# Patient Record
Sex: Male | Born: 1960 | Race: Black or African American | Hispanic: No | Marital: Single | Smoking: Current every day smoker
Health system: Southern US, Community
[De-identification: ages and names within clinical notes are randomized; demographics above are authoritative.]

## PROBLEM LIST (undated history)

## (undated) DIAGNOSIS — F1010 Alcohol abuse, uncomplicated: Secondary | ICD-10-CM

## (undated) DIAGNOSIS — K859 Acute pancreatitis without necrosis or infection, unspecified: Secondary | ICD-10-CM

## (undated) DIAGNOSIS — I729 Aneurysm of unspecified site: Secondary | ICD-10-CM

## (undated) DIAGNOSIS — I10 Essential (primary) hypertension: Secondary | ICD-10-CM

## (undated) DIAGNOSIS — K429 Umbilical hernia without obstruction or gangrene: Secondary | ICD-10-CM

## (undated) HISTORY — DX: Essential (primary) hypertension: I10

## (undated) HISTORY — DX: Aneurysm of unspecified site: I72.9

---

## 2007-08-28 ENCOUNTER — Emergency Department (HOSPITAL_COMMUNITY): Admission: EM | Admit: 2007-08-28 | Discharge: 2007-08-28 | Payer: Self-pay | Admitting: Emergency Medicine

## 2008-05-30 ENCOUNTER — Emergency Department (HOSPITAL_COMMUNITY): Admission: EM | Admit: 2008-05-30 | Discharge: 2008-05-30 | Payer: Self-pay | Admitting: Emergency Medicine

## 2012-10-06 ENCOUNTER — Ambulatory Visit (INDEPENDENT_AMBULATORY_CARE_PROVIDER_SITE_OTHER): Payer: Self-pay | Admitting: Nurse Practitioner

## 2012-10-06 ENCOUNTER — Encounter: Payer: Self-pay | Admitting: Nurse Practitioner

## 2012-10-06 VITALS — BP 170/110 | HR 80 | Temp 99.0°F | Resp 16 | Ht 69.5 in | Wt 172.0 lb

## 2012-10-06 DIAGNOSIS — I10 Essential (primary) hypertension: Secondary | ICD-10-CM

## 2012-10-06 DIAGNOSIS — R5381 Other malaise: Secondary | ICD-10-CM

## 2012-10-06 NOTE — Progress Notes (Signed)
Patient ID: Marco Pittman, male   DOB: 29-May-1960, 52 y.o.   MRN: [id number]   No Known Allergies  Chief Complaint  Patient presents with  . new patient    Patient feels hot when he eats and his nose has been bleeding    HPI: Patient is a 52 y.o. male  seen in the office today to establish care. Pt has no insurance and has no routine medical care but reports he has been feeling well recently and is now seeking medical attention.  Pt is unsure of any family medical history except for his father who died of colon cancer at age 7.  Reports he is getting really hot; today he was trying to eat a sandwich and he was sweating and could not finish eating After dinner last night he threw up; no nausea Fingers are numb; nose has been bleeding  Review of Systems:  Review of Systems  Constitutional: Positive for fever (has not taken temperture but he feels hot), chills, malaise/fatigue and diaphoresis (frequent episodes when he is outside in the heat). Negative for weight loss.  HENT: Positive for nosebleeds. Negative for ear pain, congestion, sore throat and ear discharge.   Eyes: Negative for blurred vision, pain and redness.  Respiratory: Negative for cough and shortness of breath.   Cardiovascular: Negative for chest pain, palpitations, claudication and leg swelling.  Gastrointestinal: Positive for vomiting (after meals), abdominal pain (after he eats) and diarrhea (occasional). Negative for heartburn, nausea, constipation, blood in stool and melena.  Genitourinary: Positive for urgency and frequency. Negative for dysuria.  Musculoskeletal: Positive for back pain. Negative for joint pain and falls.  Skin: Negative for itching and rash.  Neurological: Positive for tingling (numbness and tingling in fingers). Negative for dizziness, weakness and headaches.  Psychiatric/Behavioral: Negative for depression. The patient is not nervous/anxious and does not have insomnia.   ]  Past Medical  History  Diagnosis Date  . Hypertension   . Aneurysm    History reviewed. No pertinent past surgical history. Social History:   reports that he has been smoking Cigars.  He does not have any smokeless tobacco history on file. He reports that  drinks alcohol. He reports that he does not use illicit drugs.  Family History  Problem Relation Age of Onset  . Cancer Father 89    colon    Medications: Patient's Medications   No medications on file     Physical Exam:  Filed Vitals:   10/06/12 1301  BP: 170/110  Pulse: 80  Temp: 99 F (37.2 C)  TempSrc: Oral  Resp: 16  Height: 5' 9.5" (1.765 m)  Weight: 172 lb (78.019 kg)  SpO2: 99%    Physical Exam  Vitals reviewed. Constitutional: He is oriented to person, place, and time and well-developed, well-nourished, and in no distress. No distress.  HENT:  Head: Normocephalic and atraumatic.  Mouth/Throat: Oropharynx is clear and moist. No oropharyngeal exudate.  Eyes: Conjunctivae and EOM are normal. Pupils are equal, round, and reactive to light.  Neck: Normal range of motion. Neck supple. No JVD present. No tracheal deviation present. No thyromegaly present.  Cardiovascular: Normal rate, regular rhythm and normal heart sounds.   Pulmonary/Chest: Effort normal and breath sounds normal. No respiratory distress. He exhibits no tenderness.  Abdominal: Soft. Bowel sounds are normal. He exhibits no distension. There is no tenderness.  Genitourinary:  Declines rectal exam but is willing to do home FOBT  Musculoskeletal: Normal range of motion. He exhibits no  edema and no tenderness.  Lymphadenopathy:    He has no cervical adenopathy.  Neurological: He is alert and oriented to person, place, and time.  Skin: Skin is warm and dry. He is not diaphoretic.  Psychiatric: Affect normal.     Assessment/Plan   1.   Essential hypertension, benign 401.1     Blood pressure remains high on recheck- 164/ 98; EKG normal  Will get blood  work and start medication once blood work as been done   2.   Other malaise and fatigue   Encouraged a good routine; proper hydration when working in the heat  Will have pt do take home FOBT  Will get UA C&S, CBC with ciff, CMP, TSH

## 2012-10-07 LAB — COMPREHENSIVE METABOLIC PANEL
AST: 29 IU/L (ref 0–40)
Albumin/Globulin Ratio: 1.6 (ref 1.1–2.5)
Albumin: 4.6 g/dL (ref 3.5–5.5)
Alkaline Phosphatase: 64 IU/L (ref 39–117)
BUN/Creatinine Ratio: 7 — ABNORMAL LOW (ref 9–20)
BUN: 6 mg/dL (ref 6–24)
CO2: 25 mmol/L (ref 18–29)
Creatinine, Ser: 0.9 mg/dL (ref 0.76–1.27)
GFR calc Af Amer: 112 mL/min/{1.73_m2} (ref >59)
Globulin, Total: 2.8 g/dL (ref 1.5–4.5)
Sodium: 142 mmol/L (ref 134–144)
Total Bilirubin: 0.4 mg/dL (ref 0.0–1.2)

## 2012-10-07 LAB — URINALYSIS
Bilirubin, UA: NEGATIVE
Glucose, UA: NEGATIVE
Protein, UA: NEGATIVE
pH, UA: 6 (ref 5.0–7.5)

## 2012-10-07 LAB — CBC WITH DIFFERENTIAL
Eos: 0 % (ref 0–5)
HCT: 46.1 % (ref 37.5–51.0)
Immature Granulocytes: 0 % (ref 0–2)
Lymphocytes Absolute: 1.1 10*3/uL (ref 0.7–3.1)
MCH: 31.9 pg (ref 26.6–33.0)
MCV: 91 fL (ref 79–97)
Monocytes Absolute: 0.4 10*3/uL (ref 0.1–0.9)
Monocytes: 14 % — ABNORMAL HIGH (ref 4–12)
Neutrophils Absolute: 1.3 10*3/uL — ABNORMAL LOW (ref 1.4–7.0)
Platelets: 198 10*3/uL (ref 150–379)
RBC: 5.08 x10E6/uL (ref 4.14–5.80)
RDW: 14.5 % (ref 12.3–15.4)

## 2012-10-12 ENCOUNTER — Other Ambulatory Visit: Payer: Self-pay | Admitting: Nurse Practitioner

## 2012-10-12 MED ORDER — LOSARTAN POTASSIUM 50 MG PO TABS: 50.0000 mg | ORAL_TABLET | Freq: Every day | ORAL | Status: DC

## 2012-10-12 NOTE — Progress Notes (Signed)
Patient aware.

## 2012-10-14 ENCOUNTER — Other Ambulatory Visit: Payer: Self-pay

## 2012-10-14 ENCOUNTER — Other Ambulatory Visit: Payer: Self-pay | Admitting: *Deleted

## 2012-10-14 DIAGNOSIS — R5383 Other fatigue: Secondary | ICD-10-CM

## 2012-10-14 DIAGNOSIS — R5381 Other malaise: Secondary | ICD-10-CM

## 2012-10-14 DIAGNOSIS — Z800 Family history of malignant neoplasm of digestive organs: Secondary | ICD-10-CM

## 2012-10-14 DIAGNOSIS — I10 Essential (primary) hypertension: Secondary | ICD-10-CM

## 2012-10-16 LAB — FECAL OCCULT BLOOD, IMMUNOCHEMICAL: Fecal Occult Bld: NEGATIVE

## 2012-10-26 ENCOUNTER — Ambulatory Visit (INDEPENDENT_AMBULATORY_CARE_PROVIDER_SITE_OTHER): Payer: Self-pay | Admitting: Nurse Practitioner

## 2012-10-26 ENCOUNTER — Encounter: Payer: Self-pay | Admitting: Nurse Practitioner

## 2012-10-26 VITALS — BP 128/82 | HR 66 | Temp 97.4°F | Resp 18 | Ht 69.0 in | Wt 177.0 lb

## 2012-10-26 DIAGNOSIS — I10 Essential (primary) hypertension: Secondary | ICD-10-CM

## 2012-10-26 MED ORDER — LISINOPRIL 10 MG PO TABS: 10.0000 mg | ORAL_TABLET | Freq: Every day | ORAL | Status: DC

## 2012-10-26 NOTE — Patient Instructions (Addendum)
Due to cost changing to lisinopril ($4 at [facility])  Cont losartan until it is out then get lisinopril 10 mg daily   Check blood pressure- if remaining over 140/90 call us and let me know   Follow up when you get insurance ---if insurance has not went through by beginning of September please come back in for lab work

## 2012-10-26 NOTE — Progress Notes (Signed)
Patient ID: Marco Pittman, male   DOB: 1960/11/25, 52 y.o.   MRN: [id number]   No Known Allergies  Chief Complaint  Patient presents with  . Follow-up    BP check    HPI: Patient is a 52 y.o. male seen in the office today for follow up on blood pressure and fatigue. Pt reports he is doing much better since he has been on medication. Started losartan and has more energy. Currently without complaints at this time. No side effects noted from medication. 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  Eyes: Negative.   Respiratory: Negative for cough and shortness of breath.   Cardiovascular: Negative for chest pain and palpitations.  Gastrointestinal: Negative for heartburn, diarrhea and constipation.  Genitourinary: Negative for dysuria, urgency and frequency.  Musculoskeletal: Negative for myalgias and joint pain.  Skin: Negative.   Neurological: Negative for dizziness and headaches.  Psychiatric/Behavioral: Negative for depression. The patient is not nervous/anxious and does not have insomnia.      Past Medical History  Diagnosis Date  . Hypertension   . Aneurysm    History reviewed. No pertinent past surgical history. Social History:   reports that he has been smoking Cigars.  He does not have any smokeless tobacco history on file. He reports that  drinks alcohol. He reports that he does not use illicit drugs.  Family History  Problem Relation Age of Onset  . Cancer Father 52    colon    Medications: Patient's Medications  New Prescriptions   No medications on file  Previous Medications   LOSARTAN (COZAAR) 50 MG TABLET    Take 1 tablet (50 mg total) by mouth daily.  Modified Medications   No medications on file  Discontinued Medications   No medications on file     Physical Exam:  Filed Vitals:   10/26/12 0901  BP: 128/82  Pulse: 66  Temp: 97.4 F  (36.3 C)  TempSrc: Oral  Resp: 18  Height: 5\' 9"  (1.753 m)  Weight: 177 lb (80.287 kg)  SpO2: 95%    Physical Exam  Constitutional: He is oriented to person, place, and time and well-developed, well-nourished, and in no distress. No distress.  HENT:  Head: Normocephalic and atraumatic.  Eyes: Conjunctivae and EOM are normal. Pupils are equal, round, and reactive to light.  Neck: Normal range of motion. Neck supple.  Cardiovascular: Normal rate, regular rhythm and normal heart sounds.   Pulmonary/Chest: Effort normal and breath sounds normal. No respiratory distress.  Abdominal: Soft. Bowel sounds are normal. He exhibits no distension.  Musculoskeletal: Normal range of motion. He exhibits no edema and no tenderness.  Neurological: He is alert and oriented to person, place, and time.  Skin: Skin is warm and dry. He is not diaphoretic.  Psychiatric: Affect normal.     Labs reviewed: Basic Metabolic Panel:  Recent Labs  11/91/47 1452  NA 142  K 4.2  CL 102  CO2 25  GLUCOSE 87  BUN 6  CREATININE 0.90  CALCIUM 9.4  TSH 1.920   Liver Function Tests:  Recent Labs  10/06/12 1452  AST 29  ALT 21  ALKPHOS 64  BILITOT 0.4  PROT 7.4   No results found for this basename: LIPASE, AMYLASE,  in the last 8760 hours No results found for this basename: AMMONIA,  in the last 8760 hours CBC:  Recent Labs  10/06/12 1452  WBC 2.8*  NEUTROABS 1.3*  HGB 16.2  HCT 46.1  MCV 91  PLT 198     Assessment/Plan Hypertension- Improved- blood pressure log reviewed and blood pressures remaining below 140/90s. Will change medication due to cost to lisinopril 10 mg daily. He is to cont taking his blood pressure and call if blood pressure remaining high. Low sodium diet. pt to follow up when he gets insurance which he is currently in the process of getting, he will need recheck on BMP and fasting lipids.

## 2012-11-01 ENCOUNTER — Encounter: Payer: Self-pay | Admitting: Nurse Practitioner

## 2012-12-16 ENCOUNTER — Telehealth: Payer: Self-pay | Admitting: Nurse Practitioner

## 2012-12-16 NOTE — Telephone Encounter (Signed)
Marco Pittman came to the office on today with a friend.  He filled out a authorization to release information, says he had an appointment with Dr. Ewing Schlein- GI physician.  Marco Pittman says patient  requested the information and left.  We called the GI office, for fax #,  says pt does not have an appointment.  Says they told Marco Pittman, they do not take self referral,  he would need his PCP office to refer him.  I called Marco Pittman told him we are his PCP,asked if  He wanted  someone from our office to see him.  Marco Pittman gave the telephone to his friend, the friend says he does not want to come here,says  he has an appointment with another doctor.  cdavis

## 2012-12-21 ENCOUNTER — Ambulatory Visit: Payer: Self-pay | Attending: Family Medicine | Admitting: Internal Medicine

## 2012-12-21 VITALS — BP 148/112 | HR 80 | Temp 97.0°F | Resp 16 | Wt 168.0 lb

## 2012-12-21 DIAGNOSIS — R197 Diarrhea, unspecified: Secondary | ICD-10-CM | POA: Insufficient documentation

## 2012-12-21 DIAGNOSIS — R634 Abnormal weight loss: Secondary | ICD-10-CM | POA: Insufficient documentation

## 2012-12-21 DIAGNOSIS — I10 Essential (primary) hypertension: Secondary | ICD-10-CM | POA: Insufficient documentation

## 2012-12-21 DIAGNOSIS — R112 Nausea with vomiting, unspecified: Secondary | ICD-10-CM | POA: Insufficient documentation

## 2012-12-21 LAB — CBC WITH DIFFERENTIAL/PLATELET
Basophils Absolute: 0 10*3/uL (ref 0.0–0.1)
Basophils Relative: 1 % (ref 0–1)
Eosinophils Absolute: 0 10*3/uL (ref 0.0–0.7)
HCT: 42.6 % (ref 39.0–52.0)
Hemoglobin: 15.1 g/dL (ref 13.0–17.0)
MCH: 31.7 pg (ref 26.0–34.0)
MCHC: 35.4 g/dL (ref 30.0–36.0)
Monocytes Absolute: 0.5 10*3/uL (ref 0.1–1.0)
Monocytes Relative: 14 % — ABNORMAL HIGH (ref 3–12)
Neutrophils Relative %: 45 % (ref 43–77)
RDW: 14.6 % (ref 11.5–15.5)

## 2012-12-21 LAB — COMPREHENSIVE METABOLIC PANEL
ALT: 9 U/L (ref 0–53)
Albumin: 3.9 g/dL (ref 3.5–5.2)
CO2: 27 mEq/L (ref 19–32)
Chloride: 108 mEq/L (ref 96–112)
Glucose, Bld: 84 mg/dL (ref 70–99)
Potassium: 3.9 mEq/L (ref 3.5–5.3)
Sodium: 140 mEq/L (ref 135–145)
Total Bilirubin: 0.4 mg/dL (ref 0.3–1.2)
Total Protein: 6.5 g/dL (ref 6.0–8.3)

## 2012-12-21 LAB — LIPASE: Lipase: 79 U/L — ABNORMAL HIGH (ref 0–75)

## 2012-12-21 MED ORDER — PROMETHAZINE HCL 25 MG PO TABS: 25.0000 mg | ORAL_TABLET | Freq: Three times a day (TID) | ORAL | Status: DC | PRN

## 2012-12-21 NOTE — Progress Notes (Signed)
Patient states has not been feeling good for over a  Month Has been having hot and cold flashes Has lost 30 pounds in the past month Get a strange pain in his abd

## 2012-12-21 NOTE — Progress Notes (Signed)
Patient ID: Marco Pittman, male   DOB: November 12, 1960, 52 y.o.   MRN: [id number]  CC: Vomiting  HPI: Patient is 52 year old male with hypertension who presents to clinic with main concern of a few weeks duration of persistent nonbloody vomiting, poor oral intake, weight loss over 30 pounds in the past 3 months. He is not sure what exactly caused these symptoms, to him it appears that it started suddenly. He reports not being able to tolerate any by mouth intake and as soon as he then drinks water he throws up. He also reports intermittent episodes of nonbloody diarrhea. He denies any specific abdominal concerns such as abdominal distention, pain, but in stool and he also denies any specific urinary concerns. He has noted chills and night sweats but denies specific fevers. Patient also denies any known sick contacts or exposures, no recent sicknesses or hospitalizations, no traveling outside local area. Patient denies headaches, visual changes, no chest pain or shortness of breath, no specific focal neurological symptoms. Patient reports smoking over 30 years one pack per day. Patient denies alcohol and illicit drug use. He explains he was recently started on lisinopril and has been tolerating medicine well. He's not sure if there could be a side effect of this medicine that could cause the symptoms.  No Known Allergies  Past Medical History  Diagnosis Date  . Hypertension   . Aneurysm    Current Outpatient Prescriptions on File Prior to Visit  Medication Sig Dispense Refill  . lisinopril (PRINIVIL,ZESTRIL) 10 MG tablet Take 1 tablet (10 mg total) by mouth daily.  30 tablet  3   No current facility-administered medications on file prior to visit.   Family History  Problem Relation Age of Onset  . Cancer Father 25    colon   History   Social History  . Marital Status: Single    Spouse Name: N/A    Number of Children: N/A  . Years of Education: N/A   Occupational History  . Not on file.    Social History Main Topics  . Smoking status: Current Every Day Smoker -- 40 years    Types: Cigars  . Smokeless tobacco: Not on file  . Alcohol Use: 0.0 oz/week    2-3 Cans of beer per week  . Drug Use: No  . Sexual Activity: Yes   Other Topics Concern  . Not on file   Social History Narrative  . No narrative on file    Review of Systems  Constitutional: Negative for fever.  HENT: Negative for ear pain, nosebleeds, congestion, facial swelling, rhinorrhea, neck pain, neck stiffness and ear discharge.   Eyes: Negative for pain, discharge, redness, itching and visual disturbance.  Respiratory: Negative for cough, choking, chest tightness, shortness of breath, wheezing and stridor.   Cardiovascular: Negative for chest pain, palpitations and leg swelling.  Gastrointestinal: Negative for abdominal distention.  Genitourinary: Negative for dysuria, urgency, frequency, hematuria, flank pain, decreased urine volume, difficulty urinating and dyspareunia.  Musculoskeletal: Negative for back pain, joint swelling, arthralgias and gait problem.  Neurological: Negative for dizziness, tremors, seizures, syncope, facial asymmetry, speech difficulty, weakness, light-headedness, numbness and headaches.  Hematological: Negative for adenopathy. Does not bruise/bleed easily.  Psychiatric/Behavioral: Negative for hallucinations, behavioral problems, confusion, dysphoric mood, decreased concentration and agitation.    Objective:   Filed Vitals:   12/21/12 1404  BP: 148/112  Pulse: 80  Temp: 97 F (36.1 C)  Resp: 16    Physical Exam  Constitutional: Appears well-developed and  well-nourished. No distress.  HENT: Normocephalic. External right and left ear normal. Oropharynx is clear and moist.  Eyes: Conjunctivae and EOM are normal. PERRLA, no scleral icterus.  Neck: Normal ROM. Neck supple. No JVD. No tracheal deviation. No thyromegaly.  CVS: RRR, S1/S2 +, no murmurs, no gallops, no carotid  bruit.  Pulmonary: Effort and breath sounds normal, no stridor, rhonchi, wheezes, rales.  Abdominal: Soft. BS +,  no distension, tenderness, rebound or guarding.  Musculoskeletal: Normal range of motion. No edema and no tenderness.  Lymphadenopathy: No lymphadenopathy noted, cervical, inguinal. Neuro: Alert. Normal reflexes, muscle tone coordination. No cranial nerve deficit. Skin: Skin is warm and dry. No rash noted. Not diaphoretic. No erythema. No pallor.  Psychiatric: Normal mood and affect. Behavior, judgment, thought content normal.   Lab Results  Component Value Date   WBC 2.8* 10/06/2012   HGB 16.2 10/06/2012   HCT 46.1 10/06/2012   MCV 91 10/06/2012   PLT 198 10/06/2012   Lab Results  Component Value Date   CREATININE 0.90 10/06/2012   BUN 6 10/06/2012   NA 142 10/06/2012   K 4.2 10/06/2012   CL 102 10/06/2012   CO2 25 10/06/2012    No results found for this basename: HGBA1C   Lipid Panel  No results found for this basename: chol, trig, hdl, cholhdl, vldl, ldlcalc       Assessment and plan:   Patient Active Problem List   Diagnosis Date Noted  . Essential hypertension, benign 10/06/2012  - reasonable control, continuing the same regimen  Nausea and vomiting, weight loss - Unclear etiology but certainly worrisome especially given significant weight loss - Will place order for electrolyte panel, lipase, CBC, TSH - We'll also place order for CT of the abdomen and pelvis for further evaluation - Patient denies taking NSAIDs, alcohol or other illicit substance abuse, potential gastritis or gastric ulcer is not excluded - If the above tests are unremarkable will consider GI referral if the symptoms persist - We have also discussed possibility of viral gastroenteritis but the weight loss is to worrisome to consider that as the primary etiology

## 2012-12-21 NOTE — Patient Instructions (Signed)

## 2012-12-22 ENCOUNTER — Ambulatory Visit (HOSPITAL_COMMUNITY)
Admission: RE | Admit: 2012-12-22 | Discharge: 2012-12-22 | Disposition: A | Discharge status: Home / Self Care | Payer: Self-pay | Source: Ambulatory Visit | Attending: Internal Medicine | Admitting: Internal Medicine

## 2012-12-22 DIAGNOSIS — R1033 Periumbilical pain: Secondary | ICD-10-CM | POA: Insufficient documentation

## 2012-12-22 DIAGNOSIS — M47817 Spondylosis without myelopathy or radiculopathy, lumbosacral region: Secondary | ICD-10-CM | POA: Insufficient documentation

## 2012-12-22 DIAGNOSIS — K5730 Diverticulosis of large intestine without perforation or abscess without bleeding: Secondary | ICD-10-CM | POA: Insufficient documentation

## 2012-12-22 DIAGNOSIS — R634 Abnormal weight loss: Secondary | ICD-10-CM

## 2012-12-22 MED ORDER — IOHEXOL 300 MG/ML  SOLN
80.0000 mL | Freq: Once | INTRAMUSCULAR | Status: AC | PRN
  Administered 2012-12-22: 80 mL via INTRAVENOUS

## 2013-01-04 ENCOUNTER — Ambulatory Visit: Payer: Self-pay

## 2013-02-01 ENCOUNTER — Ambulatory Visit: Payer: Self-pay | Attending: Internal Medicine | Admitting: Internal Medicine

## 2013-02-01 VITALS — BP 114/73 | HR 80 | Temp 99.1°F | Resp 16 | Ht 69.5 in | Wt 169.0 lb

## 2013-02-01 DIAGNOSIS — R112 Nausea with vomiting, unspecified: Secondary | ICD-10-CM

## 2013-02-01 DIAGNOSIS — I10 Essential (primary) hypertension: Secondary | ICD-10-CM | POA: Insufficient documentation

## 2013-02-01 NOTE — Progress Notes (Signed)
Pt is here to follow up on his HTN. Pt is requesting to review his MRI results.

## 2013-02-01 NOTE — Patient Instructions (Signed)

## 2013-02-01 NOTE — Progress Notes (Signed)
Patient ID: Marco Pittman, male   DOB: 04-08-1960, 52 y.o.   MRN: [id number]  CC: follow up  HPI: 52 year old male with past medical history of hypertension who presented to clinic for follow up. He feels better today but still reports occasional nausea and vomiting. He has no abdominal pain at this time.  No Known Allergies Past Medical History  Diagnosis Date  . Hypertension   . Aneurysm    Current Outpatient Prescriptions on File Prior to Visit  Medication Sig Dispense Refill  . lisinopril (PRINIVIL,ZESTRIL) 10 MG tablet Take 1 tablet (10 mg total) by mouth daily.  30 tablet  3  . losartan (COZAAR) 50 MG tablet Take 50 mg by mouth daily.      . promethazine (PHENERGAN) 25 MG tablet Take 1 tablet (25 mg total) by mouth every 8 (eight) hours as needed for nausea.  65 tablet  1   No current facility-administered medications on file prior to visit.   Family History  Problem Relation Age of Onset  . Cancer Father 85    colon   History   Social History  . Marital Status: Single    Spouse Name: N/A    Number of Children: N/A  . Years of Education: N/A   Occupational History  . Not on file.   Social History Main Topics  . Smoking status: Current Every Day Smoker -- 40 years    Types: Cigars  . Smokeless tobacco: Not on file  . Alcohol Use: 0.0 oz/week    2-3 Cans of beer per week  . Drug Use: No  . Sexual Activity: Yes   Other Topics Concern  . Not on file   Social History Narrative  . No narrative on file    Review of Systems  Constitutional: Negative for fever, chills, diaphoresis, activity change, appetite change and fatigue.  HENT: Negative for ear pain, nosebleeds, congestion, facial swelling, rhinorrhea, neck pain, neck stiffness and ear discharge.   Eyes: Negative for pain, discharge, redness, itching and visual disturbance.  Respiratory: Negative for cough, choking, chest tightness, shortness of breath, wheezing and stridor.   Cardiovascular: Negative  for chest pain, palpitations and leg swelling.  Gastrointestinal: Negative for abdominal distention.  Genitourinary: Negative for dysuria, urgency, frequency, hematuria, flank pain, decreased urine volume, difficulty urinating and dyspareunia.  Musculoskeletal: Negative for back pain, joint swelling, arthralgias and gait problem.  Neurological: Negative for dizziness, tremors, seizures, syncope, facial asymmetry, speech difficulty, weakness, light-headedness, numbness and headaches.  Hematological: Negative for adenopathy. Does not bruise/bleed easily.  Psychiatric/Behavioral: Negative for hallucinations, behavioral problems, confusion, dysphoric mood, decreased concentration and agitation.    Objective:  There were no vitals filed for this visit.  Physical Exam  Constitutional: Appears well-developed and well-nourished. No distress.  HENT: Normocephalic. External right and left ear normal. Oropharynx is clear and moist.  Eyes: Conjunctivae and EOM are normal. PERRLA, no scleral icterus.  Neck: Normal ROM. Neck supple. No JVD. No tracheal deviation. No thyromegaly.  CVS: RRR, S1/S2 +, no murmurs, no gallops, no carotid bruit.  Pulmonary: Effort and breath sounds normal, no stridor, rhonchi, wheezes, rales.  Abdominal: Soft. BS +,  no distension, tenderness, rebound or guarding.  Musculoskeletal: Normal range of motion. No edema and no tenderness.  Lymphadenopathy: No lymphadenopathy noted, cervical, inguinal. Neuro: Alert. Normal reflexes, muscle tone coordination. No cranial nerve deficit. Skin: Skin is warm and dry. No rash noted. Not diaphoretic. No erythema. No pallor.  Psychiatric: Normal mood and affect.  Behavior, judgment, thought content normal.   Lab Results  Component Value Date   WBC 4.0 12/21/2012   HGB 15.1 12/21/2012   HCT 42.6 12/21/2012   MCV 89.5 12/21/2012   PLT 255 12/21/2012   Lab Results  Component Value Date   CREATININE 0.85 12/21/2012   BUN 7 12/21/2012   NA 140  12/21/2012   K 3.9 12/21/2012   CL 108 12/21/2012   CO2 27 12/21/2012    No results found for this basename: HGBA1C   Lipid Panel  No results found for this basename: chol, trig, hdl, cholhdl, vldl, ldlcalc       Assessment and plan:   Patient Active Problem List   Diagnosis Date Noted  . Essential hypertension, benign 10/06/2012    Priority: Medium - We have discussed target BP range - I have advised pt to check BP regularly and to call us back if the numbers are higher than 140/90 - discussed the importance of compliance with medical therapy and diet  - continue current medications and will see the pt in 1 month and if SBP above 135 will increase the dosages      Nausea or vomiting - phenergan PRN - GI referral provided

## 2013-02-10 ENCOUNTER — Other Ambulatory Visit: Payer: Self-pay | Admitting: Nurse Practitioner

## 2015-11-14 ENCOUNTER — Emergency Department (HOSPITAL_COMMUNITY)
Admission: EM | Admit: 2015-11-14 | Discharge: 2015-11-14 | Disposition: A | Discharge status: Home / Self Care | Payer: Medicaid Other | Attending: Emergency Medicine | Admitting: Emergency Medicine

## 2015-11-14 ENCOUNTER — Encounter (HOSPITAL_COMMUNITY): Payer: Self-pay | Admitting: Emergency Medicine

## 2015-11-14 DIAGNOSIS — I10 Essential (primary) hypertension: Secondary | ICD-10-CM | POA: Diagnosis not present

## 2015-11-14 DIAGNOSIS — R1033 Periumbilical pain: Secondary | ICD-10-CM | POA: Diagnosis present

## 2015-11-14 DIAGNOSIS — F17210 Nicotine dependence, cigarettes, uncomplicated: Secondary | ICD-10-CM | POA: Diagnosis not present

## 2015-11-14 DIAGNOSIS — K429 Umbilical hernia without obstruction or gangrene: Secondary | ICD-10-CM | POA: Diagnosis not present

## 2015-11-14 NOTE — ED Provider Notes (Signed)
[facility]-EMERGENCY DEPT Provider Note   CSN: [id number] Arrival date & time: 11/14/15  [id number]  First Provider Contact:  First MD Initiated Contact with Patient 11/14/15 [phone]        History   Chief Complaint Chief Complaint  Patient presents with  . Abdominal Pain    HPI Marco Pittman is a 55 y.o. male.  HPI Patient presents to the emergency department with complaints of intermittent periumbilical abdominal pain that lasts for 2 or 3 days and then resolve spontaneously on its own.  This is been occurring for several years.  He does report as a child he had a large umbilical hernia that was never repaired.  He reports that when his pain comes on it becomes nauseated and vomits and has no bowel movements and then usually 2 or 3 days of lying on his back and holding his stomach results in resolution of his symptoms.  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  He had a CT scan performed in 2014 which shows some abdominal wall diastases around his periumbilical region.  At that time there is no aneurysm of his aorta present   Past Medical History:  Diagnosis Date  . Aneurysm (HCC)   . Hypertension     Patient Active Problem List   Diagnosis Date Noted  . Nausea with vomiting 02/01/2013  . Essential hypertension, benign 10/06/2012    History reviewed. No pertinent surgical history.     Home Medications    Prior to Admission medications   Medication Sig Start Date End Date Taking? Authorizing Provider  losartan (COZAAR) 50 MG tablet Take 50 mg by mouth daily.    Historical Provider, MD    Family History Family History  Problem Relation Age of Onset  . Cancer Father [id number]    colon    Social History Social History  Substance Use Topics  . Smoking status: Current Every Day Smoker    Years: 40.00    Types: Cigars  . Smokeless tobacco: Never Used  . Alcohol use 0.0  oz/week    2 - 3 Cans of beer per week     Allergies   Review of patient's allergies indicates no known allergies.   Review of Systems Review of Systems  All other systems reviewed and are negative.    Physical Exam Updated Vital Signs BP 136/82 (BP Location: Right Arm)   Pulse 74   Temp 98.7 F (37.1 C) (Oral)   Resp 16   Ht 5\' 9"  (1.753 m)   Wt 165 lb (74.8 kg)   SpO2 98%   BMI 24.37 kg/m   Physical Exam  Constitutional: He is oriented to person, place, and time. He appears well-developed and well-nourished.  HENT:  Head: Normocephalic.  Eyes: EOM are normal.  Neck: Normal range of motion.  Pulmonary/Chest: Effort normal.  Abdominal: He exhibits no distension.  Easily reducible umbilical hernia  Musculoskeletal: Normal range of motion.  Neurological: He is alert and oriented to person, place, and time.  Psychiatric: He has a normal mood and affect.  Nursing note and vitals reviewed.    ED Treatments / Results  Labs (all labs ordered are listed, but only abnormal results are displayed) Labs Reviewed - No data to display  EKG  EKG Interpretation None       Radiology No results found.  Procedures Procedures (including critical care time)  Medications Ordered in ED Medications - No data to display   Initial Impression / Assessment and Plan / ED Course  I have reviewed the triage vital signs and the nursing notes.  Pertinent labs & imaging results that were available during my care of the patient were reviewed by me and considered in my medical decision making (see chart for details).  Clinical Course    Easily reducible umbilical hernia.  Asymptomatic at this time.  His last symptoms of abdominal pain for 2 days ago.  He'll be referred to [facility] surgery for possible repair of his umbilical hernia.  He understands return to the ER for new or worsening symptoms  Final Clinical Impressions(s) / ED Diagnoses   Final diagnoses:    Recurrent umbilical hernia    New Prescriptions New Prescriptions   No medications on file     Azalia Bilis, MD 11/14/15 1019

## 2015-11-14 NOTE — ED Notes (Signed)
Pt states he ate bologna this am for breakfast.

## 2015-11-14 NOTE — ED Triage Notes (Signed)
Pt c/o pain around umbilicus-- intermittent-- started last week and has been constant-- last bowel movement yesterday- no vomiting.

## 2016-03-04 ENCOUNTER — Inpatient Hospital Stay (HOSPITAL_COMMUNITY)
Admission: EM | Admit: 2016-03-04 | Discharge: 2016-03-05 | DRG: 438 | Disposition: A | Discharge status: Home / Self Care | Payer: Medicaid Other | Attending: Oncology | Admitting: Oncology

## 2016-03-04 ENCOUNTER — Encounter: Payer: Self-pay | Admitting: General Surgery

## 2016-03-04 ENCOUNTER — Encounter (HOSPITAL_COMMUNITY): Payer: Self-pay | Admitting: *Deleted

## 2016-03-04 ENCOUNTER — Emergency Department (HOSPITAL_COMMUNITY): Payer: Medicaid Other

## 2016-03-04 ENCOUNTER — Inpatient Hospital Stay (HOSPITAL_COMMUNITY): Payer: Medicaid Other

## 2016-03-04 DIAGNOSIS — F1020 Alcohol dependence, uncomplicated: Secondary | ICD-10-CM

## 2016-03-04 DIAGNOSIS — K859 Acute pancreatitis without necrosis or infection, unspecified: Secondary | ICD-10-CM

## 2016-03-04 DIAGNOSIS — K861 Other chronic pancreatitis: Secondary | ICD-10-CM | POA: Diagnosis present

## 2016-03-04 DIAGNOSIS — F17290 Nicotine dependence, other tobacco product, uncomplicated: Secondary | ICD-10-CM | POA: Diagnosis present

## 2016-03-04 DIAGNOSIS — I10 Essential (primary) hypertension: Secondary | ICD-10-CM | POA: Diagnosis present

## 2016-03-04 DIAGNOSIS — K852 Alcohol induced acute pancreatitis without necrosis or infection: Secondary | ICD-10-CM | POA: Diagnosis not present

## 2016-03-04 DIAGNOSIS — E43 Unspecified severe protein-calorie malnutrition: Secondary | ICD-10-CM | POA: Insufficient documentation

## 2016-03-04 DIAGNOSIS — Z6823 Body mass index (BMI) 23.0-23.9, adult: Secondary | ICD-10-CM | POA: Diagnosis not present

## 2016-03-04 DIAGNOSIS — Z79899 Other long term (current) drug therapy: Secondary | ICD-10-CM

## 2016-03-04 DIAGNOSIS — K8681 Exocrine pancreatic insufficiency: Secondary | ICD-10-CM | POA: Diagnosis not present

## 2016-03-04 DIAGNOSIS — F1010 Alcohol abuse, uncomplicated: Secondary | ICD-10-CM | POA: Diagnosis present

## 2016-03-04 DIAGNOSIS — F17210 Nicotine dependence, cigarettes, uncomplicated: Secondary | ICD-10-CM

## 2016-03-04 DIAGNOSIS — K860 Alcohol-induced chronic pancreatitis: Secondary | ICD-10-CM

## 2016-03-04 DIAGNOSIS — Z800 Family history of malignant neoplasm of digestive organs: Secondary | ICD-10-CM

## 2016-03-04 DIAGNOSIS — K429 Umbilical hernia without obstruction or gangrene: Secondary | ICD-10-CM | POA: Diagnosis present

## 2016-03-04 HISTORY — DX: Acute pancreatitis without necrosis or infection, unspecified: K85.90

## 2016-03-04 HISTORY — DX: Umbilical hernia without obstruction or gangrene: K42.9

## 2016-03-04 LAB — URINALYSIS, ROUTINE W REFLEX MICROSCOPIC
Glucose, UA: 100 mg/dL — AB
HGB URINE DIPSTICK: NEGATIVE
KETONES UR: 15 mg/dL — AB
NITRITE: POSITIVE — AB
PH: 5.5 (ref 5.0–8.0)
Protein, ur: 30 mg/dL — AB
Specific Gravity, Urine: 1.024 (ref 1.005–1.030)

## 2016-03-04 LAB — COMPREHENSIVE METABOLIC PANEL
ALBUMIN: 4.1 g/dL (ref 3.5–5.0)
ALK PHOS: 90 U/L (ref 38–126)
ALT: 15 U/L — ABNORMAL LOW (ref 17–63)
AST: 19 U/L (ref 15–41)
Anion gap: 10 (ref 5–15)
BILIRUBIN TOTAL: 0.6 mg/dL (ref 0.3–1.2)
BUN: 9 mg/dL (ref 6–20)
CALCIUM: 9.8 mg/dL (ref 8.9–10.3)
CO2: 23 mmol/L (ref 22–32)
Chloride: 104 mmol/L (ref 101–111)
Creatinine, Ser: 0.93 mg/dL (ref 0.61–1.24)
GFR calc Af Amer: >60 mL/min (ref >60)
GFR calc non Af Amer: >60 mL/min (ref >60)
GLUCOSE: 127 mg/dL — AB (ref 65–99)
POTASSIUM: 4.3 mmol/L (ref 3.5–5.1)
Sodium: 137 mmol/L (ref 135–145)
TOTAL PROTEIN: 7.8 g/dL (ref 6.5–8.1)

## 2016-03-04 LAB — CBC
HEMATOCRIT: 50.2 % (ref 39.0–52.0)
Hemoglobin: 18.3 g/dL — ABNORMAL HIGH (ref 13.0–17.0)
MCH: 31.7 pg (ref 26.0–34.0)
MCHC: 36.5 g/dL — AB (ref 30.0–36.0)
MCV: 86.9 fL (ref 78.0–100.0)
Platelets: 160 10*3/uL (ref 150–400)
RBC: 5.78 MIL/uL (ref 4.22–5.81)
RDW: 13.4 % (ref 11.5–15.5)
WBC: 5.5 10*3/uL (ref 4.0–10.5)

## 2016-03-04 LAB — URINE MICROSCOPIC-ADD ON

## 2016-03-04 LAB — ETHANOL: Alcohol, Ethyl (B): <5 mg/dL (ref <5)

## 2016-03-04 LAB — TSH: TSH: 1.806 u[IU]/mL (ref 0.350–4.500)

## 2016-03-04 LAB — LIPASE, BLOOD: Lipase: 472 U/L — ABNORMAL HIGH (ref 11–51)

## 2016-03-04 MED ORDER — ONDANSETRON HCL 4 MG/2ML IJ SOLN
4.0000 mg | Freq: Four times a day (QID) | INTRAMUSCULAR | Status: DC | PRN
  Administered 2016-03-04 – 2016-03-05 (×2): 4 mg via INTRAVENOUS
  Filled 2016-03-04 (×2): qty 2

## 2016-03-04 MED ORDER — DOCUSATE SODIUM 100 MG PO CAPS
100.0000 mg | ORAL_CAPSULE | Freq: Two times a day (BID) | ORAL | Status: DC
  Administered 2016-03-04 – 2016-03-05 (×2): 100 mg via ORAL
  Filled 2016-03-04 (×2): qty 1

## 2016-03-04 MED ORDER — MORPHINE SULFATE (PF) 4 MG/ML IV SOLN
4.0000 mg | Freq: Once | INTRAVENOUS | Status: AC
  Administered 2016-03-04: 4 mg via INTRAVENOUS
  Filled 2016-03-04: qty 1

## 2016-03-04 MED ORDER — ACETAMINOPHEN 325 MG PO TABS: 650.0000 mg | ORAL_TABLET | Freq: Four times a day (QID) | ORAL | Status: DC | PRN

## 2016-03-04 MED ORDER — LACTATED RINGERS IV SOLN
INTRAVENOUS | Status: DC
  Administered 2016-03-04: 1000 mL via INTRAVENOUS

## 2016-03-04 MED ORDER — ONDANSETRON HCL 4 MG PO TABS
4.0000 mg | ORAL_TABLET | Freq: Four times a day (QID) | ORAL | Status: DC | PRN
Start: 2016-03-04 — End: 2016-03-05

## 2016-03-04 MED ORDER — ACETAMINOPHEN 650 MG RE SUPP: 650.0000 mg | Freq: Four times a day (QID) | RECTAL | Status: DC | PRN

## 2016-03-04 MED ORDER — SODIUM CHLORIDE 0.9 % IV BOLUS (SEPSIS)
1000.0000 mL | Freq: Once | INTRAVENOUS | Status: AC
  Administered 2016-03-04: 1000 mL via INTRAVENOUS

## 2016-03-04 MED ORDER — HYDROMORPHONE HCL 1 MG/ML IJ SOLN
1.0000 mg | INTRAMUSCULAR | Status: DC | PRN
  Administered 2016-03-04 – 2016-03-05 (×2): 1 mg via INTRAVENOUS
  Filled 2016-03-04 (×2): qty 1

## 2016-03-04 MED ORDER — SODIUM CHLORIDE 0.9% FLUSH
3.0000 mL | Freq: Two times a day (BID) | INTRAVENOUS | Status: DC
  Administered 2016-03-04 – 2016-03-05 (×2): 3 mL via INTRAVENOUS

## 2016-03-04 MED ORDER — HEPARIN SODIUM (PORCINE) 5000 UNIT/ML IJ SOLN
5000.0000 [IU] | Freq: Three times a day (TID) | INTRAMUSCULAR | Status: DC
  Administered 2016-03-04 – 2016-03-05 (×2): 5000 [IU] via SUBCUTANEOUS
  Filled 2016-03-04: qty 1

## 2016-03-04 MED ORDER — ONDANSETRON HCL 4 MG/2ML IJ SOLN: 4.0000 mg | Freq: Three times a day (TID) | INTRAMUSCULAR | Status: DC | PRN

## 2016-03-04 MED ORDER — ONDANSETRON HCL 4 MG/2ML IJ SOLN
4.0000 mg | Freq: Once | INTRAMUSCULAR | Status: AC
  Administered 2016-03-04: 4 mg via INTRAVENOUS
  Filled 2016-03-04: qty 2

## 2016-03-04 MED ORDER — IOPAMIDOL (ISOVUE-300) INJECTION 61%
INTRAVENOUS | Status: AC
  Administered 2016-03-04: 100 mL
  Filled 2016-03-04: qty 100

## 2016-03-04 NOTE — H&P (Signed)
Date: 03/04/2016               Patient Name:  Marco Pittman MRN: [id number]  DOB: 10/12/1960 Age / Sex: 55 y.o., male   PCP: Quentin Angstlugbemiga E Jegede, MD              Medical Service: Internal Medicine Teaching Service              Attending Physician: Dr. Marily MemosJason Mesner, MD    First Contact: Dannial MonarchEJ Sisto Granillo, MS 3 Pager: [phone]  Second Contact: Dr. Laural BenesJohnson Pager: [phone]  Third Contact Dr. Johnny BridgeSaraiya Pager: [phone]       After Hours (After 5p/  First Contact Pager: [phone]  weekends / holidays): Second Contact Pager: [phone]   Chief Complaint: "abdominal pain"  History of Present Illness: Marco Pittman, Marco Pittman is 55 yo M with a PMH ETOH use and HTN presented to the ED with abdominal pain and vomiting for the past 4 days. The patient was in his usual health until 4 days ago when he started vomiting. The patient denies any blood or red vomit and states it is usually white. He has not been able to put any food or drink down since then. The patient endorses new abdominal pain around his umbilicus and in the lower regions of his belly. He says the pain is fluctuant but constant and not associated with eating. Currently he is at 0/10 pain after the pain medication. He denies any radiation or movement of the pain and says it is not worsened with movement. He denies any chest pain or back pain. He says this occurred once before where he was hospitalized in the 1970s. He said it was due to his hernia. Since then he has had little pain, except over the past months where he would experience pain now and then for about 2 days before it subsided. The patient says he has had less of an appetite recently and endorsed a recent weight loss. He also states he has less muscle tone then he used to. The patient denies any dysuria or changes in his bowels. But states his urine has been orange for the past few days. The patient also denies any recent illness but has had fevers and chills for this time period as well. The patient  denied any recent trauma, surgery, history of ulcer disease or gallstones. He endorses using tylenol occasionally for shoulder pain. He states he drinks about 2 24 ounce beers a day and smokes 3 cigars per week. He has been generally healthy and was only diagnosed with HTN which he hadn't been taking his medications for. He has no history of atherosclerotic disease but says he brother has had an MI. His father and his cousin had colon cancer. No colonoscopy hx. He has no family history of autoimmune disease.   In the Ed, the patient had a HR 69, BP 138/99, T98.4, Rr16 and O2 99%RA. Lipase was elevated to 472. CTA showed acute on chronic pancreatitis. He received 1 L Ns, ondansetron and morphine and was transferred to the teaching service.   Meds: No current facility-administered medications for this encounter.    Current Outpatient Prescriptions  Medication Sig Dispense Refill  . acetaminophen (TYLENOL) 325 MG tablet Take 650 mg by mouth every 6 (six) hours as needed for mild pain.      Allergies: Allergies as of 03/04/2016  . (No Known Allergies)   Past Medical History:  Diagnosis Date  . Aneurysm (HCC)   . Hypertension  History reviewed. No pertinent surgical history. Family History  Problem Relation Age of Onset  . Cancer Father 4562    colon   Social History   Social History  . Marital status: Single    Spouse name: N/A  . Number of children: N/A  . Years of education: N/A   Occupational History  . Not on file.   Social History Main Topics  . Smoking status: Current Every Day Smoker    Years: 40.00    Types: Cigars  . Smokeless tobacco: Never Used  . Alcohol use 0.0 oz/week    2 - 3 Cans of beer per week  . Drug use: No  . Sexual activity: Yes   Other Topics Concern  . Not on file   Social History Narrative  . No narrative on file    Review of Systems: Constitutional: positive for fatigue, fever chills and weight loss  Ears, nose, mouth, throat, and face:  negative for changes in vision or congestion or sore throat Respiratory: negative for SOB or cough Cardiovascular: negative for chest pain or palpitations Gastrointestinal: Per HPI Genitourinary:Negative for dysuria Hematologic/lymphatic: Denies easy brusising Musculoskeletal:Positive for joint pain of the left shoulder. Denies any other pains Neurological: Negative for headaches Endocrine: positive for weight change Allergic/Immunologic: Negative for allergies or autoimmune diseases  Physical Exam: Blood pressure 145/98, pulse 76, temperature 98 F (36.7 C), resp. rate 18, height 5' 9.5" (1.765 m), weight 70.4 kg (155 lb 5 oz), SpO2 99 %. Gen: Patient is well appearing, lying in bed, fully conversant with team. HEENT: normocephalic and atraumatic. Pupils are equally round and reactive to light. Moist mucus membranes. Anicteric. No oral lesions present. Tongue is normal size with no lesions. Tyroid is unilateral to the right side.  Lymphatics: no lymphadenopathy present posterior cervical, posterior auricular, occipital, periauricular, parotid submental or submandibular. No axillary, supraclavicular or inguinal lymphadenopathy present.  Cv: No sternal scars present. Regular Rate and rhythm. NL s1 s2 with no heaves rubs or murmurs.  Vascular:. Radial, dorsalis pedis and posterior tibial pulses 2+.  Resp: No scars present on back. Normal work of breathing. Clear to auscultation bilaterally. Tympanic percussion throughout.  Abdomen: No abdominal scars present. Umbilical hernia protruding 1 cm. Non distended. Decreased bowel sounds. Tender to palpitation in all four quadrants. Worse with rebound. Positive McMurphy's sign. No cullen's sign. Symmetric percussion throughout. No organomegaly or masses.  Neuro: Oriented x3. Normal visual acuity. EOM intact. Facial motor symmetry. Facial sensation is symmetric. Uvula and tongue are midline. Grossly normal strength throughout.  Psych: normal mood and  affect.  Ext: Warm extremities. No LE edema. No cyanosis or clubbing. No rashes present. MSK: No obvious deformed joints.  Lab results: CBC wnl. CMP wnl. Lipase 472. UA positive for hyaline casts, nitrites and glucose.   Imaging results:  CTA - Changes consistent with acute on chronic pancreatitis with evidence of pancreatic ductal dilatation likely mass-effect upon the distal common bile duct with biliary ductal dilatation. Considerable peripancreatic inflammatory changes noted without definitive Pseudocyst. See imaging note for formal report.    Assessment & Plan by Problem: Active Problems:   Pancreatitis  Marco Pittman, Jamee is 55 yo M with a PMH ETOH use and HTN presented to the ED with abdominal pain and vomiting most likely due to interstitial edematous pancreatitis superimposed on chronic pancreatits. The etiology of this is most likely 2/2 to ETOH or idiopathic. Ischemia seems unlikely without other evidence of atherosclerosis. Gallstone is unlikely without the history of prandial pain  or cholelithiasis on CTA. There is evidence of bilary ductal dilatation most likely 2/2 to the pancreas. Although the patient's history of weight loss, decreased appetite and decreased muscle tone is concerning for malignancy in the setting of a a positive family history of colon cancer, it is unlikely without a lesion on Ct. Hypertriglyceridemia may be a cause and can be ruled out with a lipid panel. HIV will also be sought after given the positive sexual history. It is unlikely appendicitis without a fever or leukocytosis or moving pain and negative CT. Unlikely PUD or gastritis with no relation to foods. Unlikely lower GI with normal bowel movements.   Pancreatitis The patient's vomiting, abdominal pain elevated lipase and CTA showing peripancreatic inflammation along with scattered calcification is most consistent with interstitial edematous pancreatitis. Although the patient exhibits rebound  tenderness, the patient remains afebrile and no leukocytosis. This presentation of pancreatitis is mild given the BUN of 9 and ranson criteria of 1 or 0-3% mortality, mild pain, no SOB or brusing of the abdomen or crackles of the lungs on exam. Given the mild presentation, the patient should be advanced diet as tolerated with appropriate pain control. Urine sed showing hylain casts and hemoconcentration suggest dehydration which will be addressed with hydration.    - hydromorphine 1 mg q4  - mIVF LR  - Advance to clear liquids tomorrow AM  - CBC qd  - CMP qd  - HIV elisa  - Lipid panel  - ethanol and TSH   HTN Not currently taking medication. Will monitor   - Vitals qshift  FENGI  - Liquids in AM  - Ondansetron 4mg  q6 PRN  - colace 100 mg BID PRN DVT prophylaxis: Heparin SQ  This is a Psychologist, occupational Note.  The care of the patient was discussed with Dr. Laural Benes and the assessment and plan was formulated with their assistance.  Please see their note for official documentation of the patient encounter.   Signed: Sharin Mons, Medical Student 03/04/2016, 6:00 PM

## 2016-03-04 NOTE — Progress Notes (Signed)
Admission note:  Arrival Method: Patient arrived from ED on stretcher accompanied by staff. Mental Orientation:  Alert and oriented x 4.  Telemetry: 6E-25, NSR, CCMD notified and verified by second staff member. Skin: Dry, intact and warm, assessed by two nurses Dessie Coma(Kim Hayes). IV: Left forearm saline lock. Pain: Denies any pain currently. Safety Measures: Bed in low position, call bell and phone within reach. Fall Prevention Safety Plan: Reviewed the plan, understood and acknowledged. Admission Screening: In progress. 6700 Orientation: Patient has been oriented to the unit, staff and to the room.

## 2016-03-04 NOTE — ED Notes (Signed)
Attempted report. RN to call back 

## 2016-03-04 NOTE — ED Provider Notes (Signed)
[facility]-EMERGENCY DEPT Provider Note   CSN: [id number] Arrival date & time: 03/04/16  1207     History   Chief Complaint Chief Complaint  Patient presents with  . Emesis  . Diarrhea    HPI Marco Pittman is a 55 y.o. male.  HPI   Pt with hx HTN p/w abdominal pain, N/V, early satiety x years, weight loss of 20 pounds over 2 weeks.  Pt states he has not eaten anything in 4 days, everything comes back up.  Is having normal bowel movements.   Pt was seen by Dr Abbey Chattersosenbower today for evaluation for umbilical hernia surgery.  Pt was under the impression that the hernia was causing his symptoms though hernia is small and easily reducible - Dr Abbey Chattersosenbower sent to ED for further evaluation.   Denies urinary symptoms.  No hx abdominal surgeries.  He has no PCP.  Drinks beer occasionally, last 3 days ago.    Past Medical History:  Diagnosis Date  . Aneurysm (HCC)   . Hypertension     Patient Active Problem List   Diagnosis Date Noted  . Pancreatitis 03/04/2016  . Nausea with vomiting 02/01/2013  . Essential hypertension, benign 10/06/2012    History reviewed. No pertinent surgical history.     Home Medications    Prior to Admission medications   Medication Sig Start Date End Date Taking? Authorizing Provider  acetaminophen (TYLENOL) 325 MG tablet Take 650 mg by mouth every 6 (six) hours as needed for mild pain.   Yes Historical Provider, MD    Family History Family History  Problem Relation Age of Onset  . Cancer Father 4362    colon    Social History Social History  Substance Use Topics  . Smoking status: Current Every Day Smoker    Years: 40.00    Types: Cigars  . Smokeless tobacco: Never Used  . Alcohol use 0.0 oz/week    2 - 3 Cans of beer per week     Allergies   Patient has no known allergies.   Review of Systems Review of Systems  All other systems reviewed and are negative.    Physical Exam Updated Vital Signs BP 138/99   Pulse 69   Temp  98.4 F (36.9 C) (Oral)   Resp 16   Ht 5' 9.5" (1.765 m)   Wt 70.4 kg   SpO2 99%   BMI 22.61 kg/m   Physical Exam  Constitutional: He appears well-developed and well-nourished. No distress.  HENT:  Head: Normocephalic and atraumatic.  Neck: Neck supple.  Cardiovascular: Normal rate and regular rhythm.   Pulmonary/Chest: Effort normal and breath sounds normal. No respiratory distress. He has no wheezes. He has no rales.  Abdominal: Soft. He exhibits no distension and no mass. There is tenderness (diffuse ). There is no rebound and no guarding.  Neurological: He is alert. He exhibits normal muscle tone.  Skin: He is not diaphoretic.  Nursing note and vitals reviewed.    ED Treatments / Results  Labs (all labs ordered are listed, but only abnormal results are displayed) Labs Reviewed  LIPASE, BLOOD - Abnormal; Notable for the following:       Result Value   Lipase 472 (*)    All other components within normal limits  COMPREHENSIVE METABOLIC PANEL - Abnormal; Notable for the following:    Glucose, Bld 127 (*)    ALT 15 (*)    All other components within normal limits  CBC - Abnormal; Notable  for the following:    Hemoglobin 18.3 (*)    MCHC 36.5 (*)    All other components within normal limits  URINALYSIS, ROUTINE W REFLEX MICROSCOPIC (NOT AT [facility]) - Abnormal; Notable for the following:    Color, Urine AMBER (*)    APPearance HAZY (*)    Glucose, UA 100 (*)    Bilirubin Urine SMALL (*)    Ketones, ur 15 (*)    Protein, ur 30 (*)    Nitrite POSITIVE (*)    Leukocytes, UA SMALL (*)    All other components within normal limits  URINE MICROSCOPIC-ADD ON - Abnormal; Notable for the following:    Squamous Epithelial / LPF 0-5 (*)    Bacteria, UA RARE (*)    Casts HYALINE CASTS (*)    All other components within normal limits    EKG  EKG Interpretation None       Radiology Ct Abdomen Pelvis W Contrast  Result Date: 03/04/2016 CLINICAL DATA:  Periumbilical pain  for 1 month and elevated lipase EXAM: CT ABDOMEN AND PELVIS WITH CONTRAST TECHNIQUE: Multidetector CT imaging of the abdomen and pelvis was performed using the standard protocol following bolus administration of intravenous contrast. CONTRAST:  ISOVUE-300 IOPAMIDOL (ISOVUE-300) INJECTION 61% COMPARISON:  12/22/2012 FINDINGS: Lower chest: No acute abnormality. Hepatobiliary: No focal mass is noted within the liver. The gallbladder is well distended. There is evidence of intrahepatic and extrahepatic biliary ductal dilatation extending to the level of the head of the pancreas. Pancreas: Scattered calcifications are noted within the head and uncinate process of the pancreas as well as throughout the body and tail consistent with chronic pancreatitis. Diffuse pancreatic ductal dilatation is seen new from the prior exam. There is also significant peripancreatic inflammatory change without focal pseudocyst or significant phlegmon. Delayed images demonstrate no significant abnormal enhancement to suggest pancreatic necrosis at this time. Spleen: Normal in size without focal abnormality. Adrenals/Urinary Tract: Adrenal glands are unremarkable. Kidneys are normal, without renal calculi, focal lesion, or hydronephrosis. Bladder is unremarkable. Stomach/Bowel: Diverticulosis without evidence of diverticulitis is noted. The appendix is within normal limits. No obstructive changes are seen. Vascular/Lymphatic: Aortic atherosclerosis. No enlarged abdominal or pelvic lymph nodes. Reproductive: Prostate is unremarkable. Other: Minimal free pelvic fluid is noted Musculoskeletal: Degenerative changes of the lumbar spine are noted. No acute bony abnormality is seen. IMPRESSION: Changes consistent with acute on chronic pancreatitis with evidence of pancreatic ductal dilatation likely mass-effect upon the distal common bile duct with biliary ductal dilatation. Considerable peripancreatic inflammatory changes noted without  definitive pseudocyst. Chronic changes as described above. Electronically Signed   By: Alcide Clever M.D.   On: 03/04/2016 16:13    Procedures Procedures (including critical care time)  Medications Ordered in ED Medications  sodium chloride 0.9 % bolus 1,000 mL (not administered)  ondansetron (ZOFRAN) injection 4 mg (4 mg Intravenous Given 03/04/16 1429)  sodium chloride 0.9 % bolus 1,000 mL (1,000 mLs Intravenous New Bag/Given 03/04/16 1427)  morphine 4 MG/ML injection 4 mg (4 mg Intravenous Given 03/04/16 1431)  iopamidol (ISOVUE-300) 61 % injection (100 mLs  Contrast Given 03/04/16 1546)     Initial Impression / Assessment and Plan / ED Course  I have reviewed the triage vital signs and the nursing notes.  Pertinent labs & imaging results that were available during my care of the patient were reviewed by me and considered in my medical decision making (see chart for details).  Clinical Course     Afebrile nontoxic  patient with no hx pancreatitis p/w intermittent N/V and abdominal pain, worse over the past 4 days with inability to tolerate PO during these 4 days.  Pt thought symptoms were related to umbilical hernia - see above.  Seen by surgery today who sent pt to ED for further workup.  Lipase elevated, CT confirms acute on chronic pancreatitis.  Pt feeling improvement with IVF, pain and nausea medications.  Admitted to Internal Medicine Teaching Service.    Final Clinical Impressions(s) / ED Diagnoses   Final diagnoses:  Acute pancreatitis without infection or necrosis, unspecified pancreatitis type    New Prescriptions New Prescriptions   No medications on file     Trixie [place], PA-C 03/04/16 1725    Marily MemosJason Mesner, MD 03/07/16 [phone]

## 2016-03-04 NOTE — ED Triage Notes (Signed)
Pt reports having n/v/d x 4 days. Went to [facility] today for consult due to hernia and unable to schedule surgery due to possible dehydration.

## 2016-03-04 NOTE — Progress Notes (Signed)
Marco Pittman 03/04/2016 11:28 AM Location: [facility] Patient #: [id number] DOB: 03/04/1961 Single / Language: Len[place] Ponds[place] / Race: Black or African American Male   History of Present Illness Marco Pittman(Marco Pittman J. Gillie Crisci MD; 03/04/2016 11:56 AM) The patient is a 55 year old male.  Note:He is referred by Dr. Patria Maneampos because of a symptomatic umbilical hernia. He has had this for some time. He was seen in the emergency department in August and noted to have a hernia. He states sometimes a hernia comes out and he feels sick and throws up and then he gets better within 3 or 4 days. He's been having some nausea, vomiting, diarrhea for the past 3-4 days. He's been passing gas. He did have a normal bowel movement yesterday. He says his urine is becoming dark.  Other Problems Marco Pittman(Marco Pittman, Pittman; 03/04/2016 11:29 AM) Gastric Ulcer  Past Surgical History Marco Pittman(Marco Pittman, Pittman; 03/04/2016 11:29 AM) No pertinent past surgical history  Diagnostic Studies History Marco Pittman(Marco Pittman, Pittman; 03/04/2016 11:29 AM) Colonoscopy never  Allergies Marco Pittman(Marco Pittman, Pittman; 03/04/2016 11:30 AM) No Known Drug Allergies11/28/2017  Medication History Marco Pittman(Marco Pittman, Pittman; 03/04/2016 11:30 AM) No Current Medications Medications Reconciled  Social History Marco Pittman(Marco Pittman, Pittman; 03/04/2016 11:29 AM) Alcohol use Moderate alcohol use. Caffeine use Coffee, Tea. Illicit drug use Uses socially only. Tobacco use Current every day smoker.  Family History Marco Pittman(Marco Pittman, Pittman; 03/04/2016 11:29 AM) Colon Cancer Father. Diabetes Mellitus Father, Mother. Hypertension Father, Mother.    Review of Systems (Marco Sherrine MaplesGlenn Pittman; 03/04/2016 11:29 AM) General Present- Appetite Loss, Fatigue and Weight Loss. Not Present- Chills, Fever, Night Sweats and Weight Gain. Skin Not Present- Change in Wart/Mole, Dryness, Hives, Jaundice, New Lesions, Non-Healing Wounds, Rash and Ulcer. HEENT Present- Nose Bleed and Wears glasses/contact  lenses. Not Present- Earache, Hearing Loss, Hoarseness, Oral Ulcers, Ringing in the Ears, Seasonal Allergies, Sinus Pain, Sore Throat, Visual Disturbances and Yellow Eyes. Respiratory Not Present- Bloody sputum, Chronic Cough, Difficulty Breathing, Snoring and Wheezing. Breast Not Present- Breast Mass, Breast Pain, Nipple Discharge and Skin Changes. Cardiovascular Present- Shortness of Breath. Not Present- Chest Pain, Difficulty Breathing Lying Down, Leg Cramps, Palpitations, Rapid Heart Rate and Swelling of Extremities. Gastrointestinal Present- Abdominal Pain, Bloating, Gets full quickly at meals, Nausea and Vomiting. Not Present- Bloody Stool, Change in Bowel Habits, Chronic diarrhea, Constipation, Difficulty Swallowing, Excessive gas, Hemorrhoids, Indigestion and Rectal Pain. Male Genitourinary Not Present- Blood in Urine, Change in Urinary Stream, Frequency, Impotence, Nocturia, Painful Urination, Urgency and Urine Leakage. Musculoskeletal Present- Muscle Pain. Not Present- Back Pain, Joint Pain, Joint Stiffness, Muscle Weakness and Swelling of Extremities. Neurological Not Present- Decreased Memory, Fainting, Headaches, Numbness, Seizures, Tingling, Tremor, Trouble walking and Weakness. Psychiatric Present- Change in Sleep Pattern. Not Present- Anxiety, Bipolar, Depression, Fearful and Frequent crying. Endocrine Present- Hot flashes. Not Present- Cold Intolerance, Excessive Hunger, Hair Changes, Heat Intolerance and New Diabetes. Hematology Not Present- Blood Thinners, Easy Bruising, Excessive bleeding, Gland problems, HIV and Persistent Infections.  Vitals (Marco Pittman Pittman; 03/04/2016 11:30 AM) 03/04/2016 11:29 AM Weight: 156.38 lb Height: 69in Body Surface Area: 1.86 m Body Mass Index: 23.09 kg/m  Temp.: 98.37F  Pulse: 92 (Regular)  P.OX: 98% (Room air) BP: 140/96 (Sitting, Left Arm, Standard)       Physical Exam Marco Pittman(Marco Pittman J. Marco Eisenhour MD; 03/04/2016 11:58 AM) The physical  exam findings are as follows: Note:General: Thin, ill appearing male. Pleasant and cooperative.  CV: RRR, no murmur, no edema  RESP: Breath sounds equal and clear. Respirations nonlabored.  ABDOMEN:  Soft, nondistended, no masses, no organomegaly, active bowel sounds, no scars, easily reducible umbilical hernia with mild tenderness.  GU: No bulges.  MUSCULOSKELETAL: good muscle tone  NEUROLOGIC: Alert and oriented, answers questions appropriately.  PSYCHIATRIC: Normal mood, affect , and behavior.    Assessment & Plan Marco Pittman(Marco Pittman J. Sandrina Heaton MD; 03/04/2016 11:55 AM) UMBILICAL HERNIA WITHOUT OBSTRUCTION OR GANGRENE (K42.9) Impression: The hernia defect is small. The hernia simple and easily reduces. I do not feel that this is causing his nausea, vomiting, and diarrhea.  Plan: I'm concerned about dehydration and have told him he should go to the emergency department to be evaluated for his nausea, vomiting, and dehydration. Once the etiology of this has been discovered and treated, I told him I would be glad to fix his umbilical hernia area I have asked him to call back once the nausea and vomiting has resolved.  I have discussed the procedure, risks, and aftercare of umbilical hernia. Risks include but are not limited to bleeding, infection, wound healing problems, anesthesia, recurrence, injury to intra-abdominal organs, cosmetic deformity. He/she seems to understand and would like to proceed. Current Plans Follow up as needed Pt Education - [facility] Free Text Education/Instructions: discussed with patient and provided information. NAUSEA AND VOMITING IN ADULT (R11.2) Impression: Etiology is unclear. He has a simple, reducible umbilical hernia. I do not feel this is the cause of his symptoms.  Plan: I recommended he go to the emergency department for further evaluation.  Marco SavoyMichael W. Pittman 03/04/2016 11:28 AM Location: [facility] Patient #: [id number] DOB: 08/07/1960 Single /  Language: [place]ondsEnglish / Race: Black or African American Male   History of Present Illness Marco Pittman(Quintin Hjort J. Darothy Courtright MD; 03/04/2016 11:56 AM) The patient is a 55 year old male.  Note:He is referred by Dr. Patria Maneampos because of a symptomatic umbilical hernia. He has had this for some time. He was seen in the emergency department in August and noted to have a hernia. He states sometimes a hernia comes out and he feels sick and throws up and then he gets better within 3 or 4 days. He's been having some nausea, vomiting, diarrhea for the past 3-4 days. He's been passing gas. He did have a normal bowel movement yesterday. He says his urine is becoming dark.  Other Problems Marco Pittman(Marco Pittman, Pittman; 03/04/2016 11:29 AM) Gastric Ulcer  Past Surgical History Marco Pittman(Marco Pittman, Pittman; 03/04/2016 11:29 AM) No pertinent past surgical history  Diagnostic Studies History Marco Pittman(Marco Pittman, Pittman; 03/04/2016 11:29 AM) Colonoscopy never  Allergies Marco Pittman(Marco Pittman, Pittman; 03/04/2016 11:30 AM) No Known Drug Allergies11/28/2017  Medication History Marco Pittman(Marco Pittman, Pittman; 03/04/2016 11:30 AM) No Current Medications Medications Reconciled  Social History Marco Pittman(Marco Pittman, Pittman; 03/04/2016 11:29 AM) Alcohol use Moderate alcohol use. Caffeine use Coffee, Tea. Illicit drug use Uses socially only. Tobacco use Current every day smoker.  Family History Marco Pittman(Marco Pittman, Pittman; 03/04/2016 11:29 AM) Colon Cancer Father. Diabetes Mellitus Father, Mother. Hypertension Father, Mother.    Review of Systems (Marco Sherrine MaplesGlenn Pittman; 03/04/2016 11:29 AM) General Present- Appetite Loss, Fatigue and Weight Loss. Not Present- Chills, Fever, Night Sweats and Weight Gain. Skin Not Present- Change in Wart/Mole, Dryness, Hives, Jaundice, New Lesions, Non-Healing Wounds, Rash and Ulcer. HEENT Present- Nose Bleed and Wears glasses/contact lenses. Not Present- Earache, Hearing Loss, Hoarseness, Oral Ulcers, Ringing in the Ears, Seasonal Allergies, Sinus Pain,  Sore Throat, Visual Disturbances and Yellow Eyes. Respiratory Not Present- Bloody sputum, Chronic Cough, Difficulty Breathing, Snoring and Wheezing. Breast Not Present- Breast Mass, Breast Pain,  Nipple Discharge and Skin Changes. Cardiovascular Present- Shortness of Breath. Not Present- Chest Pain, Difficulty Breathing Lying Down, Leg Cramps, Palpitations, Rapid Heart Rate and Swelling of Extremities. Gastrointestinal Present- Abdominal Pain, Bloating, Gets full quickly at meals, Nausea and Vomiting. Not Present- Bloody Stool, Change in Bowel Habits, Chronic diarrhea, Constipation, Difficulty Swallowing, Excessive gas, Hemorrhoids, Indigestion and Rectal Pain. Male Genitourinary Not Present- Blood in Urine, Change in Urinary Stream, Frequency, Impotence, Nocturia, Painful Urination, Urgency and Urine Leakage. Musculoskeletal Present- Muscle Pain. Not Present- Back Pain, Joint Pain, Joint Stiffness, Muscle Weakness and Swelling of Extremities. Neurological Not Present- Decreased Memory, Fainting, Headaches, Numbness, Seizures, Tingling, Tremor, Trouble walking and Weakness. Psychiatric Present- Change in Sleep Pattern. Not Present- Anxiety, Bipolar, Depression, Fearful and Frequent crying. Endocrine Present- Hot flashes. Not Present- Cold Intolerance, Excessive Hunger, Hair Changes, Heat Intolerance and New Diabetes. Hematology Not Present- Blood Thinners, Easy Bruising, Excessive bleeding, Gland problems, HIV and Persistent Infections.  Vitals (Marco Pittman Pittman; 03/04/2016 11:30 AM) 03/04/2016 11:29 AM Weight: 156.38 lb Height: 69in Body Surface Area: 1.86 m Body Mass Index: 23.09 kg/m  Temp.: 98.60F  Pulse: 92 (Regular)  P.OX: 98% (Room air) BP: 140/96 (Sitting, Left Arm, Standard)       Physical Exam Marco Pollack MD; 03/04/2016 11:58 AM) The physical exam findings are as follows: Note:General: Thin, ill appearing male. Pleasant and cooperative.  CV: RRR, no murmur,  no edema  RESP: Breath sounds equal and clear. Respirations nonlabored.  ABDOMEN: Soft, nondistended, no masses, no organomegaly, active bowel sounds, no scars, easily reducible umbilical hernia with mild tenderness.  GU: No bulges.  MUSCULOSKELETAL: good muscle tone  NEUROLOGIC: Alert and oriented, answers questions appropriately.  PSYCHIATRIC: Normal mood, affect , and behavior.    Assessment & Plan Marco Pollack MD; 03/04/2016 11:55 AM) UMBILICAL HERNIA WITHOUT OBSTRUCTION OR GANGRENE (K42.9) Impression: The hernia defect is small. The hernia simple and easily reduces. I do not feel that this is causing his nausea, vomiting, and diarrhea.  Plan: I'm concerned about dehydration and have told him he should go to the emergency department to be evaluated for his nausea, vomiting, and dehydration. Once the etiology of this has been discovered and treated, I told him I would be glad to fix his umbilical hernia area I have asked him to call back once the nausea and vomiting has resolved.  I have discussed the procedure, risks, and aftercare of umbilical hernia. Risks include but are not limited to bleeding, infection, wound healing problems, anesthesia, recurrence, injury to intra-abdominal organs, cosmetic deformity. He/she seems to understand and would like to proceed. Current Plans Follow up as needed Pt Education - CCS Free Text Education/Instructions: discussed with patient and provided information. NAUSEA AND VOMITING IN ADULT (R11.2) Impression: Etiology is unclear. He has a simple, reducible umbilical hernia. I do not feel this is the cause of his symptoms.  Plan: I recommended he go to the emergency department for further evaluation.  Avel Peace, M.D.

## 2016-03-04 NOTE — H&P (Signed)
Date: 03/04/2016               Patient Name:  Marco SavoyMichael W Pittman MRN: [id number]  DOB: 05/01/1960 Age / Sex: 55 y.o., male   PCP: Quentin Angstlugbemiga E Jegede, MD         Medical Service: Internal Medicine Teaching Service         Attending Physician: Dr. Riley ChurchesJim Granfortuna    First Contact: Sharin MonsErnie F Soto, MS3 Pager: [phone]  Second Contact: Dr. Althia FortsAdam Tyrena Gohr Pager: [phone]  Third Contact:  Dr. Deneise LeverParth Saraiya Pager [phone]  After Hours (After 5p/  First Contact Pager: [phone]  weekends / holidays): Second Contact Pager: [phone]   Chief Complaint: Nausea, vomiting, abdominal pain  History of Present Illness: Marco SavoyMichael W Pittman is a 55 y.o. gentleman with PMH HTN, symptomatic umbilical hernia [facility] [facility] consult but was found be dehydrated with N/V and sent to the ED. He reports 4-5 days of persistent nausea, vomiting, and periumbilical abdominal pain. His pain is a ache that improved with steady pressure on his stomach and laying down, non-radiating. He has been unable to keep food or liquids down, and reports losing several pounds over the past week, and feels very tired. He reports that he vomit looks white, and denies any blood or bile. He reports that his urine has become dark / orange-colored, and he continues to pass gas and have regular BMs. He reports subjective fevers/chills, fatigue that impairs his ability to work, but denies diarrhea/constipation, chest pain, shortness of breath, palpitations, or urinary symptoms. He denies any abdominal pain after eating, has had no abdominal surgeries, and drinks 48-72 oz beer daily - last drink 3 days ago. He reports similar episodes of intermittent abdominal discomfort over the past few months that have self-resolved.  In the ED - afebrile 98.9 F, HR 80, RR 18, BP 139/90, SpO2 95% on RA. Received morphine 4mg  IV zofran 4mg  IV, 1L NS bolus x2. Labs were remarkable for elevated Lipase 472, CMP nml, Hb  18.3, U/A with hyaline casts, rare bacteria. CT abdomen / pelvis revealed changes consistent with acute on chronic pancreatitis with evidence of pancreatic ductal dilatation and mass-effect upon the distal common bile duct and biliary ductal dilatation. Considerable peripancreatic inflammatory changes noted without definitive pseudocyst.    Meds:  Current Meds  Medication Sig  . acetaminophen (TYLENOL) 325 MG tablet Take 650 mg by mouth every 6 (six) hours as needed for mild pain.    Allergies: Allergies as of 03/04/2016  . (No Known Allergies)   Past Medical History:  Diagnosis Date  . Aneurysm (HCC)   . Hypertension     Family History:  Family History  Problem Relation Age of Onset  . Cancer Father [id number]    colon    Social History:  Social History   Social History  . Marital status: Single    Spouse name: N/A  . Number of children: N/A  . Years of education: N/A   Occupational History  . Not on file.   Social History Main Topics  . Smoking status: Current Every Day Smoker    Years: 40.00    Types: Cigars  . Smokeless tobacco: Never Used  . Alcohol use 0.0 oz/week    2 - 3 Cans of beer per week  . Drug use: No  . Sexual activity: Yes   Other Topics Concern  . Not on file   Social History Narrative  .  No narrative on file    Review of Systems: A complete ROS was negative except as per HPI.   Physical Exam: Blood pressure (!) 145/104, pulse 84, temperature 98.4 F (36.9 C), temperature source Oral, resp. rate 16, height 5' 9.5" (1.765 m), weight 70.4 kg (155 lb 5 oz), SpO2 100 %.  General appearance: Well nourished AA gentleman resting comfortably in bed, in no distress, conversational HENT: Normocephalic, dry mucous membranes, poor dentition, neck supple  Eyes: PERRL, non-icteric Cardiovascular: Regular rate and rhythm, no murmurs, rubs, gallops Respiratory: Clear to auscultation bilaterally, normal work of breathing Abdomen: Small umbilical hernia  present, BS+, soft, non-distended, tympanic, diffuse mild tenderness to palpation, worst over umbilicus Extremities: Normal bulk and range of motion, no edema, 2+ peripheral pulses Skin: Warm, dry, intact Neuro: Alert and oriented, cranial nerves grossly intact Psych: Appropriate affect, clear speech, thoughts linear and goal-directed  Assessment & Plan by Problem:  Marco Pittman is a 55 y.o. gentleman with PMH HTN and symptomatic umbilical hernia admitted for pancreatitis.  Active Problems:   Pancreatitis  Pancreatitis, presenting with N/V abdominal pain, anorexia, lipase elevated to 472, CT A/P consistent with acute on chronic pancreatitis with evidence of pancreatic ductal dilatation and mass-effect upon the distal common bile duct and biliary ductal dilatation. Considerable peripancreatic inflammatory changes noted without definitive pseudocyst. No visible gallstones. - Follow abdominal US results, ordered by EDP - NPO bowel rest, ice chips, consider advancing diet tomorrow - mIVF 125 cc/hr overnight - Dilaudid 4mg  Q4H PRN for moderate/severe pain - Zofran 4mg  Q8H PRN for nausea - Check HbA1c, TSH, lipid panel - Trend CBC, CMP  HTN, pressures 124-145 / 90-104, not on medications currently, was previously taking Lisinopril 10mg  - Assess BP tomorrow and consider starting HCTZ or Amlodipine  FEN/GI: NPO/ice chips, replete electrolytes as needed  DVT ppx: Lovenox  Code status: Full code  Dispo: Admit patient to Observation with expected length of stay less than 2 midnights.  Signed: Althia FortsAdam Nivek Powley, MD 03/04/2016, 4:46 PM  Pager: [phone]

## 2016-03-05 DIAGNOSIS — K859 Acute pancreatitis without necrosis or infection, unspecified: Principal | ICD-10-CM

## 2016-03-05 DIAGNOSIS — K8681 Exocrine pancreatic insufficiency: Secondary | ICD-10-CM

## 2016-03-05 DIAGNOSIS — K861 Other chronic pancreatitis: Secondary | ICD-10-CM

## 2016-03-05 DIAGNOSIS — E43 Unspecified severe protein-calorie malnutrition: Secondary | ICD-10-CM | POA: Insufficient documentation

## 2016-03-05 LAB — HIV ANTIBODY (ROUTINE TESTING W REFLEX): HIV SCREEN 4TH GENERATION: NONREACTIVE

## 2016-03-05 LAB — LIPID PANEL
CHOL/HDL RATIO: 4.2 ratio
Cholesterol: 110 mg/dL (ref 0–200)
HDL: 26 mg/dL — ABNORMAL LOW (ref >40)
LDL Cholesterol: 72 mg/dL (ref 0–99)
Triglycerides: 58 mg/dL (ref <150)
VLDL: 12 mg/dL (ref 0–40)

## 2016-03-05 LAB — COMPREHENSIVE METABOLIC PANEL
ALBUMIN: 3.3 g/dL — AB (ref 3.5–5.0)
ALK PHOS: 102 U/L (ref 38–126)
ALT: 39 U/L (ref 17–63)
AST: 61 U/L — ABNORMAL HIGH (ref 15–41)
Anion gap: 11 (ref 5–15)
BUN: 7 mg/dL (ref 6–20)
CALCIUM: 8.9 mg/dL (ref 8.9–10.3)
CO2: 24 mmol/L (ref 22–32)
CREATININE: 0.95 mg/dL (ref 0.61–1.24)
Chloride: 102 mmol/L (ref 101–111)
GFR calc Af Amer: >60 mL/min (ref >60)
GFR calc non Af Amer: >60 mL/min (ref >60)
GLUCOSE: 92 mg/dL (ref 65–99)
Potassium: 3.5 mmol/L (ref 3.5–5.1)
SODIUM: 137 mmol/L (ref 135–145)
Total Bilirubin: 0.9 mg/dL (ref 0.3–1.2)
Total Protein: 6.6 g/dL (ref 6.5–8.1)

## 2016-03-05 LAB — CBC
HCT: 43 % (ref 39.0–52.0)
Hemoglobin: 15.3 g/dL (ref 13.0–17.0)
MCH: 31.1 pg (ref 26.0–34.0)
MCHC: 35.6 g/dL (ref 30.0–36.0)
MCV: 87.4 fL (ref 78.0–100.0)
PLATELETS: 149 10*3/uL — AB (ref 150–400)
RBC: 4.92 MIL/uL (ref 4.22–5.81)
RDW: 12.9 % (ref 11.5–15.5)
WBC: 4.3 10*3/uL (ref 4.0–10.5)

## 2016-03-05 LAB — GLUCOSE, CAPILLARY: GLUCOSE-CAPILLARY: 113 mg/dL — AB (ref 65–99)

## 2016-03-05 MED ORDER — BOOST / RESOURCE BREEZE PO LIQD: 1.0000 | Freq: Three times a day (TID) | ORAL | 3 refills | Status: DC

## 2016-03-05 MED ORDER — ORAL CARE MOUTH RINSE: 15.0000 mL | Freq: Two times a day (BID) | OROMUCOSAL | Status: DC

## 2016-03-05 MED ORDER — HYDROCHLOROTHIAZIDE 12.5 MG PO CAPS: 12.5000 mg | ORAL_CAPSULE | Freq: Every day | ORAL | 2 refills | Status: DC

## 2016-03-05 MED ORDER — BOOST / RESOURCE BREEZE PO LIQD
1.0000 | Freq: Three times a day (TID) | ORAL | Status: DC
  Administered 2016-03-05: 1 via ORAL

## 2016-03-05 MED ORDER — HYDROCHLOROTHIAZIDE 12.5 MG PO CAPS
12.5000 mg | ORAL_CAPSULE | Freq: Every day | ORAL | Status: DC
  Administered 2016-03-05: 12.5 mg via ORAL
  Filled 2016-03-05: qty 1

## 2016-03-05 NOTE — Progress Notes (Signed)
Initial Nutrition Assessment  DOCUMENTATION CODES:   Severe malnutrition in context of acute illness/injury  INTERVENTION:  Provide Boost Breeze po TID, each supplement provides 250 kcal and 9 grams of protein.  Encourage adequate PO intake.   NUTRITION DIAGNOSIS:   Malnutrition related to acute illness as evidenced by energy intake < or equal to 50% for > or equal to 5 days, percent weight loss, moderate depletions of muscle mass, moderate depletion of body fat.  GOAL:   Patient will meet greater than or equal to 90% of their needs  MONITOR:   PO intake, Supplement acceptance, Labs, Weight trends, Skin, I & O's  REASON FOR ASSESSMENT:   Malnutrition Screening Tool    ASSESSMENT:   55 y.o. gentleman with PMH HTN and symptomatic umbilical hernia admitted for pancreatitis. presenting with N/V abdominal pain, anorexia, lipase elevated to 472, CT A/P consistent with acute on chronic pancreatitis with evidence of pancreatic ductal dilatation and mass-effect upon the distal common bile duct and biliary ductal dilatation. Considerable peripancreatic inflammatory changes noted without definitive pseudocyst. No visible gallstones.  Diet has been advanced to a regular diet. Meal completion 10% on a full liquid diet this AM. Pt reports abdominal pains have improved. Pt reports poor po intake the 5 days PTA. Pt reports he would try to eat however unable to keep foods down. Prior to acute illness, pt reports usually consuming 3 meals a day. Pt reports weight loss. Usual body weight reported to be ~175 lbs which pt reports last weighed one week ago. Pt with a 9% weight loss in 1 week. Pt is agreeable to nutritional supplements to aid in caloric and protein needs. RD [facility] as it is fat free which will aid in tolerance as pt currently with pancreatitis.   Nutrition-Focused physical exam completed. Findings are moderate fat depletion, moderate muscle depletion, and no edema.   Labs  and medications reviewed.   Diet Order:  Diet regular Room service appropriate? Yes; Fluid consistency: Thin  Skin:  Reviewed, no issues  Last BM:  11/25  Height:   Ht Readings from Last 1 Encounters:  03/04/16 5' 9.5" (1.765 m)    Weight:   Wt Readings from Last 1 Encounters:  03/04/16 159 lb 12.8 oz (72.5 kg)    Ideal Body Weight:  74 kg  BMI:  Body mass index is 23.26 kg/m.  Estimated Nutritional Needs:   Kcal:  2100-2300  Protein:  105-115 grams  Fluid:  2.1- 2.3 L/day  EDUCATION NEEDS:   No education needs identified at this time  Roslyn SmilingStephanie Maryum Batterson, MS, RD, LDN Pager # [phone] After hours/ weekend pager # [phone]

## 2016-03-05 NOTE — Discharge Summary (Signed)
Name: Marco Pittman MRN: [id number] DOB: 10/08/1960 55 y.o. PCP: No Pcp Per Patient  Date of Admission: 03/04/2016  1:38 PM Date of Discharge: 03/05/2016 Attending Physician: Levert FeinsteinJamPollyann Savoyes M Granfortuna, MD  Discharge Diagnosis: 1. Acute on chronic pancreatitis  Active Problems:   Pancreatitis   Protein-calorie malnutrition, severe   Discharge Medications:   Medication List    STOP taking these medications   acetaminophen 325 MG tablet Commonly known as:  TYLENOL     TAKE these medications   feeding supplement Liqd Take 1 Container by mouth 3 (three) times daily between meals.   hydrochlorothiazide 12.5 MG capsule Commonly known as:  MICROZIDE Take 1 capsule (12.5 mg total) by mouth daily.       Disposition and follow-up:   Marco Pittman was discharged from [facility] in Good condition.  At the hospital follow up visit please address:  1.  Acute on chronic pancreatitis - mild, likely secondary to chronic alcohol abuse - assess abdominal pain, nausea, vomiting, alcohol intake   - Essential HTN - no medications currently, previously was taking Lisinopril, prescribed HCTZ 12.5 mg prior to discharge - assess BP control   - Tobacco use - reports smoking 3 cigars weekly, encourage cessation  - Health maintenance - family history of colon cancer in his father, diagnosed around this age - assess weight loss, should undergo screening colonoscopy  - Malnutrition - assess weight, nutritional supplement intake  2.  Labs / imaging needed at time of follow-up: BMP  3.  Pending labs/ test needing follow-up: None  Follow-up Appointments: Follow-up Information    [facility]. Go on 03/19/2016.   Why:  Appointment at 945am, please arrive 15 mins early to check in Contact information: [address] [place] [id number] [phone]          Hospital Course by problem list: Active Problems:    Pancreatitis   Protein-calorie malnutrition, severe   1. Acute on chronic pancreatitis - Marco Pittman, Marco Pittman is 55 yo man with PMH ETOH abuse and HTN presented to the ED on 11/28 following several days of abdominal pain and vomiting and was found to have elevated lipase and CT abdomen showing acute on chronic pancreatitis. He received IV fluids, bowel rest, and PRNs for pain and nausea overnight with complete resolution of symptoms by the following day. His diet was advanced and well-tolerated on 11/29. Both CT abdomen and ultrasound revealed no signs of gallstones or mass, and his lab findings were unremarkable - suggesting his chronic alcohol use as the most likely etiology for his pancreatitis. He was deemed stable for discharge with plans to [facility] two weeks.    BP (!) 148/98 (BP Location: Right Arm)   Pulse 93   Temp 100 F (37.8 C) (Oral)   Resp 17   Ht 5' 9.5" (1.765 m)   Wt 159 lb 12.8 oz (72.5 kg)   SpO2 100%   BMI 23.26 kg/m   Pertinent Labs, Studies, and Procedures:  CMP Latest Ref Rng & Units 03/05/2016 03/04/2016 12/21/2012  Glucose 65 - 99 mg/dL 92 956(O127(H) 84  BUN 6 - 20 mg/dL 7 9 7   Creatinine 0.61 - 1.24 mg/dL 1.300.95 8.650.93 7.840.85  Sodium 135 - 145 mmol/L 137 137 140  Potassium 3.5 - 5.1 mmol/L 3.5 4.3 3.9  Chloride 101 - 111 mmol/L 102 104 108  CO2 22 - 32 mmol/L 24 23 27   Calcium 8.9 -  10.3 mg/dL 8.9 9.8 9.2  Total Protein 6.5 - 8.1 g/dL 6.6 7.8 6.5  Total Bilirubin 0.3 - 1.2 mg/dL 0.9 0.6 0.4  Alkaline Phos 38 - 126 U/L 102 90 51  AST 15 - 41 U/L 61(H) 19 15  ALT 17 - 63 U/L 39 15(L) 9   Results for Marco Pittman, Marco Pittman (MRN [id number]) as of 03/08/2016 13:06  Ref. Range 03/05/2016 05:43  Hemoglobin A1C Latest Ref Range: 4.8 - 5.6 % 5.9 (H)   Lipid Panel     Component Value Date/Time   CHOL 110 03/05/2016 0543   TRIG 58 03/05/2016 0543   HDL 26 (L) 03/05/2016 0543   CHOLHDL 4.2 03/05/2016 0543   VLDL 12 03/05/2016 0543   LDLCALC 72 03/05/2016  0543   US Abdomen Complete  Result Date: 03/04/2016 CLINICAL DATA:  Pain, pancreatitis EXAM: ABDOMEN ULTRASOUND COMPLETE COMPARISON:  CT 03/04/2016 FINDINGS: Gallbladder: No gallstones or wall thickening visualized. No sonographic Murphy sign noted by sonographer. Common bile duct: Diameter: 7 mm Liver: No focal hepatic abnormality. Echogenicity within normal limits. There is intra hepatic biliary dilatation. IVC: No abnormality visualized. Pancreas: Pancreatic calcifications are present. The pancreatic duct is dilated, measuring up to 6 mm in size. No gross peripancreatic fluid collections. Spleen: Size and appearance within normal limits. Right Kidney: Length: 11.1 cm. Echogenicity within normal limits. No mass or hydronephrosis visualized. Left Kidney: Length: 10.5 cm. Echogenicity within normal limits. No mass or hydronephrosis visualized. Abdominal aorta: Ectatic but no aneurysm. Distal aorta and bifurcation are not seen. Maximum AP diameter of 2.8 cm. Other findings: None. IMPRESSION: 1. No sonographic evidence for gallstones. 2. Intra hepatic and extrahepatic biliary dilatation. 3. Dilated pancreatic duct. Pancreatic calcifications corresponding to CT findings, consistent with chronic pancreatitis. No gross focal pancreatic fluid collections are visualized. Electronically Signed   By: Jasmine Pang M.D.   On: 03/04/2016 22:27   Ct Abdomen Pelvis W Contrast  Result Date: 03/04/2016 CLINICAL DATA:  Periumbilical pain for 1 month and elevated lipase EXAM: CT ABDOMEN AND PELVIS WITH CONTRAST TECHNIQUE: Multidetector CT imaging of the abdomen and pelvis was performed using the standard protocol following bolus administration of intravenous contrast. CONTRAST:  ISOVUE-300 IOPAMIDOL (ISOVUE-300) INJECTION 61% COMPARISON:  12/22/2012 FINDINGS: Lower chest: No acute abnormality. Hepatobiliary: No focal mass is noted within the liver. The gallbladder is well distended. There is evidence of intrahepatic  and extrahepatic biliary ductal dilatation extending to the level of the head of the pancreas. Pancreas: Scattered calcifications are noted within the head and uncinate process of the pancreas as well as throughout the body and tail consistent with chronic pancreatitis. Diffuse pancreatic ductal dilatation is seen new from the prior exam. There is also significant peripancreatic inflammatory change without focal pseudocyst or significant phlegmon. Delayed images demonstrate no significant abnormal enhancement to suggest pancreatic necrosis at this time. Spleen: Normal in size without focal abnormality. Adrenals/Urinary Tract: Adrenal glands are unremarkable. Kidneys are normal, without renal calculi, focal lesion, or hydronephrosis. Bladder is unremarkable. Stomach/Bowel: Diverticulosis without evidence of diverticulitis is noted. The appendix is within normal limits. No obstructive changes are seen. Vascular/Lymphatic: Aortic atherosclerosis. No enlarged abdominal or pelvic lymph nodes. Reproductive: Prostate is unremarkable. Other: Minimal free pelvic fluid is noted Musculoskeletal: Degenerative changes of the lumbar spine are noted. No acute bony abnormality is seen. IMPRESSION: Changes consistent with acute on chronic pancreatitis with evidence of pancreatic ductal dilatation likely mass-effect upon the distal common bile duct with biliary ductal dilatation. Considerable peripancreatic inflammatory changes  noted without definitive pseudocyst. Chronic changes as described above. Electronically Signed   By: Alcide CleverMark  Lukens M.D.   On: 03/04/2016 16:13   Discharge Instructions: Discharge Instructions    Call MD for:  difficulty breathing, headache or visual disturbances    Complete by:  As directed    Call MD for:  persistant dizziness or light-headedness    Complete by:  As directed    Call MD for:  persistant nausea and vomiting    Complete by:  As directed    Call MD for:  severe uncontrolled pain     Complete by:  As directed    Call MD for:  temperature >100.4    Complete by:  As directed    Diet - low sodium heart healthy    Complete by:  As directed    Discharge instructions    Complete by:  As directed    We have prescribed a low dose blood pressure medication - Microzide - to take daily - to treat your high blood pressure. We have also prescribed a Boost nutritional supplement as well. We believe that you developed pancreatitis from your excess drinking - you should avoid drinking alcohol and smoking in the future.   You still have some degree of inflammation in your pancreas, avoid eating excessively large or fatty meals for the next two days.  We have scheduled a hospital follow up visit for you in about two weeks - please keep this appointment so that we can check on your symptoms and find you a primary care doctor.   Increase activity slowly    Complete by:  As directed       Signed: Althia FortsAdam Renaldo Gornick, MD 03/08/2016, 12:54 PM   Pager: [phone]

## 2016-03-05 NOTE — Progress Notes (Addendum)
Subjective: [name] is in good spirits today with friends and family at bedside. He reports his nausea and pain have greatly improved and he feels much better this morning after receiving IV fluids overnight. He reports no issues with eating ice chips all night and he has a strong appetite this morning and is interested in trying some food. Discussed the diagnosis of pancreatitis with patient and family and the need to make lifestyle changes to avoid this in future (quit excess EtOH intake).  Objective: Vital signs in last 24 hours: Vitals:   03/04/16 1735 03/04/16 1817 03/04/16 2232 03/05/16 0518  BP: 145/98 (!) 146/95 113/72 126/78  Pulse: 76 69 75 77  Resp: 18 18 18 16   Temp: 98 F (36.7 C) 98.8 F (37.1 C) 99.3 F (37.4 C) 98.5 F (36.9 C)  TempSrc:  Oral Oral Oral  SpO2: 99% 100% 94% 98%  Weight:   72.5 kg (159 lb 12.8 oz)   Height:        Intake/Output Summary (Last 24 hours) at 03/05/16 0846 Last data filed at 03/05/16 [id number]  Gross per 24 hour  Intake          2164.58 ml  Output                0 ml  Net          2164.58 ml    Physical Exam General appearance: Well nourished AA gentleman resting comfortably in bed, in no distress, conversational Cardiovascular: Regular rate and rhythm, no murmurs, rubs, gallops Respiratory: Clear to auscultation bilaterally, normal work of breathing Abdomen: Small umbilical hernia present, BS+, soft, non-distended, tympanic, mild tenderness to palpation Extremities: Normal bulk and range of motion, no edema, 2+ peripheral pulses Skin: Warm, dry, intact Psych: Appropriate affect, clear speech, thoughts linear and goal-directed  Labs / Imaging / Procedures: CBC Latest Ref Rng & Units 03/05/2016 03/04/2016 12/21/2012  WBC 4.0 - 10.5 K/uL 4.3 5.5 4.0  Hemoglobin 13.0 - 17.0 g/dL 96.015.3 18.3(H) 15.1  Hematocrit 39.0 - 52.0 % 43.0 50.2 42.6  Platelets 150 - 400 K/uL 149(L) 160 255   CMP Latest Ref Rng & Units 03/05/2016 03/04/2016  12/21/2012  Glucose 65 - 99 mg/dL 92 454(U127(H) 84  BUN 6 - 20 mg/dL 7 9 7   Creatinine 0.61 - 1.24 mg/dL 9.810.95 1.910.93 4.780.85  Sodium 135 - 145 mmol/L 137 137 140  Potassium 3.5 - 5.1 mmol/L 3.5 4.3 3.9  Chloride 101 - 111 mmol/L 102 104 108  CO2 22 - 32 mmol/L 24 23 27   Calcium 8.9 - 10.3 mg/dL 8.9 9.8 9.2  Total Protein 6.5 - 8.1 g/dL 6.6 7.8 6.5  Total Bilirubin 0.3 - 1.2 mg/dL 0.9 0.6 0.4  Alkaline Phos 38 - 126 U/L 102 90 51  AST 15 - 41 U/L 61(H) 19 15  ALT 17 - 63 U/L 39 15(L) 9    [place] Abdomen Complete  Result Date: 03/04/2016 CLINICAL DATA:  Pain, pancreatitis EXAM: ABDOMEN ULTRASOUND COMPLETE COMPARISON:  CT 03/04/2016 FINDINGS: Gallbladder: No gallstones or wall thickening visualized. No sonographic Murphy sign noted by sonographer. Common bile duct: Diameter: 7 mm Liver: No focal hepatic abnormality. Echogenicity within normal limits. There is intra hepatic biliary dilatation. IVC: No abnormality visualized. Pancreas: Pancreatic calcifications are present. The pancreatic duct is dilated, measuring up to 6 mm in size. No gross peripancreatic fluid collections. Spleen: Size and appearance within normal limits. Right Kidney: Length: 11.1 cm. Echogenicity within normal limits. No mass or  hydronephrosis visualized. Left Kidney: Length: 10.5 cm. Echogenicity within normal limits. No mass or hydronephrosis visualized. Abdominal aorta: Ectatic but no aneurysm. Distal aorta and bifurcation are not seen. Maximum AP diameter of 2.8 cm. Other findings: None. IMPRESSION: 1. No sonographic evidence for gallstones. 2. Intra hepatic and extrahepatic biliary dilatation. 3. Dilated pancreatic duct. Pancreatic calcifications corresponding to CT findings, consistent with chronic pancreatitis. No gross focal pancreatic fluid collections are visualized. Electronically Signed   By: Jasmine PangKim  Fujinaga M.D.   On: 03/04/2016 22:27    Assessment/Plan: Marco Pittman is a 55 y.o. gentleman with PMH HTN and symptomatic  umbilical hernia admitted for pancreatitis.  Active Problems:   Pancreatitis  Pancreatitis, acute on chronic in setting of longstanding alcohol use (48-72oz beer daily), presenting with N/V abdominal pain, anorexia, lipase elevated to 472, CT A/P consistent with acute on chronic pancreatitis with evidence of pancreatic ductal dilatation and mass-effect upon the distal common bile duct and biliary ductal dilatation. Considerable peripancreatic inflammatory changes noted without definitive pseudocyst. No visible gallstones. - Follow abdominal US - no evidence of gallstones but with intra-hepatic and extrahepatic biliary dilatation and dilated pancreatic duct - Liquid diet this morning, if tolerated solid diet this afternoon -  Tylenol prn for pain - Zofran 4mg  Q8H PRN for nausea - Check HIV, TSH, lipid panel - normal - HbA1c pending - Trend CBC, CMP QD  HTN, pressures 124-145 / 90-104, not on medications currently, was previously taking Lisinopril 10mg  - Start HCTZ 12.5mg  daily  FEN/GI: Liquid diet, replete electrolytes as needed  DVT ppx: Heparin   Dispo: Anticipated discharge in 0-1 days.  LOS: 1 day   Althia FortsAdam Marico Buckle, MD 03/05/2016, 8:46 AM Pager: [phone]

## 2016-03-05 NOTE — Progress Notes (Signed)
Subjective: Mr. [employer] was met with the team. We discussed the diagnosis of pancreatitis and possible etiologies, most likely being ETOH. We discussed the plan going forward which they agreed to.  Objective: Vital signs in last 24 hours: Vitals:   03/04/16 1817 03/04/16 2232 03/05/16 0518 03/05/16 0954  BP: (!) 146/95 113/72 126/78 (!) 140/93  Pulse: 69 75 77 66  Resp: 18 18 16 16   Temp: 98.8 F (37.1 C) 99.3 F (37.4 C) 98.5 F (36.9 C) 99 F (37.2 C)  TempSrc: Oral Oral Oral Oral  SpO2: 100% 94% 98% 99%  Weight:  72.5 kg (159 lb 12.8 oz)    Height:       Weight change:   Intake/Output Summary (Last 24 hours) at 03/05/16 1425 Last data filed at 03/05/16 0930  Gross per 24 hour  Intake          2384.58 ml  Output                0 ml  Net          2384.58 ml   Blood pressure 145/98, pulse 76, temperature 98 F (36.7 C), resp. rate 18, height 5' 9.5" (1.765 m), weight 70.4 kg (155 lb 5 oz), SpO2 99 %. Gen: Patient is well appearing, lying in bed, fully conversant with team. HEENT: normocephalic and atraumatic. Pupils are equally round and reactive to light. Moist mucus membranes. Anicteric. No oral lesions present. Tongue is normal size with no lesions. Tyroid is unilateral to the right side.  Lymphatics: no lymphadenopathy present posterior cervical, posterior auricular, occipital, periauricular, parotid submental or submandibular. No axillary, supraclavicular or inguinal lymphadenopathy present.  Cv: No sternal scars present. Regular Rate and rhythm. NL s1 s2 with no heaves rubs or murmurs.  Vascular: Radial, dorsalis pedis and posterior tibial pulses 2+.  Resp: No scars present on back. Normal work of breathing. Clear to auscultation bilaterally. Tympanic percussion throughout.  Abdomen: No abdominal scars present. Umbilical hernia protruding 1 cm. Non distended. Nontender to palpitation in all four quadrants. No pain with rebound. No cullen's sign. Symmetric  percussion throughout. No organomegaly or masses.  Neuro: Oriented x3. Normal visual acuity. EOM intact. Facial motor symmetry. Facial sensation is symmetric. Uvula and tongue are midline. Grossly normal strength throughout.  Psych: normal mood and affect.  Ext: Warm extremities. No LE edema. No cyanosis or clubbing. No rashes present. MSK: No obvious deformed joints. Lab Results: CMP was notable for decreased albumin and elevated AST. CBC was wnl. HIV returned negative. Lipid was wnl.   Studies/Results: CTA - Changes consistent with acute on chronic pancreatitis with evidence of pancreatic ductal dilatation likely mass-effect upon the distal common bile duct with biliary ductal dilatation. Considerable peripancreatic inflammatory changes noted without definitive Pseudocyst. See imaging note for formal report.   US abdomen showed no sonographic evidence for gallstones. There was intra hepatic and extrahepatic biliary dilatation, a dilated pancreatic duct and pancreatic calcifications corresponding to CT findings, consistent with chronic pancreatitis. No gross focal pancreatic fluid collections are visualized.  Medications: I have reviewed the patient's current medications. Scheduled Meds: . docusate sodium  100 mg Oral BID  . heparin  5,000 Units Subcutaneous Q8H  . hydrochlorothiazide  12.5 mg Oral Daily  . mouth rinse  15 mL Mouth Rinse BID  . sodium chloride flush  3 mL Intravenous Q12H   Continuous Infusions: PRN Meds:.acetaminophen **OR** acetaminophen, ondansetron **OR** ondansetron (ZOFRAN) IV Assessment/Plan: Active Problems:   Pancreatitis  Marco Pittman,  Marco Pittman is 55 yo M with a PMH ETOH use and HTN presented to the ED with abdominal pain and vomiting most likely due to interstitial edematous pancreatitis superimposed on chronic pancreatits.  Pancreatitis The patient's vomiting, abdominal pain elevated lipase and CTA showing peripancreatic inflammation along with scattered  calcification is most consistent with interstitial edematous pancreatitis. Although the patient exhibits rebound tenderness, the patient remains afebrile and no leukocytosis. This presentation of pancreatitis is mild given the BUN of 9 and ranson criteria of 1 or 0-3% mortality, mild pain, no SOB or brusing of the abdomen or crackles of the lungs on exam. Given the mild presentation, the patient should be advanced diet as tolerated with appropriate pain control. Urine sed showing hylain casts and hemoconcentration suggest dehydration which will be addressed with hydration.    - No pain clear liquid diet - No pain on palpitation   - hydromorphine 1 mg q4 PRN  - mIVF LR 128m  - clear liquids this Am, will reassess PM  - CBC qd  - CMP qd  - HIV negative  - Lipid panel - No hypertriglyceridemia    HTN Not currently taking medication. Will monitor   - Vitals qshift  FENGI  - Liquids in AM  - Ondansetron [id number] PRN  - colace 100 mg BID PRN DVT prophylaxis: Heparin SQ  Dispo  - Tonight if regular diet asymptomatic   - Advised f/u with PCP for colonoscopy  This is a [employer] information officerote.  The care of the patient was discussed with Dr. JoWynetta Emerynd the assessment and plan formulated with their assistance.  Please see their attached note for official documentation of the daily encounter.   LOS: 1 day   ErBenn MoulderMedical Student 03/05/2016, 2:25 PM

## 2016-03-05 NOTE — Progress Notes (Signed)
Patient discharged to home. IV removed. Telemetry removed. Medications and followup appts reviewed. Patient left unit with all belongings in tow in wheelchair.   Avelina LaineKimberly Joon Pohle RN

## 2016-03-06 LAB — HEMOGLOBIN A1C
Hgb A1c MFr Bld: 5.9 % — ABNORMAL HIGH (ref 4.8–5.6)
Mean Plasma Glucose: 123 mg/dL

## 2016-03-19 ENCOUNTER — Ambulatory Visit (INDEPENDENT_AMBULATORY_CARE_PROVIDER_SITE_OTHER): Payer: Medicaid Other | Admitting: Internal Medicine

## 2016-03-19 VITALS — BP 126/83 | HR 82 | Temp 98.1°F | Ht 69.0 in | Wt 169.2 lb

## 2016-03-19 DIAGNOSIS — K860 Alcohol-induced chronic pancreatitis: Secondary | ICD-10-CM

## 2016-03-19 DIAGNOSIS — F1011 Alcohol abuse, in remission: Secondary | ICD-10-CM

## 2016-03-19 DIAGNOSIS — F17210 Nicotine dependence, cigarettes, uncomplicated: Secondary | ICD-10-CM | POA: Diagnosis not present

## 2016-03-19 DIAGNOSIS — Z79899 Other long term (current) drug therapy: Secondary | ICD-10-CM | POA: Diagnosis not present

## 2016-03-19 DIAGNOSIS — Z720 Tobacco use: Secondary | ICD-10-CM

## 2016-03-19 DIAGNOSIS — I10 Essential (primary) hypertension: Secondary | ICD-10-CM

## 2016-03-19 DIAGNOSIS — F1021 Alcohol dependence, in remission: Secondary | ICD-10-CM | POA: Diagnosis not present

## 2016-03-19 MED ORDER — HYDROCHLOROTHIAZIDE 12.5 MG PO CAPS: 12.5000 mg | ORAL_CAPSULE | Freq: Every day | ORAL | 2 refills | Status: DC

## 2016-03-19 NOTE — Progress Notes (Signed)
Medicine attending: I personally interviewed and briefly examined this patient on the day of the patient visit and reviewed pertinent clinical ,laboratory, and radiographic data  with resident physician Dr. Noemi ChapelBethany Molt and we discussed a management plan. I know this man from recent hospitalization for mild episode of alcohol related pancreatitis. He is making a significant effort to stop drinking and has not had any alcohol since discharge. Abdomen currently soft, non-tender.

## 2016-03-19 NOTE — Assessment & Plan Note (Signed)
Pt endorses long hx of alcohol abuse, admits to drinking >100 oz beer daily since he was young. Recent hospitalization and physician advice to quit drinking has inspired him to stop drinking since hospital d/c.  -Encouraged continued abstinence from alcohol -He denies ever having any withdrawal symptoms

## 2016-03-19 NOTE — Patient Instructions (Addendum)
It was a pleasure seeing you today! I look forward to working with you.  1. I'm glad you are feeling better since your hospital visit. We are happy to take care of you here in the clinic. Please continue to abstain from alcohol. If possible, please try to quit smoking as well. 2. Please continue to take your blood pressure medication. I have sent in refills to last you until your next visit. 3. Happy holidays! Make an appointment with us when you need us, otherwise we will see you in 3-6 months!

## 2016-03-19 NOTE — Assessment & Plan Note (Signed)
Endorses 40 yr hx of tobacco abuse. Recently cut-back usage to 1 cigarette per day. Encouraged complete cessation. Encouraged patient to identify triggers for more successful cessation. -Cessation encouraged -Encouraged to ID triggers -Informed patient of pharmacotherapy to assist him if desired.

## 2016-03-19 NOTE — Progress Notes (Signed)
   CC: Hospital follow-up of chronic pancreatitis, establish with primary care, hypertension  HPI:  [name] is a very pleasant 55 y.o. M with Mhx significant for HTN well-controlled on HCTZ 12.5 mg daily, chronic pancreatitis secondary to alcohol abuse with recent acute exacerbation requiring hospitalization and a symptomatic umbilical hernia. He presents here for hospital follow-up and to establish with a primary care provider. He was hospitalized 11/28 -11/29 for acute on chronic pancreatitis secondary to alcohol and reports he has quit drinking since discharge, was reportedly drinking 3-4 20 oz beers per day for as long as he could remember. He denies any abdominal pain, nausea or vomiting since d/c. Also reports he has been compliant with his HCTZ, lower-sodium diet and has decreased his tobacco use to 1 cigarette per day after dinner. Overall the patient feels much improved sugars since stopping drinking, he has regained his appetite and also gained approximately 10 pounds. His only complaint is a symptomatic reducible umbilical hernia which she's had for several years.  Past Medical History:  Diagnosis Date  . Aneurysm (HCC)   . Hypertension   . Pancreatitis 03/04/2016  . Umbilical hernia    symptomatic umbilical hernia/notes 03/04/2016   Review of Systems:  Review of Systems  Constitutional: Negative for chills, fever and weight loss (intentional weight gain).  Respiratory: Negative for cough and shortness of breath.   Cardiovascular: Negative for chest pain and leg swelling.  Gastrointestinal: Negative for abdominal pain, blood in stool, diarrhea, nausea and vomiting.  Neurological: Negative for dizziness, sensory change and headaches.  Psychiatric/Behavioral: Negative for depression and substance abuse. The patient is not nervous/anxious.    Physical Exam: Physical Exam  Constitutional: He is oriented to person, place, and time and well-developed, well-nourished, and in  no distress.  HENT:  Head: Normocephalic and atraumatic.  Eyes: Conjunctivae are normal. No scleral icterus.  Cardiovascular: Normal rate, regular rhythm and normal heart sounds.   Pulmonary/Chest: Effort normal and breath sounds normal. No respiratory distress.  Abdominal: Soft. Bowel sounds are normal. There is no tenderness. There is no rebound and no guarding.  Neurological: He is alert and oriented to person, place, and time. He exhibits normal muscle tone.  Skin: Skin is warm and dry. He is not diaphoretic.  Psychiatric: Mood, memory, affect and judgment normal.    Vitals:   03/19/16 0920  BP: 126/83  Pulse: 82  Temp: 98.1 F (36.7 C)  TempSrc: Oral  SpO2: 100%  Weight: 169 lb 3.2 oz (76.7 kg)  Height: 5\' 9"  (1.753 m)   Assessment & Plan:   See Encounters Tab for problem based charting.  Patient seen with Dr. Cyndie ChimeGranfortuna

## 2016-03-19 NOTE — Assessment & Plan Note (Signed)
Well-controlled on current therapy of HCTZ 12.5 mg daily. Pt reports compliance of this medication and denies any side effects including LH, dizziness or fatigue. Denies following a low-sodium diet.  -Continue current therapy with HCTZ 12.5mg , Refills sent.  -Encouraged a more heart healthy diet -Encouraged tobacco cessation, pt may eventually not require anti-hypertensives

## 2016-04-08 ENCOUNTER — Ambulatory Visit: Payer: Self-pay | Admitting: General Surgery

## 2016-04-08 NOTE — H&P (Signed)
Marco SavoyMichael W. Pittman 03/04/2016 11:28 AM Location: [facility] Patient #: [id number] DOB: 11/22/1960 Single / Language: [place] / Race: Black or African American Male   History of Present Illness Marco Pittman(Marco Aymond J. Marco Pittman; 03/04/2016 11:56 AM) The patient is a 56 year old male.  Note:He is referred by Dr. Patria Maneampos because of a symptomatic umbilical hernia. He has had this for some time. He was seen in the emergency department in August and noted to have a hernia. He states sometimes a hernia comes out and he feels sick and throws up and then he gets better within 3 or 4 days. He's been having some nausea, vomiting, diarrhea for the past 3-4 days. He's been passing gas. He did have a normal bowel movement yesterday. He says his urine is becoming dark.  Other Problems Marco Pittman(Marco Pittman; 03/04/2016 11:29 AM) Gastric Ulcer   Past Surgical History Marco Pittman(Marco Pittman; 03/04/2016 11:29 AM) No pertinent past surgical history   Diagnostic Studies History Marco Pittman(Marco Pittman; 03/04/2016 11:29 AM) Colonoscopy  never  Allergies Marco Pittman(Marco Pittman; 03/04/2016 11:30 AM) No Known Drug Allergies 03/04/2016  Medication History (Marco Pittman; 03/04/2016 11:30 AM) No Current Medications Medications Reconciled  Social History Marco Pittman(Marco Pittman; 03/04/2016 11:29 AM) Alcohol use  Moderate alcohol use. Caffeine use  Coffee, Tea. Illicit drug use  Uses socially only. Tobacco use  Current every day smoker.  Family History Marco Pittman(Marco Pittman; 03/04/2016 11:29 AM) Colon Cancer  Father. Diabetes Mellitus  Father, Mother. Hypertension  Father, Mother.    Review of Systems (Marco Pittman Pittman; 03/04/2016 11:29 AM) General Present- Appetite Loss, Fatigue and Weight Loss. Not Present- Chills, Fever, Night Sweats and Weight Gain. Skin Not Present- Change in Wart/Mole, Dryness, Hives, Jaundice, New Lesions, Non-Healing Wounds, Rash and Ulcer. HEENT Present- Nose Bleed and Wears  glasses/contact lenses. Not Present- Earache, Hearing Loss, Hoarseness, Oral Ulcers, Ringing in the Ears, Seasonal Allergies, Sinus Pain, Sore Throat, Visual Disturbances and Yellow Eyes. Respiratory Not Present- Bloody sputum, Chronic Cough, Difficulty Breathing, Snoring and Wheezing. Breast Not Present- Breast Mass, Breast Pain, Nipple Discharge and Skin Changes. Cardiovascular Present- Shortness of Breath. Not Present- Chest Pain, Difficulty Breathing Lying Down, Leg Cramps, Palpitations, Rapid Heart Rate and Swelling of Extremities. Gastrointestinal Present- Abdominal Pain, Bloating, Gets full quickly at meals, Nausea and Vomiting. Not Present- Bloody Stool, Change in Bowel Habits, Chronic diarrhea, Constipation, Difficulty Swallowing, Excessive gas, Hemorrhoids, Indigestion and Rectal Pain. Male Genitourinary Not Present- Blood in Urine, Change in Urinary Stream, Frequency, Impotence, Nocturia, Painful Urination, Urgency and Urine Leakage. Musculoskeletal Present- Muscle Pain. Not Present- Back Pain, Joint Pain, Joint Stiffness, Muscle Weakness and Swelling of Extremities. Neurological Not Present- Decreased Memory, Fainting, Headaches, Numbness, Seizures, Tingling, Tremor, Trouble walking and Weakness. Psychiatric Present- Change in Sleep Pattern. Not Present- Anxiety, Bipolar, Depression, Fearful and Frequent crying. Endocrine Present- Hot flashes. Not Present- Cold Intolerance, Excessive Hunger, Hair Changes, Heat Intolerance and New Diabetes. Hematology Not Present- Blood Thinners, Easy Bruising, Excessive bleeding, Gland problems, HIV and Persistent Infections.   Physical Exam Marco Pittman(Marco Pittman Marco Pittman; 03/04/2016 11:58 AM) The physical exam findings are as follows: Note:General: Thin, ill appearing male. Pleasant and cooperative.  CV: RRR, no murmur, no edema  RESP: Breath sounds equal and clear. Respirations nonlabored.  ABDOMEN: Soft, nondistended, no masses, no organomegaly, active  bowel sounds, no scars, easily reducible umbilical hernia with mild tenderness.  GU: No bulges.  MUSCULOSKELETAL: good muscle tone  NEUROLOGIC: Alert and oriented, answers questions appropriately.  PSYCHIATRIC:  Normal mood, affect , and behavior.    Assessment & Plan Marco Pittman; 03/04/2016 11:55 AM) UMBILICAL HERNIA WITHOUT OBSTRUCTION OR GANGRENE (K42.9) Impression: The hernia defect is small. The hernia simple and easily reduces. I do not feel that this is causing his nausea, vomiting, and diarrhea.  Plan: I'm concerned about dehydration and have told him he should go to the emergency department to be evaluated for his nausea, vomiting, and dehydration. Once the etiology of this has been discovered and treated, I told him I would be glad to fix his umbilical hernia area I have asked him to call back once the nausea and vomiting has resolved.  I have discussed the procedure, risks, and aftercare of umbilical hernia. Risks include but are not limited to bleeding, infection, wound healing problems, anesthesia, recurrence, injury to intra-abdominal organs, cosmetic deformity. He/she seems to understand and would like to proceed.  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  He was discharged 03/05/16.  He has called and wants to proceed with umbilical hernia repair.

## 2016-04-09 ENCOUNTER — Encounter (HOSPITAL_BASED_OUTPATIENT_CLINIC_OR_DEPARTMENT_OTHER): Payer: Self-pay | Admitting: *Deleted

## 2016-04-10 ENCOUNTER — Encounter (HOSPITAL_BASED_OUTPATIENT_CLINIC_OR_DEPARTMENT_OTHER)
Admission: RE | Admit: 2016-04-10 | Discharge: 2016-04-10 | Disposition: A | Discharge status: Home / Self Care | Payer: Medicaid Other | Source: Ambulatory Visit | Attending: General Surgery | Admitting: General Surgery

## 2016-04-10 ENCOUNTER — Other Ambulatory Visit: Payer: Self-pay

## 2016-04-10 DIAGNOSIS — I10 Essential (primary) hypertension: Secondary | ICD-10-CM | POA: Insufficient documentation

## 2016-04-10 LAB — BASIC METABOLIC PANEL
ANION GAP: 6 (ref 5–15)
BUN: 9 mg/dL (ref 6–20)
CHLORIDE: 106 mmol/L (ref 101–111)
CO2: 28 mmol/L (ref 22–32)
Calcium: 9.2 mg/dL (ref 8.9–10.3)
Creatinine, Ser: 0.92 mg/dL (ref 0.61–1.24)
GFR calc non Af Amer: >60 mL/min (ref >60)
Glucose, Bld: 96 mg/dL (ref 65–99)
POTASSIUM: 4.2 mmol/L (ref 3.5–5.1)
Sodium: 140 mmol/L (ref 135–145)

## 2016-04-14 ENCOUNTER — Ambulatory Visit (HOSPITAL_BASED_OUTPATIENT_CLINIC_OR_DEPARTMENT_OTHER): Payer: Medicaid Other | Admitting: Anesthesiology

## 2016-04-14 ENCOUNTER — Ambulatory Visit (HOSPITAL_BASED_OUTPATIENT_CLINIC_OR_DEPARTMENT_OTHER)
Admission: RE | Admit: 2016-04-14 | Discharge: 2016-04-14 | Disposition: A | Discharge status: Home / Self Care | Payer: Medicaid Other | Source: Ambulatory Visit | Attending: General Surgery | Admitting: General Surgery

## 2016-04-14 ENCOUNTER — Encounter (HOSPITAL_BASED_OUTPATIENT_CLINIC_OR_DEPARTMENT_OTHER)
Admission: RE | Disposition: A | Discharge status: Home / Self Care | Payer: Self-pay | Source: Ambulatory Visit | Attending: General Surgery

## 2016-04-14 ENCOUNTER — Encounter (HOSPITAL_BASED_OUTPATIENT_CLINIC_OR_DEPARTMENT_OTHER): Payer: Self-pay

## 2016-04-14 DIAGNOSIS — K429 Umbilical hernia without obstruction or gangrene: Secondary | ICD-10-CM | POA: Diagnosis present

## 2016-04-14 DIAGNOSIS — F17200 Nicotine dependence, unspecified, uncomplicated: Secondary | ICD-10-CM | POA: Insufficient documentation

## 2016-04-14 DIAGNOSIS — I10 Essential (primary) hypertension: Secondary | ICD-10-CM | POA: Insufficient documentation

## 2016-04-14 HISTORY — PX: UMBILICAL HERNIA REPAIR: SHX196

## 2016-04-14 HISTORY — DX: Alcohol abuse, uncomplicated: F10.10

## 2016-04-14 HISTORY — PX: INSERTION OF MESH: SHX5868

## 2016-04-14 SURGERY — REPAIR, HERNIA, UMBILICAL, ADULT
Anesthesia: General | Site: Abdomen

## 2016-04-14 MED ORDER — OXYCODONE HCL 5 MG PO TABS
5.0000 mg | ORAL_TABLET | ORAL | Status: DC | PRN
  Administered 2016-04-14: 5 mg via ORAL

## 2016-04-14 MED ORDER — EPHEDRINE 5 MG/ML INJ
INTRAVENOUS | Status: AC
  Filled 2016-04-14: qty 10

## 2016-04-14 MED ORDER — LIDOCAINE 2% (20 MG/ML) 5 ML SYRINGE
INTRAMUSCULAR | Status: DC | PRN
  Administered 2016-04-14: 80 mg via INTRAVENOUS

## 2016-04-14 MED ORDER — SCOPOLAMINE 1 MG/3DAYS TD PT72: 1.0000 | MEDICATED_PATCH | Freq: Once | TRANSDERMAL | Status: DC | PRN

## 2016-04-14 MED ORDER — MIDAZOLAM HCL 2 MG/2ML IJ SOLN
1.0000 mg | INTRAMUSCULAR | Status: DC | PRN
  Administered 2016-04-14: 2 mg via INTRAVENOUS

## 2016-04-14 MED ORDER — PROPOFOL 10 MG/ML IV BOLUS
INTRAVENOUS | Status: DC | PRN
  Administered 2016-04-14: 50 mg via INTRAVENOUS
  Administered 2016-04-14: 150 mg via INTRAVENOUS

## 2016-04-14 MED ORDER — MIDAZOLAM HCL 2 MG/2ML IJ SOLN
INTRAMUSCULAR | Status: AC
  Filled 2016-04-14: qty 2

## 2016-04-14 MED ORDER — OXYCODONE HCL 5 MG PO TABS: 5.0000 mg | ORAL_TABLET | ORAL | 0 refills | Status: DC | PRN

## 2016-04-14 MED ORDER — SUCCINYLCHOLINE CHLORIDE 200 MG/10ML IV SOSY
PREFILLED_SYRINGE | INTRAVENOUS | Status: DC | PRN
  Administered 2016-04-14: 100 mg via INTRAVENOUS

## 2016-04-14 MED ORDER — ONDANSETRON HCL 4 MG/2ML IJ SOLN
INTRAMUSCULAR | Status: DC | PRN
  Administered 2016-04-14: 4 mg via INTRAVENOUS

## 2016-04-14 MED ORDER — CHLORHEXIDINE GLUCONATE CLOTH 2 % EX PADS: 6.0000 | MEDICATED_PAD | Freq: Once | CUTANEOUS | Status: DC

## 2016-04-14 MED ORDER — PROPOFOL 10 MG/ML IV BOLUS
INTRAVENOUS | Status: AC
  Filled 2016-04-14: qty 20

## 2016-04-14 MED ORDER — ACETAMINOPHEN 650 MG RE SUPP: 650.0000 mg | RECTAL | Status: DC | PRN

## 2016-04-14 MED ORDER — SUCCINYLCHOLINE CHLORIDE 20 MG/ML IJ SOLN: INTRAMUSCULAR | Status: DC | PRN

## 2016-04-14 MED ORDER — SUCCINYLCHOLINE CHLORIDE 200 MG/10ML IV SOSY
PREFILLED_SYRINGE | INTRAVENOUS | Status: AC
  Filled 2016-04-14: qty 10

## 2016-04-14 MED ORDER — BUPIVACAINE HCL (PF) 0.5 % IJ SOLN
INTRAMUSCULAR | Status: DC | PRN
  Administered 2016-04-14: 15 mL

## 2016-04-14 MED ORDER — FENTANYL CITRATE (PF) 100 MCG/2ML IJ SOLN: 25.0000 ug | INTRAMUSCULAR | Status: DC | PRN

## 2016-04-14 MED ORDER — CEFAZOLIN SODIUM-DEXTROSE 2-4 GM/100ML-% IV SOLN
INTRAVENOUS | Status: AC
  Filled 2016-04-14: qty 100

## 2016-04-14 MED ORDER — ACETAMINOPHEN 325 MG PO TABS: 650.0000 mg | ORAL_TABLET | ORAL | Status: DC | PRN

## 2016-04-14 MED ORDER — LIDOCAINE 2% (20 MG/ML) 5 ML SYRINGE
INTRAMUSCULAR | Status: AC
  Filled 2016-04-14: qty 5

## 2016-04-14 MED ORDER — OXYCODONE HCL 5 MG PO TABS
ORAL_TABLET | ORAL | Status: AC
  Filled 2016-04-14: qty 1

## 2016-04-14 MED ORDER — LACTATED RINGERS IV SOLN
INTRAVENOUS | Status: DC
  Administered 2016-04-14 (×2): via INTRAVENOUS

## 2016-04-14 MED ORDER — EPHEDRINE SULFATE-NACL 50-0.9 MG/10ML-% IV SOSY
PREFILLED_SYRINGE | INTRAVENOUS | Status: DC | PRN
  Administered 2016-04-14 (×2): 15 mg via INTRAVENOUS

## 2016-04-14 MED ORDER — FENTANYL CITRATE (PF) 100 MCG/2ML IJ SOLN
INTRAMUSCULAR | Status: AC
  Filled 2016-04-14: qty 2

## 2016-04-14 MED ORDER — ONDANSETRON HCL 4 MG/2ML IJ SOLN
INTRAMUSCULAR | Status: AC
  Filled 2016-04-14: qty 2

## 2016-04-14 MED ORDER — PROMETHAZINE HCL 25 MG/ML IJ SOLN: 6.2500 mg | INTRAMUSCULAR | Status: DC | PRN

## 2016-04-14 MED ORDER — DEXAMETHASONE SODIUM PHOSPHATE 4 MG/ML IJ SOLN
INTRAMUSCULAR | Status: DC | PRN
  Administered 2016-04-14: 10 mg via INTRAVENOUS

## 2016-04-14 MED ORDER — FENTANYL CITRATE (PF) 100 MCG/2ML IJ SOLN
50.0000 ug | INTRAMUSCULAR | Status: AC | PRN
  Administered 2016-04-14 (×2): 50 ug via INTRAVENOUS
  Administered 2016-04-14: 100 ug via INTRAVENOUS

## 2016-04-14 MED ORDER — DEXAMETHASONE SODIUM PHOSPHATE 10 MG/ML IJ SOLN
INTRAMUSCULAR | Status: AC
  Filled 2016-04-14: qty 1

## 2016-04-14 MED ORDER — SODIUM CHLORIDE 0.9% FLUSH: 3.0000 mL | INTRAVENOUS | Status: DC | PRN

## 2016-04-14 MED ORDER — CEFAZOLIN SODIUM-DEXTROSE 2-4 GM/100ML-% IV SOLN
2.0000 g | INTRAVENOUS | Status: AC
  Administered 2016-04-14: 2 g via INTRAVENOUS

## 2016-04-14 SURGICAL SUPPLY — 61 items
APPLICATOR COTTON TIP 6IN STRL (MISCELLANEOUS) ×3 IMPLANT
BENZOIN TINCTURE PRP APPL 2/3 (GAUZE/BANDAGES/DRESSINGS) ×3 IMPLANT
BLADE CLIPPER SURG (BLADE) ×3 IMPLANT
BLADE SURG 10 STRL SS (BLADE) ×3 IMPLANT
BLADE SURG 15 STRL LF DISP TIS (BLADE) ×1 IMPLANT
BLADE SURG 15 STRL SS (BLADE) ×2
CANISTER SUCT 1200ML W/VALVE (MISCELLANEOUS) IMPLANT
CHLORAPREP W/TINT 26ML (MISCELLANEOUS) ×3 IMPLANT
CLEANER CAUTERY TIP 5X5 PAD (MISCELLANEOUS) ×1 IMPLANT
CLOSURE WOUND 1/2 X4 (GAUZE/BANDAGES/DRESSINGS) ×1
COVER BACK TABLE 60X90IN (DRAPES) ×3 IMPLANT
COVER MAYO STAND STRL (DRAPES) ×3 IMPLANT
DECANTER SPIKE VIAL GLASS SM (MISCELLANEOUS) ×3 IMPLANT
DRAPE INCISE IOBAN 66X45 STRL (DRAPES) ×3 IMPLANT
DRAPE LAPAROTOMY T 102X78X121 (DRAPES) ×3 IMPLANT
DRAPE UTILITY XL STRL (DRAPES) ×3 IMPLANT
DRSG TEGADERM 2-3/8X2-3/4 SM (GAUZE/BANDAGES/DRESSINGS) IMPLANT
DRSG TEGADERM 4X4.75 (GAUZE/BANDAGES/DRESSINGS) ×3 IMPLANT
DRSG TELFA 3X8 NADH (GAUZE/BANDAGES/DRESSINGS) ×3 IMPLANT
ELECT COATED BLADE 2.86 ST (ELECTRODE) ×3 IMPLANT
ELECT REM PT RETURN 9FT ADLT (ELECTROSURGICAL) ×3
ELECTRODE REM PT RTRN 9FT ADLT (ELECTROSURGICAL) ×1 IMPLANT
GAUZE SPONGE 4X4 16PLY XRAY LF (GAUZE/BANDAGES/DRESSINGS) IMPLANT
GLOVE BIOGEL PI IND STRL 7.0 (GLOVE) ×2 IMPLANT
GLOVE BIOGEL PI IND STRL 8.5 (GLOVE) ×1 IMPLANT
GLOVE BIOGEL PI INDICATOR 7.0 (GLOVE) ×4
GLOVE BIOGEL PI INDICATOR 8.5 (GLOVE) ×2
GLOVE ECLIPSE 8.0 STRL XLNG CF (GLOVE) ×3 IMPLANT
GLOVE SURG SYN 7.5  E (GLOVE) ×2
GLOVE SURG SYN 7.5 E (GLOVE) ×1 IMPLANT
GOWN STRL REUS W/ TWL LRG LVL3 (GOWN DISPOSABLE) ×1 IMPLANT
GOWN STRL REUS W/ TWL XL LVL3 (GOWN DISPOSABLE) ×1 IMPLANT
GOWN STRL REUS W/TWL LRG LVL3 (GOWN DISPOSABLE) ×2
GOWN STRL REUS W/TWL XL LVL3 (GOWN DISPOSABLE) ×2
MESH HERNIA 3X6 (Mesh General) ×3 IMPLANT
NDL SUT 6 .5 CRC .975X.05 MAYO (NEEDLE) IMPLANT
NEEDLE HYPO 25X1 1.5 SAFETY (NEEDLE) ×3 IMPLANT
NEEDLE MAYO TAPER (NEEDLE)
NS IRRIG 1000ML POUR BTL (IV SOLUTION) ×3 IMPLANT
PACK BASIN DAY SURGERY FS (CUSTOM PROCEDURE TRAY) ×3 IMPLANT
PAD CLEANER CAUTERY TIP 5X5 (MISCELLANEOUS) ×2
PENCIL BUTTON HOLSTER BLD 10FT (ELECTRODE) ×3 IMPLANT
SLEEVE SCD COMPRESS KNEE MED (MISCELLANEOUS) ×3 IMPLANT
SPONGE GAUZE 4X4 12PLY STER LF (GAUZE/BANDAGES/DRESSINGS) IMPLANT
SPONGE INTESTINAL PEANUT (DISPOSABLE) IMPLANT
STRIP CLOSURE SKIN 1/2X4 (GAUZE/BANDAGES/DRESSINGS) ×2 IMPLANT
SUT MON AB 4-0 PC3 18 (SUTURE) ×3 IMPLANT
SUT NOVA NAB DX-16 0-1 5-0 T12 (SUTURE) ×3 IMPLANT
SUT PROLENE 0 CT 2 (SUTURE) IMPLANT
SUT PROLENE 2 0 CT2 30 (SUTURE) IMPLANT
SUT SURG 0 T 19/GS 22 1969 62 (SUTURE) IMPLANT
SUT VIC AB 2-0 SH 27 (SUTURE)
SUT VIC AB 2-0 SH 27XBRD (SUTURE) IMPLANT
SUT VIC AB 3-0 SH 27 (SUTURE) ×2
SUT VIC AB 3-0 SH 27X BRD (SUTURE) ×1 IMPLANT
SYR CONTROL 10ML LL (SYRINGE) ×3 IMPLANT
TOWEL OR 17X24 6PK STRL BLUE (TOWEL DISPOSABLE) ×3 IMPLANT
TOWEL OR NON WOVEN STRL DISP B (DISPOSABLE) ×3 IMPLANT
TUBE CONNECTING 20'X1/4 (TUBING)
TUBE CONNECTING 20X1/4 (TUBING) IMPLANT
YANKAUER SUCT BULB TIP NO VENT (SUCTIONS) IMPLANT

## 2016-04-14 NOTE — Discharge Instructions (Addendum)
Post Anesthesia Home Care Instructions  Activity: Get plenty of rest for the remainder of the day. A responsible adult should stay with you for 24 hours following the procedure.  For the next 24 hours, DO NOT: -Drive a car -Advertising copywriterperate machinery -Drink alcoholic beverages -Take any medication unless instructed by your physician -Make any legal decisions or sign important papers.  Meals: Start with liquid foods such as gelatin or soup. Progress to regular foods as tolerated. Avoid greasy, spicy, heavy foods. If nausea and/or vomiting occur, drink only clear liquids until the nausea and/or vomiting subsides. Call your physician if vomiting continues.  Special Instructions/Symptoms: Your throat may feel dry or sore from the anesthesia or the breathing tube placed in your throat during surgery. If this causes discomfort, gargle with warm salt water. The discomfort should disappear within 24 hours.  If you had a scopolamine patch placed behind your ear for the management of post- operative nausea and/or vomiting:  1. The medication in the patch is effective for 72 hours, after which it should be removed.  Wrap patch in a tissue and discard in the trash. Wash hands thoroughly with soap and water. 2. You may remove the patch earlier than 72 hours if you experience unpleasant side effects which may include dry mouth, dizziness or visual disturbances. 3. Avoid touching the patch. Wash your hands with soap and water after contact with the patch.   Call your surgeon if you experience:   1.  Fever over 101.0. 2.  Inability to urinate. 3.  Nausea and/or vomiting. 4.  Extreme swelling or bruising at the surgical site. 5.  Continued bleeding from the incision. 6.  Increased pain, redness or drainage from the incision. 7.  Problems related to your pain medication. 8.  Any problems and/or concerns    CC[facility] _______[facility]  UMBILICAL OR INGUINAL HERNIA REPAIR: POST OP  INSTRUCTIONS  Always review your discharge instruction sheet given to you by the facility where your surgery was performed. IF YOU HAVE DISABILITY OR FAMILY LEAVE FORMS, YOU MUST BRING THEM TO THE OFFICE FOR PROCESSING.   DO NOT GIVE THEM TO YOUR DOCTOR.  1. A  prescription for pain medication may be given to you upon discharge.  Take your pain medication as prescribed, if needed.  If narcotic pain medicine is not needed, then you may take acetaminophen (Tylenol) or ibuprofen (Advil) as needed. 2. Take your usually prescribed medications unless otherwise directed. 3. If you need a refill on your pain medication, please contact your pharmacy.  They will contact our office to request authorization. Prescriptions will not be filled after 5 pm or on week-ends. 4. You should follow a light diet the first 24 hours after arrival home, such as soup and crackers, etc.  Be sure to include lots of fluids daily.  Resume your normal diet the day after surgery. 5. Most patients will experience some swelling and bruising around the umbilicus.  Ice packs and reclining will help.  Swelling and bruising can take several days to resolve. Do not lie flat for 3-4 days. 6. It is common to experience some constipation if taking pain medication after surgery.  Increasing fluid intake and taking a stool softener (such as Colace) will usually help or prevent this problem from occurring.  A mild laxative (Milk of Magnesia or Miralax) should be taken according to package directions if there are no bowel movements after 48 hours. 7. Unless discharge instructions indicate otherwise, you may remove your  bandages 72 hours after surgery.  You may shower the day after surgery.  You may have steri-strips (small skin tapes) in place directly over the incision.  These strips should be left on the skin until they fall off.  If your surgeon used skin glue on the incision, you may shower in 24 hours.  The glue will flake off over the next 2-3  weeks.  Any sutures or staples will be removed at the office during your follow-up visit. 8. ACTIVITIES:  You may resume light daily activities beginning the next day--such as daily self-care, walking, climbing stairs--gradually increasing activities as tolerated. Do not lie flat for the first 2-3 days. You may have sexual intercourse when it is comfortable.  Refrain from any heavy lifting or straining-nothing over 10 pounds for 6 weeks.   a. You may drive when you are no longer taking prescription pain medication, you can comfortably wear a seatbelt, and you can safely maneuver your car and apply brakes. b. RETURN TO WORK:  _Desk type work in one week, full duty in 6 weeks._________________________________________________________ 9. You should see your doctor in the office for a follow-up appointment approximately 2-3 weeks after your surgery.  Make sure that you call for this appointment within a day or two after you arrive home to insure a convenient appointment time. 10. OTHER INSTRUCTIONS:  __________________________________________________________________________________________________________________________________________________________________________________________  WHEN TO CALL YOUR DOCTOR: 1. Fever over 101.0 2. Inability to urinate 3. Nausea and/or vomiting 4. Extreme swelling or bruising 5. Continued bleeding from incision. 6. Increased pain, redness, or drainage from the incision  The clinic staff is available to answer your questions during regular business hours.  Please dont hesitate to call and ask to speak to one of the nurses for clinical concerns.  If you have a medical emergency, go to the nearest emergency room or call 911.  A surgeon from [facility] is always on call at the hospital   [address], [place], [place]  [address] ?  [address] [address], [place], [place]   [address] [phone] ? [phone] ? FAX [phone] Web site:  [email or web address]

## 2016-04-14 NOTE — Op Note (Signed)
OPERATIVE NOTE- UMBILICAL HERNIA REPAIR  Preoperative diagnosis: Umbilical hernia  Postoperative diagnosis: Same  Procedure: Umbilical hernia repair with mesh.  Surgeon: Avel Peaceodd Jeziah Kretschmer M.D.  Anesthesia: General plus Marcaine local  EBL:  < 100 ml   Indication:This is a 56 year old male with a symptomatic umbilical hernia who presents for elective repair.  Technique: He was seen in the holding room and then brought to the operating room, placed supine on the operating table, and a general anesthetic was given.  The hair in the periumbilical area was clipped as was necessary.  The periumbilical area was sterilely prepped and draped. A timeout was performed.  Marcaine solution was infiltrated superficially and deep in the periumbilical area. A subumbilical transverse incision was made through the skin and subcutaneous tissue until the fascia was identified. The subcutaneous tissue was mobilized free from the fascia inferiorly and laterally. The umbilical stalk was mobilized and dissected free from the fascia exposing the underlying hernia defect. The hernia sac and tissue within it (part of the large intestine) were reduced back into the abdominal cavity.  The subcutaneous tissue was freed from the fascia for 3-4 cm around the primary defect. The primary defect was then closed with interrupted #1 Novofil sutures. The sutures were left long.  A piece of polypropylene mesh was brought into the field. The primary repair sutures were threaded up through the mesh and tied down anchoring the mesh directly over the primary repair. The periphery of the mesh was then anchored to the fascia with a running 0 Prolene suture to allow for 3-4 cm of mesh overlap. The excess mesh was trimmed and removed.  Hemostasis was adequate at this time. The umbilicus was reimplanted onto the mesh and fascia with 3-0 Vicryl suture. The subcutaneous tissue was closed over the mesh with a running 3-0 Vicryl suture. The skin  was closed with a 4-0 Monocryl subcuticular stitch. Steri-Strips and sterile dressings were applied.  He tolerated the procedure well without any apparent complications and was taken to the recovery room in satisfactory condition.

## 2016-04-14 NOTE — Anesthesia Procedure Notes (Signed)
Procedure Name: Intubation Date/Time: 04/14/2016 12:09 PM Performed by: Lyndee Leo Pre-anesthesia Checklist: Patient identified, Emergency Drugs available, Suction available and Patient being monitored Patient Re-evaluated:Patient Re-evaluated prior to inductionOxygen Delivery Method: Circle system utilized Preoxygenation: Pre-oxygenation with 100% oxygen Intubation Type: IV induction Ventilation: Mask ventilation without difficulty Laryngoscope Size: Mac and 3 Tube type: Oral Number of attempts: 1 Airway Equipment and Method: Stylet and Oral airway Placement Confirmation: ETT inserted through vocal cords under direct vision,  positive ETCO2 and breath sounds checked- equal and bilateral Secured at: 22 cm Tube secured with: Tape Dental Injury: Teeth and Oropharynx as per pre-operative assessment

## 2016-04-14 NOTE — Transfer of Care (Signed)
Immediate Anesthesia Transfer of Care Note  Patient: Marco SavoyMichael W Pittman  Procedure(s) Performed: Procedure(s): UMBILICAL HERNIA REPAIR WITH MESH (N/A) INSERTION OF MESH (N/A)  Patient Location: PACU  Anesthesia Type:General  Level of Consciousness: sedated, patient cooperative and confused  Airway & Oxygen Therapy: Patient Spontanous Breathing and Patient connected to face mask oxygen  Post-op Assessment: Report given to RN and Post -op Vital signs reviewed and stable  Post vital signs: Reviewed and stable  Last Vitals:  Vitals:   04/14/16 1049  BP: 138/90  Pulse: 73  Resp: 20  Temp: 36.8 C    Last Pain:  Vitals:   04/14/16 1049  TempSrc: Oral         Complications: No apparent anesthesia complications

## 2016-04-14 NOTE — Anesthesia Preprocedure Evaluation (Signed)
Anesthesia Evaluation  Patient identified by MRN, date of birth, ID band Patient awake    Reviewed: Allergy & Precautions, NPO status , Patient's Chart, lab work & pertinent test results  Airway Mallampati: II  TM Distance: >3 FB Neck ROM: Full    Dental  (+) Teeth Intact, Dental Advisory Given, Missing, Chipped,    Pulmonary Current Smoker,    Pulmonary exam normal breath sounds clear to auscultation       Cardiovascular hypertension, Pt. on medications (-) angina(-) CAD, (-) Past MI and (-) CHF Normal cardiovascular exam Rhythm:Regular Rate:Normal     Neuro/Psych aneurysm negative psych ROS   GI/Hepatic negative GI ROS, Neg liver ROS,   Endo/Other  negative endocrine ROS  Renal/GU negative Renal ROS     Musculoskeletal negative musculoskeletal ROS (+)   Abdominal   Peds  Hematology negative hematology ROS (+)   Anesthesia Other Findings Day of surgery medications reviewed with the patient.  Reproductive/Obstetrics                             Anesthesia Physical Anesthesia Plan  ASA: III  Anesthesia Plan: General   Post-op Pain Management:    Induction: Intravenous  Airway Management Planned: Oral ETT  Additional Equipment:   Intra-op Plan:   Post-operative Plan: Extubation in OR  Informed Consent: I have reviewed the patients History and Physical, chart, labs and discussed the procedure including the risks, benefits and alternatives for the proposed anesthesia with the patient or authorized representative who has indicated his/her understanding and acceptance.   Dental advisory given  Plan Discussed with: CRNA  Anesthesia Plan Comments: (Risks/benefits of general anesthesia discussed with patient including risk of damage to teeth, lips, gum, and tongue, nausea/vomiting, allergic reactions to medications, and the possibility of heart attack, stroke and death.  All patient  questions answered.  Patient wishes to proceed.)        Anesthesia Quick Evaluation

## 2016-04-14 NOTE — Interval H&P Note (Signed)
History and Physical Interval Note:  04/14/2016 11:53 AM  Marco SavoyMichael W Pittman  has presented today for surgery, with the diagnosis of umbilical hernia  The various methods of treatment have been discussed with the patient and family. After consideration of risks, benefits and other options for treatment, the patient has consented to  Procedure(s): UMBILICAL HERNIA REPAIR WITH MESH (N/A) INSERTION OF MESH (N/A) as a surgical intervention .  The patient's history has been reviewed, patient examined, no change in status, stable for surgery.  I have reviewed the patient's chart and labs.  Questions were answered to the patient's satisfaction.     Ainslee Sou JShela Commons

## 2016-04-14 NOTE — H&P (View-Only) (Signed)
Marco SavoyMichael W. Pittman 03/04/2016 11:28 AM Location: [facility] Patient #: [id number] DOB: 11/22/1960 Single / Language: [place] / Race: Black or African American Male   History of Present Illness Marco Pittman(Ebert Forrester J. Dewell Monnier MD; 03/04/2016 11:56 AM) The patient is a 56 year old male.  Note:He is referred by Dr. Patria Maneampos because of a symptomatic umbilical hernia. He has had this for some time. He was seen in the emergency department in August and noted to have a hernia. He states sometimes a hernia comes out and he feels sick and throws up and then he gets better within 3 or 4 days. He's been having some nausea, vomiting, diarrhea for the past 3-4 days. He's been passing gas. He did have a normal bowel movement yesterday. He says his urine is becoming dark.  Other Problems [name]([name], CMA; 03/04/2016 11:29 AM) Gastric Ulcer   Past Surgical History [name]([name], CMA; 03/04/2016 11:29 AM) No pertinent past surgical history   Diagnostic Studies History [name]([name], CMA; 03/04/2016 11:29 AM) Colonoscopy  never  Allergies [name]([name], CMA; 03/04/2016 11:30 AM) No Known Drug Allergies 03/04/2016  Medication History (Armen Sherrine MaplesGlenn, CMA; 03/04/2016 11:30 AM) No Current Medications Medications Reconciled  Social History [name]([name], CMA; 03/04/2016 11:29 AM) Alcohol use  Moderate alcohol use. Caffeine use  Coffee, Tea. Illicit drug use  Uses socially only. Tobacco use  Current every day smoker.  Family History [name]([name], CMA; 03/04/2016 11:29 AM) Colon Cancer  Father. Diabetes Mellitus  Father, Mother. Hypertension  Father, Mother.    Review of Systems (Armen Sherrine MaplesGlenn CMA; 03/04/2016 11:29 AM) General Present- Appetite Loss, Fatigue and Weight Loss. Not Present- Chills, Fever, Night Sweats and Weight Gain. Skin Not Present- Change in Wart/Mole, Dryness, Hives, Jaundice, New Lesions, Non-Healing Wounds, Rash and Ulcer. HEENT Present- Nose Bleed and Wears  glasses/contact lenses. Not Present- Earache, Hearing Loss, Hoarseness, Oral Ulcers, Ringing in the Ears, Seasonal Allergies, Sinus Pain, Sore Throat, Visual Disturbances and Yellow Eyes. Respiratory Not Present- Bloody sputum, Chronic Cough, Difficulty Breathing, Snoring and Wheezing. Breast Not Present- Breast Mass, Breast Pain, Nipple Discharge and Skin Changes. Cardiovascular Present- Shortness of Breath. Not Present- Chest Pain, Difficulty Breathing Lying Down, Leg Cramps, Palpitations, Rapid Heart Rate and Swelling of Extremities. Gastrointestinal Present- Abdominal Pain, Bloating, Gets full quickly at meals, Nausea and Vomiting. Not Present- Bloody Stool, Change in Bowel Habits, Chronic diarrhea, Constipation, Difficulty Swallowing, Excessive gas, Hemorrhoids, Indigestion and Rectal Pain. Male Genitourinary Not Present- Blood in Urine, Change in Urinary Stream, Frequency, Impotence, Nocturia, Painful Urination, Urgency and Urine Leakage. Musculoskeletal Present- Muscle Pain. Not Present- Back Pain, Joint Pain, Joint Stiffness, Muscle Weakness and Swelling of Extremities. Neurological Not Present- Decreased Memory, Fainting, Headaches, Numbness, Seizures, Tingling, Tremor, Trouble walking and Weakness. Psychiatric Present- Change in Sleep Pattern. Not Present- Anxiety, Bipolar, Depression, Fearful and Frequent crying. Endocrine Present- Hot flashes. Not Present- Cold Intolerance, Excessive Hunger, Hair Changes, Heat Intolerance and New Diabetes. Hematology Not Present- Blood Thinners, Easy Bruising, Excessive bleeding, Gland problems, HIV and Persistent Infections.   Physical Exam Marco Pittman(Alazae Crymes J. Jayley Hustead MD; 03/04/2016 11:58 AM) The physical exam findings are as follows: Note:General: Thin, ill appearing male. Pleasant and cooperative.  CV: RRR, no murmur, no edema  RESP: Breath sounds equal and clear. Respirations nonlabored.  ABDOMEN: Soft, nondistended, no masses, no organomegaly, active  bowel sounds, no scars, easily reducible umbilical hernia with mild tenderness.  GU: No bulges.  MUSCULOSKELETAL: good muscle tone  NEUROLOGIC: Alert and oriented, answers questions appropriately.  PSYCHIATRIC:  Normal mood, affect , and behavior.    Assessment & Plan Marco Pollack MD; 03/04/2016 11:55 AM) UMBILICAL HERNIA WITHOUT OBSTRUCTION OR GANGRENE (K42.9) Impression: The hernia defect is small. The hernia simple and easily reduces. I do not feel that this is causing his nausea, vomiting, and diarrhea.  Plan: I'm concerned about dehydration and have told him he should go to the emergency department to be evaluated for his nausea, vomiting, and dehydration. Once the etiology of this has been discovered and treated, I told him I would be glad to fix his umbilical hernia area I have asked him to call back once the nausea and vomiting has resolved.  I have discussed the procedure, risks, and aftercare of umbilical hernia. Risks include but are not limited to bleeding, infection, wound healing problems, anesthesia, recurrence, injury to intra-abdominal organs, cosmetic deformity. He/she seems to understand and would like to proceed.  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  He was discharged 03/05/16.  He has called and wants to proceed with umbilical hernia repair.

## 2016-04-14 NOTE — Anesthesia Postprocedure Evaluation (Signed)
Anesthesia Post Note  Patient: Marco Pittman  Procedure(s) Performed: Procedure(s) (LRB): UMBILICAL HERNIA REPAIR WITH MESH (N/A) INSERTION OF MESH (N/A)  Patient location during evaluation: PACU Anesthesia Type: General Level of consciousness: awake and alert Pain management: pain level controlled Vital Signs Assessment: post-procedure vital signs reviewed and stable Respiratory status: spontaneous breathing, nonlabored ventilation, respiratory function stable and patient connected to nasal cannula oxygen Cardiovascular status: blood pressure returned to baseline and stable Postop Assessment: no signs of nausea or vomiting Anesthetic complications: no       Last Vitals:  Vitals:   04/14/16 1345 04/14/16 1407  BP: 130/85 133/83  Pulse: 99 92  Resp: 17 20  Temp:  36.6 C    Last Pain:  Vitals:   04/14/16 1407  TempSrc:   PainSc: 0-No pain                 Cecile HearingStephen Edward Turk

## 2016-04-15 ENCOUNTER — Encounter (HOSPITAL_BASED_OUTPATIENT_CLINIC_OR_DEPARTMENT_OTHER): Payer: Self-pay | Admitting: General Surgery

## 2016-09-03 ENCOUNTER — Other Ambulatory Visit: Payer: Self-pay | Admitting: Internal Medicine

## 2016-09-03 DIAGNOSIS — I10 Essential (primary) hypertension: Secondary | ICD-10-CM

## 2016-09-15 ENCOUNTER — Encounter: Payer: Self-pay | Admitting: *Deleted

## 2016-09-16 ENCOUNTER — Ambulatory Visit: Payer: Self-pay | Admitting: Internal Medicine

## 2016-10-10 ENCOUNTER — Ambulatory Visit: Payer: Medicaid Other | Attending: Internal Medicine | Admitting: Internal Medicine

## 2016-10-10 ENCOUNTER — Encounter: Payer: Self-pay | Admitting: Internal Medicine

## 2016-10-10 VITALS — BP 131/87 | HR 94 | Temp 98.5°F | Resp 16 | Wt 161.6 lb

## 2016-10-10 DIAGNOSIS — R634 Abnormal weight loss: Secondary | ICD-10-CM | POA: Diagnosis not present

## 2016-10-10 DIAGNOSIS — F17200 Nicotine dependence, unspecified, uncomplicated: Secondary | ICD-10-CM | POA: Diagnosis not present

## 2016-10-10 DIAGNOSIS — Z131 Encounter for screening for diabetes mellitus: Secondary | ICD-10-CM | POA: Diagnosis not present

## 2016-10-10 DIAGNOSIS — Z125 Encounter for screening for malignant neoplasm of prostate: Secondary | ICD-10-CM | POA: Diagnosis not present

## 2016-10-10 DIAGNOSIS — I10 Essential (primary) hypertension: Secondary | ICD-10-CM | POA: Diagnosis not present

## 2016-10-10 DIAGNOSIS — K860 Alcohol-induced chronic pancreatitis: Secondary | ICD-10-CM | POA: Insufficient documentation

## 2016-10-10 DIAGNOSIS — Z9889 Other specified postprocedural states: Secondary | ICD-10-CM | POA: Diagnosis not present

## 2016-10-10 DIAGNOSIS — Z1159 Encounter for screening for other viral diseases: Secondary | ICD-10-CM | POA: Diagnosis not present

## 2016-10-10 DIAGNOSIS — Z800 Family history of malignant neoplasm of digestive organs: Secondary | ICD-10-CM | POA: Diagnosis not present

## 2016-10-10 DIAGNOSIS — M25512 Pain in left shoulder: Secondary | ICD-10-CM | POA: Diagnosis not present

## 2016-10-10 DIAGNOSIS — Z79899 Other long term (current) drug therapy: Secondary | ICD-10-CM | POA: Diagnosis not present

## 2016-10-10 DIAGNOSIS — Z6823 Body mass index (BMI) 23.0-23.9, adult: Secondary | ICD-10-CM | POA: Insufficient documentation

## 2016-10-10 DIAGNOSIS — Z1211 Encounter for screening for malignant neoplasm of colon: Secondary | ICD-10-CM | POA: Diagnosis not present

## 2016-10-10 DIAGNOSIS — F17210 Nicotine dependence, cigarettes, uncomplicated: Secondary | ICD-10-CM | POA: Diagnosis not present

## 2016-10-10 LAB — POCT GLYCOSYLATED HEMOGLOBIN (HGB A1C): HEMOGLOBIN A1C: 6.1

## 2016-10-10 NOTE — Patient Instructions (Signed)
Purchased and drink Ensure or Glucerna shakes to supplement meals. Do not be afraid to eat because of the surgical mesh present in your abdomen.  Try to avoid heavy lifting.  Call 1-800-Quit Now and request the nicotine patches.   Prediabetes Prediabetes is the condition of having a blood sugar (blood glucose) level that is higher than it should be, but not high enough for you to be diagnosed with type 2 diabetes. Having prediabetes puts you at risk for developing type 2 diabetes (type 2 diabetes mellitus). Prediabetes may be called impaired glucose tolerance or impaired fasting glucose. Prediabetes usually does not cause symptoms. Your health care provider can diagnose this condition with blood tests. You may be tested for prediabetes if you are overweight and if you have at least one other risk factor for prediabetes. Risk factors for prediabetes include:  Having a family member with type 2 diabetes.  Being overweight or obese.  Being older than age 69.  Being of American-Indian, African-American, Hispanic/Latino, or Asian/Pacific Islander descent.  Having an inactive (sedentary) lifestyle.  Having a history of gestational diabetes or polycystic ovarian syndrome (PCOS).  Having low levels of good cholesterol (HDL-C) or high levels of blood fats (triglycerides).  Having high blood pressure.  What is blood glucose and how is blood glucose measured?  Blood glucose refers to the amount of glucose in your bloodstream. Glucose comes from eating foods that contain sugars and starches (carbohydrates) that the body breaks down into glucose. Your blood glucose level may be measured in mg/dL (milligrams per deciliter) or mmol/L (millimoles per liter).Your blood glucose may be checked with one or more of the following blood tests:  A fasting blood glucose (FBG) test. You will not be allowed to eat (you will fast) for at least 8 hours before a blood sample is taken. ? A normal range for FBG is  70-100 mg/dl (4.0-9.8 mmol/L).  An A1c (hemoglobin A1c) blood test. This test provides information about blood glucose control over the previous 2?3months.  An oral glucose tolerance test (OGTT). This test measures your blood glucose twice: ? After fasting. This is your baseline level. ? Two hours after you drink a beverage that contains glucose.  You may be diagnosed with prediabetes:  If your FBG is 100?125 mg/dL (1.1-9.1 mmol/L).  If your A1c level is 5.7?6.4%.  If your OGGT result is 140?199 mg/dL (4.7-82 mmol/L).  These blood tests may be repeated to confirm your diagnosis. What happens if blood glucose is too high? The pancreas produces a hormone (insulin) that helps move glucose from the bloodstream into cells. When cells in the body do not respond properly to insulin that the body makes (insulin resistance), excess glucose builds up in the blood instead of going into cells. As a result, high blood glucose (hyperglycemia) can develop, which can cause many complications. This is a symptom of prediabetes. What can happen if blood glucose stays higher than normal for a long time? Having high blood glucose for a long time is dangerous. Too much glucose in your blood can damage your nerves and blood vessels. Long-term damage can lead to complications from diabetes, which may include:  Heart disease.  Stroke.  Blindness.  Kidney disease.  Depression.  Poor circulation in the feet and legs, which could lead to surgical removal (amputation) in severe cases.  How can prediabetes be prevented from turning into type 2 diabetes?  To help prevent type 2 diabetes, take the following actions:  Be physically active. ? Do  moderate-intensity physical activity for at least 30 minutes on at least 5 days of the week, or as much as told by your health care provider. This could be brisk walking, biking, or water aerobics. ? Ask your health care provider what activities are safe for you. A  mix of physical activities may be best, such as walking, swimming, cycling, and strength training.  Lose weight as told by your health care provider. ? Losing 5-7% of your body weight can reverse insulin resistance. ? Your health care provider can determine how much weight loss is best for you and can help you lose weight safely.  Follow a healthy meal plan. This includes eating lean proteins, complex carbohydrates, fresh fruits and vegetables, low-fat dairy products, and healthy fats. ? Follow instructions from your health care provider about eating or drinking restrictions. ? Make an appointment to see a diet and nutrition specialist (registered dietitian) to help you create a healthy eating plan that is right for you.  Do not smoke or use any tobacco products, such as cigarettes, chewing tobacco, and e-cigarettes. If you need help quitting, ask your health care provider.  Take over-the-counter and prescription medicines as told by your health care provider. You may be prescribed medicines that help lower the risk of type 2 diabetes.  This information is not intended to replace advice given to you by your health care provider. Make sure you discuss any questions you have with your health care provider. Document Released: 07/16/2015 Document Revised: 08/30/2015 Document Reviewed: 05/15/2015 Elsevier Interactive Patient Education  [employer]2018 Elsevier Inc.

## 2016-10-10 NOTE — Progress Notes (Addendum)
Patient ID: Marco Pittman, male    DOB: 01/16/1961  MRN: [id number]  CC: re-establish; Hypertension; and Referral   Subjective: Marco Pittman is a 56 y.o. male who presents to est care His concerns today include:  Hx of HTN, chronic pancreatitis due to ETOH, tob use.  1. Request referral for colonoscopy for colon CA screening.  Father died colon CA at age 44. -moving bowels ok. No blood in sttools  2. Had umbilical hernia repair 1 mth ago -since then has had 10 lbs unintentional wgh loss -has appetite but when he sits down to eat, would only take a few bites.  "Just doesn't taste right in my mouth." -denies dysphagia, abdominal pain or nausea.  He can feel the mesh around the umbilicus and  Is afraid that it will tear open if he eats. -drinks beer every now and then -denies feeling depressed.  3. HTN: Compliant with HCTZ and salt limitation. -no CP/SOB/LE edema/HA  4. C/o aching in LT shoulder posteriorly x few wks.  Using [place] which helps -no injury.  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 -hurts only "when I'm trying to sleep.  I have to turn over on the other side."  Takes Advil sometimes  5.  Changed from cigar to cigarettes. -5-6 cigarettes a day.  Use to smoke 3-4 cigars a day.  Smoked since age 18 -wants to quit; he has decreased but never able to.  Patient Active Problem List   Diagnosis Date Noted  . Diabetes mellitus screening 10/10/2016  . Weight loss, unintentional 10/10/2016  . Tobacco abuse 03/19/2016  . History of alcohol abuse 03/19/2016  . Chronic pancreatitis (HCC) 03/04/2016  . Essential hypertension, benign 10/06/2012     Current Outpatient Prescriptions on File Prior to Visit  Medication Sig Dispense Refill  . feeding supplement (BOOST / RESOURCE BREEZE) LIQD Take 1 Container by mouth 3 (three) times daily between meals. (Patient not taking: Reported on 10/10/2016) 237 mL 3  . hydrochlorothiazide (MICROZIDE) 12.5 MG capsule TAKE ONE  CAPSULE BY MOUTH EVERY DAY 30 capsule 1   No current facility-administered medications on file prior to visit.     No Known Allergies  Social History   Social History  . Marital status: Single    Spouse name: N/A  . Number of children: N/A  . Years of education: N/A   Occupational History  . Not on file.   Social History Main Topics  . Smoking status: Current Every Day Smoker    Packs/day: 0.50    Years: 45.00    Types: Cigars  . Smokeless tobacco: Never Used  . Alcohol use 8.4 oz/week    14 Cans of beer per week     Comment: last alcohol on 03/29/16  . Drug use: No  . Sexual activity: Yes   Other Topics Concern  . Not on file   Social History Narrative  . No narrative on file    Family History  Problem Relation Age of Onset  . Cancer Father 56       colon    Past Surgical History:  Procedure Laterality Date  . INSERTION OF MESH N/A 04/14/2016   Procedure: INSERTION OF MESH;  Surgeon: Avel Peace, MD;  Location: [facility];  Service: General;  Laterality: N/A;  . NO PAST SURGERIES    . UMBILICAL HERNIA REPAIR N/A 04/14/2016   Procedure: UMBILICAL HERNIA REPAIR WITH MESH;  Surgeon: Avel Peace, MD;  Location: [facility];  Service: General;  Laterality: N/A;    ROS: Review of Systems -as above PHYSICAL EXAM: BP 131/87   Pulse 94   Temp 98.5 F (36.9 C) (Oral)   Resp 16   Wt 161 lb 9.6 oz (73.3 kg)   SpO2 98%   BMI 23.86 kg/m   Wt Readings from Last 3 Encounters:  10/10/16 161 lb 9.6 oz (73.3 kg)  04/14/16 171 lb (77.6 kg)  03/19/16 169 lb 3.2 oz (76.7 kg)    Physical Exam  General appearance - alert, well appearing, middle age AAM and in no distress Mental status - alert, oriented to person, place, and time, normal mood, behavior, speech, dress, motor activity, and thought processes Mouth - mucous membranes moist, pharynx normal without lesions Neck - supple, no significant adenopathy. No thyroid  enlargement Chest - clear to auscultation, no wheezes, rales or rhonchi, symmetric air entry Heart - normal rate, regular rhythm, normal S1, S2, no murmurs, rubs, clicks or gallops Abdomen - Normal bowel sounds, soft, firmness of rectangular mesh felt  Periumbilical Musculoskeletal -LT shoulder: no point tenderness, good ROM. Drop arm test is negative Extremities - no LE edema  Depression screen [facility] 2/9 10/10/2016 03/19/2016 10/26/2012 10/06/2012  Decreased Interest 1 0 0 0  Down, Depressed, Hopeless 0 0 0 0  PHQ - 2 Score 1 0 0 0   GAD 7 : Generalized Anxiety Score 10/10/2016  Nervous, Anxious, on Edge 0  Control/stop worrying 0  Worry too much - different things 0  Trouble relaxing 0  Restless (No Data)  Easily annoyed or irritable (No Data)  Afraid - awful might happen (No Data)    Results for orders placed or performed in visit on 10/10/16  POCT glycosylated hemoglobin (Hb A1C)  Result Value Ref Range   Hemoglobin A1C 6.1      ASSESSMENT AND PLAN: 1. Weight loss, unintentional -pt has developed fear of eating out of concern that surgical mesh may fail.  Pt advised not to be concern about that.  Would recommend avoiding heavy lifting. -Ensure or Glucerna shakes to supplement meals -check baseline labs including TSH - CBC - Comprehensive metabolic panel - TSH - HIV antibody  2. Diabetes mellitus screening - in range for pre-DM -discussed importance of healthy eating and regular exercise.  Pt to cut back on sweet drinks - POCT glycosylated hemoglobin (Hb A1C)  3. Colon cancer screening - Ambulatory referral to Gastroenterology  4. Essential hypertension At goal - Lipid panel  5. Acute pain of left shoulder  x-ray to rule out any abnormality in the bone. Exam manuevers to exclude rotator cuff pathology is negative - DG Shoulder Left; Future  6. Tobacco dependence Patient advised to quit smoking. Discussed health risks associated with smoking including lung and other  types of cancers, chronic lung diseases and CV risks.. Pt ready to give trail of quitting.  Discussed methods to help quit including quitting cold [place], use of NRT, Chantix and Bupropion. Patient would like to try NRT. He plans to purchase himself. I have also informed him of the one 800 quit now -5 mins spent on counseling.  7. Need for hepatitis C screening test - Hepatitis c antibody (reflex)  8. Prostate cancer screening - PSA   Patient was given the opportunity to ask questions.  Patient verbalized understanding of the plan and was able to repeat key elements of the plan.   Orders Placed This Encounter  Procedures  . DG Shoulder Left  .  CBC  . Comprehensive metabolic panel  . Lipid panel  . PSA  . TSH  . HIV antibody  . Hepatitis c antibody (reflex)  . Ambulatory referral to Gastroenterology  . POCT glycosylated hemoglobin (Hb A1C)     Requested Prescriptions    No prescriptions requested or ordered in this encounter    Return in about 6 weeks (around 11/21/2016).  Jonah Blueeborah Daphene Chisholm, MD, FACP

## 2016-10-10 NOTE — Progress Notes (Signed)
Pt states he gets hot and cold flashes Pt states he is not eating one min he can want or eat something but the min he bites it he doesn't want anymore Pt states he wants to gain weight

## 2016-10-11 ENCOUNTER — Telehealth: Payer: Self-pay | Admitting: Internal Medicine

## 2016-10-11 DIAGNOSIS — R945 Abnormal results of liver function studies: Secondary | ICD-10-CM

## 2016-10-11 DIAGNOSIS — R7989 Other specified abnormal findings of blood chemistry: Secondary | ICD-10-CM

## 2016-10-11 DIAGNOSIS — R748 Abnormal levels of other serum enzymes: Secondary | ICD-10-CM

## 2016-10-11 LAB — HCV COMMENT:

## 2016-10-11 LAB — CBC
HEMOGLOBIN: 14.6 g/dL (ref 13.0–17.7)
Hematocrit: 42.1 % (ref 37.5–51.0)
MCH: 30.4 pg (ref 26.6–33.0)
MCHC: 34.7 g/dL (ref 31.5–35.7)
MCV: 88 fL (ref 79–97)
Platelets: 268 10*3/uL (ref 150–379)
RBC: 4.8 x10E6/uL (ref 4.14–5.80)
RDW: 13.3 % (ref 12.3–15.4)
WBC: 4.1 10*3/uL (ref 3.4–10.8)

## 2016-10-11 LAB — COMPREHENSIVE METABOLIC PANEL
A/G RATIO: 1.1 — AB (ref 1.2–2.2)
ALBUMIN: 3.9 g/dL (ref 3.5–5.5)
ALT: 112 IU/L — AB (ref 0–44)
AST: 51 IU/L — ABNORMAL HIGH (ref 0–40)
Alkaline Phosphatase: 649 IU/L — ABNORMAL HIGH (ref 39–117)
BUN / CREAT RATIO: 11 (ref 9–20)
BUN: 9 mg/dL (ref 6–24)
Bilirubin Total: 1 mg/dL (ref 0.0–1.2)
CALCIUM: 9.4 mg/dL (ref 8.7–10.2)
CO2: 22 mmol/L (ref 20–29)
Chloride: 98 mmol/L (ref 96–106)
Creatinine, Ser: 0.85 mg/dL (ref 0.76–1.27)
GFR, EST AFRICAN AMERICAN: 111 mL/min/{1.73_m2} (ref >59)
GFR, EST NON AFRICAN AMERICAN: 96 mL/min/{1.73_m2} (ref >59)
Globulin, Total: 3.5 g/dL (ref 1.5–4.5)
Glucose: 142 mg/dL — ABNORMAL HIGH (ref 65–99)
Potassium: 3.4 mmol/L — ABNORMAL LOW (ref 3.5–5.2)
Sodium: 137 mmol/L (ref 134–144)
TOTAL PROTEIN: 7.4 g/dL (ref 6.0–8.5)

## 2016-10-11 LAB — HIV ANTIBODY (ROUTINE TESTING W REFLEX): HIV Screen 4th Generation wRfx: NONREACTIVE

## 2016-10-11 LAB — HEPATITIS C ANTIBODY (REFLEX): HCV Ab: <0.1 s/co ratio (ref 0.0–0.9)

## 2016-10-11 LAB — TSH: TSH: 1.26 u[IU]/mL (ref 0.450–4.500)

## 2016-10-11 LAB — LIPID PANEL
CHOL/HDL RATIO: 4.8 ratio (ref 0.0–5.0)
Cholesterol, Total: 169 mg/dL (ref 100–199)
HDL: 35 mg/dL — ABNORMAL LOW (ref >39)
LDL Calculated: 109 mg/dL — ABNORMAL HIGH (ref 0–99)
Triglycerides: 124 mg/dL (ref 0–149)
VLDL CHOLESTEROL CAL: 25 mg/dL (ref 5–40)

## 2016-10-11 LAB — PSA: PROSTATE SPECIFIC AG, SERUM: 1 ng/mL (ref 0.0–4.0)

## 2016-10-11 MED ORDER — POTASSIUM CHLORIDE ER 8 MEQ PO TBCR: 16.0000 meq | EXTENDED_RELEASE_TABLET | Freq: Every day | ORAL | 2 refills | Status: DC

## 2016-10-11 NOTE — Telephone Encounter (Signed)
PC placed to pt to go over lab results.  Pt informed of low K+ likely due to HCTZ.  I recommend adding low dose of K+ supplement to take with HCTZ.  Rxn sent to his pharmacy. Pt also informed of abnormal LFTs (AST/ALT and significant elevation in AlkPhos).  I asked pt if he drinks ETOH. On visit yesterday he said occasionally but today admits that he drinks 3-4 cans of beer a day. Advised pt to cut back. He will try to limit to 1 can a day. Hep C and HIV screens were negative. TSH normal. Request that he return to lab for additional blood test namely GGTP and Hep B serology.  I will also order US liver/GB given the degree in elevation of AlkPhos.

## 2016-10-13 ENCOUNTER — Other Ambulatory Visit: Payer: Self-pay | Admitting: Internal Medicine

## 2016-10-13 DIAGNOSIS — I10 Essential (primary) hypertension: Secondary | ICD-10-CM

## 2016-10-13 NOTE — Telephone Encounter (Signed)
[facility] 2016 @ 730 arrive 715 NPO after midnight. Contacted pt to make aware of the appointment pt is aware and doesn't have any questions or concerns

## 2016-10-14 ENCOUNTER — Other Ambulatory Visit: Payer: Self-pay | Admitting: Internal Medicine

## 2016-10-14 DIAGNOSIS — I10 Essential (primary) hypertension: Secondary | ICD-10-CM

## 2016-10-14 NOTE — Telephone Encounter (Signed)
[facility], he was seen there on 10/10/16

## 2016-10-17 ENCOUNTER — Other Ambulatory Visit: Payer: Self-pay | Admitting: Internal Medicine

## 2016-10-17 ENCOUNTER — Ambulatory Visit (HOSPITAL_COMMUNITY)
Admission: RE | Admit: 2016-10-17 | Discharge: 2016-10-17 | Disposition: A | Discharge status: Home / Self Care | Payer: Medicaid Other | Source: Ambulatory Visit | Attending: Internal Medicine | Admitting: Internal Medicine

## 2016-10-17 DIAGNOSIS — R945 Abnormal results of liver function studies: Secondary | ICD-10-CM | POA: Insufficient documentation

## 2016-10-17 DIAGNOSIS — M25512 Pain in left shoulder: Secondary | ICD-10-CM | POA: Diagnosis present

## 2016-10-17 DIAGNOSIS — R7989 Other specified abnormal findings of blood chemistry: Secondary | ICD-10-CM

## 2016-10-17 DIAGNOSIS — R748 Abnormal levels of other serum enzymes: Secondary | ICD-10-CM | POA: Insufficient documentation

## 2016-10-22 ENCOUNTER — Telehealth: Payer: Self-pay

## 2016-10-22 NOTE — Telephone Encounter (Signed)
Contacted pt to go over xray and ultrasound results pt is aware and doesn't have any questions or concerns

## 2016-11-03 ENCOUNTER — Ambulatory Visit: Payer: Medicaid Other | Attending: Internal Medicine

## 2016-11-03 DIAGNOSIS — R7989 Other specified abnormal findings of blood chemistry: Secondary | ICD-10-CM

## 2016-11-03 DIAGNOSIS — R945 Abnormal results of liver function studies: Secondary | ICD-10-CM

## 2016-11-04 LAB — GAMMA GT: GGT: 268 IU/L — ABNORMAL HIGH (ref 0–65)

## 2016-11-04 LAB — HEPATITIS B CORE ANTIBODY, TOTAL: HEP B C TOTAL AB: NEGATIVE

## 2016-11-04 LAB — MITOCHONDRIAL ANTIBODIES: MITOCHONDRIAL AB: 52.2 U — AB (ref 0.0–20.0)

## 2016-11-04 LAB — HEPATITIS B SURFACE ANTIGEN: HEP B S AG: NEGATIVE

## 2016-11-05 ENCOUNTER — Telehealth: Payer: Self-pay | Admitting: Internal Medicine

## 2016-11-05 DIAGNOSIS — R748 Abnormal levels of other serum enzymes: Secondary | ICD-10-CM

## 2016-11-05 NOTE — Telephone Encounter (Signed)
PC placed to pt today. History: pt  Seen 10/10/2016 with 10 lb wgh loss post umbilical hernia repain.  Noted to have elevated Alk. Phos in the 600 on lab. GGTP elevated and AMA +. RUQ U/S showed some sludge in GB otherwise negative. Hep B screen negative. I recommend refer to GI for further evaluation. Pt agreeable to referral.

## 2016-11-18 ENCOUNTER — Other Ambulatory Visit: Payer: Self-pay | Admitting: Pharmacist

## 2016-11-18 DIAGNOSIS — I10 Essential (primary) hypertension: Secondary | ICD-10-CM

## 2016-11-18 MED ORDER — HYDROCHLOROTHIAZIDE 12.5 MG PO CAPS: 12.5000 mg | ORAL_CAPSULE | Freq: Every day | ORAL | 0 refills | Status: DC

## 2017-01-16 ENCOUNTER — Other Ambulatory Visit: Payer: Self-pay | Admitting: Gastroenterology

## 2017-01-16 DIAGNOSIS — R945 Abnormal results of liver function studies: Secondary | ICD-10-CM

## 2017-01-16 DIAGNOSIS — R7989 Other specified abnormal findings of blood chemistry: Secondary | ICD-10-CM

## 2017-01-27 ENCOUNTER — Encounter: Payer: Self-pay | Admitting: Internal Medicine

## 2017-01-27 ENCOUNTER — Ambulatory Visit: Payer: Medicaid Other | Attending: Internal Medicine | Admitting: Internal Medicine

## 2017-01-27 VITALS — BP 157/101 | HR 73 | Temp 98.6°F | Resp 16 | Wt 172.0 lb

## 2017-01-27 DIAGNOSIS — I10 Essential (primary) hypertension: Secondary | ICD-10-CM

## 2017-01-27 DIAGNOSIS — R200 Anesthesia of skin: Secondary | ICD-10-CM

## 2017-01-27 DIAGNOSIS — M25512 Pain in left shoulder: Secondary | ICD-10-CM | POA: Diagnosis not present

## 2017-01-27 DIAGNOSIS — F17200 Nicotine dependence, unspecified, uncomplicated: Secondary | ICD-10-CM | POA: Insufficient documentation

## 2017-01-27 DIAGNOSIS — Z800 Family history of malignant neoplasm of digestive organs: Secondary | ICD-10-CM | POA: Diagnosis not present

## 2017-01-27 DIAGNOSIS — R7303 Prediabetes: Secondary | ICD-10-CM | POA: Diagnosis not present

## 2017-01-27 DIAGNOSIS — Z2821 Immunization not carried out because of patient refusal: Secondary | ICD-10-CM | POA: Diagnosis not present

## 2017-01-27 DIAGNOSIS — G8929 Other chronic pain: Secondary | ICD-10-CM | POA: Insufficient documentation

## 2017-01-27 DIAGNOSIS — K860 Alcohol-induced chronic pancreatitis: Secondary | ICD-10-CM | POA: Insufficient documentation

## 2017-01-27 MED ORDER — DICLOFENAC SODIUM 1 % TD GEL: 2.0000 g | Freq: Four times a day (QID) | TRANSDERMAL | 1 refills | Status: DC

## 2017-01-27 MED ORDER — AMLODIPINE BESYLATE 5 MG PO TABS: 5.0000 mg | ORAL_TABLET | Freq: Every day | ORAL | 3 refills | Status: DC

## 2017-01-27 NOTE — Progress Notes (Signed)
Patient ID: Marco Pittman, male    DOB: Nov 04, 1960  MRN: [id number]  CC: Hypertension; Medication Refill; and Numbness (arm)   Subjective: Marco Pittman is a 56 y.o. male who presents for chronic disease management. Last seen in July.  His concerns today include:  Hx of HTN, chronic pancreatitis due to ETOH, tob use, pre-DM.  1. Fhx of colon CA and elv Alk.phos in 600s: had colonoscopy by Dr. Alessandra Bevels of [facility] in August 2018. I do not have the report as yet. Had polyp removed, told he may need operation. -MRI ordered  2. HTN: out of HCTZ x 2 wks.  States his pharmacy tried to contact us.  3. LT shoulder pain: still has pain posterior LT shoulder. He points to upper trapezius muscle   -+ numbness in arm x 2 mths.No numbness in hand.  Numbness goes away when he takes his BP med No weakness Nothing makes it worse. Goes away with elevation of arm to about 90 degrees. -no  CP or neck pain.  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 Down to 5 cigarettes a day. Never got nicotine patches but plans to purchase  5. Pre-DM:  Last A1c was 6.1 .He has cut back on white carbs. He drinks a lot of sweet juices Patient Active Problem List   Diagnosis Date Noted  . Diabetes mellitus screening 10/10/2016  . Weight loss, unintentional 10/10/2016  . Tobacco abuse 03/19/2016  . History of alcohol abuse 03/19/2016  . Chronic pancreatitis ([place]) 03/04/2016  . Essential hypertension, benign 10/06/2012     Current Outpatient Prescriptions on File Prior to Visit  Medication Sig Dispense Refill  . feeding supplement (BOOST / RESOURCE BREEZE) LIQD Take 1 Container by mouth 3 (three) times daily between meals. (Patient not taking: Reported on 10/10/2016) 237 mL 3   No current facility-administered medications on file prior to visit.     No Known Allergies  Social History   Social History  . Marital status: Single    Spouse name: N/A  . Number of children: N/A  .  Years of education: N/A   Occupational History  . Not on file.   Social History Main Topics  . Smoking status: Current Every Day Smoker    Packs/day: 0.50    Years: 45.00    Types: Cigars  . Smokeless tobacco: Never Used  . Alcohol use 8.4 oz/week    14 Cans of beer per week     Comment: last alcohol on 03/29/16  . Drug use: No  . Sexual activity: Yes   Other Topics Concern  . Not on file   Social History Narrative  . No narrative on file    Family History  Problem Relation Age of Onset  . Cancer Father 58       colon    Past Surgical History:  Procedure Laterality Date  . INSERTION OF MESH N/A 04/14/2016   Procedure: INSERTION OF MESH;  Surgeon: Jackolyn Confer, MD;  Location: [place];  Service: General;  Laterality: N/A;  . NO PAST SURGERIES    . UMBILICAL HERNIA REPAIR N/A 04/14/2016   Procedure: UMBILICAL HERNIA REPAIR WITH MESH;  Surgeon: Jackolyn Confer, MD;  Location: [place];  Service: General;  Laterality: N/A;    ROS: Review of Systems  PHYSICAL EXAM: BP (!) 157/101   Pulse 73   Temp 98.6 F (37 C) (Oral)   Resp 16   Wt  172 lb (78 kg)   SpO2 98%   BMI 25.40 kg/m   Wt Readings from Last 3 Encounters:  01/27/17 172 lb (78 kg)  10/10/16 161 lb 9.6 oz (73.3 kg)  04/14/16 171 lb (77.6 kg)    Physical Exam  General appearance - alert, well appearing, and in no distress Mental status - alert, oriented to person, place, and time, normal mood, behavior, speech, dress, motor activity, and thought processes Neck - supple, no significant adenopathy Chest - clear to auscultation, no wheezes, rales or rhonchi, symmetric air entry Heart - normal rate, regular rhythm, normal S1, S2, no murmurs, rubs, clicks or gallops Musculoskeletal - left shoulder: No point tenderness. Good range of motion. Subjective decreased sensation to gross touch left upper arm to mid forearm Extremities - no lower extremity edema   Results for  orders placed or performed in visit on 11/03/16  Gamma GT  Result Value Ref Range   GGT 268 (H) 0 - 65 IU/L  Hepatitis B surface antigen  Result Value Ref Range   Hepatitis B Surface Ag Negative Negative  Hepatitis B core antibody, total  Result Value Ref Range   Hep B Core Total Ab Negative Negative  Mitochondrial Antibodies  Result Value Ref Range   Mitochondrial Ab 52.2 (H) 0.0 - 20.0 Units   A1C today 6.1  ASSESSMENT AND PLAN: 1. Chronic left shoulder pain -X-ray done on last visit was negative. Exam at this time is unrevealing. Pain seemed to be more in the trapezius muscle -Will try him with home care in general - diclofenac sodium (VOLTAREN) 1 % GEL; Apply 2 g topically 4 (four) times daily.  Dispense: 100 g; Refill: 1  2. Arm numbness left -Will check x-ray of the C-spine. If negative will refer for EMG - DG Cervical Spine Complete; Future  3. Essential hypertension -Change HCTZ to amlodipine. This way he would not have to pay for HCTZ and potassium which he states he gets a little costly - amLODipine (NORVASC) 5 MG tablet; Take 1 tablet (5 mg total) by mouth daily.  Dispense: 90 tablet; Refill: 3  4. Tobacco dependence -Advised to quit. Patient plans to purchase nicotine patches over-the-counter  5. Pre-diabetes -Discussed importance of healthy eating habits and regular exercise. Patient advised to discontinue drinking sweet juices  6. Influenza vaccination declined  I will await colonoscopy report and further information about workup for the elevated alkaline phosphatase from [facility].   Patient was given the opportunity to ask questions.  Patient verbalized understanding of the plan and was able to repeat key elements of the plan.   Orders Placed This Encounter  Procedures  . DG Cervical Spine Complete     Requested Prescriptions   Signed Prescriptions Disp Refills  . amLODipine (NORVASC) 5 MG tablet 90 tablet 3    Sig: Take 1 tablet (5 mg total) by  mouth daily.  . diclofenac sodium (VOLTAREN) 1 % GEL 100 g 1    Sig: Apply 2 g topically 4 (four) times daily.    Return in about 3 months (around 04/29/2017).  Karle Plumber, MD, FACP

## 2017-01-27 NOTE — Progress Notes (Signed)
Pt states his arm left arm is numb and has always been numb for 4-5 months ago Pt states he has xrays on it

## 2017-01-27 NOTE — Patient Instructions (Signed)
Your blood pressure medicine has been changed from HCTZ to amlodipine 5 mg daily. I have sent a prescription for pain rub for you to use on your left upper arm. Remember to go to the hospital to have the x-ray done on your neck. If this looks okay we will refer you to a neurologist for nerve conduction study on your left arm..Marland Kitchen

## 2017-01-29 ENCOUNTER — Encounter: Payer: Self-pay | Admitting: Internal Medicine

## 2017-01-29 DIAGNOSIS — K743 Primary biliary cirrhosis: Secondary | ICD-10-CM

## 2017-01-29 DIAGNOSIS — K635 Polyp of colon: Secondary | ICD-10-CM

## 2017-01-29 NOTE — Progress Notes (Signed)
[facility] regarding [facility].  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  He also had diverticulosis, hemorrhoids, and  hypertrophied  anal papilla.  He will need repeat colonoscopy in 1-2 years. 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  He was started on ursodiol. He will need vaccines for hepatitis a and B on follow-up visit.

## 2017-02-04 ENCOUNTER — Ambulatory Visit
Admission: RE | Admit: 2017-02-04 | Discharge: 2017-02-04 | Disposition: A | Discharge status: Home / Self Care | Payer: Medicaid Other | Source: Ambulatory Visit | Attending: Gastroenterology | Admitting: Gastroenterology

## 2017-02-04 DIAGNOSIS — R7989 Other specified abnormal findings of blood chemistry: Secondary | ICD-10-CM

## 2017-02-04 DIAGNOSIS — R945 Abnormal results of liver function studies: Secondary | ICD-10-CM

## 2017-02-04 MED ORDER — GADOBENATE DIMEGLUMINE 529 MG/ML IV SOLN
16.0000 mL | Freq: Once | INTRAVENOUS | Status: AC | PRN
  Administered 2017-02-04: 16 mL via INTRAVENOUS

## 2017-05-13 ENCOUNTER — Other Ambulatory Visit: Payer: Self-pay | Admitting: Gastroenterology

## 2017-05-13 DIAGNOSIS — K831 Obstruction of bile duct: Secondary | ICD-10-CM

## 2017-06-16 ENCOUNTER — Ambulatory Visit: Payer: Medicaid Other | Attending: Internal Medicine | Admitting: Internal Medicine

## 2017-06-16 ENCOUNTER — Encounter: Payer: Self-pay | Admitting: Internal Medicine

## 2017-06-16 VITALS — BP 148/89 | HR 73 | Temp 98.8°F | Resp 16 | Wt 189.2 lb

## 2017-06-16 DIAGNOSIS — Z6827 Body mass index (BMI) 27.0-27.9, adult: Secondary | ICD-10-CM | POA: Insufficient documentation

## 2017-06-16 DIAGNOSIS — Z87891 Personal history of nicotine dependence: Secondary | ICD-10-CM | POA: Diagnosis not present

## 2017-06-16 DIAGNOSIS — K649 Unspecified hemorrhoids: Secondary | ICD-10-CM | POA: Insufficient documentation

## 2017-06-16 DIAGNOSIS — R7303 Prediabetes: Secondary | ICD-10-CM | POA: Insufficient documentation

## 2017-06-16 DIAGNOSIS — F17210 Nicotine dependence, cigarettes, uncomplicated: Secondary | ICD-10-CM | POA: Insufficient documentation

## 2017-06-16 DIAGNOSIS — I10 Essential (primary) hypertension: Secondary | ICD-10-CM | POA: Diagnosis not present

## 2017-06-16 DIAGNOSIS — K860 Alcohol-induced chronic pancreatitis: Secondary | ICD-10-CM | POA: Insufficient documentation

## 2017-06-16 DIAGNOSIS — K635 Polyp of colon: Secondary | ICD-10-CM | POA: Insufficient documentation

## 2017-06-16 DIAGNOSIS — K743 Primary biliary cirrhosis: Secondary | ICD-10-CM | POA: Insufficient documentation

## 2017-06-16 DIAGNOSIS — Z79899 Other long term (current) drug therapy: Secondary | ICD-10-CM | POA: Insufficient documentation

## 2017-06-16 DIAGNOSIS — E663 Overweight: Secondary | ICD-10-CM | POA: Diagnosis not present

## 2017-06-16 DIAGNOSIS — K5790 Diverticulosis of intestine, part unspecified, without perforation or abscess without bleeding: Secondary | ICD-10-CM | POA: Diagnosis not present

## 2017-06-16 DIAGNOSIS — K6289 Other specified diseases of anus and rectum: Secondary | ICD-10-CM | POA: Insufficient documentation

## 2017-06-16 MED ORDER — AMLODIPINE BESYLATE 10 MG PO TABS: 10.0000 mg | ORAL_TABLET | Freq: Every day | ORAL | 3 refills | Status: DC

## 2017-06-16 NOTE — Progress Notes (Signed)
Patient ID: Marco SavoyMichael W Pittman, male    DOB: 01/21/1961  MRN: [id number]  CC: Hypertension   Subjective: Marco CapMichael Pittman is a 57 y.o. 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   1.  Since last visit I [facility].  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  He also had diverticulosis, hemorrhoids, and  hypertrophied  anal papilla.  He will need repeat colonoscopy in 1-2 years.  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  Patient started on ursodiol.  He has this medicine with him today and reports that he is tolerating it okay.  Has follow-up appointment with the gastroenterologist on the second of next month.  2.  HTN:  Compliant with Norvasc.  Denies any chest pain, shortness of breath, lower extremity edema, headaches or dizziness Limits salt in foods.  3.  Gained about 15-17 lbs since last visit.  Admits that he has been eating more.  He is now working 2 jobs as a custodian so he gets in his exercise that weight.  4.  Has cut back on ETOH.  He has discontinued smoking. Overall he reports that he is feeling much better. Patient Active Problem List   Diagnosis Date Noted  . Former smoker 06/16/2017  . 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 Sessile colonic polyp 01/29/2017  . Pre-diabetes 01/27/2017  . Chronic left shoulder pain 01/27/2017  . Weight loss, unintentional 10/10/2016  . History of alcohol abuse 03/19/2016  . Chronic pancreatitis (H[facility]) 03/04/2016  . Essential hypertension, benign 10/06/2012     Current Outpatient Medications on File Prior to Visit  Medication Sig Dispense Refill  . ursodiol (ACTIGALL) 500 MG tablet Take 500 mg by mouth 2 (two) times daily.    . diclofenac sodium (VOLTAREN) 1 % GEL  Apply 2 g topically 4 (four) times daily. 100 g 1  . feeding supplement (BOOST / RESOURCE BREEZE) LIQD Take 1 Container by mouth 3 (three) times daily between meals. (Patient not taking: Reported on 10/10/2016) 237 mL 3   No current facility-administered medications on file prior to visit.     No Known Allergies  Social History   Socioeconomic History  . Marital status: Single    Spouse name: Not on file  . Number of children: Not on file  . Years of education: Not on file  . Highest education level: Not on file  Social Needs  . Financial resource strain: Not on file  . Food insecurity - worry: Not on file  . Food insecurity - inability: Not on file  . Transportation needs - medical: Not on file  . Transportation needs - non-medical: Not on file  Occupational History  . Not on file  Tobacco Use  . Smoking status: Current Every Day Smoker    Packs/day: 0.50    Years: 45.00    Pack years: 22.50    Types: Cigars  . Smokeless tobacco: Never Used  Substance and Sexual Activity  . Alcohol use: Yes    Alcohol/week: 8.4 oz    Types: 14 Cans of beer per week    Comment: last alcohol on 03/29/16  . Drug use: No  . Sexual activity: Yes  Other Topics Concern  . Not on file  Social History Narrative  .  Not on file    Family History  Problem Relation Age of Onset  . Cancer Father 89       colon    Past Surgical History:  Procedure Laterality Date  . INSERTION OF MESH N/A 04/14/2016   Procedure: INSERTION OF MESH;  Surgeon: Avel Peace, MD;  Location: [facility];  Service: General;  Laterality: N/A;  . NO PAST SURGERIES    . UMBILICAL HERNIA REPAIR N/A 04/14/2016   Procedure: UMBILICAL HERNIA REPAIR WITH MESH;  Surgeon: Avel Peace, MD;  Location: [facility];  Service: General;  Laterality: N/A;    ROS: Review of Systems  Constitutional: Positive for appetite change (increase appetite).  Respiratory: Negative for cough and shortness of  breath.   Cardiovascular: Negative for chest pain and leg swelling.    PHYSICAL EXAM: BP (!) 148/89   Pulse 73   Temp 98.8 F (37.1 C) (Oral)   Resp 16   Wt 189 lb 3.2 oz (85.8 kg)   SpO2 97%   BMI 27.94 kg/m   Wt Readings from Last 3 Encounters:  06/16/17 189 lb 3.2 oz (85.8 kg)  01/27/17 172 lb (78 kg)  10/10/16 161 lb 9.6 oz (73.3 kg)  Repeat BP 146/90  Physical Exam  General appearance - alert, well appearing, and in no distress Mental status - alert, oriented to person, place, and time, normal mood, behavior, speech, dress, motor activity, and thought processes Chest - clear to auscultation, no wheezes, rales or rhonchi, symmetric air entry Heart - normal rate, regular rhythm, normal S1, S2, no murmurs, rubs, clicks or gallops Extremities - peripheral pulses normal, no pedal edema, no clubbing or cyanosis   ASSESSMENT AND PLAN: 1. Essential hypertension Not at goal. Increase Norvasc to 10 mg daily. - amLODipine (NORVASC) 10 MG tablet; Take 1 tablet (10 mg total) by mouth daily.  Dispense: 90 tablet; Refill: 3  2. Polyp of colon, unspecified part of colon, unspecified type Health maintenance updated.  He will need repeat colonoscopy in 1-2 years.  3. Primary biliary cirrhosis (HCC) Followed by [facility].  4. Former smoker Commended him on quitting.  5.  Overweight Given his history of prediabetes and recent weight gain, I have done some dietary counseling. Patient was given the opportunity to ask questions.  Patient verbalized understanding of the plan and was able to repeat key elements of the plan.   No orders of the defined types were placed in this encounter.    Requested Prescriptions   Signed Prescriptions Disp Refills  . amLODipine (NORVASC) 10 MG tablet 90 tablet 3    Sig: Take 1 tablet (10 mg total) by mouth daily.    Return in about 4 months (around 10/16/2017).  Jonah Blue, MD, FACP

## 2017-06-16 NOTE — Patient Instructions (Signed)
Your blood pressure is not controled.  Increase Norvasc to 10 mg daily.

## 2017-06-30 DIAGNOSIS — I10 Essential (primary) hypertension: Secondary | ICD-10-CM

## 2017-07-07 DIAGNOSIS — R935 Abnormal findings on diagnostic imaging of other abdominal regions, including retroperitoneum: Secondary | ICD-10-CM | POA: Diagnosis not present

## 2017-07-07 DIAGNOSIS — Z8719 Personal history of other diseases of the digestive system: Secondary | ICD-10-CM | POA: Diagnosis not present

## 2017-07-07 DIAGNOSIS — K743 Primary biliary cirrhosis: Secondary | ICD-10-CM | POA: Diagnosis not present

## 2017-07-07 DIAGNOSIS — R945 Abnormal results of liver function studies: Secondary | ICD-10-CM | POA: Diagnosis not present

## 2017-07-07 DIAGNOSIS — K831 Obstruction of bile duct: Secondary | ICD-10-CM | POA: Diagnosis not present

## 2017-07-07 DIAGNOSIS — D3610 Benign neoplasm of peripheral nerves and autonomic nervous system, unspecified: Secondary | ICD-10-CM | POA: Diagnosis not present

## 2017-07-07 DIAGNOSIS — Z23 Encounter for immunization: Secondary | ICD-10-CM | POA: Diagnosis not present

## 2017-07-07 DIAGNOSIS — Z800 Family history of malignant neoplasm of digestive organs: Secondary | ICD-10-CM | POA: Diagnosis not present

## 2017-07-09 IMAGING — US US ABDOMEN COMPLETE
1 series · 13 of 25 positions shown · non-contrast
Comparison: CT 03/04/2016

CLINICAL DATA: Pain, pancreatitis

EXAM:
ABDOMEN ULTRASOUND COMPLETE

[Series 1: us abdomen complete · 0.23mm/px · 13 of 101 slices shown]
[im 1/101]
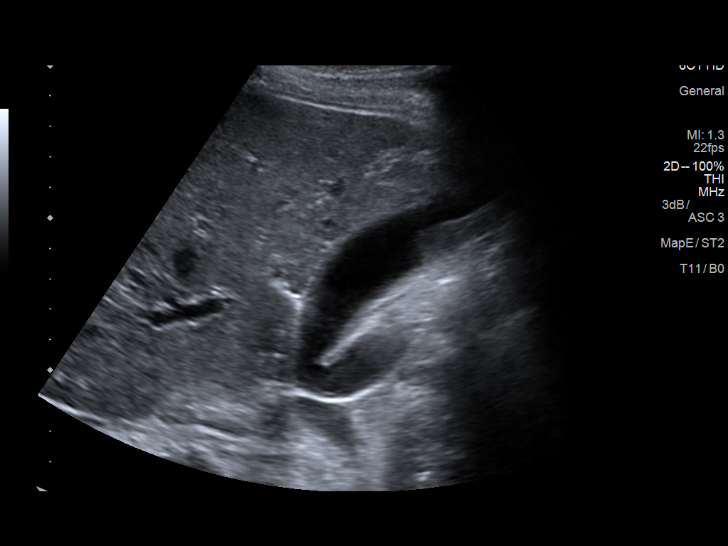
[im 9/101]
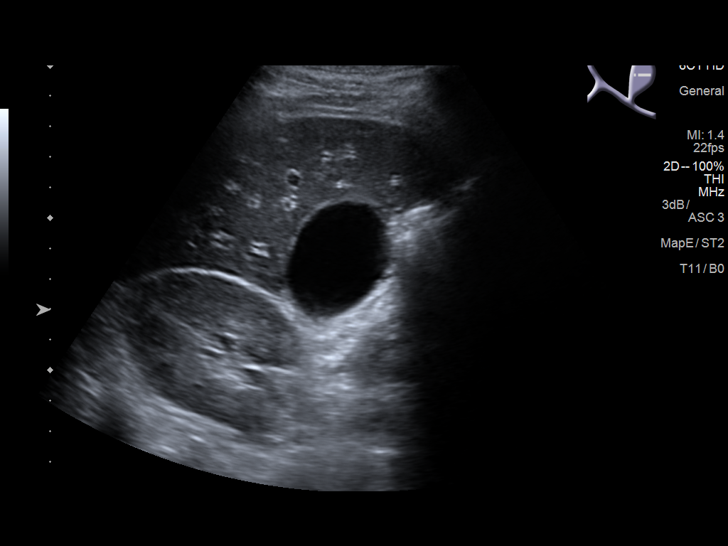
[im 17/101]
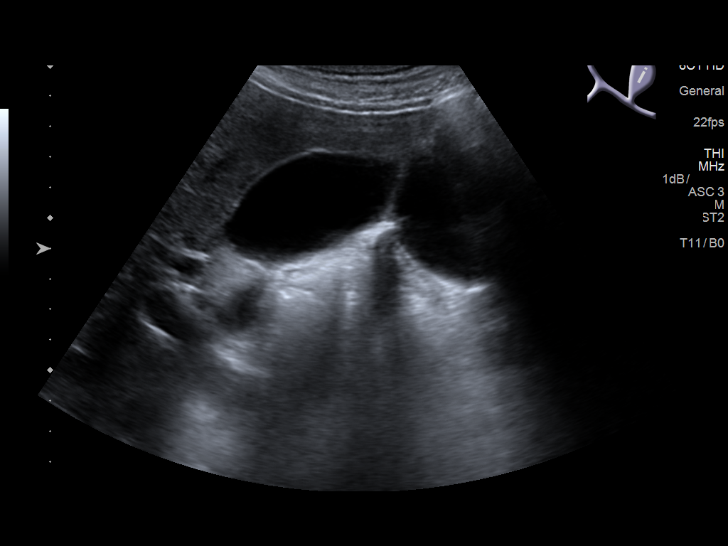
[im 26/101]
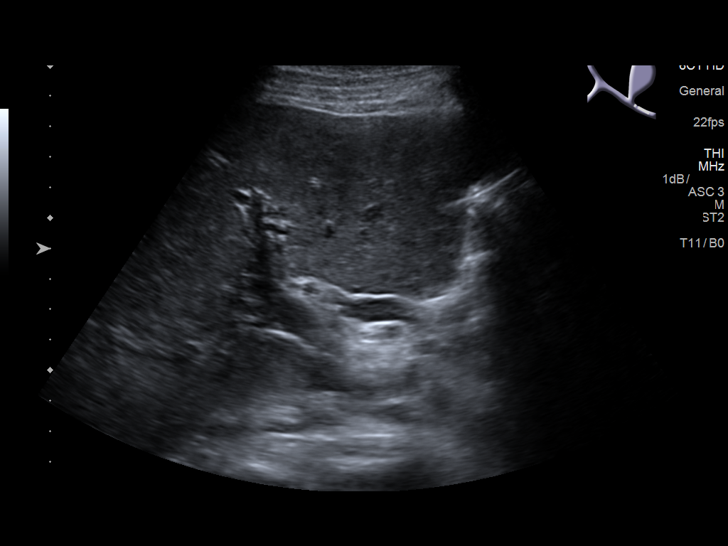
[im 34/101]
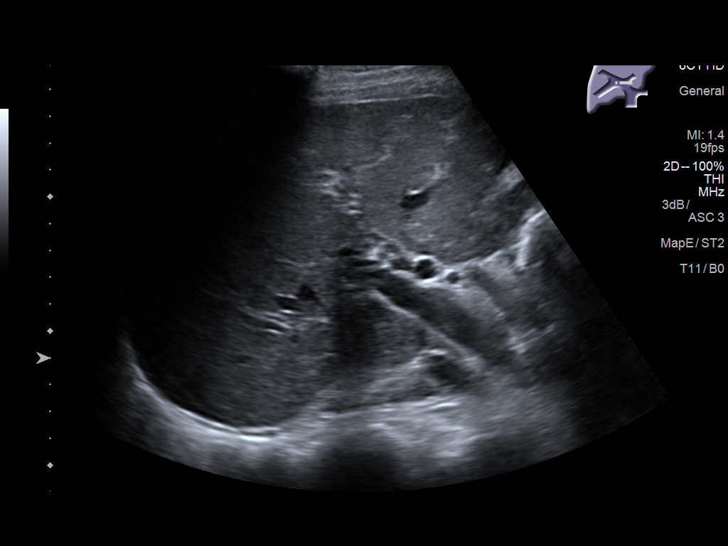
[im 42/101]
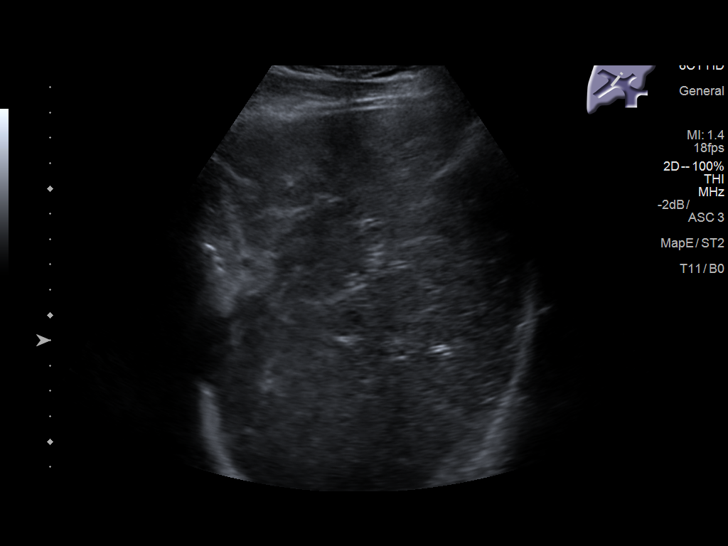
[im 51/101]
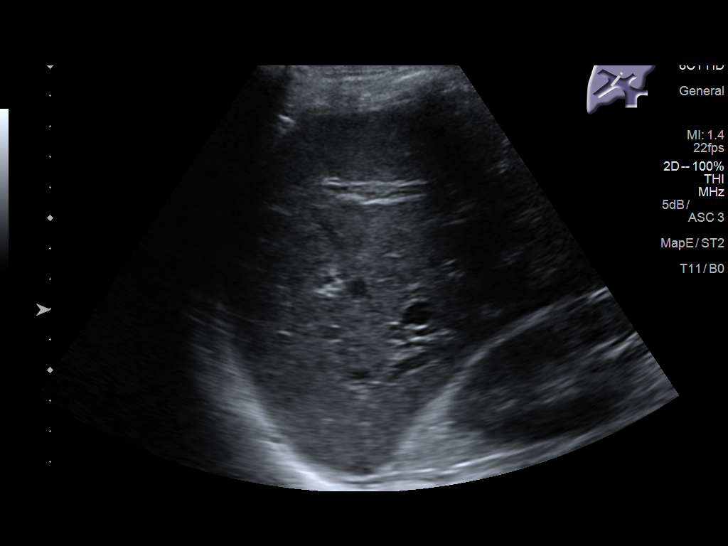
[im 59/101]
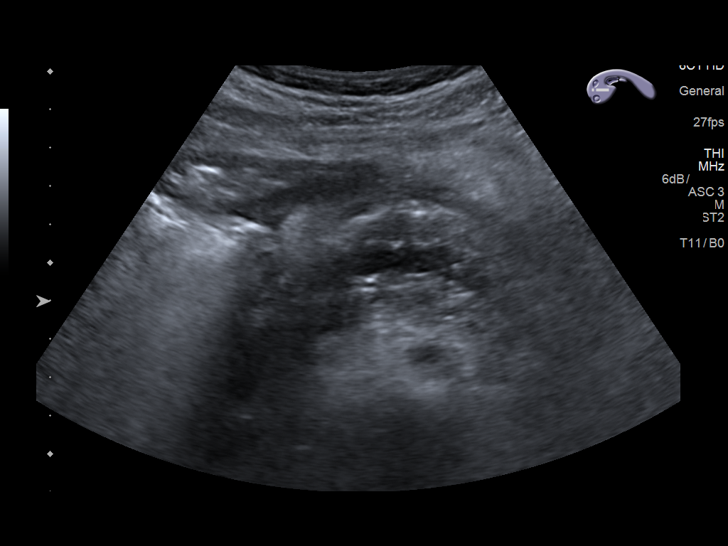
[im 67/101]
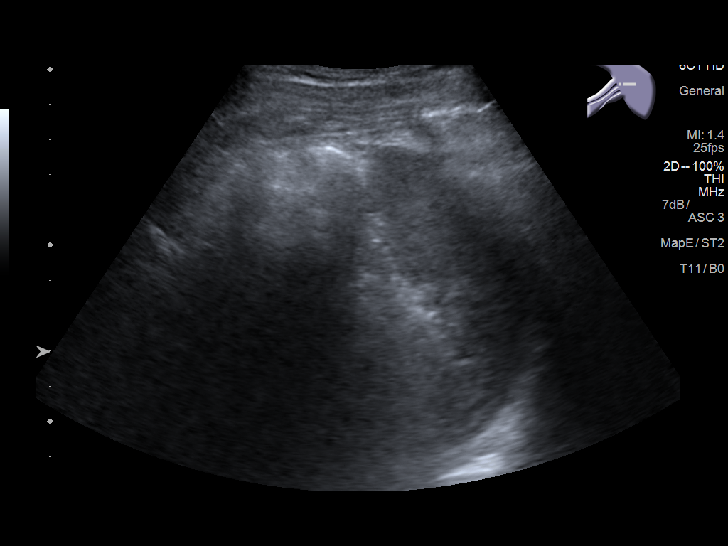
[im 76/101]
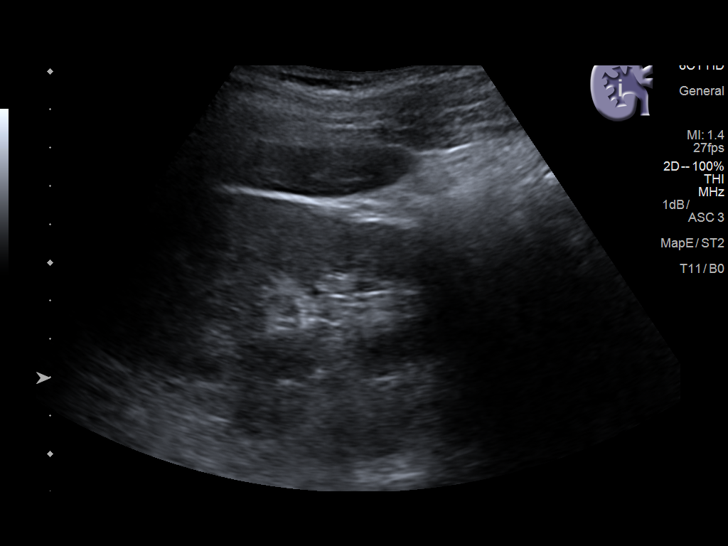
[im 84/101]
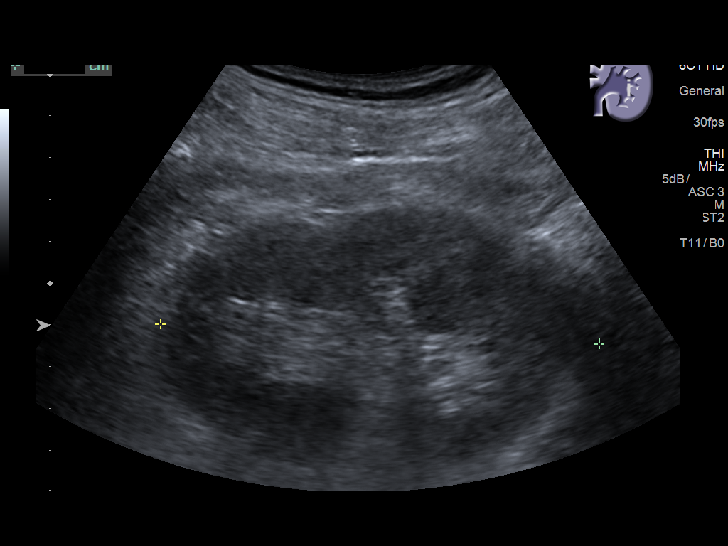
[im 92/101]
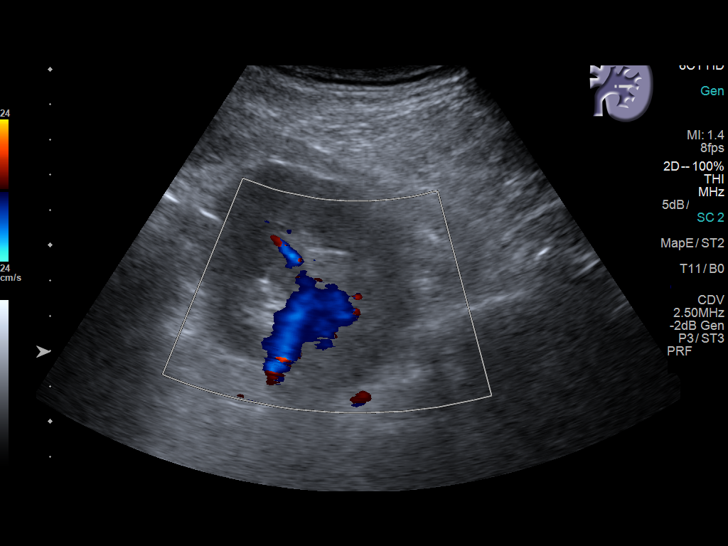
[im 101/101]
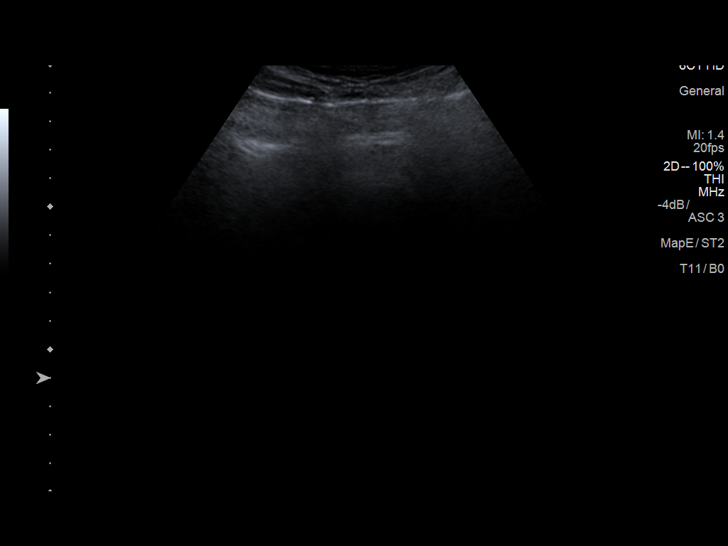

[13 of 25 positions shown; findings below may reference images not displayed]

FINDINGS: Gallbladder: No gallstones or wall thickening visualized. No
sonographic Murphy sign noted by sonographer.

Common bile duct: Diameter: 7 mm

Liver: No focal hepatic abnormality. Echogenicity within normal
limits. There is intra hepatic biliary dilatation.

IVC: No abnormality visualized.

Pancreas: Pancreatic calcifications are present. The pancreatic duct
is dilated, measuring up to 6 mm in size. No gross peripancreatic
fluid collections.

Spleen: Size and appearance within normal limits.

Right Kidney: Length: 11.1 cm. Echogenicity within normal limits. No
mass or hydronephrosis visualized.

Left Kidney: Length: 10.5 cm. Echogenicity within normal limits. No
mass or hydronephrosis visualized.

Abdominal aorta: Ectatic but no aneurysm. Distal aorta and
bifurcation are not seen. Maximum AP diameter of 2.8 cm.

Other findings: None.
IMPRESSION: 1. No sonographic evidence for gallstones.
2. Intra hepatic and extrahepatic biliary dilatation.
3. Dilated pancreatic duct. Pancreatic calcifications corresponding
to CT findings, consistent with chronic pancreatitis. No gross focal
pancreatic fluid collections are visualized.

## 2017-07-29 ENCOUNTER — Telehealth: Payer: Self-pay | Admitting: Internal Medicine

## 2017-07-29 NOTE — Telephone Encounter (Signed)
[facility].  Patient seen July 07, 2017.  Physician there is Dr. Dulce Sellarutlaw.  Patient reportedly had MRI-MRCP February 04, 2017 which showed mild diffuse pancreatic ductal dilatation which could be related to chronic pancreatitis.  Also showed mild diffuse biliary ductal dilatation due to stricture involving the intrapancreatic portion of the bile duct which again could be related to chronic pancreatitis.  No need for EUS per Dr. Dulce Sellarutlaw.  He recommends repeat MRI/MRCP in 3 months.

## 2017-09-02 ENCOUNTER — Telehealth: Payer: Self-pay | Admitting: Internal Medicine

## 2017-09-02 NOTE — Telephone Encounter (Signed)
Called patient to set up a f/u appt with PCP. Patient did not have a VM set up.

## 2018-04-13 ENCOUNTER — Ambulatory Visit: Payer: Medicaid Other | Attending: Nurse Practitioner | Admitting: Nurse Practitioner

## 2018-04-13 ENCOUNTER — Encounter: Payer: Self-pay | Admitting: Nurse Practitioner

## 2018-04-13 VITALS — BP 150/104 | HR 83 | Temp 98.0°F | Ht 69.0 in | Wt 172.0 lb

## 2018-04-13 DIAGNOSIS — K743 Primary biliary cirrhosis: Secondary | ICD-10-CM | POA: Diagnosis not present

## 2018-04-13 DIAGNOSIS — K860 Alcohol-induced chronic pancreatitis: Secondary | ICD-10-CM | POA: Insufficient documentation

## 2018-04-13 DIAGNOSIS — Z91120 Patient's intentional underdosing of medication regimen due to financial hardship: Secondary | ICD-10-CM | POA: Diagnosis not present

## 2018-04-13 DIAGNOSIS — K861 Other chronic pancreatitis: Secondary | ICD-10-CM

## 2018-04-13 DIAGNOSIS — I10 Essential (primary) hypertension: Secondary | ICD-10-CM

## 2018-04-13 DIAGNOSIS — T461X6A Underdosing of calcium-channel blockers, initial encounter: Secondary | ICD-10-CM | POA: Insufficient documentation

## 2018-04-13 DIAGNOSIS — T50996A Underdosing of other drugs, medicaments and biological substances, initial encounter: Secondary | ICD-10-CM | POA: Diagnosis not present

## 2018-04-13 DIAGNOSIS — Z79899 Other long term (current) drug therapy: Secondary | ICD-10-CM | POA: Diagnosis not present

## 2018-04-13 DIAGNOSIS — Z9119 Patient's noncompliance with other medical treatment and regimen: Secondary | ICD-10-CM | POA: Insufficient documentation

## 2018-04-13 DIAGNOSIS — K745 Biliary cirrhosis, unspecified: Secondary | ICD-10-CM

## 2018-04-13 DIAGNOSIS — R232 Flushing: Secondary | ICD-10-CM | POA: Diagnosis present

## 2018-04-13 MED ORDER — AMLODIPINE BESYLATE 10 MG PO TABS: 10.0000 mg | ORAL_TABLET | Freq: Every day | ORAL | 3 refills | Status: DC

## 2018-04-13 MED ORDER — URSODIOL 500 MG PO TABS: 500.0000 mg | ORAL_TABLET | Freq: Two times a day (BID) | ORAL | 1 refills | Status: DC

## 2018-04-13 NOTE — Progress Notes (Signed)
Assessment & Plan:  Quinn was seen today for hot flashes and chills.  Diagnoses and all orders for this visit:  Essential hypertension -     amLODipine (NORVASC) 10 MG tablet; Take 1 tablet (10 mg total) by mouth daily.  Biliary cirrhosis ([place]) -     Ambulatory referral to Gastroenterology -     ursodiol (ACTIGALL) 500 MG tablet; Take 1 tablet (500 mg total) by mouth 2 (two) times daily. -     CMP14+EGFR -     CBC  Other chronic pancreatitis ([place]) -     Ambulatory referral to Gastroenterology    Patient has been counseled on age-appropriate routine health concerns for screening and prevention. These are reviewed and up-to-date. Referrals have been placed accordingly. Immunizations are up-to-date or declined.    Subjective:   Chief Complaint  Patient presents with  . Hot Flashes  . Chills   HPI Marco Pittman 58 y.o. male presents to office today endorsing hot and cold body temperature variations with increased activity. Last TSH 10-10-2016 1.260.  Onset one week ago with vomiting and decreased po intake. He stopped taking all of his medications 2 months ago due to finances so he has been out of Ursodiol 500 mg BID and his amlodipine 66m daily.  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 His current symptoms are likely related to his noncompliance with ursodiol. He has been instructed today to follow up with GI. Ursodiol was refilled today as a cManufacturing engineer  Patient has been advised to apply for financial assistance and schedule to see our financial counselor.   PER PCP NOTE 06-16-2017: S/P colonoscopy on September 20/2018 which showed small sessile serrated adenoma in descending colon as well as 5 mm ganglioneuroma in the descending colon. He also had diverticulosis, hemorrhoids, and hypertrophied anal papilla.He will need repeat colonoscopy in 1-2 years.  Primary biliary cirrhosis was also  confirmed.  Patient started on ursodiol.  He has this medicine with him today and reports that he is tolerating it okay.  Has follow-up appointment with the gastroenterologist on the second of next month (unfortunately he did not follow up).    Essential Hypertension Chronic. Blood pressure is not well controlled. He has been out of his antihypertensive amlodipine 153mdaily for several months. Denies chest pain, shortness of breath, palpitations, lightheadedness, dizziness, headaches or BLE edema.  BP Readings from Last 3 Encounters:  04/13/18 (!) 150/104  06/16/17 (!) 148/89  01/27/17 (!) 157/101    Review of Systems  Constitutional: Negative for fever, malaise/fatigue and weight loss.       SEE HPI  HENT: Negative.  Negative for nosebleeds.   Eyes: Negative.  Negative for blurred vision, double vision and photophobia.  Respiratory: Negative.  Negative for cough and shortness of breath.   Cardiovascular: Negative.  Negative for chest pain, palpitations and leg swelling.  Gastrointestinal: Positive for abdominal pain, nausea and vomiting. Negative for blood in stool, constipation, diarrhea, heartburn and melena.  Musculoskeletal: Negative.  Negative for myalgias.  Neurological: Negative.  Negative for dizziness, focal weakness, seizures and headaches.  Psychiatric/Behavioral: Negative.  Negative for suicidal ideas.    Past Medical History:  Diagnosis Date  . [place]neurysm ([facility]  . ETOH abuse   . Hypertension   . Pancreatitis 03/04/2016  . Umbilical hernia    symptomatic umbilical hernia/notes 1158/[id number]/0277  Past Surgical History:  Procedure Laterality Date  .  INSERTION OF MESH N/A 04/14/2016   Procedure: INSERTION OF MESH;  Surgeon: Jackolyn Confer, MD;  Location: [place];  Service: General;  Laterality: N/A;  . NO PAST SURGERIES    . UMBILICAL HERNIA REPAIR N/A 04/14/2016   Procedure: UMBILICAL HERNIA REPAIR WITH MESH;  Surgeon: Jackolyn Confer, MD;  Location: [place];  Service: General;  Laterality: N/A;    Family History  Problem Relation Age of Onset  . Cancer Father 36       colon    Social History Reviewed with no changes to be made today.   Outpatient Medications Prior to Visit  Medication Sig Dispense Refill  . amLODipine (NORVASC) 10 MG tablet Take 1 tablet (10 mg total) by mouth daily. 90 tablet 3  . ursodiol (ACTIGALL) 500 MG tablet Take 500 mg by mouth 2 (two) times daily.    . diclofenac sodium (VOLTAREN) 1 % GEL Apply 2 g topically 4 (four) times daily. (Patient not taking: Reported on 04/13/2018) 100 g 1  . feeding supplement (BOOST / RESOURCE BREEZE) LIQD Take 1 Container by mouth 3 (three) times daily between meals. (Patient not taking: Reported on 10/10/2016) 237 mL 3   No facility-administered medications prior to visit.     No Known Allergies     Objective:    BP (!) 150/104   Pulse 83   Temp 98 F (36.7 C) (Oral)   Ht 5' 9"  (1.753 m)   Wt 172 lb (78 kg)   SpO2 99%   BMI 25.40 kg/m  Wt Readings from Last 3 Encounters:  04/13/18 172 lb (78 kg)  06/16/17 189 lb 3.2 oz (85.8 kg)  01/27/17 172 lb (78 kg)    Physical Exam Vitals signs and nursing note reviewed.  Constitutional:      Appearance: He is well-developed.  HENT:     Head: Normocephalic and atraumatic.  Neck:     Musculoskeletal: Normal range of motion.  Cardiovascular:     Rate and Rhythm: Normal rate and regular rhythm.     Heart sounds: Normal heart sounds. No murmur. No friction rub. No gallop.   Pulmonary:     Effort: Pulmonary effort is normal. No tachypnea or respiratory distress.     Breath sounds: Normal breath sounds. No decreased breath sounds, wheezing, rhonchi or rales.  Chest:     Chest wall: No tenderness.  Abdominal:     General: Abdomen is protuberant. Bowel sounds are normal.     Palpations: Abdomen is soft.     Tenderness: There is no abdominal tenderness. There is no right CVA tenderness or left CVA tenderness.       Hernia: No hernia is present.  Musculoskeletal: Normal range of motion.  Skin:    General: Skin is warm and dry.  Neurological:     Mental Status: He is alert and oriented to person, place, and time.     Coordination: Coordination normal.  Psychiatric:        Behavior: Behavior normal. Behavior is cooperative.        Thought Content: Thought content normal.        Judgment: Judgment normal.         Patient has been counseled extensively about nutrition and exercise as well as the importance of adherence with medications and regular follow-up. The patient was given clear instructions to go to ER or return to medical center if symptoms don't improve, worsen or new problems develop. The patient verbalized  understanding.   Follow-up: Return in about 3 weeks (around 05/04/2018) for BP recheck with luke then f/u 2 months dr Wynetta Emery.   Gildardo Pounds, FNP-BC [facility] [place], [place]   04/14/2018, 11:34 PM

## 2018-04-14 ENCOUNTER — Encounter: Payer: Self-pay | Admitting: Nurse Practitioner

## 2018-04-14 LAB — CMP14+EGFR
ALBUMIN: 4.1 g/dL (ref 3.5–5.5)
ALK PHOS: 83 IU/L (ref 39–117)
ALT: 13 IU/L (ref 0–44)
AST: 19 IU/L (ref 0–40)
Albumin/Globulin Ratio: 1.2 (ref 1.2–2.2)
BILIRUBIN TOTAL: 1 mg/dL (ref 0.0–1.2)
BUN / CREAT RATIO: 13 (ref 9–20)
BUN: 11 mg/dL (ref 6–24)
CO2: 21 mmol/L (ref 20–29)
CREATININE: 0.87 mg/dL (ref 0.76–1.27)
Calcium: 9.4 mg/dL (ref 8.7–10.2)
Chloride: 100 mmol/L (ref 96–106)
GFR calc Af Amer: 109 mL/min/{1.73_m2} (ref >59)
GFR calc non Af Amer: 94 mL/min/{1.73_m2} (ref >59)
GLUCOSE: 108 mg/dL — AB (ref 65–99)
Globulin, Total: 3.5 g/dL (ref 1.5–4.5)
Potassium: 3.1 mmol/L — ABNORMAL LOW (ref 3.5–5.2)
Sodium: 138 mmol/L (ref 134–144)
Total Protein: 7.6 g/dL (ref 6.0–8.5)

## 2018-04-14 LAB — CBC
HEMATOCRIT: 48.6 % (ref 37.5–51.0)
HEMOGLOBIN: 16.9 g/dL (ref 13.0–17.7)
MCH: 31.7 pg (ref 26.6–33.0)
MCHC: 34.8 g/dL (ref 31.5–35.7)
MCV: 91 fL (ref 79–97)
Platelets: 185 10*3/uL (ref 150–450)
RBC: 5.33 x10E6/uL (ref 4.14–5.80)
RDW: 13.8 % (ref 11.6–15.4)
WBC: 5.9 10*3/uL (ref 3.4–10.8)

## 2018-04-14 MED ORDER — AMLODIPINE BESYLATE 10 MG PO TABS: 10.0000 mg | ORAL_TABLET | Freq: Every day | ORAL | 0 refills | Status: DC

## 2018-04-14 MED ORDER — URSODIOL 500 MG PO TABS: 500.0000 mg | ORAL_TABLET | Freq: Two times a day (BID) | ORAL | 1 refills | Status: DC

## 2018-04-20 ENCOUNTER — Telehealth (INDEPENDENT_AMBULATORY_CARE_PROVIDER_SITE_OTHER): Payer: Self-pay

## 2018-04-20 NOTE — Telephone Encounter (Signed)
Patient verified DOB. He is aware that his potassium levels has decreased. Advised to eat foods rich in potassium such as bananas, sweet potatoes, spinach and salmon. Make a lab appointment to have potassium rechecked in 6 weeks. Maryjean Morn, CMA

## 2018-04-20 NOTE — Telephone Encounter (Signed)
-----   Message from Claiborne Rigg, NP sent at 04/14/2018 11:57 PM EST ----- Potassium level has decreased. Please make sure you are eating potassium rich foods like bananas, sweet potatoes, spinach and salmon. You can also research potassium rich foods on the internet. Please make a lab appointment to have your potassium rechecked in 6 weeks.

## 2018-05-05 ENCOUNTER — Ambulatory Visit: Payer: Self-pay | Attending: Family Medicine | Admitting: Family Medicine

## 2018-05-05 ENCOUNTER — Encounter: Payer: Self-pay | Admitting: Family Medicine

## 2018-05-05 VITALS — BP 145/85 | HR 83 | Temp 98.9°F | Resp 18 | Ht 69.5 in | Wt 185.0 lb

## 2018-05-05 DIAGNOSIS — I10 Essential (primary) hypertension: Secondary | ICD-10-CM

## 2018-05-05 DIAGNOSIS — E876 Hypokalemia: Secondary | ICD-10-CM | POA: Diagnosis not present

## 2018-05-05 DIAGNOSIS — K745 Biliary cirrhosis, unspecified: Secondary | ICD-10-CM

## 2018-05-05 DIAGNOSIS — R7303 Prediabetes: Secondary | ICD-10-CM

## 2018-05-05 LAB — POCT GLYCOSYLATED HEMOGLOBIN (HGB A1C): Hemoglobin A1C: 5.9 % — AB (ref 4.0–5.6)

## 2018-05-05 MED ORDER — AMLODIPINE BESYLATE 10 MG PO TABS: 10.0000 mg | ORAL_TABLET | Freq: Every day | ORAL | 3 refills | Status: DC

## 2018-05-05 MED ORDER — URSODIOL 500 MG PO TABS: 500.0000 mg | ORAL_TABLET | Freq: Two times a day (BID) | ORAL | 3 refills | Status: AC

## 2018-05-05 MED FILL — URSODIOL 500 MG TABLET: 500 | 30 days supply | Qty: 60 | Fill #0

## 2018-05-05 MED FILL — AMLODIPINE BESYLATE 10 MG T: 10 | 30 days supply | Qty: 30 | Fill #0

## 2018-05-05 NOTE — Progress Notes (Signed)
Subjective:    Patient ID: Marco Pittman, male    DOB: [birth year], 58 y.o.   MRN: [id number]  HPI       58 year old patient of Dr. Henriette Combs who presents to the office with a complaint of only receiving limited supplies of his blood pressure medication and his medication for biliary cirrhosis, Ursodial.  Patient also reports that the price of his medications is greatly increased.  Patient complains of only receiving 2 pills at a time of his medications when he goes to pharmacy.  Patient was last seen in the office on 04/13/2018 and at that time had reported being out of medications for approximately 2 months prior to the visit and patient per the note was given refills.  Patient was also referred to gastroenterology for follow-up of his history of chronic pancreatitis, biliary cirrhosis and common bile duct stricture patient was also instructed to apply for financial assistance through this clinic.  Patient at today's visit again reports the need for refills of medications.  Patient denies any current issues with headaches or dizziness related to his blood pressure.  Patient denies any current abdominal pain, no nausea or vomiting.  Patient states that he had blood work done in his most recent visit and that he was contacted and told that his cholesterol was elevated and patient would like to know what his current results are for his cholesterol.  Patient denies any issues with chest pain or palpitations.  Patient reports that he is status post recent repair of an umbilical hernia but denies any pain at the surgery site.  Patient denies any recent fever chills  Past Medical History:  Diagnosis Date  . Aneurysm (HCC)   . ETOH abuse   . Hypertension   . Pancreatitis 03/04/2016  . Umbilical hernia    symptomatic umbilical hernia/notes 03/04/2016   Past Surgical History:  Procedure Laterality Date  . INSERTION OF MESH N/A 04/14/2016   Procedure: INSERTION OF MESH;  Surgeon: Avel Peace, MD;   Location: [facility];  Service: General;  Laterality: N/A;  . UMBILICAL HERNIA REPAIR N/A 04/14/2016   Procedure: UMBILICAL HERNIA REPAIR WITH MESH;  Surgeon: Avel Peace, MD;  Location: [facility];  Service: General;  Laterality: N/A;   Family History  Problem Relation Age of Onset  . Cancer Father 77       colon  . Social History   Tobacco Use  . Smoking status: Current Every Day Smoker    Packs/day: 0.50    Years: 45.00    Pack years: 22.50    Types: Cigars  . Smokeless tobacco: Never Used  Substance Use Topics  . Alcohol use: Yes    Alcohol/week: 14.0 standard drinks    Types: 14 Cans of beer per week    Comment: last alcohol on 03/29/16  . Drug use: No  No Known Allergies          Review of Systems  Constitutional: Positive for fatigue. Negative for chills and fever.  Respiratory: Negative for cough and shortness of breath.   Cardiovascular: Negative for chest pain, palpitations and leg swelling.  Gastrointestinal: Negative for abdominal pain, blood in stool, constipation, diarrhea and nausea.  Endocrine: Negative for polydipsia, polyphagia and polyuria.  Genitourinary: Negative for dysuria and frequency.  Musculoskeletal: Negative for arthralgias and gait problem.  Neurological: Negative for dizziness and headaches.  Hematological: Negative for adenopathy. Does not bruise/bleed easily.       Objective:   Physical  Exam BP (!) 145/85 (BP Location: Left Arm, Patient Position: Sitting, Cuff Size: Normal)   Pulse 83   Temp 98.9 F (37.2 C) (Oral)   Resp 18   Ht 5' 9.5" (1.765 m)   Wt 185 lb (83.9 kg)   SpO2 100%   BMI 26.93 kg/m Nurse's notes and vital signs reviewed General-well-nourished, well-developed older male in no acute distress Neck-supple, no lymphadenopathy Lungs-clear to auscultation bilaterally Cardiovascular-regular rate and rhythm Abdomen- positive bowel sounds, soft, patient denies any abdominal tenderness with  examination/palpation.  No rebound or guarding. Back-no CVA tenderness Extremities-no edema       Assessment & Plan:  1. Essential hypertension Patient with complaint of difficulty affording his medication secondary to cost.  Patient's medication will be sent to this pharmacy to see if it can be obtained for cheaper price.  Patient also spoke with the social worker at today's visit regarding resources to help with medications and medical follow-up.  Patient has had issues with hypokalemia and patient will have BMP at today's visit in follow-up - Basic Metabolic Panel - amLODipine (NORVASC) 10 MG tablet; Take 1 tablet (10 mg total) by mouth daily. To lower blood pressure  Dispense: 30 tablet; Refill: 3  2. Biliary cirrhosis (HCC) Patient with history of biliary cirrhosis and patient is provided with refill of ursodiol and patient at his recent visit on January 7 was referred back to GI for further evaluation and treatment - ursodiol (ACTIGALL) 500 MG tablet; Take 1 tablet (500 mg total) by mouth 2 (two) times daily for 30 days.  Dispense: 60 tablet; Refill: 3  3. Hypokalemia Patient with potassium level of 3.1 on lab work done 04/13/2018 and patient will have BMP to recheck electrolytes as well as kidney function due to his hypertension.  Patient also with history of prediabetes for which BMP will also be checked. - Basic Metabolic Panel  4. Pre-diabetes Patient with a history of prediabetes with hemoglobin A1c of 6.1 on October 10, 2016 and as per protocol, patient had recheck of hemoglobin A1c at today's visit which was improved at 5.9.  Patient reports that he has made dietary changes and has had some weight loss.  Patient will also have BMP in follow-up of prediabetes.  Patient should continue a low-carb/healthy diet. - HgB A1c  An After Visit Summary was printed and given to the patient.  Return for HTN-NV BP check; 4 month f/u with PCP.

## 2018-05-06 LAB — BASIC METABOLIC PANEL WITH GFR
BUN/Creatinine Ratio: 11 (ref 9–20)
BUN: 9 mg/dL (ref 6–24)
CO2: 19 mmol/L — ABNORMAL LOW (ref 20–29)
Calcium: 9 mg/dL (ref 8.7–10.2)
Chloride: 106 mmol/L (ref 96–106)
Creatinine, Ser: 0.83 mg/dL (ref 0.76–1.27)
GFR calc Af Amer: 111 mL/min/1.73
GFR calc non Af Amer: 96 mL/min/1.73
Glucose: 84 mg/dL (ref 65–99)
Potassium: 4.4 mmol/L (ref 3.5–5.2)
Sodium: 144 mmol/L (ref 134–144)

## 2018-05-12 ENCOUNTER — Telehealth: Payer: Self-pay | Admitting: *Deleted

## 2018-05-12 NOTE — Telephone Encounter (Signed)
MA unable to reach patient or leave a detailed message. 

## 2018-05-12 NOTE — Telephone Encounter (Signed)
-----   Message from Cain Saupeammie Fulp, MD sent at 05/09/2018  9:37 PM EST ----- Please notify patient of normal BMP. Hgb A1c of 5.9 which is consistent with prediabetes

## 2018-06-01 MED FILL — AMLODIPINE BESYLATE 10 MG T: 10 | 30 days supply | Qty: 30 | Fill #1

## 2018-07-19 MED FILL — URSODIOL 500 MG TABLET: 500 | 30 days supply | Qty: 60 | Fill #1

## 2018-07-19 MED FILL — AMLODIPINE BESYLATE 10 MG T: 10 | 30 days supply | Qty: 30 | Fill #2

## 2018-08-17 MED FILL — AMLODIPINE BESYLATE 10 MG T: 10 | 30 days supply | Qty: 30 | Fill #3

## 2018-09-13 ENCOUNTER — Other Ambulatory Visit: Payer: Self-pay | Admitting: Family Medicine

## 2018-09-13 DIAGNOSIS — I10 Essential (primary) hypertension: Secondary | ICD-10-CM

## 2018-09-13 MED FILL — AMLODIPINE BESYLATE 10 MG T: 10 | 30 days supply | Qty: 30 | Fill #0

## 2018-09-13 MED FILL — URSODIOL 500 MG TABLET: 500 | 30 days supply | Qty: 60 | Fill #2

## 2018-10-20 ENCOUNTER — Other Ambulatory Visit: Payer: Self-pay | Admitting: Family Medicine

## 2018-10-20 DIAGNOSIS — I10 Essential (primary) hypertension: Secondary | ICD-10-CM

## 2018-10-29 ENCOUNTER — Other Ambulatory Visit: Payer: Self-pay | Admitting: Internal Medicine

## 2018-10-29 DIAGNOSIS — I10 Essential (primary) hypertension: Secondary | ICD-10-CM

## 2018-10-29 NOTE — Telephone Encounter (Signed)
1) Medication(s) Requested (by name):AMLODIPINE  2) Pharmacy of Choice: [facility]  3) Special Requests: PT HAS APPT 11/29/18 [facility].   Approved medications will be sent to the pharmacy, we will reach out if there is an issue.  Requests made after 3pm may not be addressed until the following business day!  If a patient is unsure of the name of the medication(s) please note and ask patient to call back when they are able to provide all info, do not send to responsible party until all information is available!

## 2018-10-30 MED ORDER — AMLODIPINE BESYLATE 10 MG PO TABS: 10.0000 mg | ORAL_TABLET | Freq: Every day | ORAL | 0 refills | Status: DC

## 2018-11-04 ENCOUNTER — Telehealth: Payer: Self-pay | Admitting: Internal Medicine

## 2018-11-04 NOTE — Telephone Encounter (Signed)
Pt has an appointment with Dr Wynetta Emery 11/29/18 and he need a refill for  Amlopidine  10 mg . Patient use  [facility]  Thank you

## 2018-11-04 NOTE — Telephone Encounter (Signed)
rx was sent to [facility] in [place] on 10/30/18

## 2018-11-29 ENCOUNTER — Encounter: Payer: Self-pay | Admitting: Internal Medicine

## 2018-11-29 ENCOUNTER — Other Ambulatory Visit: Payer: Self-pay

## 2018-11-29 ENCOUNTER — Ambulatory Visit: Payer: Medicaid Other | Attending: Internal Medicine | Admitting: Internal Medicine

## 2018-11-29 VITALS — BP 165/113 | HR 82 | Temp 98.8°F | Resp 16 | Ht 69.5 in | Wt 179.6 lb

## 2018-11-29 DIAGNOSIS — K743 Primary biliary cirrhosis: Secondary | ICD-10-CM | POA: Diagnosis not present

## 2018-11-29 DIAGNOSIS — Z7901 Long term (current) use of anticoagulants: Secondary | ICD-10-CM | POA: Diagnosis not present

## 2018-11-29 DIAGNOSIS — Z2821 Immunization not carried out because of patient refusal: Secondary | ICD-10-CM

## 2018-11-29 DIAGNOSIS — G8929 Other chronic pain: Secondary | ICD-10-CM | POA: Insufficient documentation

## 2018-11-29 DIAGNOSIS — I10 Essential (primary) hypertension: Secondary | ICD-10-CM | POA: Insufficient documentation

## 2018-11-29 DIAGNOSIS — Z7189 Other specified counseling: Secondary | ICD-10-CM

## 2018-11-29 DIAGNOSIS — R7303 Prediabetes: Secondary | ICD-10-CM | POA: Diagnosis not present

## 2018-11-29 DIAGNOSIS — F17290 Nicotine dependence, other tobacco product, uncomplicated: Secondary | ICD-10-CM | POA: Diagnosis not present

## 2018-11-29 DIAGNOSIS — Z79899 Other long term (current) drug therapy: Secondary | ICD-10-CM | POA: Insufficient documentation

## 2018-11-29 DIAGNOSIS — F17210 Nicotine dependence, cigarettes, uncomplicated: Secondary | ICD-10-CM | POA: Diagnosis not present

## 2018-11-29 DIAGNOSIS — F17200 Nicotine dependence, unspecified, uncomplicated: Secondary | ICD-10-CM

## 2018-11-29 MED ORDER — AMLODIPINE BESYLATE 10 MG PO TABS: 10.0000 mg | ORAL_TABLET | Freq: Every day | ORAL | 2 refills | Status: DC

## 2018-11-29 MED ORDER — URSODIOL 500 MG PO TABS: 500.0000 mg | ORAL_TABLET | Freq: Three times a day (TID) | ORAL | 2 refills | Status: DC

## 2018-11-29 MED FILL — AMLODIPINE BESYLATE 10 MG T: 10 | 90 days supply | Qty: 90 | Fill #0

## 2018-11-29 MED FILL — URSODIOL 500 MG TABLET: 500 | 30 days supply | Qty: 90 | Fill #0

## 2018-11-29 NOTE — Progress Notes (Signed)
Patient ID: Pollyann SavoyMichael W Tomaszewski, male    DOB: 09/28/1960  MRN: [id number]  CC: Hypertension   Subjective: Edwin CapMichael Preslar is a 58 y.o. 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  HYPERTENSION Currently taking: see medication list Med Adherence: [x]  Yes   but out of this medication x2 weeks. Medication side effects: []  Yes    [x]  No Adherence with salt restriction: [x]  Yes    []  No Home Monitoring?: []  Yes    [x]  No Monitoring Frequency: []  Yes    []  No Home BP results range: []  Yes    []  No SOB? []  Yes    [x]  No Chest Pain?: []  Yes    [x]  No Leg swelling?: []  Yes    [x]  No Headaches?: []  Yes    [x]  No Dizziness? []  Yes    [x]  No Comments:   PBC:  Has not seen GI in a while.  Needing refill on your Ursodiol  Tob dep:  Back to smoking 2-3 cig/day.  Plans to quit again.   ETOH:  Drinks a couple of beers a day "if I'm around my friends."  PreDM: Patient states that he has cut back on sweets but he does love orange juice.  He is fairly active.  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  Of note he goes in at 3:00 after the students have already left.  He has been consistently wearing a mask when in public. Patient Active Problem List   Diagnosis Date Noted  . Former smoker 06/16/2017  . 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 Sessile colonic polyp 01/29/2017  . Pre-diabetes 01/27/2017  . Chronic left shoulder pain 01/27/2017  . Weight loss, unintentional 10/10/2016  . History of alcohol abuse 03/19/2016  . Chronic pancreatitis (HCC) 03/04/2016  . Essential hypertension, benign 10/06/2012     Current Outpatient Medications on File Prior to Visit  Medication Sig Dispense Refill  . amLODipine (NORVASC) 10 MG tablet Take 1 tablet  (10 mg total) by mouth daily. To lower blood pressure 30 tablet 0  . ursodiol (ACTIGALL) 500 MG tablet Take 500 mg by mouth 3 (three) times daily. Take 1 tablet for 2 days for 30 days     No current facility-administered medications on file prior to visit.     No Known Allergies  Social History   Socioeconomic History  . Marital status: Single    Spouse name: Not on file  . Number of children: Not on file  . Years of education: Not on file  . Highest education level: Not on file  Occupational History  . Not on file  Social Needs  . Financial resource strain: Not on file  . Food insecurity    Worry: Not on file    Inability: Not on file  . Transportation needs    Medical: Not on file    Non-medical: Not on file  Tobacco Use  . Smoking status: Current Every Day Smoker    Packs/day: 0.50    Years: 45.00    Pack years: 22.50    Types: Cigars  . Smokeless tobacco: Never Used  Substance and Sexual Activity  . Alcohol use: Yes    Alcohol/week: 14.0 standard drinks  Types: 14 Cans of beer per week    Comment: last alcohol on 03/29/16  . Drug use: No  . Sexual activity: Yes  Lifestyle  . Physical activity    Days per week: Not on file    Minutes per session: Not on file  . Stress: Not on file  Relationships  . Social Herbalist on phone: Not on file    Gets together: Not on file    Attends religious service: Not on file    Active member of club or organization: Not on file    Attends meetings of clubs or organizations: Not on file    Relationship status: Not on file  . Intimate partner violence    Fear of current or ex partner: Not on file    Emotionally abused: Not on file    Physically abused: Not on file    Forced sexual activity: Not on file  Other Topics Concern  . Not on file  Social History Narrative  . Not on file    Family History  Problem Relation Age of Onset  . Cancer Father 70       colon    Past Surgical History:  Procedure  Laterality Date  . INSERTION OF MESH N/A 04/14/2016   Procedure: INSERTION OF MESH;  Surgeon: Jackolyn Confer, MD;  Location: [facility];  Service: General;  Laterality: N/A;  . UMBILICAL HERNIA REPAIR N/A 04/14/2016   Procedure: UMBILICAL HERNIA REPAIR WITH MESH;  Surgeon: Jackolyn Confer, MD;  Location: [facility];  Service: General;  Laterality: N/A;    ROS: Review of Systems Negative except as stated above  PHYSICAL EXAM: BP (!) 165/113   Pulse 82   Temp 98.8 F (37.1 C) (Oral)   Resp 16   Ht 5' 9.5" (1.765 m)   Wt 179 lb 9.6 oz (81.5 kg)   SpO2 96%   BMI 26.14 kg/m   Physical Exam  General appearance - alert, well appearing, and in no distress Mental status - normal mood, behavior, speech, dress, motor activity, and thought processes Mouth - mucous membranes moist, pharynx normal without lesions Neck - supple, no significant adenopathy Chest - clear to auscultation, no wheezes, rales or rhonchi, symmetric air entry Heart - normal rate, regular rhythm, normal S1, S2, no murmurs, rubs, clicks or gallops Abdomen: Normal bowel sounds.  Soft.  Nontender.  No organomegaly Extremities - peripheral pulses normal, no pedal edema, no clubbing or cyanosis  CMP Latest Ref Rng & Units 05/05/2018 04/13/2018 10/10/2016  Glucose 65 - 99 mg/dL 84 108(H) 142(H)  BUN 6 - 24 mg/dL 9 11 9   Creatinine 0.76 - 1.27 mg/dL 0.83 0.87 0.85  Sodium 134 - 144 mmol/L 144 138 137  Potassium 3.5 - 5.2 mmol/L 4.4 3.1(L) 3.4(L)  Chloride 96 - 106 mmol/L 106 100 98  CO2 20 - 29 mmol/L 19(L) 21 22  Calcium 8.7 - 10.2 mg/dL 9.0 9.4 9.4  Total Protein 6.0 - 8.5 g/dL - 7.6 7.4  Total Bilirubin 0.0 - 1.2 mg/dL - 1.0 1.0  Alkaline Phos 39 - 117 IU/L - 83 649(H)  AST 0 - 40 IU/L - 19 51(H)  ALT 0 - 44 IU/L - 13 112(H)   Lipid Panel     Component Value Date/Time   CHOL 169 10/10/2016 0959   TRIG 124 10/10/2016 0959   HDL 35 (L) 10/10/2016 0959   CHOLHDL 4.8 10/10/2016 0959    CHOLHDL 4.2 03/05/2016  0543   VLDL 12 03/05/2016 0543   LDLCALC 109 (H) 10/10/2016 0959    CBC    Component Value Date/Time   WBC 5.9 04/13/2018 1623   WBC 4.3 03/05/2016 0543   RBC 5.33 04/13/2018 1623   RBC 4.92 03/05/2016 0543   HGB 16.9 04/13/2018 1623   HCT 48.6 04/13/2018 1623   PLT 185 04/13/2018 1623   MCV 91 04/13/2018 1623   MCH 31.7 04/13/2018 1623   MCH 31.1 03/05/2016 0543   MCHC 34.8 04/13/2018 1623   MCHC 35.6 03/05/2016 0543   RDW 13.8 04/13/2018 1623   LYMPHSABS 1.6 12/21/2012 1420   LYMPHSABS 1.1 10/06/2012 1452   MONOABS 0.5 12/21/2012 1420   EOSABS 0.0 12/21/2012 1420   EOSABS 0.0 10/06/2012 1452   BASOSABS 0.0 12/21/2012 1420   BASOSABS 0.0 10/06/2012 1452   Results for orders placed or performed in visit on 05/05/18  Basic Metabolic Panel  Result Value Ref Range   Glucose 84 65 - 99 mg/dL   BUN 9 6 - 24 mg/dL   Creatinine, Ser 9.600.83 0.76 - 1.27 mg/dL   GFR calc non Af Amer 96 >59 mL/min/1.73   GFR calc Af Amer 111 >59 mL/min/1.73   BUN/Creatinine Ratio 11 9 - 20   Sodium 144 134 - 144 mmol/L   Potassium 4.4 3.5 - 5.2 mmol/L   Chloride 106 96 - 106 mmol/L   CO2 19 (L) 20 - 29 mmol/L   Calcium 9.0 8.7 - 10.2 mg/dL  HgB A5WA1c  Result Value Ref Range   Hemoglobin A1C 5.9 (A) 4.0 - 5.6 %   HbA1c POC (<> result, manual entry)     HbA1c, POC (prediabetic range)     HbA1c, POC (controlled diabetic range)       ASSESSMENT AND PLAN: 1. Essential hypertension Not at goal.  Patient has been out of this medication.  Refill given.  Encourage DASH diet. - amLODipine (NORVASC) 10 MG tablet; Take 1 tablet (10 mg total) by mouth daily. To lower blood pressure  Dispense: 90 tablet; Refill: 2  2. Primary biliary cirrhosis (HCC) - ursodiol (ACTIGALL) 500 MG tablet; Take 1 tablet (500 mg total) by mouth 3 (three) times daily.  Dispense: 90 tablet; Refill: 2 - Ambulatory referral to Gastroenterology  3. Tobacco dependence Encouraged him to quit.  He is  willing and ready to give a trial of quitting again.  He declines nicotine replacement therapy, Chantix or Wellbutrin.  He will try to quit on his own.  Less than 5 minutes spent on counseling.  4. Pre-diabetes Dietary counseling given.  Encouraged him to stay active.  5. Influenza vaccination declined   6. Educated About Covid-19 Virus Infection Discussed the importance of wearing a mask when in public, good and frequent handwashing and social distancing.    Patient was given the opportunity to ask questions.  Patient verbalized understanding of the plan and was able to repeat key elements of the plan.   No orders of the defined types were placed in this encounter.    Requested Prescriptions    No prescriptions requested or ordered in this encounter    No follow-ups on file.  Jonah Blueeborah Johnson, MD, FACP

## 2018-12-14 ENCOUNTER — Telehealth: Payer: Self-pay | Admitting: *Deleted

## 2018-12-14 NOTE — Telephone Encounter (Signed)
!!!  Please schedule a Nurse or CPP appointment for vaccines!!! 

## 2019-02-22 MED FILL — AMLODIPINE BESYLATE 10 MG T: 10 | 90 days supply | Qty: 90 | Fill #1

## 2019-02-22 MED FILL — URSODIOL 500 MG TABLET: 500 | 30 days supply | Qty: 90 | Fill #1

## 2019-05-23 MED FILL — AMLODIPINE BESYLATE 10 MG T: 10 | 90 days supply | Qty: 90 | Fill #2

## 2019-05-23 MED FILL — URSODIOL 500 MG TABLET: 500 | 30 days supply | Qty: 90 | Fill #2

## 2019-07-18 ENCOUNTER — Ambulatory Visit: Payer: Medicaid Other | Attending: Internal Medicine

## 2019-07-18 DIAGNOSIS — Z23 Encounter for immunization: Secondary | ICD-10-CM

## 2019-07-18 NOTE — Progress Notes (Signed)
   Covid-19 Vaccination Clinic  Name:  Marco Pittman    MRN: [id number] DOB: 05-30-1960  07/18/2019  Marco Pittman was observed post Covid-19 immunization for 15 minutes without incident. He was provided with Vaccine Information Sheet and instruction to access the V-Safe system.   Marco Pittman was instructed to call 911 with any severe reactions post vaccine: Marland Kitchen Difficulty breathing  . Swelling of face and throat  . A fast heartbeat  . A bad rash all over body  . Dizziness and weakness   Immunizations Administered    Name Date Dose VIS Date Route   Pfizer COVID-19 Vaccine 07/18/2019  8:13 AM 0.3 mL 03/18/2019 Intramuscular   Manufacturer: [employer], [employer]   Lot: [id number]   NDC: [address]-2

## 2019-08-10 ENCOUNTER — Ambulatory Visit: Payer: Medicaid Other | Attending: Internal Medicine

## 2019-08-10 DIAGNOSIS — Z23 Encounter for immunization: Secondary | ICD-10-CM

## 2019-08-10 NOTE — Progress Notes (Signed)
   Covid-19 Vaccination Clinic  Name:  Marco Pittman    MRN: [id number] DOB: [birth year]  08/10/2019  [name] was observed post Covid-19 immunization for 15 minutes without incident. He was provided with Vaccine Information Sheet and instruction to access the V-Safe system.   [name] was instructed to call 911 with any severe reactions post vaccine: Marland Kitchen Difficulty breathing  . Swelling of face and throat  . A fast heartbeat  . A bad rash all over body  . Dizziness and weakness   Immunizations Administered    Name Date Dose VIS Date Route   Pfizer COVID-19 Vaccine 08/10/2019  8:33 AM 0.3 mL 06/01/2018 Intramuscular   Manufacturer: [employer], [employer]   Lot: [id number]   NDC: [address]-9

## 2019-08-26 ENCOUNTER — Other Ambulatory Visit: Payer: Self-pay | Admitting: Internal Medicine

## 2019-08-26 DIAGNOSIS — I10 Essential (primary) hypertension: Secondary | ICD-10-CM

## 2019-08-26 MED FILL — AMLODIPINE BESYLATE 10 MG T: 10 | 30 days supply | Qty: 30 | Fill #0

## 2019-08-29 ENCOUNTER — Other Ambulatory Visit: Payer: Self-pay | Admitting: Internal Medicine

## 2019-08-29 DIAGNOSIS — K743 Primary biliary cirrhosis: Secondary | ICD-10-CM

## 2019-08-30 MED FILL — URSODIOL 500 MG TABLET: 500 | 30 days supply | Qty: 90 | Fill #0

## 2019-09-26 ENCOUNTER — Other Ambulatory Visit: Payer: Self-pay | Admitting: Internal Medicine

## 2019-09-26 DIAGNOSIS — I10 Essential (primary) hypertension: Secondary | ICD-10-CM

## 2019-09-28 ENCOUNTER — Other Ambulatory Visit: Payer: Self-pay | Admitting: Internal Medicine

## 2019-09-28 DIAGNOSIS — I10 Essential (primary) hypertension: Secondary | ICD-10-CM

## 2019-09-29 ENCOUNTER — Telehealth: Payer: Self-pay | Admitting: Internal Medicine

## 2019-09-29 DIAGNOSIS — I10 Essential (primary) hypertension: Secondary | ICD-10-CM

## 2019-09-29 MED ORDER — AMLODIPINE BESYLATE 10 MG PO TABS: 10.0000 mg | ORAL_TABLET | Freq: Every day | ORAL | 0 refills | Status: DC

## 2019-09-29 NOTE — Telephone Encounter (Signed)
Patient called in and requested for a refill of his bp medications until his upcoming appt. Please follow up at your earliest convenience.

## 2019-09-29 NOTE — Telephone Encounter (Signed)
Rx sent 

## 2019-09-30 MED FILL — AMLODIPINE BESYLATE 10 MG T: 10 | 30 days supply | Qty: 30 | Fill #0

## 2019-10-18 ENCOUNTER — Other Ambulatory Visit: Payer: Self-pay | Admitting: Internal Medicine

## 2019-10-18 ENCOUNTER — Encounter: Payer: Self-pay | Admitting: Internal Medicine

## 2019-10-18 ENCOUNTER — Other Ambulatory Visit: Payer: Self-pay

## 2019-10-18 ENCOUNTER — Ambulatory Visit: Payer: Medicaid Other | Attending: Internal Medicine | Admitting: Internal Medicine

## 2019-10-18 VITALS — BP 136/91 | HR 75 | Temp 98.4°F | Resp 16 | Ht 69.5 in | Wt 176.6 lb

## 2019-10-18 DIAGNOSIS — Z2821 Immunization not carried out because of patient refusal: Secondary | ICD-10-CM

## 2019-10-18 DIAGNOSIS — R7303 Prediabetes: Secondary | ICD-10-CM

## 2019-10-18 DIAGNOSIS — H547 Unspecified visual loss: Secondary | ICD-10-CM

## 2019-10-18 DIAGNOSIS — K743 Primary biliary cirrhosis: Secondary | ICD-10-CM

## 2019-10-18 DIAGNOSIS — I10 Essential (primary) hypertension: Secondary | ICD-10-CM

## 2019-10-18 DIAGNOSIS — F17200 Nicotine dependence, unspecified, uncomplicated: Secondary | ICD-10-CM | POA: Diagnosis not present

## 2019-10-18 DIAGNOSIS — Z1211 Encounter for screening for malignant neoplasm of colon: Secondary | ICD-10-CM

## 2019-10-18 MED ORDER — LISINOPRIL 10 MG PO TABS: 10.0000 mg | ORAL_TABLET | Freq: Every day | ORAL | 1 refills | Status: DC

## 2019-10-18 MED ORDER — AMLODIPINE BESYLATE 10 MG PO TABS: 10.0000 mg | ORAL_TABLET | Freq: Every day | ORAL | 1 refills | Status: DC

## 2019-10-18 MED ORDER — URSODIOL 500 MG PO TABS: ORAL_TABLET | ORAL | 1 refills | Status: DC

## 2019-10-18 MED FILL — LISINOPRIL 10 MG TABS: 10 | 90 days supply | Qty: 90 | Fill #0

## 2019-10-18 NOTE — Progress Notes (Signed)
Patient ID: Marco Pittman, male    DOB: 28-Apr-1960  MRN: [id number]  CC: Hypertension   Subjective: Marco Pittman is a 59 y.o. 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  HYPERTENSION Currently taking: see medication list Med Adherence: [x]  Yes on Norvasc Medication side effects: []  Yes    [x]  No Adherence with salt restriction: [x]  Yes    []  No Home Monitoring?: []  Yes    [x]  No Monitoring Frequency: []  Yes    []  No Home BP results range: []  Yes    []  No SOB? []  Yes    [x]  No Chest Pain?: []  Yes    [x]  No Leg swelling?: []  Yes    [x]  No Headaches?: []  Yes    [x]  No Dizziness? []  Yes    [x]  No Comments:   Tob Dep: smoking 3 cigarettes/day.  Trying to quit. ETOH:  "I done almost stop that." Drinks two 24 oz beers "everyonce in a while."   Request referral to eye doctor.  Has problems seeing things close up and father away.  PBC:  Taking the Ursodiol.  Due for colonoscopy for colon cancer screening  PreDM:  Eating more broil meats. Eating a lot more fresh veggies from his own garden.   HM:  Declines Tdap Patient Active Problem List   Diagnosis Date Noted  . Former smoker 06/16/2017  . 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 Sessile colonic polyp 01/29/2017  . Pre-diabetes 01/27/2017  . Chronic left shoulder pain 01/27/2017  . Weight loss, unintentional 10/10/2016  . History of alcohol abuse 03/19/2016  . Chronic pancreatitis (HCC) 03/04/2016  . Essential hypertension, benign 10/06/2012     Current Outpatient Medications on File Prior to Visit  Medication Sig Dispense Refill  . amLODipine (NORVASC) 10 MG tablet Take 1 tablet (10 mg total) by mouth daily. Please make PCP appointment. 30 tablet 0  . ursodiol (ACTIGALL) 500 MG tablet TAKE 1 TABLET BY MOUTH 3 (THREE) TIMES DAILY. 90 tablet 0   No current facility-administered medications on file  prior to visit.    No Known Allergies  Social History   Socioeconomic History  . Marital status: Single    Spouse name: Not on file  . Number of children: Not on file  . Years of education: Not on file  . Highest education level: Not on file  Occupational History  . Not on file  Tobacco Use  . Smoking status: Current Every Day Smoker    Packs/day: 0.50    Years: 45.00    Pack years: 22.50    Types: Cigars  . Smokeless tobacco: Never Used  Substance and Sexual Activity  . Alcohol use: Yes    Alcohol/week: 14.0 standard drinks    Types: 14 Cans of beer per week    Comment: last alcohol on 03/29/16  . Drug use: No  . Sexual activity: Yes  Other Topics Concern  . Not on file  Social History Narrative  . Not on file   Social Determinants of Health   Financial Resource Strain:   . Difficulty of Paying Living Expenses:   Food Insecurity:   . Worried About in the Last Year:   . in the Last Year:   Transportation Needs:   . (Medical):   Lack of Transportation (Non-Medical):  Physical Activity:   . Days of Exercise per Week:   . Minutes of Exercise per Session:   Stress:   . Feeling of Stress :   Social Connections:   . Frequency of Communication with Friends and Family:   . Frequency of Social Gatherings with Friends and Family:   . Attends Religious Services:   . Active Member of Clubs or Organizations:   . Attends Banker Meetings:   Marland Kitchen Marital Status:   Intimate Partner Violence:   . Fear of Current or Ex-Partner:   . Emotionally Abused:   Marland Kitchen Physically Abused:   . Sexually Abused:     Family History  Problem Relation Age of Onset  . Cancer Father 51       colon    Past Surgical History:  Procedure Laterality Date  . INSERTION OF MESH N/A 04/14/2016   Procedure: INSERTION OF MESH;  Surgeon: Avel Peace, MD;  Location: [facility];  Service: General;  Laterality:  N/A;  . UMBILICAL HERNIA REPAIR N/A 04/14/2016   Procedure: UMBILICAL HERNIA REPAIR WITH MESH;  Surgeon: Avel Peace, MD;  Location: [facility];  Service: General;  Laterality: N/A;    ROS: Review of Systems Negative except as stated above  PHYSICAL EXAM: BP (!) 136/91 (BP Location: Right Arm, Patient Position: Sitting, Cuff Size: Normal)   Pulse 75   Temp 98.4 F (36.9 C)   Resp 16   Ht 5' 9.5" (1.765 m)   Wt 176 lb 9.6 oz (80.1 kg)   SpO2 98%   BMI 25.71 kg/m   Wt Readings from Last 3 Encounters:  10/18/19 176 lb 9.6 oz (80.1 kg)  11/29/18 179 lb 9.6 oz (81.5 kg)  05/05/18 185 lb (83.9 kg)    Physical Exam   General appearance - alert, well appearing, older African-American male and in no distress Mental status - normal mood, behavior, speech, dress, motor activity, and thought processes Neck - supple, no significant adenopathy Chest - clear to auscultation, no wheezes, rales or rhonchi, symmetric air entry Heart - normal rate, regular rhythm, normal S1, S2, no murmurs, rubs, clicks or gallops Extremities - peripheral pulses normal, no pedal edema, no clubbing or cyanosis  CMP Latest Ref Rng & Units 05/05/2018 04/13/2018 10/10/2016  Glucose 65 - 99 mg/dL 84 850(Y) 774(J)  BUN 6 - 24 mg/dL 9 11 9   Creatinine 0.76 - 1.27 mg/dL 2.87 8.67  Sodium 134 - 144 mmol/L 144 138 137  Potassium 3.5 - 5.2 mmol/L 4.4 3.1(L) 3.4(L)  Chloride 96 - 106 mmol/L 106 100 98  CO2 20 - 29 mmol/L 19(L) 21 22  Calcium 8.7 - 10.2 mg/dL 9.0 9.4 9.4  Total Protein 6.0 - 8.5 g/dL - 7.6 7.4  Total Bilirubin 0.0 - 1.2 mg/dL - 1.0 1.0  Alkaline Phos 39 - 117 IU/L - 83 649(H)  AST 0 - 40 IU/L - 19 51(H)  ALT 0 - 44 IU/L - 13 112(H)   Lipid Panel     Component Value Date/Time   CHOL 169 10/10/2016 0959   TRIG 124 10/10/2016 0959   HDL 35 (L) 10/10/2016 0959   CHOLHDL 4.8 10/10/2016 0959   CHOLHDL 4.2 03/05/2016 0543   VLDL 12 03/05/2016 0543   LDLCALC 109 (H) 10/10/2016  0959    CBC    Component Value Date/Time   WBC 5.9 04/13/2018 1623   WBC 4.3 03/05/2016 0543   RBC 5.33 04/13/2018 1623   RBC  4.92 03/05/2016 0543   HGB 16.9 04/13/2018 1623   HCT 48.6 04/13/2018 1623   PLT 185 04/13/2018 1623   MCV 91 04/13/2018 1623   MCH 31.7 04/13/2018 1623   MCH 31.1 03/05/2016 0543   MCHC 34.8 04/13/2018 1623   MCHC 35.6 03/05/2016 0543   RDW 13.8 04/13/2018 1623   LYMPHSABS 1.6 12/21/2012 1420   LYMPHSABS 1.1 10/06/2012 1452   MONOABS 0.5 12/21/2012 1420   EOSABS 0.0 12/21/2012 1420   EOSABS 0.0 10/06/2012 1452   BASOSABS 0.0 12/21/2012 1420   BASOSABS 0.0 10/06/2012 1452    ASSESSMENT AND PLAN:  1. Essential hypertension Not at goal.  Add lisinopril.  Continue low-salt diet. - CBC - Comprehensive metabolic panel - Lipid panel - amLODipine (NORVASC) 10 MG tablet; Take 1 tablet (10 mg total) by mouth daily.  Dispense: 90 tablet; Refill: 1 - lisinopril (ZESTRIL) 10 MG tablet; Take 1 tablet (10 mg total) by mouth daily.  Dispense: 90 tablet; Refill: 1  2. Primary biliary cirrhosis (HCC) - Ambulatory referral to Gastroenterology - ursodiol (ACTIGALL) 500 MG tablet; TAKE 1 TABLET BY MOUTH 3 (THREE) TIMES DAILY.  Dispense: 90 tablet; Refill: 1  3. Tobacco dependence Advised to quit.  Discussed health risks associated with smoking.  Patient states he is actively trying to quit.  He declines any medication to help with smoking cessation.  Less than 5 minutes spent on counseling.  4. Pre-diabetes Commended him on improving his eating habits.  Encouraged him to stay active. - Hemoglobin A1c  5. Poor vision - Ambulatory referral to Ophthalmology  6. Screening for colon cancer - Ambulatory referral to Gastroenterology  7. Tetanus, diphtheria, and acellular pertussis (Tdap) vaccination declined   Patient was given the opportunity to ask questions.  Patient verbalized understanding of the plan and was able to repeat key elements of the plan.   No  orders of the defined types were placed in this encounter.    Requested Prescriptions    No prescriptions requested or ordered in this encounter    No follow-ups on file.  Jonah Blue, MD, FACP

## 2019-10-18 NOTE — Patient Instructions (Signed)
Your blood pressure is not at goal.  Continue amlodipine.  We have added another blood pressure medication called lisinopril 10 mg daily.  Continue to limit salt in the foods.  I have referred you to the ophthalmologist for an eye exam.   I have referred you to the gastroenterologist for follow-up on your liver and for your colonoscopy.

## 2019-10-19 ENCOUNTER — Telehealth: Payer: Self-pay

## 2019-10-19 LAB — CBC
Hematocrit: 47.2 % (ref 37.5–51.0)
Hemoglobin: 16.2 g/dL (ref 13.0–17.7)
MCH: 32.1 pg (ref 26.6–33.0)
MCHC: 34.3 g/dL (ref 31.5–35.7)
MCV: 94 fL (ref 79–97)
Platelets: 174 10*3/uL (ref 150–450)
RBC: 5.05 x10E6/uL (ref 4.14–5.80)
RDW: 13.5 % (ref 11.6–15.4)
WBC: 4 10*3/uL (ref 3.4–10.8)

## 2019-10-19 LAB — COMPREHENSIVE METABOLIC PANEL
ALT: 18 IU/L (ref 0–44)
AST: 22 IU/L (ref 0–40)
Albumin/Globulin Ratio: 1.2 (ref 1.2–2.2)
Albumin: 4.4 g/dL (ref 3.8–4.8)
Alkaline Phosphatase: 100 IU/L (ref 48–121)
BUN/Creatinine Ratio: 7 — ABNORMAL LOW (ref 10–24)
BUN: 6 mg/dL — ABNORMAL LOW (ref 8–27)
Bilirubin Total: 0.5 mg/dL (ref 0.0–1.2)
CO2: 22 mmol/L (ref 20–29)
Calcium: 9.5 mg/dL (ref 8.6–10.2)
Chloride: 102 mmol/L (ref 96–106)
Creatinine, Ser: 0.89 mg/dL (ref 0.76–1.27)
GFR calc Af Amer: 107 mL/min/{1.73_m2} (ref >59)
GFR calc non Af Amer: 92 mL/min/{1.73_m2} (ref >59)
Globulin, Total: 3.8 g/dL (ref 1.5–4.5)
Glucose: 95 mg/dL (ref 65–99)
Potassium: 3.7 mmol/L (ref 3.5–5.2)
Sodium: 138 mmol/L (ref 134–144)
Total Protein: 8.2 g/dL (ref 6.0–8.5)

## 2019-10-19 LAB — LIPID PANEL
Chol/HDL Ratio: 3.1 ratio (ref 0.0–5.0)
Cholesterol, Total: 162 mg/dL (ref 100–199)
HDL: 52 mg/dL (ref >39)
LDL Chol Calc (NIH): 95 mg/dL (ref 0–99)
Triglycerides: 77 mg/dL (ref 0–149)
VLDL Cholesterol Cal: 15 mg/dL (ref 5–40)

## 2019-10-19 LAB — HEMOGLOBIN A1C
Est. average glucose Bld gHb Est-mCnc: 123 mg/dL
Hgb A1c MFr Bld: 5.9 % — ABNORMAL HIGH (ref 4.8–5.6)

## 2019-10-19 NOTE — Progress Notes (Signed)
Let patient know that his blood cell counts are normal.  This means his red blood cell, white blood cell and platelet counts are normal.  Kidney and liver function tests are normal.  Cholesterol levels normal.  He is still in the range for prediabetes.  Healthy eating habits and regular exercise will help prevent progression to diabetes.

## 2019-10-19 NOTE — Telephone Encounter (Signed)
Contacted pt to go over lab results pt is aware and doesn't have any questions or concerns 

## 2019-10-20 ENCOUNTER — Other Ambulatory Visit: Payer: Self-pay

## 2019-10-20 ENCOUNTER — Ambulatory Visit (HOSPITAL_BASED_OUTPATIENT_CLINIC_OR_DEPARTMENT_OTHER): Payer: Medicaid Other | Admitting: Internal Medicine

## 2019-10-20 ENCOUNTER — Encounter (HOSPITAL_COMMUNITY): Payer: Self-pay | Admitting: Emergency Medicine

## 2019-10-20 ENCOUNTER — Inpatient Hospital Stay (HOSPITAL_COMMUNITY)
Admission: EM | Admit: 2019-10-20 | Discharge: 2019-10-26 | DRG: 915 | Disposition: A | Discharge status: Home / Self Care | Payer: Medicaid Other | Attending: Internal Medicine | Admitting: Internal Medicine

## 2019-10-20 ENCOUNTER — Emergency Department (HOSPITAL_COMMUNITY): Payer: Medicaid Other

## 2019-10-20 ENCOUNTER — Encounter: Payer: Self-pay | Admitting: Family

## 2019-10-20 VITALS — BP 119/81 | HR 72 | Resp 16 | Wt 173.0 lb

## 2019-10-20 DIAGNOSIS — K743 Primary biliary cirrhosis: Secondary | ICD-10-CM | POA: Diagnosis present

## 2019-10-20 DIAGNOSIS — R001 Bradycardia, unspecified: Secondary | ICD-10-CM | POA: Diagnosis not present

## 2019-10-20 DIAGNOSIS — I119 Hypertensive heart disease without heart failure: Secondary | ICD-10-CM | POA: Diagnosis present

## 2019-10-20 DIAGNOSIS — K861 Other chronic pancreatitis: Secondary | ICD-10-CM | POA: Diagnosis present

## 2019-10-20 DIAGNOSIS — E860 Dehydration: Secondary | ICD-10-CM | POA: Diagnosis present

## 2019-10-20 DIAGNOSIS — G9341 Metabolic encephalopathy: Secondary | ICD-10-CM | POA: Diagnosis not present

## 2019-10-20 DIAGNOSIS — T464X5A Adverse effect of angiotensin-converting-enzyme inhibitors, initial encounter: Secondary | ICD-10-CM | POA: Diagnosis present

## 2019-10-20 DIAGNOSIS — E440 Moderate protein-calorie malnutrition: Secondary | ICD-10-CM | POA: Diagnosis not present

## 2019-10-20 DIAGNOSIS — T783XXA Angioneurotic edema, initial encounter: Secondary | ICD-10-CM | POA: Diagnosis not present

## 2019-10-20 DIAGNOSIS — F1010 Alcohol abuse, uncomplicated: Secondary | ICD-10-CM | POA: Diagnosis present

## 2019-10-20 DIAGNOSIS — Z6825 Body mass index (BMI) 25.0-25.9, adult: Secondary | ICD-10-CM

## 2019-10-20 DIAGNOSIS — Z888 Allergy status to other drugs, medicaments and biological substances status: Secondary | ICD-10-CM | POA: Diagnosis not present

## 2019-10-20 DIAGNOSIS — I952 Hypotension due to drugs: Secondary | ICD-10-CM | POA: Diagnosis present

## 2019-10-20 DIAGNOSIS — I10 Essential (primary) hypertension: Secondary | ICD-10-CM | POA: Diagnosis present

## 2019-10-20 DIAGNOSIS — J189 Pneumonia, unspecified organism: Secondary | ICD-10-CM | POA: Diagnosis not present

## 2019-10-20 DIAGNOSIS — Z79899 Other long term (current) drug therapy: Secondary | ICD-10-CM

## 2019-10-20 DIAGNOSIS — Z20822 Contact with and (suspected) exposure to covid-19: Secondary | ICD-10-CM | POA: Diagnosis not present

## 2019-10-20 DIAGNOSIS — J9601 Acute respiratory failure with hypoxia: Secondary | ICD-10-CM | POA: Diagnosis present

## 2019-10-20 DIAGNOSIS — J9600 Acute respiratory failure, unspecified whether with hypoxia or hypercapnia: Secondary | ICD-10-CM

## 2019-10-20 DIAGNOSIS — F17210 Nicotine dependence, cigarettes, uncomplicated: Secondary | ICD-10-CM | POA: Diagnosis present

## 2019-10-20 DIAGNOSIS — K8681 Exocrine pancreatic insufficiency: Secondary | ICD-10-CM | POA: Diagnosis present

## 2019-10-20 DIAGNOSIS — T783XXD Angioneurotic edema, subsequent encounter: Secondary | ICD-10-CM | POA: Diagnosis not present

## 2019-10-20 DIAGNOSIS — R0689 Other abnormalities of breathing: Secondary | ICD-10-CM | POA: Diagnosis not present

## 2019-10-20 DIAGNOSIS — J90 Pleural effusion, not elsewhere classified: Secondary | ICD-10-CM | POA: Diagnosis not present

## 2019-10-20 LAB — BASIC METABOLIC PANEL
Anion gap: 10 (ref 5–15)
BUN: 9 mg/dL (ref 8–23)
CO2: 23 mmol/L (ref 22–32)
Calcium: 9.1 mg/dL (ref 8.9–10.3)
Chloride: 105 mmol/L (ref 98–111)
Creatinine, Ser: 1.02 mg/dL (ref 0.61–1.24)
GFR calc Af Amer: >60 mL/min (ref >60)
GFR calc non Af Amer: >60 mL/min (ref >60)
Glucose, Bld: 109 mg/dL — ABNORMAL HIGH (ref 70–99)
Potassium: 3.7 mmol/L (ref 3.5–5.1)
Sodium: 138 mmol/L (ref 135–145)

## 2019-10-20 LAB — MAGNESIUM: Magnesium: 1.8 mg/dL (ref 1.7–2.4)

## 2019-10-20 LAB — POCT I-STAT 7, (LYTES, BLD GAS, ICA,H+H)
Acid-Base Excess: 0 mmol/L (ref 0.0–2.0)
Bicarbonate: 25.1 mmol/L (ref 20.0–28.0)
Calcium, Ion: 1.16 mmol/L (ref 1.15–1.40)
HCT: 39 % (ref 39.0–52.0)
Hemoglobin: 13.3 g/dL (ref 13.0–17.0)
O2 Saturation: 100 %
Patient temperature: 98.3
Potassium: 4.1 mmol/L (ref 3.5–5.1)
Sodium: 139 mmol/L (ref 135–145)
TCO2: 26 mmol/L (ref 22–32)
pCO2 arterial: 42.9 mmHg (ref 32.0–48.0)
pH, Arterial: 7.375 (ref 7.350–7.450)
pO2, Arterial: 502 mmHg — ABNORMAL HIGH (ref 83.0–108.0)

## 2019-10-20 LAB — COMPREHENSIVE METABOLIC PANEL
ALT: 16 U/L (ref 0–44)
AST: 17 U/L (ref 15–41)
Albumin: 2.8 g/dL — ABNORMAL LOW (ref 3.5–5.0)
Alkaline Phosphatase: 56 U/L (ref 38–126)
Anion gap: 9 (ref 5–15)
BUN: 7 mg/dL — ABNORMAL LOW (ref 8–23)
CO2: 19 mmol/L — ABNORMAL LOW (ref 22–32)
Calcium: 8.2 mg/dL — ABNORMAL LOW (ref 8.9–10.3)
Chloride: 111 mmol/L (ref 98–111)
Creatinine, Ser: 0.83 mg/dL (ref 0.61–1.24)
GFR calc Af Amer: >60 mL/min (ref >60)
GFR calc non Af Amer: >60 mL/min (ref >60)
Glucose, Bld: 219 mg/dL — ABNORMAL HIGH (ref 70–99)
Potassium: 4.4 mmol/L (ref 3.5–5.1)
Sodium: 139 mmol/L (ref 135–145)
Total Bilirubin: 0.3 mg/dL (ref 0.3–1.2)
Total Protein: 6.4 g/dL — ABNORMAL LOW (ref 6.5–8.1)

## 2019-10-20 LAB — MRSA PCR SCREENING: MRSA by PCR: NEGATIVE

## 2019-10-20 LAB — CBC
HCT: 46.1 % (ref 39.0–52.0)
HCT: 40 % (ref 39.0–52.0)
Hemoglobin: 15.9 g/dL (ref 13.0–17.0)
Hemoglobin: 14.1 g/dL (ref 13.0–17.0)
MCH: 31.3 pg (ref 26.0–34.0)
MCH: 31.6 pg (ref 26.0–34.0)
MCHC: 34.5 g/dL (ref 30.0–36.0)
MCHC: 35.3 g/dL (ref 30.0–36.0)
MCV: 90.7 fL (ref 80.0–100.0)
MCV: 89.7 fL (ref 80.0–100.0)
Platelets: 172 10*3/uL (ref 150–400)
Platelets: 166 10*3/uL (ref 150–400)
RBC: 5.08 MIL/uL (ref 4.22–5.81)
RBC: 4.46 MIL/uL (ref 4.22–5.81)
RDW: 13.1 % (ref 11.5–15.5)
RDW: 13 % (ref 11.5–15.5)
WBC: 3.9 10*3/uL — ABNORMAL LOW (ref 4.0–10.5)
WBC: 3.9 10*3/uL — ABNORMAL LOW (ref 4.0–10.5)
nRBC: 0 % (ref 0.0–0.2)
nRBC: 0 % (ref 0.0–0.2)

## 2019-10-20 LAB — GLUCOSE, CAPILLARY
Glucose-Capillary: 142 mg/dL — ABNORMAL HIGH (ref 70–99)
Glucose-Capillary: 174 mg/dL — ABNORMAL HIGH (ref 70–99)
Glucose-Capillary: 214 mg/dL — ABNORMAL HIGH (ref 70–99)
Glucose-Capillary: 56 mg/dL — ABNORMAL LOW (ref 70–99)
Glucose-Capillary: 60 mg/dL — ABNORMAL LOW (ref 70–99)

## 2019-10-20 LAB — TROPONIN I (HIGH SENSITIVITY): Troponin I (High Sensitivity): 8 ng/L (ref <18)

## 2019-10-20 LAB — PHOSPHORUS: Phosphorus: 2.7 mg/dL (ref 2.5–4.6)

## 2019-10-20 LAB — SARS CORONAVIRUS 2 BY RT PCR (HOSPITAL ORDER, PERFORMED IN ~~LOC~~ HOSPITAL LAB): SARS Coronavirus 2: NEGATIVE

## 2019-10-20 MED ORDER — CARVEDILOL 3.125 MG PO TABS: 3.1250 mg | ORAL_TABLET | Freq: Two times a day (BID) | ORAL | 3 refills | Status: DC

## 2019-10-20 MED ORDER — FENTANYL 2500MCG IN NS 250ML (10MCG/ML) PREMIX INFUSION
0.0000 ug/h | INTRAVENOUS | Status: DC
  Administered 2019-10-20: 20 ug/h via INTRAVENOUS
  Administered 2019-10-20 – 2019-10-22 (×3): 200 ug/h via INTRAVENOUS
  Administered 2019-10-22: 175 ug/h via INTRAVENOUS
  Administered 2019-10-23: 200 ug/h via INTRAVENOUS
  Filled 2019-10-20 (×6): qty 250

## 2019-10-20 MED ORDER — FAMOTIDINE IN NACL 20-0.9 MG/50ML-% IV SOLN: 20.0000 mg | INTRAVENOUS | Status: DC

## 2019-10-20 MED ORDER — CHLORHEXIDINE GLUCONATE 0.12% ORAL RINSE (MEDLINE KIT)
15.0000 mL | Freq: Two times a day (BID) | OROMUCOSAL | Status: DC
  Administered 2019-10-20 – 2019-10-26 (×10): 15 mL via OROMUCOSAL

## 2019-10-20 MED ORDER — PROPOFOL 1000 MG/100ML IV EMUL
INTRAVENOUS | Status: AC
  Filled 2019-10-20: qty 100

## 2019-10-20 MED ORDER — DIPHENHYDRAMINE HCL 50 MG/ML IJ SOLN: 25.0000 mg | Freq: Four times a day (QID) | INTRAMUSCULAR | Status: DC

## 2019-10-20 MED ORDER — FAMOTIDINE IN NACL 20-0.9 MG/50ML-% IV SOLN
20.0000 mg | Freq: Two times a day (BID) | INTRAVENOUS | Status: DC
  Administered 2019-10-20 – 2019-10-23 (×6): 20 mg via INTRAVENOUS
  Filled 2019-10-20 (×6): qty 50

## 2019-10-20 MED ORDER — DEXTROSE 50 % IV SOLN
12.5000 g | INTRAVENOUS | Status: AC
  Administered 2019-10-20: 12.5 g via INTRAVENOUS

## 2019-10-20 MED ORDER — LORAZEPAM 2 MG/ML IJ SOLN
INTRAMUSCULAR | Status: AC
  Administered 2019-10-20: 2 mg via INTRAVENOUS
  Filled 2019-10-20: qty 1

## 2019-10-20 MED ORDER — POTASSIUM CHLORIDE 20 MEQ/15ML (10%) PO SOLN
40.0000 meq | Freq: Once | ORAL | Status: AC
  Administered 2019-10-20: 40 meq
  Filled 2019-10-20: qty 30

## 2019-10-20 MED ORDER — HEPARIN SODIUM (PORCINE) 5000 UNIT/ML IJ SOLN
5000.0000 [IU] | Freq: Three times a day (TID) | INTRAMUSCULAR | Status: DC
  Administered 2019-10-20 – 2019-10-26 (×18): 5000 [IU] via SUBCUTANEOUS
  Filled 2019-10-20 (×18): qty 1

## 2019-10-20 MED ORDER — ROCURONIUM BROMIDE 50 MG/5ML IV SOLN
50.0000 mg | Freq: Once | INTRAVENOUS | Status: AC
  Administered 2019-10-20: 50 mg via INTRAVENOUS
  Filled 2019-10-20: qty 5

## 2019-10-20 MED ORDER — PROPOFOL 1000 MG/100ML IV EMUL
5.0000 ug/kg/min | INTRAVENOUS | Status: DC
  Administered 2019-10-20 – 2019-10-21 (×2): 40 ug/kg/min via INTRAVENOUS
  Administered 2019-10-21: 15 ug/kg/min via INTRAVENOUS
  Administered 2019-10-21: 40 ug/kg/min via INTRAVENOUS
  Administered 2019-10-22: 45 ug/kg/min via INTRAVENOUS
  Administered 2019-10-22: 40 ug/kg/min via INTRAVENOUS
  Administered 2019-10-22: 45 ug/kg/min via INTRAVENOUS
  Administered 2019-10-22 (×2): 40 ug/kg/min via INTRAVENOUS
  Administered 2019-10-22 – 2019-10-23 (×2): 45 ug/kg/min via INTRAVENOUS
  Filled 2019-10-20 (×13): qty 100

## 2019-10-20 MED ORDER — ORAL CARE MOUTH RINSE
15.0000 mL | OROMUCOSAL | Status: DC
  Administered 2019-10-20 – 2019-10-23 (×28): 15 mL via OROMUCOSAL

## 2019-10-20 MED ORDER — DIPHENHYDRAMINE HCL 50 MG/ML IJ SOLN
50.0000 mg | Freq: Four times a day (QID) | INTRAMUSCULAR | Status: DC
  Administered 2019-10-20 – 2019-10-24 (×16): 50 mg via INTRAVENOUS
  Filled 2019-10-20 (×16): qty 1

## 2019-10-20 MED ORDER — MIDAZOLAM HCL 2 MG/2ML IJ SOLN
INTRAMUSCULAR | Status: AC
  Filled 2019-10-20: qty 2

## 2019-10-20 MED ORDER — LACTATED RINGERS IV BOLUS: 1000.0000 mL | Freq: Once | INTRAVENOUS | Status: DC

## 2019-10-20 MED ORDER — POLYETHYLENE GLYCOL 3350 17 G PO PACK
17.0000 g | PACK | Freq: Every day | ORAL | Status: DC | PRN
  Administered 2019-10-22: 17 g via ORAL
  Filled 2019-10-20: qty 1

## 2019-10-20 MED ORDER — DEXTROSE-NACL 5-0.45 % IV SOLN: INTRAVENOUS | Status: DC

## 2019-10-20 MED ORDER — LACTATED RINGERS IV BOLUS
2000.0000 mL | Freq: Once | INTRAVENOUS | Status: AC
  Administered 2019-10-20: 2000 mL via INTRAVENOUS

## 2019-10-20 MED ORDER — PHENYLEPHRINE HCL-NACL 10-0.9 MG/250ML-% IV SOLN
0.0000 ug/min | INTRAVENOUS | Status: DC
  Administered 2019-10-20: 8 ug/min via INTRAVENOUS
  Administered 2019-10-20: 350 ug/min via INTRAVENOUS
  Filled 2019-10-20: qty 250

## 2019-10-20 MED ORDER — PANTOPRAZOLE SODIUM 40 MG PO PACK
40.0000 mg | PACK | Freq: Every day | ORAL | Status: DC
  Filled 2019-10-20: qty 20

## 2019-10-20 MED ORDER — EPINEPHRINE 0.3 MG/0.3ML IJ SOAJ
0.3000 mg | Freq: Once | INTRAMUSCULAR | Status: DC
  Filled 2019-10-20: qty 0.3

## 2019-10-20 MED ORDER — MAGNESIUM SULFATE 2 GM/50ML IV SOLN
2.0000 g | Freq: Once | INTRAVENOUS | Status: AC
  Administered 2019-10-20: 2 g via INTRAVENOUS
  Filled 2019-10-20: qty 50

## 2019-10-20 MED ORDER — INSULIN ASPART 100 UNIT/ML ~~LOC~~ SOLN
0.0000 [IU] | SUBCUTANEOUS | Status: DC
  Administered 2019-10-20: 5 [IU] via SUBCUTANEOUS
  Administered 2019-10-21: 1 [IU] via SUBCUTANEOUS
  Administered 2019-10-21 (×4): 2 [IU] via SUBCUTANEOUS
  Administered 2019-10-21: 3 [IU] via SUBCUTANEOUS
  Administered 2019-10-22: 5 [IU] via SUBCUTANEOUS
  Administered 2019-10-22 (×5): 2 [IU] via SUBCUTANEOUS
  Administered 2019-10-23: 3 [IU] via SUBCUTANEOUS
  Administered 2019-10-23: 2 [IU] via SUBCUTANEOUS

## 2019-10-20 MED ORDER — METHYLPREDNISOLONE SODIUM SUCC 125 MG IJ SOLR
60.0000 mg | Freq: Four times a day (QID) | INTRAMUSCULAR | Status: DC
  Administered 2019-10-20 – 2019-10-24 (×16): 60 mg via INTRAVENOUS
  Filled 2019-10-20 (×17): qty 2

## 2019-10-20 MED ORDER — SUCCINYLCHOLINE CHLORIDE 20 MG/ML IJ SOLN
INTRAMUSCULAR | Status: DC | PRN
  Administered 2019-10-20: 150 mg via INTRAVENOUS

## 2019-10-20 MED ORDER — PHENYLEPHRINE HCL-NACL 10-0.9 MG/250ML-% IV SOLN
INTRAVENOUS | Status: AC
  Filled 2019-10-20: qty 250

## 2019-10-20 MED ORDER — DIPHENHYDRAMINE HCL 50 MG/ML IJ SOLN: 25.0000 mg | Freq: Four times a day (QID) | INTRAMUSCULAR | Status: DC | PRN

## 2019-10-20 MED ORDER — DOCUSATE SODIUM 100 MG PO CAPS: 100.0000 mg | ORAL_CAPSULE | Freq: Two times a day (BID) | ORAL | Status: DC | PRN

## 2019-10-20 MED ORDER — DEXTROSE IN LACTATED RINGERS 5 % IV SOLN: INTRAVENOUS | Status: DC

## 2019-10-20 MED ORDER — ROCURONIUM BROMIDE 10 MG/ML (PF) SYRINGE
PREFILLED_SYRINGE | INTRAVENOUS | Status: AC
  Filled 2019-10-20: qty 10

## 2019-10-20 MED ORDER — DIPHENHYDRAMINE HCL 50 MG/ML IJ SOLN
25.0000 mg | Freq: Once | INTRAMUSCULAR | Status: AC
  Administered 2019-10-20: 11:00:00 25 mg via INTRAVENOUS
  Filled 2019-10-20: qty 1

## 2019-10-20 MED ORDER — DEXTROSE 50 % IV SOLN
INTRAVENOUS | Status: AC
  Administered 2019-10-20: 12.5 mL
  Filled 2019-10-20: qty 50

## 2019-10-20 MED ORDER — ETOMIDATE 2 MG/ML IV SOLN
INTRAVENOUS | Status: DC | PRN
  Administered 2019-10-20: 20 mg via INTRAVENOUS

## 2019-10-20 MED ORDER — MIDAZOLAM HCL 2 MG/2ML IJ SOLN
INTRAMUSCULAR | Status: AC
  Filled 2019-10-20: qty 4

## 2019-10-20 MED ORDER — METHYLPREDNISOLONE SODIUM SUCC 125 MG IJ SOLR
125.0000 mg | Freq: Once | INTRAMUSCULAR | Status: AC
  Administered 2019-10-20: 11:00:00 125 mg via INTRAVENOUS
  Filled 2019-10-20: qty 2

## 2019-10-20 MED ORDER — LORAZEPAM 2 MG/ML IJ SOLN: 2.0000 mg | Freq: Once | INTRAMUSCULAR | Status: AC

## 2019-10-20 NOTE — Progress Notes (Signed)
  Re round   - patien better with hypotension but still needing neo, diprivan and fent gtt. LAbs reviewed below. Exam shows he is stable  Plan  - repeat pm labs     SIGNATURE    Dr. Kalman Shan, M.D., F.C.C.P,  Pulmonary and Critical Care Medicine Staff Physician, [facility] Director - Interstitial Lung Disease  Program  Pulmonary Fibrosis [facility] Network at [facility] [place], [place], [address]  Pager: [phone], If no answer or between  15:00h - 7:00h: call [phone] Telephone: [phone]  4:52 PM 10/20/2019    LABS    PULMONARY Recent Labs  Lab 10/20/19 1351  PHART 7.375  PCO2ART 42.9  PO2ART 502*  HCO3 25.1  TCO2 26  O2SAT 100.0    CBC Recent Labs  Lab 10/18/19 1509 10/20/19 1007 10/20/19 1351  HGB 16.2 15.9 13.3  HCT 47.2 46.1 39.0  WBC 4.0 3.9*  --   PLT 174 172  --     COAGULATION No results for input(s): INR in the last 168 hours.  CARDIAC  No results for input(s): TROPONINI in the last 168 hours. No results for input(s): PROBNP in the last 168 hours.   CHEMISTRY Recent Labs  Lab 10/18/19 1509 10/18/19 1509 10/20/19 1007 10/20/19 1351  NA 138  --  138 139  K 3.7   < > 3.7 4.1  CL 102  --  105  --   CO2 22  --  23  --   GLUCOSE 95  --  109*  --   BUN 6*  --  9  --   CREATININE 0.89  --  1.02  --   CALCIUM 9.5  --  9.1  --    < > = values in this interval not displayed.   Estimated Creatinine Clearance: 77.3 mL/min (by C-G formula based on SCr of 1.02 mg/dL).   LIVER Recent Labs  Lab 10/18/19 1509  AST 22  ALT 18  ALKPHOS 100  BILITOT 0.5  PROT 8.2  ALBUMIN 4.4     INFECTIOUS No results for input(s): LATICACIDVEN, PROCALCITON in the last 168 hours.   ENDOCRINE CBG (last 3)  No results for input(s): GLUCAP in the last 72 hours.       IMAGING x48h  - image(s) personally visualized  -   highlighted in bold DG Chest Port 1 View  Result Date:  10/20/2019 CLINICAL DATA:  Tube placement. EXAM: PORTABLE CHEST 1 VIEW COMPARISON:  None. FINDINGS: 1041 hours. Endotracheal tube tip is approximately 3.4 cm above the base of the carina. The NG tube passes into the stomach although the distal tip position is not included on the film. Asymmetric elevation right hemidiaphragm. The lungs are clear without focal pneumonia, edema, pneumothorax or pleural effusion. Cardiopericardial silhouette is at upper limits of normal for size. The visualized bony structures of the thorax show now acute abnormality. Telemetry leads overlie the chest. IMPRESSION: Endotracheal tube tip is 3.4 cm above the base of the carina. Electronically Signed   By: Kennith Center M.D.   On: 10/20/2019 10:47

## 2019-10-20 NOTE — Patient Instructions (Addendum)
Please go straight to [facility] Emergency Room. Stop Lisinopril. We have replaced with Coreg instead.

## 2019-10-20 NOTE — ED Notes (Signed)
Got patient on the monitor patient is resting with call bell in reach  ?

## 2019-10-20 NOTE — ED Triage Notes (Signed)
Pt took Lisinopril for the first time yesterday and woke up with angioedema this morning.  Denies SOB.  Pt to trauma room.

## 2019-10-20 NOTE — H&P (Addendum)
NAME:  Marco Pittman, MRN:  [id number], DOB:  September 07, 1960, LOS: 0 ADMISSION DATE:  10/20/2019, CONSULTATION DATE:  10/20/19 REFERRING MD:  Hyacinth Meeker  CHIEF COMPLAINT:  Angioedema   Brief History   Marco Pittman is a 59 y.o. male who was admitted 7/15 with angioedema after taking lisinopril for the first time the day prior.  He required intubation in the ED for airway protection.  History of present illness   Pt is encephelopathic; therefore, this HPI is obtained from chart review. Marco Pittman is a 59 y.o. male who has a PMH as outlined below.  He presented to [facility] 7/15 with angioedema after taking lisinopril for the first time the day prior.  He had been seen by PCP and was advised to go to ED.  In ED, he required intubation for airway protection after edema had worsened.  Past Medical History  has Essential hypertension, benign; Chronic pancreatitis (HCC); History of alcohol abuse; Weight loss, unintentional; Pre-diabetes; Chronic left shoulder pain; Primary biliary cirrhosis (HCC); Sessile colonic polyp; and Former smoker on their problem list.  Significant Hospital Events   7/15 > admit.  Consults:  None.  Procedures:  ETT 7/15 >   Significant Diagnostic Tests:  None.  Micro Data:  COVID 7/15 >   Antimicrobials:  None.   Interim history/subjective:  Sedated on vent.  Objective:  Blood pressure 119/82, pulse 94, temperature 98.3 F (36.8 C), temperature source Oral, resp. rate 16, height 5' 9.5" (1.765 m), weight 79.8 kg, SpO2 100 %.    Vent Mode: PRVC FiO2 (%):  [40 %] 40 % Set Rate:  [16 bmp] 16 bmp Vt Set:  [570 mL] 570 mL PEEP:  [5 cmH20] 5 cmH20 Plateau Pressure:  [14 cmH20] 14 cmH20  No intake or output data in the 24 hours ending 10/20/19 1052 Filed Weights   10/20/19 0944  Weight: 79.8 kg    Examination (performed from doorway while awaiting COVID testing): General: Adult male, in NAD. Neuro: Sedated on vent. HEENT: Terry/AT. Sclerae anicteric.   Angioedema noted.  ETT in place. Cardiovascular: RRR, no M/R/G.  Lungs: Respirations even and unlabored.  CTA bilaterally, No W/R/R.  Abdomen: BS x 4, soft, NT/ND.  Musculoskeletal: No gross deformities, no edema.  Skin: Intact, warm, no rashes.  Assessment & Plan:   Angioedema - 2/2 ACEi. - AVOID ACE INHIBITORS FOR LIFE (added to allergy list). - Start solumedrol, pepcid, benadryl.  Respiratory insufficiency requiring intubation - 2/2 above. - Full vent support. - SBT once swelling has improved. - Follow CXR.  Hx HTN. - Hold home amlodipine, carvedilol.  Hx chronic pancreatitis, EtOH abuse. - Supportive care.  Best Practice:  Diet: NPO. Pain/Anxiety/Delirium protocol (if indicated): Propofol gtt / Fentanyl gtt.  RASS goal -1. VAP protocol (if indicated): In place. DVT prophylaxis: SCD's / Heparin. GI prophylaxis: Famotidine. Glucose control: SSI if glucose consistently > 180. Mobility: Bedrest. Code Status: Full. Family Communication: Will call. Disposition: ICU.  Labs   CBC: Recent Labs  Lab 10/18/19 1509 10/20/19 1007  WBC 4.0 3.9*  HGB 16.2 15.9  HCT 47.2 46.1  MCV 94 90.7  PLT 174 172   Basic Metabolic Panel: Recent Labs  Lab 10/18/19 1509  NA 138  K 3.7  CL 102  CO2 22  GLUCOSE 95  BUN 6*  CREATININE 0.89  CALCIUM 9.5   GFR: Estimated Creatinine Clearance: 88.6 mL/min (by C-G formula based on SCr of 0.89 mg/dL). Recent Labs  Lab 10/18/19 1509 10/20/19  1007  WBC 4.0 3.9*   Liver Function Tests: Recent Labs  Lab 10/18/19 1509  AST 22  ALT 18  ALKPHOS 100  BILITOT 0.5  PROT 8.2  ALBUMIN 4.4   No results for input(s): LIPASE, AMYLASE in the last 168 hours. No results for input(s): AMMONIA in the last 168 hours. ABG No results found for: PHART, PCO2ART, PO2ART, HCO3, TCO2, ACIDBASEDEF, O2SAT  Coagulation Profile: No results for input(s): INR, PROTIME in the last 168 hours. Cardiac Enzymes: No results for input(s): CKTOTAL, CKMB,  CKMBINDEX, TROPONINI in the last 168 hours. HbA1C: Hemoglobin A1C  Date/Time Value Ref Range Status  05/05/2018 09:12 AM 5.9 (A) 4.0 - 5.6 % Final  10/10/2016 09:20 AM 6.1  Final   Hgb A1c MFr Bld  Date/Time Value Ref Range Status  10/18/2019 03:09 PM 5.9 (H) 4.8 - 5.6 % Final    Comment:             Prediabetes: 5.7 - 6.4          Diabetes: >6.4          Glycemic control for adults with diabetes: <7.0   03/05/2016 05:43 AM 5.9 (H) 4.8 - 5.6 % Final    Comment:    (NOTE)         Pre-diabetes: 5.7 - 6.4         Diabetes: >6.4         Glycemic control for adults with diabetes: <7.0    CBG: No results for input(s): GLUCAP in the last 168 hours.  Review of Systems:   Unable to obtain as pt is encephalopathic.  Past medical history  He,  has a past medical history of Aneurysm (HCC), ETOH abuse, Hypertension, Pancreatitis (03/04/2016), and Umbilical hernia.   Surgical History    Past Surgical History:  Procedure Laterality Date  . INSERTION OF MESH N/A 04/14/2016   Procedure: INSERTION OF MESH;  Surgeon: Avel Peace, MD;  Location: [facility];  Service: General;  Laterality: N/A;  . UMBILICAL HERNIA REPAIR N/A 04/14/2016   Procedure: UMBILICAL HERNIA REPAIR WITH MESH;  Surgeon: Avel Peace, MD;  Location: [facility];  Service: General;  Laterality: N/A;     Social History   reports that he has been smoking cigars. He has a 22.50 pack-year smoking history. He has never used smokeless tobacco. He reports current alcohol use of about 14.0 standard drinks of alcohol per week. He reports that he does not use drugs.   Family history   His family history includes Cancer (age of onset: 5) in his father.   Allergies Allergies  Allergen Reactions  . Lisinopril Swelling    All      Home meds  Prior to Admission medications   Medication Sig Start Date End Date Taking? Authorizing Provider  amLODipine (NORVASC) 10 MG tablet Take 1 tablet (10  mg total) by mouth daily. 10/18/19   Marcine Matar, MD  carvedilol (COREG) 3.125 MG tablet Take 1 tablet (3.125 mg total) by mouth 2 (two) times daily with a meal. 10/20/19   Marcine Matar, MD  ursodiol (ACTIGALL) 500 MG tablet TAKE 1 TABLET BY MOUTH 3 (THREE) TIMES DAILY. Patient taking differently: Take 500 mg by mouth 3 (three) times daily.  10/18/19   Marcine Matar, MD    Critical care time: 35 min.    Rutherford Guys, [place] [facility] Medicine 10/20/2019, 10:52 AM

## 2019-10-20 NOTE — Progress Notes (Signed)
Pt states

## 2019-10-20 NOTE — ED Notes (Signed)
FRIEND- Grafton Folk -- [phone]

## 2019-10-20 NOTE — Progress Notes (Signed)
Opened in error. Will see another provider at this time.

## 2019-10-20 NOTE — ED Notes (Signed)
Pt intubated- swelling in lips and neck has decreased. Small amount of bright red bleeding from OG-

## 2019-10-20 NOTE — Progress Notes (Addendum)
Patient ID: Marco Pittman, male    DOB: [birth year]  MRN: [id number]  CC: Allergic Reaction   Subjective: Marco Pittman is a 59 y.o. male who presents for UC visit His concerns today include:  Hx of HTN, chronic pancreatitis due to ETOH, tob use, pre-DM, PBC, colon polyps.  Patient presents today complaining of swelling to the left side of his face and problems swallowing and handling his secretions.  He was seen 2 days ago and started on lisinopril.  He took his first dose last night.  He denies any shortness of breath. Patient Active Problem List   Diagnosis Date Noted  . Former smoker 06/16/2017  . 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 Sessile colonic polyp 01/29/2017  . Pre-diabetes 01/27/2017  . Chronic left shoulder pain 01/27/2017  . Weight loss, unintentional 10/10/2016  . History of alcohol abuse 03/19/2016  . Chronic pancreatitis (HCC) 03/04/2016  . Essential hypertension, benign 10/06/2012     Current Outpatient Medications on File Prior to Visit  Medication Sig Dispense Refill  . amLODipine (NORVASC) 10 MG tablet Take 1 tablet (10 mg total) by mouth daily. 90 tablet 1  . ursodiol (ACTIGALL) 500 MG tablet TAKE 1 TABLET BY MOUTH 3 (THREE) TIMES DAILY. 90 tablet 1   No current facility-administered medications on file prior to visit.    Allergies  Allergen Reactions  . Lisinopril Swelling    All     Social History   Socioeconomic History  . Marital status: Single    Spouse name: Not on file  . Number of children: Not on file  . Years of education: Not on file  . Highest education level: Not on file  Occupational History  . Not on file  Tobacco Use  . Smoking status: Current Every Day Smoker    Packs/day: 0.50    Years: 45.00    Pack years: 22.50    Types: Cigars  . Smokeless tobacco: Never Used  Substance and Sexual Activity  . Alcohol use: Yes    Alcohol/week: 14.0 standard drinks    Types: 14 Cans of beer per week    Comment:  last alcohol on 03/29/16  . Drug use: No  . Sexual activity: Yes  Other Topics Concern  . Not on file  Social History Narrative  . Not on file   Social Determinants of Health   Financial Resource Strain:   . Difficulty of Paying Living Expenses:   Food Insecurity:   . Worried About Programme researcher, broadcasting/film/video in the Last Year:   . Barista in the Last Year:   Transportation Needs:   . Freight forwarder (Medical):   Marland Kitchen Lack of Transportation (Non-Medical):   Physical Activity:   . Days of Exercise per Week:   . Minutes of Exercise per Session:   Stress:   . Feeling of Stress :   Social Connections:   . Frequency of Communication with Friends and Family:   . Frequency of Social Gatherings with Friends and Family:   . Attends Religious Services:   . Active Member of Clubs or Organizations:   . Attends Banker Meetings:   Marland Kitchen Marital Status:   Intimate Partner Violence:   . Fear of Current or Ex-Partner:   . Emotionally Abused:   Marland Kitchen Physically Abused:   . Sexually Abused:     Family History  Problem Relation Age of Onset  . Cancer Father 13  colon    Past Surgical History:  Procedure Laterality Date  . INSERTION OF MESH N/A 04/14/2016   Procedure: INSERTION OF MESH;  Surgeon: Avel Peace, MD;  Location: [facility];  Service: General;  Laterality: N/A;  . UMBILICAL HERNIA REPAIR N/A 04/14/2016   Procedure: UMBILICAL HERNIA REPAIR WITH MESH;  Surgeon: Avel Peace, MD;  Location: [facility];  Service: General;  Laterality: N/A;    ROS: Review of Systems Negative except as stated above  PHYSICAL EXAM: BP 119/81   Pulse 72   Resp 16   Wt 173 lb (78.5 kg)   SpO2 98%   BMI 25.18 kg/m   Physical Exam   General appearance - alert, well appearing, and in no distress Mental status - normal mood, behavior, speech, dress, motor activity, and thought processes Mouth -patient with moderate angioedema of the lips  especially on the right side.  Mild edema of the tongue posteriorly. Neck -patient with some angioedema of the neck on the right side.   Chest - clear to auscultation, no wheezes, rales or rhonchi, symmetric air entry Heart - normal rate, regular rhythm, normal S1, S2, no murmurs, rubs, clicks or gallops  CMP Latest Ref Rng & Units 10/18/2019 05/05/2018 04/13/2018  Glucose 65 - 99 mg/dL 95 84 505(W)  BUN 8 - 27 mg/dL 6(L) 9 11  Creatinine 9.79 - 1.27 mg/dL 4.80 1.65 5.37  Sodium 134 - 144 mmol/L 138 144 138  Potassium 3.5 - 5.2 mmol/L 3.7 4.4 3.1(L)  Chloride 96 - 106 mmol/L 102 106 100  CO2 20 - 29 mmol/L 22 19(L) 21  Calcium 8.6 - 10.2 mg/dL 9.5 9.0 9.4  Total Protein 6.0 - 8.5 g/dL 8.2 - 7.6  Total Bilirubin 0.0 - 1.2 mg/dL 0.5 - 1.0  Alkaline Phos 48 - 121 IU/L 100 - 83  AST 0 - 40 IU/L 22 - 19  ALT 0 - 44 IU/L 18 - 13   Lipid Panel     Component Value Date/Time   CHOL 162 10/18/2019 1509   TRIG 77 10/18/2019 1509   HDL 52 10/18/2019 1509   CHOLHDL 3.1 10/18/2019 1509   CHOLHDL 4.2 03/05/2016 0543   VLDL 12 03/05/2016 0543   LDLCALC 95 10/18/2019 1509    CBC    Component Value Date/Time   WBC 4.0 10/18/2019 1509   WBC 4.3 03/05/2016 0543   RBC 5.05 10/18/2019 1509   RBC 4.92 03/05/2016 0543   HGB 16.2 10/18/2019 1509   HCT 47.2 10/18/2019 1509   PLT 174 10/18/2019 1509   MCV 94 10/18/2019 1509   MCH 32.1 10/18/2019 1509   MCH 31.1 03/05/2016 0543   MCHC 34.3 10/18/2019 1509   MCHC 35.6 03/05/2016 0543   RDW 13.5 10/18/2019 1509   LYMPHSABS 1.6 12/21/2012 1420   LYMPHSABS 1.1 10/06/2012 1452   MONOABS 0.5 12/21/2012 1420   EOSABS 0.0 12/21/2012 1420   EOSABS 0.0 10/06/2012 1452   BASOSABS 0.0 12/21/2012 1420   BASOSABS 0.0 10/06/2012 1452    ASSESSMENT AND PLAN:  1. Angioedema of intestine due to angiotensin converting enzyme inhibitor (ACE-I) Advised to stop lisinopril. Given that he has some problems swallowing and handling secretions, I recommended that  he be seen in the emergency room as he will need monitoring for several hours. -I have signed out to triage nurse at [facility]. 2. Essential hypertension Change lisinopril to carvedilol. - carvedilol (COREG) 3.125 MG tablet; Take 1 tablet (3.125 mg total) by mouth  2 (two) times daily with a meal.  Dispense: 60 tablet; Refill: 3  Patient was given the opportunity to ask questions.  Patient verbalized understanding of the plan and was able to repeat key elements of the plan.   No orders of the defined types were placed in this encounter.  ADDENDUM:  12/17/2019:  Primary diagnosis should have been Angioedema Due to ACE-I. Not Angioedema of the intestine due to ACE-I  Correction made.   Requested Prescriptions    No prescriptions requested or ordered in this encounter    No follow-ups on file.  Jonah Blue, MD, FACP

## 2019-10-20 NOTE — ED Provider Notes (Signed)
[facility] EMERGENCY DEPARTMENT Provider Note   CSN: [id number] Arrival date & time: 10/20/19  0901     History Chief Complaint  Patient presents with  . Angioedema    Marco Pittman is a 59 y.o. male.  HPI   This patient is a 59 year old male, known to have hypertension and recently started on lisinopril as of yesterday.  He took his first dose of 10 mg and woke up this morning with swelling on the right side of his face, under his tongue and in the neck as well.  He has some difficulty with speech.  This is persistent, he has not had any difficulty with breathing but has a little bit of difficulty swallowing and states his voice is changed just a little bit.  He denies pain, denies fever, denies any dental injuries or pain.  He does have a history of alcohol abuse and pancreatitis.  The only medicine that he takes is the lisinopril which she took for the first time yesterday.  The medical record shows that the patient was actually started on multiple medications several days ago including amlodipine and carvedilol  Past Medical History:  Diagnosis Date  . Aneurysm ([place])   . ETOH abuse   . Hypertension   . Pancreatitis 03/04/2016  . Umbilical hernia    symptomatic umbilical hernia/notes 71/24/5809    Patient Active Problem List   Diagnosis Date Noted  . Former smoker 06/16/2017  . Primary biliary cirrhosis ([place]) 01/29/2017  . Sessile colonic polyp 01/29/2017  . Pre-diabetes 01/27/2017  . Chronic left shoulder pain 01/27/2017  . Weight loss, unintentional 10/10/2016  . History of alcohol abuse 03/19/2016  . Chronic pancreatitis ([place]) 03/04/2016  . Essential hypertension, benign 10/06/2012    Past Surgical History:  Procedure Laterality Date  . INSERTION OF MESH N/A 04/14/2016   Procedure: INSERTION OF MESH;  Surgeon: Jackolyn Confer, MD;  Location: [facility];  Service: General;  Laterality: N/A;  . UMBILICAL HERNIA REPAIR N/A  04/14/2016   Procedure: UMBILICAL HERNIA REPAIR WITH MESH;  Surgeon: Jackolyn Confer, MD;  Location: [facility];  Service: General;  Laterality: N/A;       Family History  Problem Relation Age of Onset  . Cancer Father 6       colon    Social History   Tobacco Use  . Smoking status: Current Every Day Smoker    Packs/day: 0.50    Years: 45.00    Pack years: 22.50    Types: Cigars  . Smokeless tobacco: Never Used  Substance Use Topics  . Alcohol use: Yes    Alcohol/week: 14.0 standard drinks    Types: 14 Cans of beer per week    Comment: last alcohol on 03/29/16  . Drug use: No    Home Medications Prior to Admission medications   Medication Sig Start Date End Date Taking? Authorizing Provider  amLODipine (NORVASC) 10 MG tablet Take 1 tablet (10 mg total) by mouth daily. 10/18/19   Ladell Pier, MD  carvedilol (COREG) 3.125 MG tablet Take 1 tablet (3.125 mg total) by mouth 2 (two) times daily with a meal. 10/20/19   Ladell Pier, MD  ursodiol (ACTIGALL) 500 MG tablet TAKE 1 TABLET BY MOUTH 3 (THREE) TIMES DAILY. Patient taking differently: Take 500 mg by mouth 3 (three) times daily.  10/18/19   Ladell Pier, MD    Allergies    Lisinopril  Review of Systems  Review of Systems  Constitutional: Negative for chills and fever.  HENT: Positive for facial swelling. Negative for sore throat.   Eyes: Negative for visual disturbance.  Respiratory: Negative for cough and shortness of breath.   Cardiovascular: Negative for chest pain.  Gastrointestinal: Negative for abdominal pain, diarrhea, nausea and vomiting.  Genitourinary: Negative for dysuria and frequency.  Musculoskeletal: Negative for back pain and neck pain.  Skin: Negative for rash.  Neurological: Negative for weakness, numbness and headaches.  Hematological: Negative for adenopathy.  Psychiatric/Behavioral: Negative for behavioral problems.  All other systems reviewed and are  negative.   Physical Exam Updated Vital Signs BP 119/82 (BP Location: Left Arm)   Pulse 74   Temp 98.3 F (36.8 C) (Oral)   Resp 18   Ht 1.765 m (5' 9.5")   Wt 79.8 kg   SpO2 98%   BMI 25.62 kg/m   Physical Exam Vitals and nursing note reviewed.  Constitutional:      General: He is not in acute distress.    Appearance: He is well-developed.  HENT:     Head: Normocephalic and atraumatic.     Comments: Facial swelling in the the lips R>L, tongue, and the submental space - no ttp around the teeth and no LAd of hte neck, no trismus or torticollis    Mouth/Throat:     Pharynx: No oropharyngeal exudate.  Eyes:     General: No scleral icterus.       Right eye: No discharge.        Left eye: No discharge.     Conjunctiva/sclera: Conjunctivae normal.     Pupils: Pupils are equal, round, and reactive to light.  Neck:     Thyroid: No thyromegaly.     Vascular: No JVD.  Cardiovascular:     Rate and Rhythm: Normal rate and regular rhythm.     Heart sounds: Normal heart sounds. No murmur heard.  No friction rub. No gallop.   Pulmonary:     Effort: Pulmonary effort is normal. No respiratory distress.     Breath sounds: Normal breath sounds. No wheezing or rales.  Abdominal:     General: Bowel sounds are normal. There is no distension.     Palpations: Abdomen is soft. There is no mass.     Tenderness: There is no abdominal tenderness.  Musculoskeletal:        General: No tenderness. Normal range of motion.     Cervical back: Normal range of motion and neck supple.  Lymphadenopathy:     Cervical: No cervical adenopathy.  Skin:    General: Skin is warm and dry.     Findings: No erythema or rash.  Neurological:     Mental Status: He is alert.     Coordination: Coordination normal.  Psychiatric:        Behavior: Behavior normal.     ED Results / Procedures / Treatments   Labs (all labs ordered are listed, but only abnormal results are displayed) Labs Reviewed  CBC -  Abnormal; Notable for the following components:      Result Value   WBC 3.9 (*)    All other components within normal limits  BASIC METABOLIC PANEL    EKG None  Radiology DG Chest Port 1 View  Result Date: 10/20/2019 CLINICAL DATA:  Tube placement. EXAM: PORTABLE CHEST 1 VIEW COMPARISON:  None. FINDINGS: 1041 hours. Endotracheal tube tip is approximately 3.4 cm above the base of the carina. The NG tube passes  into the stomach although the distal tip position is not included on the film. Asymmetric elevation right hemidiaphragm. The lungs are clear without focal pneumonia, edema, pneumothorax or pleural effusion. Cardiopericardial silhouette is at upper limits of normal for size. The visualized bony structures of the thorax show now acute abnormality. Telemetry leads overlie the chest. IMPRESSION: Endotracheal tube tip is 3.4 cm above the base of the carina. Electronically Signed   By: Misty Stanley M.D.   On: 10/20/2019 10:47    Procedures .Critical Care Performed by: Noemi Chapel, MD Authorized by: Noemi Chapel, MD   Critical care provider statement:    Critical care time (minutes):  35   Critical care time was exclusive of:  Separately billable procedures and treating other patients and teaching time   Critical care was necessary to treat or prevent imminent or life-threatening deterioration of the following conditions:  Respiratory failure   Critical care was time spent personally by me on the following activities:  Blood draw for specimens, development of treatment plan with patient or surrogate, discussions with consultants, evaluation of patient's response to treatment, examination of patient, obtaining history from patient or surrogate, ordering and performing treatments and interventions, ordering and review of laboratory studies, ordering and review of radiographic studies, pulse oximetry, re-evaluation of patient's condition and review of old charts Comments:       Procedure  Name: Intubation Date/Time: 10/20/2019 10:27 AM Performed by: Noemi Chapel, MD Pre-anesthesia Checklist: Patient identified, Patient being monitored, Emergency Drugs available, Timeout performed and Suction available Oxygen Delivery Method: Non-rebreather mask Preoxygenation: Pre-oxygenation with 100% oxygen Induction Type: Rapid sequence Ventilation: Mask ventilation without difficulty Laryngoscope Size: Mac and 4 Grade View: Grade IV Tube size: 7.5 mm Airway Equipment and Method: Stylet (Direct laryngoscopy) Placement Confirmation: ETT inserted through vocal cords under direct vision,  CO2 detector and Breath sounds checked- equal and bilateral Secured at: 25 cm Tube secured with: ETT holder Dental Injury: Teeth and Oropharynx as per pre-operative assessment  Difficulty Due To: Difficulty was anticipated Comments: Angioedema       (including critical care time)  Medications Ordered in ED Medications - No data to display  ED Course  I have reviewed the triage vital signs and the nursing notes.  Pertinent labs & imaging results that were available during my care of the patient were reviewed by me and considered in my medical decision making (see chart for details).    MDM Rules/Calculators/A&P                          The patient clearly has angioedema, I do not think this is odontogenic in nature, he will need to be observed in the emergency department, he may need definitive airway if this gets any worse.  Cardiac monitoring, labs, vital signs thankfully look normal at this time.  Within 20 minutes the patient was reexamined and had increased swelling around the tongue with a slight worsening change in his voice.  It was decided that at this time that the patient was likely going to have difficulty with airway and emergent intubation was performed.  Please see separate notes.  Labs EKG pending, chest x-ray ordered to confirm tube placement however auscultation and  bilateral chest rise was witnessed with oxygen of 100% post intubation.  Intensive care unit paged for admission Pt is requiring Propofol drip, Fentanyl drip, Ativan, Ventilator  D/w ICU attending - they will come to admit  Final Clinical Impression(s) /  ED Diagnoses Final diagnoses:  Angioedema, initial encounter  Acute respiratory failure, unspecified whether with hypoxia or hypercapnia (HCC)      Noemi Chapel, MD 10/20/19 1057

## 2019-10-21 ENCOUNTER — Inpatient Hospital Stay (HOSPITAL_COMMUNITY): Payer: Medicaid Other

## 2019-10-21 DIAGNOSIS — R0689 Other abnormalities of breathing: Secondary | ICD-10-CM

## 2019-10-21 LAB — BASIC METABOLIC PANEL
Anion gap: 8 (ref 5–15)
BUN: 7 mg/dL — ABNORMAL LOW (ref 8–23)
CO2: 23 mmol/L (ref 22–32)
Calcium: 8.2 mg/dL — ABNORMAL LOW (ref 8.9–10.3)
Chloride: 109 mmol/L (ref 98–111)
Creatinine, Ser: 1.06 mg/dL (ref 0.61–1.24)
GFR calc Af Amer: >60 mL/min (ref >60)
GFR calc non Af Amer: >60 mL/min (ref >60)
Glucose, Bld: 140 mg/dL — ABNORMAL HIGH (ref 70–99)
Potassium: 4.3 mmol/L (ref 3.5–5.1)
Sodium: 140 mmol/L (ref 135–145)

## 2019-10-21 LAB — GLUCOSE, CAPILLARY
Glucose-Capillary: 137 mg/dL — ABNORMAL HIGH (ref 70–99)
Glucose-Capillary: 136 mg/dL — ABNORMAL HIGH (ref 70–99)
Glucose-Capillary: 137 mg/dL — ABNORMAL HIGH (ref 70–99)
Glucose-Capillary: 131 mg/dL — ABNORMAL HIGH (ref 70–99)
Glucose-Capillary: 152 mg/dL — ABNORMAL HIGH (ref 70–99)

## 2019-10-21 LAB — CBC
HCT: 39.4 % (ref 39.0–52.0)
Hemoglobin: 13.4 g/dL (ref 13.0–17.0)
MCH: 31.8 pg (ref 26.0–34.0)
MCHC: 34 g/dL (ref 30.0–36.0)
MCV: 93.6 fL (ref 80.0–100.0)
Platelets: 171 10*3/uL (ref 150–400)
RBC: 4.21 MIL/uL — ABNORMAL LOW (ref 4.22–5.81)
RDW: 13.5 % (ref 11.5–15.5)
WBC: 2.4 10*3/uL — ABNORMAL LOW (ref 4.0–10.5)
nRBC: 0 % (ref 0.0–0.2)

## 2019-10-21 LAB — MAGNESIUM: Magnesium: 2.4 mg/dL (ref 1.7–2.4)

## 2019-10-21 LAB — ECHOCARDIOGRAM COMPLETE
Area-P 1/2: 2.07 cm2
Height: 69.5 in
S' Lateral: 2.8 cm
Weight: 2924.18 oz

## 2019-10-21 LAB — PHOSPHORUS: Phosphorus: 3.6 mg/dL (ref 2.5–4.6)

## 2019-10-21 LAB — LACTIC ACID, PLASMA: Lactic Acid, Venous: 2 mmol/L (ref 0.5–1.9)

## 2019-10-21 MED ORDER — QUETIAPINE FUMARATE 50 MG PO TABS
25.0000 mg | ORAL_TABLET | Freq: Two times a day (BID) | ORAL | Status: DC
  Administered 2019-10-21 – 2019-10-26 (×10): 25 mg
  Filled 2019-10-21 (×10): qty 1

## 2019-10-21 MED ORDER — VITAL AF 1.2 CAL PO LIQD
1000.0000 mL | ORAL | Status: DC
  Administered 2019-10-21 – 2019-10-23 (×3): 1000 mL

## 2019-10-21 MED ORDER — FENTANYL CITRATE (PF) 100 MCG/2ML IJ SOLN
50.0000 ug | Freq: Once | INTRAMUSCULAR | Status: AC
  Administered 2019-10-21: 50 ug via INTRAVENOUS

## 2019-10-21 MED ORDER — PROSOURCE TF PO LIQD
45.0000 mL | Freq: Three times a day (TID) | ORAL | Status: DC
  Administered 2019-10-21 – 2019-10-23 (×6): 45 mL
  Filled 2019-10-21 (×8): qty 45

## 2019-10-21 MED ORDER — CHLORHEXIDINE GLUCONATE CLOTH 2 % EX PADS
6.0000 | MEDICATED_PAD | Freq: Every day | CUTANEOUS | Status: DC
  Administered 2019-10-21 – 2019-10-26 (×6): 6 via TOPICAL

## 2019-10-21 MED ORDER — QUETIAPINE FUMARATE 25 MG PO TABS
25.0000 mg | ORAL_TABLET | Freq: Two times a day (BID) | ORAL | Status: DC
  Administered 2019-10-21: 25 mg via ORAL
  Filled 2019-10-21: qty 1

## 2019-10-21 NOTE — Progress Notes (Addendum)
NAME:  Marco Pittman, MRN:  [id number], DOB:  [birth year], LOS: 1 ADMISSION DATE:  10/20/2019, CONSULTATION DATE:  10/20/19 REFERRING MD:  Hyacinth Meeker  CHIEF COMPLAINT:  Angioedema   Brief History   Marco Pittman is a 59 y.o. male who was admitted 7/15 with angioedema after taking lisinopril for the first time the day prior.  He required intubation in the ED for airway protection.  History of present illness   Pt is encephelopathic; therefore, this HPI is obtained from chart review. Marco Pittman is a 59 y.o. male who has a PMH as outlined below.  He presented to [facility] 7/15 with angioedema after taking lisinopril for the first time the day prior.  He had been seen by PCP and was advised to go to ED.  In ED, he required intubation for airway protection after edema had worsened.  Past Medical History  has Essential hypertension, benign; Chronic pancreatitis (HCC); History of alcohol abuse; Weight loss, unintentional; Pre-diabetes; Chronic left shoulder pain; Primary biliary cirrhosis (HCC); Sessile colonic polyp; Former smoker; Angio-edema; and Acute respiratory failure (HCC) on their problem list.  Significant Hospital Events   7/15 > admit.  Consults:  None.  Procedures:  ETT 7/15 >   Significant Diagnostic Tests:  None.  Micro Data:  COVID 7/15 >   Antimicrobials:  None.   Interim history/subjective:  Sedated on vent.  Objective:  Blood pressure 114/81, pulse (!) 51, temperature 98.7 F (37.1 C), temperature source Axillary, resp. rate 16, height 5' 9.5" (1.765 m), weight 82.9 kg, SpO2 100 %.    Vent Mode: PRVC FiO2 (%):  [40 %-50 %] 40 % Set Rate:  [16 bmp] 16 bmp Vt Set:  [570 mL] 570 mL PEEP:  [5 cmH20] 5 cmH20 Plateau Pressure:  [14 cmH20-18 cmH20] 18 cmH20   Intake/Output Summary (Last 24 hours) at 10/21/2019 0912 Last data filed at 10/21/2019 0800 Gross per 24 hour  Intake 1180.27 ml  Output 1900 ml  Net -719.73 ml   Filed Weights   10/20/19 0944  10/20/19 1300 10/21/19 0500  Weight: 79.8 kg 75.6 kg 82.9 kg    General: critically ill appearing male Neuro: Sedated on vent. HEENT: severe angioedema. ETT Cardiovascular: bradycardic rate, regular rhythm. No lower extremity edema. Extremities warm Lungs: On PRVC PEEP 5, FiO2 40%. Lung sounds clear Abdomen: mildly distended. Soft. bs hypoactive Musculoskeletal: No gross deformities, no edema.  Skin: Intact, warm, no rashes.  Assessment & Plan:   Acute respiratory failure requiring mechanical ventilation due to Angioedema 2/2 ACEi. Edema remains severe this morning. On minimal vent settings PEEP 5, FiO2 40% Plan --continue MV. Check for cuff leak. VAP bundle. Hold off on SBT until angioedema improves --continue solumedrol, pepcid, benadryl  Critical illness requiring sedation. Agitated overnight>>improved with increase in fentanyl. Currently on propofol , fentanyl gtt Bradycardia likely 2/2 sedation. Reportedly did not tolerate fentanyl alone. Plan: continue sedation as above. RAAS goal -1 to -2. Monitor HR closely.  Hx HTN. Blood pressures stable at this time. Blood pressures will likely go up once sedation is down but continue to hold home antihypertensives for now.   Hx chronic pancreatitis, EtOH abuse. - Supportive care.  High risk malnutrition. Start TF today  Best Practice:  Diet: NPO. Pain/Anxiety/Delirium protocol (if indicated): Propofol gtt / Fentanyl gtt.  RASS goal -1. VAP protocol (if indicated): In place. DVT prophylaxis: SCD's / Heparin. GI prophylaxis: Famotidine. Glucose control: SSI if glucose consistently > 180. Mobility: Bedrest. Code Status: Full.  Family Communication: Will update Disposition: ICU.  Labs   CBC: Recent Labs  Lab 10/18/19 1509 10/20/19 1007 10/20/19 1351 10/20/19 1710 10/21/19 0538  WBC 4.0 3.9*  --  3.9* 2.4*  HGB 16.2 15.9 13.3 14.1 13.4  HCT 47.2 46.1 39.0 40.0 39.4  MCV 94 90.7  --  89.7 93.6  PLT 174 172   --  166 171   Basic Metabolic Panel: Recent Labs  Lab 10/18/19 1509 10/20/19 1007 10/20/19 1351 10/20/19 1710 10/21/19 0538  NA 138 138 139 139 140  K 3.7 3.7 4.1 4.4 4.3  CL 102 105  --  111 109  CO2 22 23  --  19* 23  GLUCOSE 95 109*  --  219* 140*  BUN 6* 9  --  7* 7*  CREATININE 0.89 1.02  --  0.83 1.06  CALCIUM 9.5 9.1  --  8.2* 8.2*  MG  --   --   --  1.8 2.4  PHOS  --   --   --  2.7 3.6   GFR: Estimated Creatinine Clearance: 74.4 mL/min (by C-G formula based on SCr of 1.06 mg/dL). Recent Labs  Lab 10/18/19 1509 10/20/19 1007 10/20/19 1710 10/21/19 0538  WBC 4.0 3.9* 3.9* 2.4*  LATICACIDVEN  --   --   --  2.0*   Liver Function Tests: Recent Labs  Lab 10/18/19 1509 10/20/19 1710  AST 22 17  ALT 18 16  ALKPHOS 100 56  BILITOT 0.5 0.3  PROT 8.2 6.4*  ALBUMIN 4.4 2.8*   No results for input(s): LIPASE, AMYLASE in the last 168 hours. No results for input(s): AMMONIA in the last 168 hours. ABG    Component Value Date/Time   PHART 7.375 10/20/2019 1351   PCO2ART 42.9 10/20/2019 1351   PO2ART 502 (H) 10/20/2019 1351   HCO3 25.1 10/20/2019 1351   TCO2 26 10/20/2019 1351   O2SAT 100.0 10/20/2019 1351    Coagulation Profile: No results for input(s): INR, PROTIME in the last 168 hours. Cardiac Enzymes: No results for input(s): CKTOTAL, CKMB, CKMBINDEX, TROPONINI in the last 168 hours. HbA1C: Hemoglobin A1C  Date/Time Value Ref Range Status  05/05/2018 09:12 AM 5.9 (A) 4.0 - 5.6 % Final  10/10/2016 09:20 AM 6.1  Final   Hgb A1c MFr Bld  Date/Time Value Ref Range Status  10/18/2019 03:09 PM 5.9 (H) 4.8 - 5.6 % Final    Comment:             Prediabetes: 5.7 - 6.4          Diabetes: >6.4          Glycemic control for adults with diabetes: <7.0   03/05/2016 05:43 AM 5.9 (H) 4.8 - 5.6 % Final    Comment:    (NOTE)         Pre-diabetes: 5.7 - 6.4         Diabetes: >6.4         Glycemic control for adults with diabetes: <7.0    CBG: Recent Labs    Lab 10/20/19 1807 10/20/19 1946 10/20/19 2341 10/21/19 0401 10/21/19 0806  GLUCAP 174* 214* 142* 137* 136*    Tiernan Suto Ephriam Knuckles, MD Internal Medicine Resident PGY 2 10/21/19 9:30 AM

## 2019-10-21 NOTE — Progress Notes (Signed)
Initial Nutrition Assessment  DOCUMENTATION CODES:   Non-severe (moderate) malnutrition in context of chronic illness  INTERVENTION:   Consider Cortrak if edema slow to improve  Tube feeding:  -Vital AF 1.2 @ 50 ml/hr via OG (1200 ml) -45 ml ProSource TID  Provides: 1560 kcals (1813 kcal with propofol), 123 grams protein, 973 ml free water.   NUTRITION DIAGNOSIS:   Moderate Malnutrition related to chronic illness (pancreatitis/cirrhosis) as evidenced by mild fat depletion, severe muscle depletion.  GOAL:   Patient will meet greater than or equal to 90% of their needs  MONITOR:   Vent status, Skin, TF tolerance, Weight trends, Labs, I & O's  REASON FOR ASSESSMENT:   Consult, Ventilator Enteral/tube feeding initiation and management  ASSESSMENT:   Patient with PMH significant for chronic pancreatitis, ETOH abuse, primary biliary cirrhosis, and essential HTN. Presents this admission with angioedema.   Lips and neck remain swollen. Requiring low dose propofol. Okay to start feeding per CCM. Consider Corrak if edema slow to improve. Xray confirmed OG in stomach.   Weight shows to fluctuate from 78.5 kg to 82.9 kg over the last year. Utilize 75.6 kg as EDW for now. Suspect fluid accumulation is masking losses.   Patient is currently intubated on ventilator support MV: 8.9 L/min Temp (24hrs), Avg:98.5 F (36.9 C), Min:97.8 F (36.6 C), Max:98.8 F (37.1 C)  Propofol: 9.6 ml/hr- provides 253 kcal from lipids daily   I/O: -465 ml since admit  UOP: 1,900 ml x 24 hrs   Drips: D5 with 1/2NS @ 100 ml/hr, propofol Medications: SS novolog, solumedrol Labs: CBG 136-214  NUTRITION - FOCUSED PHYSICAL EXAM:    Most Recent Value  Orbital Region Mild depletion  Upper Arm Region Mild depletion  Thoracic and Lumbar Region Unable to assess  Buccal Region Unable to assess  Temple Region Moderate depletion  Clavicle Bone Region Moderate depletion  Clavicle and Acromion Bone  Region Severe depletion  Scapular Bone Region Unable to assess  Dorsal Hand Unable to assess  Patellar Region Severe depletion  Anterior Thigh Region Severe depletion  Posterior Calf Region Severe depletion  Edema (RD Assessment) Moderate  Hair Reviewed  Eyes Reviewed  Mouth Reviewed  Skin Reviewed  Nails Reviewed     Diet Order:   Diet Order    None      EDUCATION NEEDS:   Not appropriate for education at this time  Skin:  Skin Assessment: Reviewed RN Assessment  Last BM:  PTA  Height:   Ht Readings from Last 1 Encounters:  10/20/19 5' 9.5" (1.765 m)    Weight:   Wt Readings from Last 1 Encounters:  10/21/19 82.9 kg    BMI:  Body mass index is 26.6 kg/m.  Estimated Nutritional Needs:   Kcal:  1723 kcal  Protein:  115-130 grams  Fluid:  >/= 1.7 L/day   Vanessa Kick RD, LDN Clinical Nutrition Pager listed in [facility]ION

## 2019-10-21 NOTE — Progress Notes (Signed)
*  PRELIMINARY RESULTS* Echocardiogram 2D Echocardiogram has been performed.  Marco Pittman 10/21/2019, 10:34 AM

## 2019-10-21 NOTE — Progress Notes (Signed)
RT checked patient for cuff leak. No cuff leak noted. RT will continue to monitor.

## 2019-10-21 NOTE — Progress Notes (Signed)
eLink Physician-Brief Progress Note Patient Name: Marco Pittman DOB: 16-Aug-1960 MRN: [id number]   Date of Service  10/21/2019  HPI/Events of Note  Patient on Fentanyl and Propofol with episodes of bradycardia, however blood pressure remains acceptable  eICU Interventions  Wean Propofol as tolerated.        [name] [name] 10/21/2019, 2:13 AM

## 2019-10-21 NOTE — Progress Notes (Signed)
eLink Physician-Brief Progress Note Patient Name: Marco Pittman DOB: [birth year] MRN: [id number]   Date of Service  10/21/2019  HPI/Events of Note  Agitation on the ventilator despite Fentanyl and Propofol.  eICU Interventions  Will add oral seroquel  PRN.        [name] 10/21/2019, 5:07 AM

## 2019-10-22 DIAGNOSIS — E440 Moderate protein-calorie malnutrition: Secondary | ICD-10-CM | POA: Diagnosis present

## 2019-10-22 LAB — GLUCOSE, CAPILLARY
Glucose-Capillary: 130 mg/dL — ABNORMAL HIGH (ref 70–99)
Glucose-Capillary: 130 mg/dL — ABNORMAL HIGH (ref 70–99)
Glucose-Capillary: 140 mg/dL — ABNORMAL HIGH (ref 70–99)
Glucose-Capillary: 142 mg/dL — ABNORMAL HIGH (ref 70–99)
Glucose-Capillary: 144 mg/dL — ABNORMAL HIGH (ref 70–99)
Glucose-Capillary: 147 mg/dL — ABNORMAL HIGH (ref 70–99)
Glucose-Capillary: 213 mg/dL — ABNORMAL HIGH (ref 70–99)

## 2019-10-22 MED ORDER — SODIUM CHLORIDE 0.9 % IV SOLN
INTRAVENOUS | Status: DC | PRN
  Administered 2019-10-22: 500 mL via INTRAVENOUS

## 2019-10-22 NOTE — Progress Notes (Signed)
NAME:  Marco Pittman, MRN:  [id number], DOB:  03/27/1961, LOS: 2 ADMISSION DATE:  10/20/2019, CONSULTATION DATE:  10/20/2019 REFERRING MD:  Hyacinth Meeker, CHIEF COMPLAINT:  Angioedema with upper airway involvement   Brief History   This is a 59 y.o. male who was admitted 7/15 with angioedema after taking lisinopril for the first time the day prior.  He required intubation in the ED for airway protection.   History of present illness   Marco Pittman is a 59 y.o. male who has a PMH as outlined below.  He presented to [facility] 7/15 with angioedema after taking lisinopril for the first time the day prior.  He had been seen by PCP and was advised to go to ED.  In ED, he required intubation for airway protection after edema had worsened.  Past Medical History  Essential hypertension, benign; Chronic pancreatitis (HCC); History of alcohol abuse; Weight loss, unintentional; Pre-diabetes; Chronic left shoulder pain; Primary biliary cirrhosis (HCC); Sessile colonic polyp; Former smoker; Angio-edema; and Acute respiratory failure (HCC)   Significant Hospital Events   Intubated in ED for airway protection  Consults:  None  Procedures:  ETT 7/15  Significant Diagnostic Tests:  None  Micro Data:   SARS Coronavirus 2  NEGATIVE    Antimicrobials:  None   Interim history/subjective:  Remains sedated on vent  Objective   Blood pressure 126/80, pulse (!) 48, temperature 97.6 F (36.4 C), temperature source Oral, resp. rate 16, height 5' 9.5" (1.765 m), weight 82.6 kg, SpO2 100 %.    Vent Mode: PRVC FiO2 (%):  [40 %] 40 % Set Rate:  [16 bmp-18 bmp] 16 bmp Vt Set:  [570 mL] 570 mL PEEP:  [5 cmH20] 5 cmH20 Plateau Pressure:  [15 cmH20-18 cmH20] 16 cmH20   Intake/Output Summary (Last 24 hours) at 10/22/2019 1134 Last data filed at 10/22/2019 1028 Gross per 24 hour  Intake 4190.31 ml  Output 2530 ml  Net 1660.31 ml   Filed Weights   10/20/19 1300 10/21/19 0500 10/22/19 0405  Weight: 75.6 kg  82.9 kg 82.6 kg    Examination: General: NARD but sedated HENT: Obvious edema in face; reportedly less c/w yesterday Lungs: Clear Cardiovascular: RRR; no m/r/g Abdomen: Supple without guarding Extremities: No C/C/E Neuro: No focal deficits  Assessment & Plan:  1) Acute respiratory failure requiring mechanical ventilation due to Angioedema 2/2 ACEi. Edema less severe this morning. On minimal vent settings PEEP 5, FiO2 40% Plan --continue MV. Check for cuff leak. VAP bundle. Hold off on SBT until angioedema resolves --continue solumedrol, pepcid, benadryl 2) Non-severe (moderate) malnutrition in context of chronic illness Patient started on TF  Best practice:  Diet: Vital AF TF Pain/Anxiety/Delirium protocol (if indicated): propofol/fentanyl VAP protocol (if indicated): In place DVT prophylaxis: SCDs/heparin GI prophylaxis: Famotidine Glucose control: SSi prn Mobility: BR Code Status: Full Family Communication: Will update Disposition:   Labs   CBC: Recent Labs  Lab 10/18/19 1509 10/20/19 1007 10/20/19 1351 10/20/19 1710 10/21/19 0538  WBC 4.0 3.9*  --  3.9* 2.4*  HGB 16.2 15.9 13.3 14.1 13.4  HCT 47.2 46.1 39.0 40.0 39.4  MCV 94 90.7  --  89.7 93.6  PLT 174 172  --  166 171    Basic Metabolic Panel: Recent Labs  Lab 10/18/19 1509 10/20/19 1007 10/20/19 1351 10/20/19 1710 10/21/19 0538  NA 138 138 139 139 140  K 3.7 3.7 4.1 4.4 4.3  CL 102 105  --  111 109  CO2  22 23  --  19* 23  GLUCOSE 95 109*  --  219* 140*  BUN 6* 9  --  7* 7*  CREATININE 0.89 1.02  --  0.83 1.06  CALCIUM 9.5 9.1  --  8.2* 8.2*  MG  --   --   --  1.8 2.4  PHOS  --   --   --  2.7 3.6   GFR: Estimated Creatinine Clearance: 74.4 mL/min (by C-G formula based on SCr of 1.06 mg/dL). Recent Labs  Lab 10/18/19 1509 10/20/19 1007 10/20/19 1710 10/21/19 0538  WBC 4.0 3.9* 3.9* 2.4*  LATICACIDVEN  --   --   --  2.0*    Liver Function Tests: Recent Labs  Lab 10/18/19 1509  10/20/19 1710  AST 22 17  ALT 18 16  ALKPHOS 100 56  BILITOT 0.5 0.3  PROT 8.2 6.4*  ALBUMIN 4.4 2.8*   No results for input(s): LIPASE, AMYLASE in the last 168 hours. No results for input(s): AMMONIA in the last 168 hours.  ABG    Component Value Date/Time   PHART 7.375 10/20/2019 1351   PCO2ART 42.9 10/20/2019 1351   PO2ART 502 (H) 10/20/2019 1351   HCO3 25.1 10/20/2019 1351   TCO2 26 10/20/2019 1351   O2SAT 100.0 10/20/2019 1351     Coagulation Profile: No results for input(s): INR, PROTIME in the last 168 hours.  Cardiac Enzymes: No results for input(s): CKTOTAL, CKMB, CKMBINDEX, TROPONINI in the last 168 hours.  HbA1C: Hemoglobin A1C  Date/Time Value Ref Range Status  05/05/2018 09:12 AM 5.9 (A) 4.0 - 5.6 % Final  10/10/2016 09:20 AM 6.1  Final   Hgb A1c MFr Bld  Date/Time Value Ref Range Status  10/18/2019 03:09 PM 5.9 (H) 4.8 - 5.6 % Final    Comment:             Prediabetes: 5.7 - 6.4          Diabetes: >6.4          Glycemic control for adults with diabetes: <7.0   03/05/2016 05:43 AM 5.9 (H) 4.8 - 5.6 % Final    Comment:    (NOTE)         Pre-diabetes: 5.7 - 6.4         Diabetes: >6.4         Glycemic control for adults with diabetes: <7.0     CBG: Recent Labs  Lab 10/21/19 1636 10/21/19 1941 10/22/19 0008 10/22/19 0342 10/22/19 0803  GLUCAP 131* 152* 140* 147* 213*    Review of Systems:   Unable to obtain.  Past Medical History  He,  has a past medical history of Aneurysm (HCC), ETOH abuse, Hypertension, Pancreatitis (03/04/2016), and Umbilical hernia.   Surgical History    Past Surgical History:  Procedure Laterality Date  . INSERTION OF MESH N/A 04/14/2016   Procedure: INSERTION OF MESH;  Surgeon: Avel Peace, MD;  Location: [facility];  Service: General;  Laterality: N/A;  . UMBILICAL HERNIA REPAIR N/A 04/14/2016   Procedure: UMBILICAL HERNIA REPAIR WITH MESH;  Surgeon: Avel Peace, MD;  Location:   SURGERY CENTER;  Service: General;  Laterality: N/A;     Social History   reports that he has been smoking cigars. He has a 22.50 pack-year smoking history. He has never used smokeless tobacco. He reports current alcohol use of about 14.0 standard drinks of alcohol per week. He reports that he does not use drugs.  Family History   His family history includes Cancer (age of onset: 41) in his father.   Allergies Allergies  Allergen Reactions  . Lisinopril Swelling    Angioedema     Home Medications  Prior to Admission medications   Medication Sig Start Date End Date Taking? Authorizing Provider  amLODipine (NORVASC) 10 MG tablet Take 1 tablet (10 mg total) by mouth daily. 10/18/19  Yes Marcine Matar, MD  ursodiol (ACTIGALL) 500 MG tablet TAKE 1 TABLET BY MOUTH 3 (THREE) TIMES DAILY. Patient taking differently: Take 500 mg by mouth 3 (three) times daily.  10/18/19  Yes Marcine Matar, MD  carvedilol (COREG) 3.125 MG tablet Take 1 tablet (3.125 mg total) by mouth 2 (two) times daily with a meal. 10/20/19   Marcine Matar, MD  lisinopril (ZESTRIL) 10 MG tablet Take 10 mg by mouth daily.    [provider]     Critical care time: 40 min  Netty Starring, M.D.

## 2019-10-22 NOTE — Progress Notes (Signed)
Noted increasing blood pressure, notified Elink, no new orders at this time. Patient resting at this time, no grimacing, synchronous with ventilator.

## 2019-10-23 LAB — GLUCOSE, CAPILLARY
Glucose-Capillary: 192 mg/dL — ABNORMAL HIGH (ref 70–99)
Glucose-Capillary: 119 mg/dL — ABNORMAL HIGH (ref 70–99)
Glucose-Capillary: 111 mg/dL — ABNORMAL HIGH (ref 70–99)
Glucose-Capillary: 120 mg/dL — ABNORMAL HIGH (ref 70–99)

## 2019-10-23 MED ORDER — AMLODIPINE BESYLATE 10 MG PO TABS
10.0000 mg | ORAL_TABLET | Freq: Every day | ORAL | Status: DC
  Administered 2019-10-23 – 2019-10-26 (×4): 10 mg via ORAL
  Filled 2019-10-23 (×4): qty 1

## 2019-10-23 MED ORDER — FAMOTIDINE 20 MG PO TABS
20.0000 mg | ORAL_TABLET | Freq: Two times a day (BID) | ORAL | Status: DC
  Administered 2019-10-23 – 2019-10-24 (×2): 20 mg via ORAL
  Filled 2019-10-23 (×2): qty 1

## 2019-10-23 MED ORDER — BLISTEX MEDICATED EX OINT
TOPICAL_OINTMENT | CUTANEOUS | Status: DC | PRN
  Filled 2019-10-23 (×2): qty 6.3

## 2019-10-23 MED ORDER — SODIUM CHLORIDE 0.9 % IV SOLN: INTRAVENOUS | Status: DC | PRN

## 2019-10-23 MED ORDER — HYDROCHLOROTHIAZIDE 25 MG PO TABS
25.0000 mg | ORAL_TABLET | Freq: Every day | ORAL | Status: DC
  Administered 2019-10-23: 25 mg via ORAL
  Filled 2019-10-23 (×2): qty 1

## 2019-10-23 NOTE — Progress Notes (Signed)
Pt has a cuff leak

## 2019-10-23 NOTE — Progress Notes (Signed)
Extubation Procedure Note  Patient Details:   Name: LENARDO WESTWOOD DOB: Nov 01, 1960 MRN: [id number]   Airway Documentation:    Vent end date: 10/23/19 Vent end time: 1030   Evaluation  O2 sats: stable throughout Complications: No apparent complications Patient did tolerate procedure well. Bilateral Breath Sounds: Clear, Diminished   Yes  Leak test positive. No stridor noted.  Yanai Hobson 10/23/2019, 10:45 AM

## 2019-10-23 NOTE — Progress Notes (Signed)
NAME:  Marco Pittman, MRN:  [id number], DOB:  1960/12/11, LOS: 3 ADMISSION DATE:  10/20/2019, CONSULTATION DATE:  10/20/2019 REFERRING MD:  Hyacinth Meeker, CHIEF COMPLAINT:  Angioedema with upper airway involvement   Brief History   This is a 59 y.o. male who was admitted 7/15 with angioedema after taking lisinopril for the first time the day prior.  He required intubation in the ED for airway protection.   History of present illness   Marco Pittman is a 59 y.o. male who has a PMH as outlined below.  He presented to [facility] 7/15 with angioedema after taking lisinopril for the first time the day prior.  He had been seen by PCP and was advised to go to ED.  In ED, he required intubation for airway protection after edema had worsened.  Past Medical History  Essential hypertension, benign; Chronic pancreatitis (HCC); History of alcohol abuse; Weight loss, unintentional; Pre-diabetes; Chronic left shoulder pain; Primary biliary cirrhosis (HCC); Sessile colonic polyp; Former smoker; Angio-edema; and Acute respiratory failure (HCC)   Significant Hospital Events   Intubated in ED for airway protection  Consults:  None  Procedures:  ETT 7/15  Significant Diagnostic Tests:  None  Micro Data:   SARS Coronavirus 2  NEGATIVE    Antimicrobials:  None   Interim history/subjective:  Remains sedated on vent  Objective   Blood pressure (!) 150/81, pulse (!) 44, temperature (!) 97.3 F (36.3 C), temperature source Oral, resp. rate 16, height 5' 9.5" (1.765 m), weight 83.1 kg, SpO2 98 %.    Vent Mode: PRVC FiO2 (%):  [40 %] 40 % Set Rate:  [16 bmp] 16 bmp Vt Set:  [570 mL] 570 mL PEEP:  [5 cmH20] 5 cmH20 Plateau Pressure:  [16 cmH20-20 cmH20] 20 cmH20   Intake/Output Summary (Last 24 hours) at 10/23/2019 0819 Last data filed at 10/23/2019 0600 Gross per 24 hour  Intake 4471.56 ml  Output 2315 ml  Net 2156.56 ml   Filed Weights   10/21/19 0500 10/22/19 0405 10/23/19 0353  Weight: 82.9 kg  82.6 kg 83.1 kg    Examination: General: NARD, responds appropriately HENT: Obvious edema in face; reportedly less c/w yesterday Lungs: Clear Cardiovascular: RRR; no m/r/g Abdomen: Supple without guarding Extremities: No C/C/E Neuro: No focal deficits  Assessment & Plan:  1) Acute respiratory failure requiring mechanical ventilation due to Angioedema 2/2 ACEi. Edema improved from yesterday, but still present. Cuff leak, tested with only 78 returned. Successfullly extubated, protecting airway. Edema of bilateral UEs. Plan --continue solumedrol, pepcid, benadryl --raise bilateral UE to level of heart, may add compression if needed 2) Non-severe (moderate) malnutrition in context of chronic illness Liquid diet, ADAT now extubated  Best practice:  Diet: Vital AF TF Pain/Anxiety/Delirium protocol (if indicated): propofol/fentanyl VAP protocol (if indicated): In place DVT prophylaxis: SCDs/heparin GI prophylaxis: Famotidine Glucose control: SSi prn Mobility: BR Code Status: Full Family Communication: Will update Disposition: hopefully can transfer tomorrow if he continues to protect airway and not require further respiratory support  Labs   CBC: Recent Labs  Lab 10/18/19 1509 10/20/19 1007 10/20/19 1351 10/20/19 1710 10/21/19 0538  WBC 4.0 3.9*  --  3.9* 2.4*  HGB 16.2 15.9 13.3 14.1 13.4  HCT 47.2 46.1 39.0 40.0 39.4  MCV 94 90.7  --  89.7 93.6  PLT 174 172  --  166 171    Basic Metabolic Panel: Recent Labs  Lab 10/18/19 1509 10/20/19 1007 10/20/19 1351 10/20/19 1710 10/21/19 0538  NA  138 138 139 139 140  K 3.7 3.7 4.1 4.4 4.3  CL 102 105  --  111 109  CO2 22 23  --  19* 23  GLUCOSE 95 109*  --  219* 140*  BUN 6* 9  --  7* 7*  CREATININE 0.89 1.02  --  0.83 1.06  CALCIUM 9.5 9.1  --  8.2* 8.2*  MG  --   --   --  1.8 2.4  PHOS  --   --   --  2.7 3.6   GFR: Estimated Creatinine Clearance: 74.4 mL/min (by C-G formula based on SCr of 1.06 mg/dL). Recent  Labs  Lab 10/18/19 1509 10/20/19 1007 10/20/19 1710 10/21/19 0538  WBC 4.0 3.9* 3.9* 2.4*  LATICACIDVEN  --   --   --  2.0*    Liver Function Tests: Recent Labs  Lab 10/18/19 1509 10/20/19 1710  AST 22 17  ALT 18 16  ALKPHOS 100 56  BILITOT 0.5 0.3  PROT 8.2 6.4*  ALBUMIN 4.4 2.8*   No results for input(s): LIPASE, AMYLASE in the last 168 hours. No results for input(s): AMMONIA in the last 168 hours.  ABG    Component Value Date/Time   PHART 7.375 10/20/2019 1351   PCO2ART 42.9 10/20/2019 1351   PO2ART 502 (H) 10/20/2019 1351   HCO3 25.1 10/20/2019 1351   TCO2 26 10/20/2019 1351   O2SAT 100.0 10/20/2019 1351     Coagulation Profile: No results for input(s): INR, PROTIME in the last 168 hours.  Cardiac Enzymes: No results for input(s): CKTOTAL, CKMB, CKMBINDEX, TROPONINI in the last 168 hours.  HbA1C: Hemoglobin A1C  Date/Time Value Ref Range Status  05/05/2018 09:12 AM 5.9 (A) 4.0 - 5.6 % Final  10/10/2016 09:20 AM 6.1  Final   Hgb A1c MFr Bld  Date/Time Value Ref Range Status  10/18/2019 03:09 PM 5.9 (H) 4.8 - 5.6 % Final    Comment:             Prediabetes: 5.7 - 6.4          Diabetes: >6.4          Glycemic control for adults with diabetes: <7.0   03/05/2016 05:43 AM 5.9 (H) 4.8 - 5.6 % Final    Comment:    (NOTE)         Pre-diabetes: 5.7 - 6.4         Diabetes: >6.4         Glycemic control for adults with diabetes: <7.0     CBG: Recent Labs  Lab 10/22/19 1205 10/22/19 1626 10/22/19 1927 10/22/19 2342 10/23/19 0325  GLUCAP 130* 144* 142* 130* 192*    Review of Systems:   Unable to obtain.  Past Medical History  He,  has a past medical history of Aneurysm (HCC), ETOH abuse, Hypertension, Pancreatitis (03/04/2016), and Umbilical hernia.   Surgical History    Past Surgical History:  Procedure Laterality Date  . INSERTION OF MESH N/A 04/14/2016   Procedure: INSERTION OF MESH;  Surgeon: Avel Peace, MD;  Location: [facility];  Service: General;  Laterality: N/A;  . UMBILICAL HERNIA REPAIR N/A 04/14/2016   Procedure: UMBILICAL HERNIA REPAIR WITH MESH;  Surgeon: Avel Peace, MD;  Location: [facility];  Service: General;  Laterality: N/A;     Social History   reports that he has been smoking cigars. He has a 22.50 pack-year smoking history. He has never used smokeless tobacco. He  reports current alcohol use of about 14.0 standard drinks of alcohol per week. He reports that he does not use drugs.   Family History   His family history includes Cancer (age of onset: 50) in his father.   Allergies Allergies  Allergen Reactions  . Lisinopril Swelling    Angioedema     Home Medications  Prior to Admission medications   Medication Sig Start Date End Date Taking? Authorizing Provider  amLODipine (NORVASC) 10 MG tablet Take 1 tablet (10 mg total) by mouth daily. 10/18/19  Yes Marcine Matar, MD  ursodiol (ACTIGALL) 500 MG tablet TAKE 1 TABLET BY MOUTH 3 (THREE) TIMES DAILY. Patient taking differently: Take 500 mg by mouth 3 (three) times daily.  10/18/19  Yes Marcine Matar, MD  carvedilol (COREG) 3.125 MG tablet Take 1 tablet (3.125 mg total) by mouth 2 (two) times daily with a meal. 10/20/19   Marcine Matar, MD  lisinopril (ZESTRIL) 10 MG tablet Take 10 mg by mouth daily.    [provider]     Shirlean Mylar, MD [facility], PGY-2

## 2019-10-24 DIAGNOSIS — T783XXD Angioneurotic edema, subsequent encounter: Secondary | ICD-10-CM

## 2019-10-24 LAB — BASIC METABOLIC PANEL
Anion gap: 12 (ref 5–15)
BUN: 16 mg/dL (ref 8–23)
CO2: 21 mmol/L — ABNORMAL LOW (ref 22–32)
Calcium: 8.9 mg/dL (ref 8.9–10.3)
Chloride: 107 mmol/L (ref 98–111)
Creatinine, Ser: 1.06 mg/dL (ref 0.61–1.24)
GFR calc Af Amer: >60 mL/min (ref >60)
GFR calc non Af Amer: >60 mL/min (ref >60)
Glucose, Bld: 125 mg/dL — ABNORMAL HIGH (ref 70–99)
Potassium: 3.9 mmol/L (ref 3.5–5.1)
Sodium: 140 mmol/L (ref 135–145)

## 2019-10-24 LAB — GLUCOSE, CAPILLARY
Glucose-Capillary: 152 mg/dL — ABNORMAL HIGH (ref 70–99)
Glucose-Capillary: 145 mg/dL — ABNORMAL HIGH (ref 70–99)

## 2019-10-24 MED ORDER — DIPHENHYDRAMINE HCL 25 MG PO CAPS: 50.0000 mg | ORAL_CAPSULE | Freq: Four times a day (QID) | ORAL | Status: DC | PRN

## 2019-10-24 MED ORDER — HYDROCHLOROTHIAZIDE 50 MG PO TABS
50.0000 mg | ORAL_TABLET | Freq: Every day | ORAL | Status: DC
  Filled 2019-10-24: qty 1

## 2019-10-24 MED ORDER — HYDROCHLOROTHIAZIDE 50 MG PO TABS
50.0000 mg | ORAL_TABLET | Freq: Every day | ORAL | Status: DC
  Administered 2019-10-24 – 2019-10-26 (×3): 50 mg via ORAL
  Filled 2019-10-24: qty 2
  Filled 2019-10-24: qty 1
  Filled 2019-10-24: qty 2
  Filled 2019-10-24 (×2): qty 1

## 2019-10-24 MED ORDER — METHYLPREDNISOLONE SODIUM SUCC 125 MG IJ SOLR
60.0000 mg | Freq: Two times a day (BID) | INTRAMUSCULAR | Status: DC
  Administered 2019-10-24: 60 mg via INTRAVENOUS
  Filled 2019-10-24: qty 2

## 2019-10-24 MED ORDER — HYDRALAZINE HCL 10 MG PO TABS
10.0000 mg | ORAL_TABLET | Freq: Three times a day (TID) | ORAL | Status: DC | PRN
  Administered 2019-10-24 (×2): 10 mg via ORAL
  Filled 2019-10-24 (×2): qty 1

## 2019-10-24 NOTE — Progress Notes (Signed)
NAME:  Marco Pittman, MRN:  [id number], DOB:  10-Aug-1960, LOS: 4 ADMISSION DATE:  10/20/2019, CONSULTATION DATE:  10/20/2019 REFERRING MD:  Hyacinth Meeker, CHIEF COMPLAINT:  Angioedema with upper airway involvement   Brief History   This is a 59 y.o. male who was admitted 7/15 with angioedema after taking lisinopril for the first time the day prior.  He required intubation in the ED for airway protection.   History of present illness   Marco Pittman is a 59 y.o. male who has a PMH as outlined below.  He presented to [facility] 7/15 with angioedema after taking lisinopril for the first time the day prior.  He had been seen by PCP and was advised to go to ED.  In ED, he required intubation for airway protection after edema had worsened.  Past Medical History  Essential hypertension, benign; Chronic pancreatitis (HCC); History of alcohol abuse; Weight loss, unintentional; Pre-diabetes; Chronic left shoulder pain; Primary biliary cirrhosis (HCC); Sessile colonic polyp; Former smoker; Angio-edema; and Acute respiratory failure (HCC)   Significant Hospital Events   Intubated in ED for airway protection  Consults:  None  Procedures:  ETT 7/15  Significant Diagnostic Tests:  None  Micro Data:   SARS Coronavirus 2  NEGATIVE    Antimicrobials:  None   Interim history/subjective:  Extubated yesterday. No acute events overnight. Doing well today, at baseline, sitting in chair, feeling much better.  Objective   Blood pressure (!) 158/103, pulse 69, temperature 98 F (36.7 C), temperature source Oral, resp. rate (!) 27, height 5' 9.5" (1.765 m), weight 83.2 kg, SpO2 98 %.      Intake/Output Summary (Last 24 hours) at 10/24/2019 1008 Last data filed at 10/24/2019 0900 Gross per 24 hour  Intake 929.53 ml  Output 2950 ml  Net -2020.47 ml   Filed Weights   10/22/19 0405 10/23/19 0353 10/24/19 0500  Weight: 82.6 kg 83.1 kg 83.2 kg    Examination: General: NARD, responds appropriately,  sitting in chair HENT: improved edema of lips and mouth, now minimal Lungs: Clear Cardiovascular: RRR; no m/r/g Abdomen: Supple without guarding Extremities: No C/C/E Neuro: No focal deficits  Assessment & Plan:  1) Acute respiratory failure requiring mechanical ventilation due to Angioedema 2/2 ACEi. Resolved, extubated yesterday. Plan --Decrease solumedrol to 60mg  q12h --Decrease benadryl to 50mg  PO q6h PRN --Discontinue pepcid --Transfer to floor, hospitalist service  2) Non-severe (moderate) malnutrition in context of chronic illness Heart healthy diet  3) TN Uncontrolled, 170s/90s. --Amlodipine 10 mg daily --Increased HCTZ 50 mg daily  Best practice:  Diet: heart healthy Pain/Anxiety/Delirium protocol (if indicated): n/a VAP protocol (if indicated): n/a DVT prophylaxis: SCDs/heparin GI prophylaxis: none Glucose control: SSi prn Mobility: up w/ assistance Code Status: Full Family Communication: Will update Disposition: transfer to floor  Labs   CBC: Recent Labs  Lab 10/18/19 1509 10/20/19 1007 10/20/19 1351 10/20/19 1710 10/21/19 0538  WBC 4.0 3.9*  --  3.9* 2.4*  HGB 16.2 15.9 13.3 14.1 13.4  HCT 47.2 46.1 39.0 40.0 39.4  MCV 94 90.7  --  89.7 93.6  PLT 174 172  --  166 171    Basic Metabolic Panel: Recent Labs  Lab 10/18/19 1509 10/18/19 1509 10/20/19 1007 10/20/19 1351 10/20/19 1710 10/21/19 0538 10/24/19 0404  NA 138  --  138 139 139 140 140  K 3.7   < > 3.7 4.1 4.4 4.3 3.9  CL 102  --  105  --  111 109 107  CO2 22  --  23  --  19* 23 21*  GLUCOSE 95  --  109*  --  219* 140* 125*  BUN 6*  --  9  --  7* 7* 16  CREATININE 0.89  --  1.02  --  0.83 1.06 1.06  CALCIUM 9.5  --  9.1  --  8.2* 8.2* 8.9  MG  --   --   --   --  1.8 2.4  --   PHOS  --   --   --   --  2.7 3.6  --    < > = values in this interval not displayed.   GFR: Estimated Creatinine Clearance: 74.4 mL/min (by C-G formula based on SCr of 1.06 mg/dL). Recent Labs  Lab  10/18/19 1509 10/20/19 1007 10/20/19 1710 10/21/19 0538  WBC 4.0 3.9* 3.9* 2.4*  LATICACIDVEN  --   --   --  2.0*    Liver Function Tests: Recent Labs  Lab 10/18/19 1509 10/20/19 1710  AST 22 17  ALT 18 16  ALKPHOS 100 56  BILITOT 0.5 0.3  PROT 8.2 6.4*  ALBUMIN 4.4 2.8*   No results for input(s): LIPASE, AMYLASE in the last 168 hours. No results for input(s): AMMONIA in the last 168 hours.  ABG    Component Value Date/Time   PHART 7.375 10/20/2019 1351   PCO2ART 42.9 10/20/2019 1351   PO2ART 502 (H) 10/20/2019 1351   HCO3 25.1 10/20/2019 1351   TCO2 26 10/20/2019 1351   O2SAT 100.0 10/20/2019 1351     Coagulation Profile: No results for input(s): INR, PROTIME in the last 168 hours.  Cardiac Enzymes: No results for input(s): CKTOTAL, CKMB, CKMBINDEX, TROPONINI in the last 168 hours.  HbA1C: Hemoglobin A1C  Date/Time Value Ref Range Status  05/05/2018 09:12 AM 5.9 (A) 4.0 - 5.6 % Final  10/10/2016 09:20 AM 6.1  Final   Hgb A1c MFr Bld  Date/Time Value Ref Range Status  10/18/2019 03:09 PM 5.9 (H) 4.8 - 5.6 % Final    Comment:             Prediabetes: 5.7 - 6.4          Diabetes: >6.4          Glycemic control for adults with diabetes: <7.0   03/05/2016 05:43 AM 5.9 (H) 4.8 - 5.6 % Final    Comment:    (NOTE)         Pre-diabetes: 5.7 - 6.4         Diabetes: >6.4         Glycemic control for adults with diabetes: <7.0     CBG: Recent Labs  Lab 10/23/19 0325 10/23/19 0843 10/23/19 1139 10/23/19 1641 10/24/19 0736  GLUCAP 192* 119* 111* 120* 152*    Review of Systems:   Unable to obtain.  Past Medical History  He,  has a past medical history of Aneurysm (HCC), ETOH abuse, Hypertension, Pancreatitis (03/04/2016), and Umbilical hernia.   Surgical History    Past Surgical History:  Procedure Laterality Date  . INSERTION OF MESH N/A 04/14/2016   Procedure: INSERTION OF MESH;  Surgeon: Avel Peace, MD;  Location: [facility];  Service: General;  Laterality: N/A;  . UMBILICAL HERNIA REPAIR N/A 04/14/2016   Procedure: UMBILICAL HERNIA REPAIR WITH MESH;  Surgeon: Avel Peace, MD;  Location: [facility];  Service: General;  Laterality: N/A;     Social History  reports that he has been smoking cigars. He has a 22.50 pack-year smoking history. He has never used smokeless tobacco. He reports current alcohol use of about 14.0 standard drinks of alcohol per week. He reports that he does not use drugs.   Family History   His family history includes Cancer (age of onset: 57) in his father.   Allergies Allergies  Allergen Reactions  . Lisinopril Swelling    Angioedema     Home Medications  Prior to Admission medications   Medication Sig Start Date End Date Taking? Authorizing Provider  amLODipine (NORVASC) 10 MG tablet Take 1 tablet (10 mg total) by mouth daily. 10/18/19  Yes Marcine Matar, MD  ursodiol (ACTIGALL) 500 MG tablet TAKE 1 TABLET BY MOUTH 3 (THREE) TIMES DAILY. Patient taking differently: Take 500 mg by mouth 3 (three) times daily.  10/18/19  Yes Marcine Matar, MD  carvedilol (COREG) 3.125 MG tablet Take 1 tablet (3.125 mg total) by mouth 2 (two) times daily with a meal. 10/20/19   Marcine Matar, MD  lisinopril (ZESTRIL) 10 MG tablet Take 10 mg by mouth daily.    [provider]     Shirlean Mylar, MD [facility], PGY-2

## 2019-10-24 NOTE — Progress Notes (Signed)
°   10/24/19 2007  Assess: MEWS Score  Temp 99.3 F (37.4 C)  BP (!) 171/108 (RN notified)  Pulse Rate 91  Resp (!) 26  SpO2 98 %  Assess: MEWS Score  MEWS Temp 0  MEWS Systolic 0  MEWS Pulse 0  MEWS RR 2  MEWS LOC 0  MEWS Score 2  MEWS Score Color Yellow  Assess: if the MEWS score is Yellow or Red  Were vital signs taken at a resting state? Yes  Focused Assessment No change from prior assessment  Early Detection of Sepsis Score *See Row Information* Low  MEWS guidelines implemented *See Row Information* Yes  Treat  MEWS Interventions Administered prn meds/treatments  Pain Scale 0-10  Pain Score 0  Take Vital Signs  Increase Vital Sign Frequency  Yellow: Q 2hr X 2 then Q 4hr X 2, if remains yellow, continue Q 4hrs  Escalate  MEWS: Escalate Yellow: discuss with charge nurse/RN and consider discussing with provider and RRT  Notify: Charge Nurse/RN  Name of Charge Nurse/RN Notified Josephine  Date Charge Nurse/RN Notified 10/24/19  Time Charge Nurse/RN Notified 2053  Notify: Provider  Provider Name/Title Katherina Right  Date Provider Notified 10/24/19  Time Provider Notified 2051  Notification Type Page  Notification Reason Other (Comment) (per protocol)  Response No new orders  Document  Patient Outcome Other (Comment) (prn meds given)  Progress note created (see row info) Yes  pt transferred from 36M. Pt on yellow mews for hypertension, prn hydralazine administered,on call MD notified

## 2019-10-24 NOTE — Progress Notes (Signed)
eLink Physician-Brief Progress Note Patient Name: BLANCA THORNTON DOB: [birth year] MRN: [id number]   Date of Service  10/24/2019  HPI/Events of Note  Notified of BP 177/102  HR 66 on amlodipine 10 and HCTZ 25. Carvedilol and lisinopril not resumed.  eICU Interventions  Ordered hydralazine prn. Resume other home meds once HR and renal function permits     Intervention Category Major Interventions: Hypertension - evaluation and management  Darl Pikes 10/24/2019, 2:46 AM

## 2019-10-24 NOTE — Progress Notes (Signed)
Patient requested to hold on to his wallet and keys and will send with friend Deniece Portela when he comes by.

## 2019-10-25 DIAGNOSIS — J9601 Acute respiratory failure with hypoxia: Secondary | ICD-10-CM

## 2019-10-25 DIAGNOSIS — E440 Moderate protein-calorie malnutrition: Secondary | ICD-10-CM

## 2019-10-25 DIAGNOSIS — I10 Essential (primary) hypertension: Secondary | ICD-10-CM

## 2019-10-25 LAB — BASIC METABOLIC PANEL
Anion gap: 8 (ref 5–15)
BUN: 16 mg/dL (ref 8–23)
CO2: 27 mmol/L (ref 22–32)
Calcium: 8.9 mg/dL (ref 8.9–10.3)
Chloride: 102 mmol/L (ref 98–111)
Creatinine, Ser: 0.9 mg/dL (ref 0.61–1.24)
GFR calc Af Amer: >60 mL/min (ref >60)
GFR calc non Af Amer: >60 mL/min (ref >60)
Glucose, Bld: 156 mg/dL — ABNORMAL HIGH (ref 70–99)
Potassium: 3.6 mmol/L (ref 3.5–5.1)
Sodium: 137 mmol/L (ref 135–145)

## 2019-10-25 MED ORDER — METHYLPREDNISOLONE SODIUM SUCC 125 MG IJ SOLR
60.0000 mg | Freq: Every day | INTRAMUSCULAR | Status: DC
  Administered 2019-10-25 – 2019-10-26 (×2): 60 mg via INTRAVENOUS
  Filled 2019-10-25 (×2): qty 2

## 2019-10-25 MED ORDER — ADULT MULTIVITAMIN W/MINERALS CH
1.0000 | ORAL_TABLET | Freq: Every day | ORAL | Status: DC
  Administered 2019-10-25 – 2019-10-26 (×2): 1 via ORAL
  Filled 2019-10-25 (×2): qty 1

## 2019-10-25 MED ORDER — ENSURE ENLIVE PO LIQD
237.0000 mL | Freq: Three times a day (TID) | ORAL | Status: DC
  Administered 2019-10-25 – 2019-10-26 (×3): 237 mL via ORAL

## 2019-10-25 NOTE — Progress Notes (Signed)
Patient ID: Marco Pittman, male   DOB: 08-Jun-1960, 59 y.o.   MRN: [id number]  PROGRESS NOTE    Marco Pittman  WJX:[id number] DOB: 21-Jan-1961 DOA: 10/20/2019 PCP: Ladell Pier, MD   Brief Narrative:  59 year old male with history of essential hypertension, alcohol abuse, chronic pancreatitis, unintentional weight loss, prediabetes, chronic left shoulder pain, primary biliary cirrhosis presented on 10/20/2019 with angioedema after taking lisinopril for the first time the day prior.  He was intubated in the ED for airway protection and admitted to ICU under PCCM service.  He was treated with IV Solu-Medrol, Benadryl and H2 blockers.  He was extubated on 10/23/2019.  He was transferred to [facility] service on 10/25/2019.  Assessment & Plan:   Acute hypoxic respiratory failure requiring mechanical ventilation -Intubated on presentation.  Status post extubation on 10/23/2019.  Currently on room air -Patient has been [facility] to [facility] service from 10/25/2019 onwards.  Angioedema, most likely secondary to ACE inhibitor use -ACE inhibitor has been listed as allergy. -Apparently, lips and tongue swelling are improving -Currently on tapering doses of intravenous Solu-Medrol at 60 mg every 12 hours.  Decrease Solu-Medrol to 60 mg IV daily and switch to oral prednisone tomorrow. -Continue Benadryl as needed.  Pepcid has been discontinued by PCCM.   Moderate malnutrition -Follow nutrition recommendations  Hypertension -Blood pressure still elevated but improving.  Continue amlodipine and hydrochlorothiazide.  Generalized conditioning -PT eval   DVT prophylaxis: Subcutaneous heparin  code Status: Full Family Communication: Spoke to patient at bedside Disposition Plan: Status is: Inpatient  Remains inpatient appropriate because:Inpatient level of care appropriate due to severity of illness.  Probable discharge tomorrow if swelling keeps improving and tolerates weaning of steroids and PT  eval.   Dispo: The patient is from: Home              Anticipated d/c is to: Home              Anticipated d/c date is: 1 day              Patient currently is not medically stable to d/c.  Consultants: PCCM  Procedures:  Intubation from 10/20/2019-10/23/2019  Antimicrobials: None   Subjective: Patient seen and examined at bedside.  He feels that his lips and tongue swelling are improving.  Denies any worsening shortness of breath, cough, fever, nausea or vomiting.  Feels that he may be able to go home tomorrow.  Objective: Vitals:   10/25/19 0008 10/25/19 0423 10/25/19 0800 10/25/19 0832  BP: (!) 170/103 (!) 140/96 (!) 156/90 (!) 158/96  Pulse: 72 72 70 67  Resp: _0 (!) 22  Temp: 98.9 F (37.2 C) 98.6 F (37 C) 98.7 F (37.1 C) 98.6 F (37 C)  TempSrc: Oral Oral Oral Oral  SpO2: 95%  96% 96%  Weight:      Height:        Intake/Output Summary (Last 24 hours) at 10/25/2019 0935 Last data filed at 10/25/2019 0648 Gross per 24 hour  Intake 270 ml  Output 2555 ml  Net -2285 ml   Filed Weights   10/23/19 0353 10/24/19 0500 10/24/19 2007  Weight: 83.1 kg 83.2 kg 79.7 kg    Examination:  General exam: Appears calm and comfortable.  Looks older than stated age. ENT: Not much swelling of lips and mouth or tongue Respiratory system: Bilateral decreased breath sounds at bases with some scattered crackles Cardiovascular system: S1 & S2 heard, Rate controlled Gastrointestinal system: Abdomen is  nondistended, soft and nontender. Normal bowel sounds heard. Extremities: No cyanosis, clubbing, edema    Data Reviewed: I have personally reviewed following labs and imaging studies  CBC: Recent Labs  Lab 10/18/19 1509 10/20/19 1007 10/20/19 1351 10/20/19 1710 10/21/19 0538  WBC 4.0 3.9*  --  3.9* 2.4*  HGB 16.2 15.9 13.3 14.1 13.4  HCT 47.2 46.1 39.0 40.0 39.4  MCV 94 90.7  --  89.7 93.6  PLT 174 172  --  166 951   Basic Metabolic Panel: Recent Labs  Lab  10/20/19 1007 10/20/19 1007 10/20/19 1351 10/20/19 1710 10/21/19 0538 10/24/19 0404 10/25/19 0245  NA 138   < > 139 139 140 140 137  K 3.7   < > 4.1 4.4 4.3 3.9 3.6  CL 105  --   --  111 109 107 102  CO2 23  --   --  19* 23 21* 27  GLUCOSE 109*  --   --  219* 140* 125* 156*  BUN 9  --   --  7* 7* 16 16  CREATININE 1.02  --   --  0.83 1.06 1.06 0.90  CALCIUM 9.1  --   --  8.2* 8.2* 8.9 8.9  MG  --   --   --  1.8 2.4  --   --   PHOS  --   --   --  2.7 3.6  --   --    < > = values in this interval not displayed.   GFR: Estimated Creatinine Clearance: 87.7 mL/min (by C-G formula based on SCr of 0.9 mg/dL). Liver Function Tests: Recent Labs  Lab 10/18/19 1509 10/20/19 1710  AST 22 17  ALT 18 16  ALKPHOS 100 56  BILITOT 0.5 0.3  PROT 8.2 6.4*  ALBUMIN 4.4 2.8*   No results for input(s): LIPASE, AMYLASE in the last 168 hours. No results for input(s): AMMONIA in the last 168 hours. Coagulation Profile: No results for input(s): INR, PROTIME in the last 168 hours. Cardiac Enzymes: No results for input(s): CKTOTAL, CKMB, CKMBINDEX, TROPONINI in the last 168 hours. BNP (last 3 results) No results for input(s): PROBNP in the last 8760 hours. HbA1C: No results for input(s): HGBA1C in the last 72 hours. CBG: Recent Labs  Lab 10/23/19 0843 10/23/19 1139 10/23/19 1641 10/24/19 0736 10/24/19 1127  GLUCAP 119* 111* 120* 152* 145*   Lipid Profile: No results for input(s): CHOL, HDL, LDLCALC, TRIG, CHOLHDL, LDLDIRECT in the last 72 hours. Thyroid Function Tests: No results for input(s): TSH, T4TOTAL, FREET4, T3FREE, THYROIDAB in the last 72 hours. Anemia Panel: No results for input(s): VITAMINB12, FOLATE, FERRITIN, TIBC, IRON, RETICCTPCT in the last 72 hours. Sepsis Labs: Recent Labs  Lab 10/21/19 0538  LATICACIDVEN 2.0*    Recent Results (from the past 240 hour(s))  SARS Coronavirus 2 by RT PCR (hospital order, performed in [facility] hospital lab) Nasopharyngeal  Nasopharyngeal Swab     Status: None   Collection Time: 10/20/19 11:10 AM   Specimen: Nasopharyngeal Swab  Result Value Ref Range Status   SARS Coronavirus 2 NEGATIVE NEGATIVE Final    Comment: (NOTE) SARS-CoV-2 target nucleic acids are NOT DETECTED.  The SARS-CoV-2 RNA is generally detectable in upper and lower respiratory specimens during the acute phase of infection. The lowest concentration of SARS-CoV-2 viral copies this assay can detect is 250 copies / mL. A negative result does not preclude SARS-CoV-2 infection and should not be used as the sole basis for treatment  or other patient management decisions.  A negative result may occur with improper specimen collection / handling, submission of specimen other than nasopharyngeal swab, presence of viral mutation(s) within the areas targeted by this assay, and inadequate number of viral copies (<250 copies / mL). A negative result must be combined with clinical observations, patient history, and epidemiological information.  Fact Sheet for Patients:   StrictlyIdeas.no  Fact Sheet for Healthcare Providers: BankingDealers.co.za  This test is not yet approved or  cleared by the [place] FDA and has been authorized for detection and/or diagnosis of SARS-CoV-2 by FDA under an Emergency Use Authorization (EUA).  This EUA will remain in effect (meaning this test can be used) for the duration of the COVID-19 declaration under Section 564(b)(1) of the Act, 21 U.S.C. section 360bbb-3(b)(1), unless the authorization is terminated or revoked sooner.  Performed at [facility], [place] [address], [place], [place] [address]   MRSA PCR Screening     Status: None   Collection Time: 10/20/19  1:25 PM   Specimen: Nasal Mucosa; Nasopharyngeal  Result Value Ref Range Status   MRSA by PCR NEGATIVE NEGATIVE Final    Comment:        The GeneXpert MRSA Assay (FDA approved for NASAL  specimens only), is one component of a comprehensive MRSA colonization surveillance program. It is not intended to diagnose MRSA infection nor to guide or monitor treatment for MRSA infections. Performed at [facility], [place] [address], [place], [place] [address]          Radiology Studies: No results found.      Scheduled Meds: . amLODipine  10 mg Oral Daily  . chlorhexidine gluconate (MEDLINE KIT)  15 mL Mouth Rinse BID  . Chlorhexidine Gluconate Cloth  6 each Topical Daily  . heparin  5,000 Units Subcutaneous Q8H  . hydrochlorothiazide  50 mg Oral Daily  . methylPREDNISolone (SOLU-MEDROL) injection  60 mg Intravenous Q12H  . QUEtiapine  25 mg Per Tube BID   Continuous Infusions: . sodium chloride            Aline August, MD [facility] 10/25/2019, 9:35 AM

## 2019-10-25 NOTE — Evaluation (Signed)
Physical Therapy Evaluation - One-time  Patient Details Name: Marco Pittman MRN: [id number] DOB: 21-Mar-1961 Today's Date: 10/25/2019   History of Present Illness  59 yo male admitted to ED on 7/15 due to angioedema after taking lisinopril for the first time, ETT 7/15-7/18 for airway protection. PMH includes aneurysm, ETOH abuse, HTN, umbilical hernia repair 2018, pancreatitis.  Clinical Impression   Pt presents with [facility] strength, ROM, balance, and activity tolerance. VSS during mobility with pt maintaining SpO2 98-100% on RA. Pt proficiently navigated hallway distance with no AD and WFL speed and balance, and navigated full flight of steps demonstrating readiness to d/c to apt post-acutely. Pt with no acute or post-acute PT needs, will sign off.       Follow Up Recommendations No PT follow up    Equipment Recommendations  None recommended by PT    Recommendations for Other Services       Precautions / Restrictions Precautions Precautions: None Restrictions Weight Bearing Restrictions: No      Mobility  Bed Mobility Overal bed mobility: Independent                Transfers Overall transfer level: Independent                  Ambulation/Gait Ambulation/Gait assistance: Supervision Gait Distance (Feet): 500 Feet Assistive device: None Gait Pattern/deviations: Step-through pattern;WFL(Within Functional Limits) Gait velocity: WFL   General Gait Details: WFL gait, tolerates challenge well (see balance section). HR 70s-90s bpm durimg mobility, with SpO2 98-100%.  Stairs Stairs: Yes Stairs assistance: Supervision Stair Management: One rail Right Number of Stairs: 20 General stair comments: supervision for safety, step-over-step with use of railing on R to steady self as needed. No LOB but PT did encourage pt to slow down when descending for control  Wheelchair Mobility    Modified Rankin (Stroke Patients Only)       Balance Overall balance  assessment: Modified Independent                               Standardized Balance Assessment Standardized Balance Assessment : Dynamic Gait Index   Dynamic Gait Index Level Surface: Normal Change in Gait Speed: Normal Gait with Horizontal Head Turns: Normal Gait with Vertical Head Turns: Normal Gait and Pivot Turn: Normal Step Over Obstacle: Normal Step Around Obstacles: Normal Steps: Mild Impairment Total Score: 23       Pertinent Vitals/Pain Pain Assessment: No/denies pain    Home Living Family/patient expects to be discharged to:: Private residence Living Arrangements: Other (Comment) (roommate) Available Help at Discharge: Family Type of Home: Apartment Home Access: Stairs to enter Entrance Stairs-Rails: Doctor, general practice of Steps: flight Home Layout: One level Home Equipment: None      Prior Function Level of Independence: Independent         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     Hand Dominance   Dominant Hand: Right    Extremity/Trunk Assessment   Upper Extremity Assessment Upper Extremity Assessment: Overall WFL for tasks assessed    Lower Extremity Assessment Lower Extremity Assessment: Overall WFL for tasks assessed    Cervical / Trunk Assessment Cervical / Trunk Assessment: Normal  Communication   Communication: No difficulties  Cognition Arousal/Alertness: Awake/alert Behavior During Therapy: WFL for tasks assessed/performed Overall Cognitive Status: Within Functional Limits for tasks assessed  General Comments      Exercises     Assessment/Plan    PT Assessment Patent does not need any further PT services  PT Problem List         PT Treatment Interventions      PT Goals (Current goals can be found in the Care Plan section)  Acute Rehab PT Goals PT Goal Formulation: With patient Time For Goal Achievement: 10/25/19 Potential to  Achieve Goals: Good    Frequency     Barriers to discharge        Co-evaluation               AM-PAC PT "6 Clicks" Mobility  Outcome Measure Help needed turning from your back to your side while in a flat bed without using bedrails?: None Help needed moving from lying on your back to sitting on the side of a flat bed without using bedrails?: None Help needed moving to and from a bed to a chair (including a wheelchair)?: None Help needed standing up from a chair using your arms (e.g., wheelchair or bedside chair)?: None Help needed to walk in hospital room?: None Help needed climbing 3-5 steps with a railing? : A Little 6 Click Score: 23    End of Session   Activity Tolerance: Patient tolerated treatment well Patient left: in chair;with call bell/phone within reach Nurse Communication: Mobility status PT Visit Diagnosis: Other abnormalities of gait and mobility (R26.89)    Time: 1914-7829 PT Time Calculation (min) (ACUTE ONLY): 14 min   Charges:   PT Evaluation $PT Eval Low Complexity: 1 Low        Jonna Dittrich E, PT Acute Rehabilitation Services Pager [phone]  Office [phone]   Adonai Helzer D Despina Hidden 10/25/2019, 3:46 PM

## 2019-10-25 NOTE — Progress Notes (Signed)
Nutrition Follow-up  RD working remotely.  DOCUMENTATION CODES:   Non-severe (moderate) malnutrition in context of chronic illness  INTERVENTION:   -Ensure Enlive po TID, each supplement provides 350 kcal and 20 grams of protein -MVI with minerals daily  NUTRITION DIAGNOSIS:   Moderate Malnutrition related to chronic illness (pancreatitis/cirrhosis) as evidenced by mild fat depletion, severe muscle depletion.  Ongoing  GOAL:   Patient will meet greater than or equal to 90% of their needs  Progressing   MONITOR:   PO intake, Supplement acceptance, Diet advancement, Labs, Weight trends, Skin, I & O's  REASON FOR ASSESSMENT:   Consult, Ventilator Enteral/tube feeding initiation and management  ASSESSMENT:   Patient with PMH significant for chronic pancreatitis, ETOH abuse, primary biliary cirrhosis, and essential HTN. Presents this admission with angioedema.  7/18- extubated, advanced to heart healthy duet  Reviewed I/O's: -2.4 L x 24 hours and -1.6 L since admission  UOP: 2.7 L x 24 hours  Attempted to speak with pt via hospital room phone, however, no answer.   Pt on a heart healthy diet with fair intake; noted meal completion 20-75% (avergaing 50% of meals). Per MD notes, lip and tongue swelling are improving.   Medications reviewed and include IV solu-medrol. Plan tp transition to PO solu-medrol tomorrow.   Labs reviewed: CBGS: 120-152 (steriods likely contributing to elevated CBGS).  Diet Order:   Diet Order            Diet Heart Room service appropriate? Yes; Fluid consistency: Thin  Diet effective now                 EDUCATION NEEDS:   Not appropriate for education at this time  Skin:  Skin Assessment: Reviewed RN Assessment  Last BM:  10/24/19  Height:   Ht Readings from Last 1 Encounters:  10/24/19 5' 9.5" (1.765 m)    Weight:   Wt Readings from Last 1 Encounters:  10/24/19 79.7 kg    Ideal Body Weight:  74.1 kg  BMI:  Body mass  index is 25.56 kg/m.  Estimated Nutritional Needs:   Kcal:  2050-2250  Protein:  110-125 grams  Fluid:  > 2 L    Levada Schilling, RD, LDN, CDCES Registered Dietitian II Certified Diabetes Care and Education Specialist Please refer to [facility] for RD and/or RD on-call/weekend/after hours pager

## 2019-10-26 DIAGNOSIS — G9341 Metabolic encephalopathy: Secondary | ICD-10-CM | POA: Diagnosis present

## 2019-10-26 LAB — CBC WITH DIFFERENTIAL/PLATELET
Abs Immature Granulocytes: 0.04 10*3/uL (ref 0.00–0.07)
Basophils Absolute: 0 10*3/uL (ref 0.0–0.1)
Basophils Relative: 0 %
Eosinophils Absolute: 0 10*3/uL (ref 0.0–0.5)
Eosinophils Relative: 0 %
HCT: 44.3 % (ref 39.0–52.0)
Hemoglobin: 15.7 g/dL (ref 13.0–17.0)
Immature Granulocytes: 1 %
Lymphocytes Relative: 30 %
Lymphs Abs: 2.4 10*3/uL (ref 0.7–4.0)
MCH: 31.3 pg (ref 26.0–34.0)
MCHC: 35.4 g/dL (ref 30.0–36.0)
MCV: 88.2 fL (ref 80.0–100.0)
Monocytes Absolute: 1 10*3/uL (ref 0.1–1.0)
Monocytes Relative: 13 %
Neutro Abs: 4.5 10*3/uL (ref 1.7–7.7)
Neutrophils Relative %: 56 %
Platelets: 167 10*3/uL (ref 150–400)
RBC: 5.02 MIL/uL (ref 4.22–5.81)
RDW: 12.9 % (ref 11.5–15.5)
WBC: 7.9 10*3/uL (ref 4.0–10.5)
nRBC: 0 % (ref 0.0–0.2)

## 2019-10-26 LAB — BASIC METABOLIC PANEL
Anion gap: 11 (ref 5–15)
BUN: 23 mg/dL (ref 8–23)
CO2: 30 mmol/L (ref 22–32)
Calcium: 9.5 mg/dL (ref 8.9–10.3)
Chloride: 100 mmol/L (ref 98–111)
Creatinine, Ser: 0.95 mg/dL (ref 0.61–1.24)
GFR calc Af Amer: >60 mL/min (ref >60)
GFR calc non Af Amer: >60 mL/min (ref >60)
Glucose, Bld: 125 mg/dL — ABNORMAL HIGH (ref 70–99)
Potassium: 3.2 mmol/L — ABNORMAL LOW (ref 3.5–5.1)
Sodium: 141 mmol/L (ref 135–145)

## 2019-10-26 LAB — MAGNESIUM: Magnesium: 2.4 mg/dL (ref 1.7–2.4)

## 2019-10-26 MED ORDER — PREDNISONE 10 MG PO TABS
ORAL_TABLET | ORAL | 0 refills | Status: DC
Start: 2019-10-26 — End: 2019-11-05

## 2019-10-26 MED FILL — predniSONE 10 MG TABS: 10 | 10 days supply | Qty: 30 | Fill #0

## 2019-10-26 NOTE — Discharge Summary (Signed)
Discharge Summary  Marco Pittman SMO:[id number] DOB: 1960-09-01  PCP: Marcine Matar, MD  Admit date: 10/20/2019 Discharge date: 10/26/2019  Time spent: 25 minutes  Recommendations for Outpatient Follow-up:  1. New medication: Prednisone taper. 2. Patient will follow up with his PCP in the next few weeks.  At that time, it can be determined if he needs to start an additional blood pressure medication to replace his lisinopril. 3. Patient advised to avoid all ACE inhibitors and ARB in the future.  Discharge Diagnoses:  Active Hospital Problems   Diagnosis Date Noted  . Acute respiratory failure with hypoxia (HCC)   . Acute metabolic encephalopathy 10/26/2019  . Malnutrition of moderate degree 10/22/2019  . Angio-edema 10/20/2019  . Chronic pancreatitis (HCC) 03/04/2016  . Essential hypertension, benign 10/06/2012    Resolved Hospital Problems  No resolved problems to display.    Discharge Condition: Improved, being discharged home  Diet recommendation: Low-sodium  Vitals:   10/25/19 2204 10/26/19 0508  BP: (!) 141/86 (!) 124/95  Pulse: 67 77  Resp: 20 18  Temp: 98.9 F (37.2 C) 98.8 F (37.1 C)  SpO2: 97% 97%    History of present illness:  59 year old male with past medical history of hypertension, previous alcohol abuse causing chronic pancreatitis admitted on 7/15 with angioedema after taking lisinopril for the first time the day prior.  Patient was intubated in the emergency room and admitted to the ICU under critical care service.  He was treated initially with IV Solu-Medrol, Benadryl and H2 blockers.  Able to be extubated on 7/18 and transferred to hospital service on 7/20.  Hospital Course:  Principal Problem: Angioedema causing acute respiratory failure with hypoxia (HCC) leading to mechanical ventilation: Extubated on 7/18.  Now breathing comfortably on room air.  Treated with IV Solu-Medrol, transition to 10-day prednisone taper upon  discharge. Active Problems:   Essential hypertension, benign: Blood pressure stable upon discharge.  Continue his Norvasc and Coreg.  ACE inhibitor obviously discontinued.  Will follow-up with his PCP to see if an additional blood pressure medication as needed.   Chronic pancreatitis (HCC): Stable during his hospitalization.    Malnutrition of moderate degree: Patient meets criteria in the context of chronic illness from his pancreatitis as evidenced by mild at disposition and severe muscle depletion.  Seen by nutrition.  Started on Ensure 3 times daily plus multivitamin.    Acute metabolic encephalopathy: Initially felt to be secondary to angioedema.  By time he was extubated, resolved.   Procedures:  Mechanical ventilation from 7/15-7/18.  Consultations:  Critical care  Discharge Exam: BP (!) 124/95 (BP Location: Right Arm)   Pulse 77   Temp 98.8 F (37.1 C)   Resp 18   Ht 5' 9.5" (1.765 m)   Wt 79.7 kg   SpO2 97%   BMI 25.56 kg/m   General: Alert and oriented x3, no acute distress Cardiovascular: Regular rate and rhythm, S1-S2 Respiratory: Clear to auscultation bilaterally  Discharge Instructions You were cared for by a hospitalist during your hospital stay. If you have any questions about your discharge medications or the care you received while you were in the hospital after you are discharged, you can call the unit and asked to speak with the hospitalist on call if the hospitalist that took care of you is not available. Once you are discharged, your primary care physician will handle any further medical issues. Please note that NO REFILLS for any discharge medications will be authorized once you are  discharged, as it is imperative that you return to your primary care physician (or establish a relationship with a primary care physician if you do not have one) for your aftercare needs so that they can reassess your need for medications and monitor your lab values.  Discharge  Instructions    Diet - low sodium heart healthy   Complete by: As directed    Increase activity slowly   Complete by: As directed      Allergies as of 10/26/2019      Reactions   Lisinopril Swelling   Angioedema      Medication List    STOP taking these medications   lisinopril 10 MG tablet Commonly known as: ZESTRIL     TAKE these medications   amLODipine 10 MG tablet Commonly known as: NORVASC Take 1 tablet (10 mg total) by mouth daily.   carvedilol 3.125 MG tablet Commonly known as: COREG Take 1 tablet (3.125 mg total) by mouth 2 (two) times daily with a meal.   predniSONE 10 MG tablet Commonly known as: DELTASONE Take 5 tablets (50 mg total) by mouth daily with breakfast for 2 days, THEN 4 tablets (40 mg total) daily with breakfast for 2 days, THEN 3 tablets (30 mg total) daily with breakfast for 2 days, THEN 2 tablets (20 mg total) daily with breakfast for 2 days, THEN 1 tablet (10 mg total) daily with breakfast for 2 days. Start taking on: October 26, 2019   ursodiol 500 MG tablet Commonly known as: ACTIGALL TAKE 1 TABLET BY MOUTH 3 (THREE) TIMES DAILY. What changed:   how much to take  how to take this  when to take this  additional instructions      Allergies  Allergen Reactions  . Lisinopril Swelling    Angioedema    Follow-up Information    Marcine Matar, MD Follow up in 2 week(s).   Specialty: Internal Medicine Contact information: [address] [place] [address] [phone]                The results of significant diagnostics from this hospitalization (including imaging, microbiology, ancillary and laboratory) are listed below for reference.    Significant Diagnostic Studies: DG Chest Port 1 View  Result Date: 10/20/2019 CLINICAL DATA:  Tube placement. EXAM: PORTABLE CHEST 1 VIEW COMPARISON:  None. FINDINGS: 1041 hours. Endotracheal tube tip is approximately 3.4 cm above the base of the carina. The NG tube passes into  the stomach although the distal tip position is not included on the film. Asymmetric elevation right hemidiaphragm. The lungs are clear without focal pneumonia, edema, pneumothorax or pleural effusion. Cardiopericardial silhouette is at upper limits of normal for size. The visualized bony structures of the thorax show now acute abnormality. Telemetry leads overlie the chest. IMPRESSION: Endotracheal tube tip is 3.4 cm above the base of the carina. Electronically Signed   By: Kennith Center M.D.   On: 10/20/2019 10:47   ECHOCARDIOGRAM COMPLETE  Result Date: 10/21/2019    ECHOCARDIOGRAM REPORT   Patient Name:   THATCHER DOBERSTEIN [facility] Date of Exam: 10/21/2019 Medical Rec #:  [id number]          Height:       69.5 in Accession #:    [id number]         Weight:       182.8 lb Date of Birth:  1960/10/23           BSA:  1.998 m Patient Age:    59 years           BP:           114/81 mmHg Patient Gender: M                  HR:           51 bpm. Exam Location:  Inpatient Procedure: 2D Echo Indications:    Acute Respiratory Insufficiency 518.82 / R06.89  History:        Patient has no prior history of Echocardiogram examinations.                 Risk Factors:Current Smoker and Hypertension. ETOH, Acute                 respiratory failure , Angio-edema.  Sonographer:    Jeryl Columbia Referring Phys: 22 MURALI RAMASWAMY IMPRESSIONS  1. Left ventricular ejection fraction, by estimation, is 60 to 65%. The left ventricle has normal function. The left ventricle has no regional wall motion abnormalities. There is mild left ventricular hypertrophy. Left ventricular diastolic parameters are consistent with Grade I diastolic dysfunction (impaired relaxation).  2. Right ventricular systolic function is normal. The right ventricular size is normal.  3. The mitral valve is normal in structure. No evidence of mitral valve regurgitation. No evidence of mitral stenosis.  4. The aortic valve is normal in structure. Aortic valve  regurgitation is not visualized. No aortic stenosis is present.  5. The inferior vena cava is normal in size with greater than 50% respiratory variability, suggesting right atrial pressure of 3 mmHg. FINDINGS  Left Ventricle: Left ventricular ejection fraction, by estimation, is 60 to 65%. The left ventricle has normal function. The left ventricle has no regional wall motion abnormalities. The left ventricular internal cavity size was normal in size. There is  mild left ventricular hypertrophy. Left ventricular diastolic parameters are consistent with Grade I diastolic dysfunction (impaired relaxation). Right Ventricle: The right ventricular size is normal. No increase in right ventricular wall thickness. Right ventricular systolic function is normal. Left Atrium: Left atrial size was normal in size. Right Atrium: Right atrial size was normal in size. Pericardium: There is no evidence of pericardial effusion. Mitral Valve: The mitral valve is normal in structure. Normal mobility of the mitral valve leaflets. No evidence of mitral valve regurgitation. No evidence of mitral valve stenosis. Tricuspid Valve: The tricuspid valve is normal in structure. Tricuspid valve regurgitation is not demonstrated. No evidence of tricuspid stenosis. Aortic Valve: The aortic valve is normal in structure. Aortic valve regurgitation is not visualized. No aortic stenosis is present. Pulmonic Valve: The pulmonic valve was normal in structure. Pulmonic valve regurgitation is not visualized. No evidence of pulmonic stenosis. Aorta: The aortic root is normal in size and structure. Venous: The inferior vena cava is normal in size with greater than 50% respiratory variability, suggesting right atrial pressure of 3 mmHg. IAS/Shunts: No atrial level shunt detected by color flow Doppler.  LEFT VENTRICLE PLAX 2D LVIDd:         4.20 cm  Diastology LVIDs:         2.80 cm  LV e' lateral:   9.90 cm/s LV PW:         1.20 cm  LV E/e' lateral: 3.1 LV  IVS:        1.10 cm  LV e' medial:    7.94 cm/s LVOT diam:     2.10 cm  LV  E/e' medial:  3.9 LVOT Area:     3.46 cm  RIGHT VENTRICLE RV S prime:     11.90 cm/s TAPSE (M-mode): 1.2 cm LEFT ATRIUM             Index       RIGHT ATRIUM           Index LA diam:        3.70 cm 1.85 cm/m  RA Area:     12.40 cm LA Vol (A2C):   28.7 ml 14.36 ml/m RA Volume:   30.30 ml  15.16 ml/m LA Vol (A4C):   37.8 ml 18.92 ml/m LA Biplane Vol: 33.2 ml 16.61 ml/m   AORTA Ao Root diam: 3.30 cm MITRAL VALVE MV Area (PHT): 2.07 cm    SHUNTS MV Decel Time: 366 msec    Systemic Diam: 2.10 cm MV E velocity: 30.60 cm/s MV A velocity: 31.50 cm/s MV E/A ratio:  0.97 Donato SchultzMark Skains MD Electronically signed by Donato SchultzMark Skains MD Signature Date/Time: 10/21/2019/11:47:37 AM    Final     Microbiology: Recent Results (from the past 240 hour(s))  SARS Coronavirus 2 by RT PCR (hospital order, performed in [facility] hospital lab) Nasopharyngeal Nasopharyngeal Swab     Status: None   Collection Time: 10/20/19 11:10 AM   Specimen: Nasopharyngeal Swab  Result Value Ref Range Status   SARS Coronavirus 2 NEGATIVE NEGATIVE Final    Comment: (NOTE) SARS-CoV-2 target nucleic acids are NOT DETECTED.  The SARS-CoV-2 RNA is generally detectable in upper and lower respiratory specimens during the acute phase of infection. The lowest concentration of SARS-CoV-2 viral copies this assay can detect is 250 copies / mL. A negative result does not preclude SARS-CoV-2 infection and should not be used as the sole basis for treatment or other patient management decisions.  A negative result may occur with improper specimen collection / handling, submission of specimen other than nasopharyngeal swab, presence of viral mutation(s) within the areas targeted by this assay, and inadequate number of viral copies (<250 copies / mL). A negative result must be combined with clinical observations, patient history, and epidemiological information.  Fact Sheet for  Patients:   BoilerBrush.com.cyhttps://www.fda.gov/media/136312/download  Fact Sheet for Healthcare Providers: https://pope.com/https://www.fda.gov/media/136313/download  This test is not yet approved or  cleared by the [place] FDA and has been authorized for detection and/or diagnosis of SARS-CoV-2 by FDA under an Emergency Use Authorization (EUA).  This EUA will remain in effect (meaning this test can be used) for the duration of the COVID-19 declaration under Section 564(b)(1) of the Act, 21 U.S.C. section 360bbb-3(b)(1), unless the authorization is terminated or revoked sooner.  Performed at [facility], [address] [place], [place] [address]   MRSA PCR Screening     Status: None   Collection Time: 10/20/19  1:25 PM   Specimen: Nasal Mucosa; Nasopharyngeal  Result Value Ref Range Status   MRSA by PCR NEGATIVE NEGATIVE Final    Comment:        The GeneXpert MRSA Assay (FDA approved for NASAL specimens only), is one component of a comprehensive MRSA colonization surveillance program. It is not intended to diagnose MRSA infection nor to guide or monitor treatment for MRSA infections. Performed at [facility], [address] [place], [place] [address]      Labs: Basic Metabolic Panel: Recent Labs  Lab 10/20/19 1710 10/21/19 0538 10/24/19 0404 10/25/19 0245 10/26/19 0211  NA 139 140 140 137 141  K 4.4 4.3 3.9 3.6 3.2*  CL 111 109 107 102 100  CO2 19* 23 21* 27 30  GLUCOSE 219* 140* 125* 156* 125*  BUN 7* 7* 16 16 23   CREATININE 0.83 1.06 1.06 0.90 0.95  CALCIUM 8.2* 8.2* 8.9 8.9 9.5  MG 1.8 2.4  --   --  2.4  PHOS 2.7 3.6  --   --   --    Liver Function Tests: Recent Labs  Lab 10/20/19 1710  AST 17  ALT 16  ALKPHOS 56  BILITOT 0.3  PROT 6.4*  ALBUMIN 2.8*   No results for input(s): LIPASE, AMYLASE in the last 168 hours. No results for input(s): AMMONIA in the last 168 hours. CBC: Recent Labs  Lab 10/20/19 1007 10/20/19 1351 10/20/19 1710 10/21/19 0538  10/26/19 0211  WBC 3.9*  --  3.9* 2.4* 7.9  NEUTROABS  --   --   --   --  4.5  HGB 15.9 13.3 14.1 13.4 15.7  HCT 46.1 39.0 40.0 39.4 44.3  MCV 90.7  --  89.7 93.6 88.2  PLT 172  --  166 171 167   Cardiac Enzymes: No results for input(s): CKTOTAL, CKMB, CKMBINDEX, TROPONINI in the last 168 hours. BNP: BNP (last 3 results) No results for input(s): BNP in the last 8760 hours.  ProBNP (last 3 results) No results for input(s): PROBNP in the last 8760 hours.  CBG: Recent Labs  Lab 10/23/19 0843 10/23/19 1139 10/23/19 1641 10/24/19 0736 10/24/19 1127  GLUCAP 119* 111* 120* 152* 145*       Signed:  10/26/19, MD [facility] 10/26/2019, 4:01 PM

## 2019-10-26 NOTE — Discharge Instructions (Signed)
Angioedema  Angioedema is sudden swelling in the body. The swelling can happen in any part of the body. It often happens on the skin and causes itchy, bumpy patches (hives) to form. This condition may:  Happen only one time.  Happen more than one time. It may come back at random times.  Keep coming back for a number of years. Someday it may stop coming back. Follow these instructions at home:  Take over-the-counter and prescription medicines only as told by your doctor.  If you were given medicines for emergency allergy treatment, always carry them with you.  Wear a medical bracelet as told by your doctor.  Avoid the things that cause your attacks (triggers).  If this condition was passed to you from your parents and you want to have kids, talk to your doctor. Your kids may also have this condition. Contact a doctor if:  You have another attack.  Your attacks happen more often, even after you take steps to prevent them.  This condition was passed to you by your parents and you want to have kids. Get help right away if:  Your mouth, tongue, or lips get very swollen.  You have trouble breathing.  You have trouble swallowing.  You pass out (faint). This information is not intended to replace advice given to you by your health care provider. Make sure you discuss any questions you have with your health care provider. Document Revised: 03/06/2017 Document Reviewed: 10/02/2015 Elsevier Patient Education  2020 Elsevier Inc.  

## 2019-10-27 ENCOUNTER — Telehealth: Payer: Self-pay | Admitting: *Deleted

## 2019-10-27 ENCOUNTER — Telehealth: Payer: Self-pay

## 2019-10-27 NOTE — Telephone Encounter (Signed)
Patient called back to speak to Erskine Squibb in regards to making appointment with Dr Laural Benes. He was not able to come in at 1.30 PM today and next visit is 11/25/19 but per patient and caregiver he need to be seen within the next week as he is just coming out the hospital. Please advise Ph# [phone]

## 2019-10-27 NOTE — Telephone Encounter (Signed)
Attempted to contact pt to complete transitions of care assessment; message states mailbox is for [employer]; message also states voicemail is full; unable to leave message.  Burnard Bunting, RN, BSN, CCRN [facility] Engagement Center [phone]

## 2019-10-27 NOTE — Telephone Encounter (Signed)
Transition Care Management Follow-up Telephone Call Date of discharge and from where: 10/26/2019, [facility]  Call placed to patient # [phone], voicemail states " Marco Pittman" who is also listed as patient's friend in contacts.  Voicemail full.  Call placed to patient's sister, [name], she said that the patient is not with her and she will give him the message to return the call to this CM # [phone].  He needs to schedule appointment with Dr Laural Benes

## 2019-10-27 NOTE — Telephone Encounter (Signed)
Transition Care Management Follow-up Telephone Call  Patient returned CM call. He gave permission for this CM to speak with Grafton Folk, who was with him    Date of discharge and from where: 10/26/2019. [facility]  How have you been since you were released from the hospital? The patient said that he is doing a little better he just doesn't want to talk very long.   Any questions or concerns? none at this time  Items Reviewed:  Did the pt receive and understand the discharge instructions provided? yes  Medications obtained and verified?  Deniece Portela said the he has all of his medications and did not have any questions. He stated that the list was reviewed before he was discharged from the hospital. He is aware of the prednisone taper orders  Any new allergies since your discharge?  lisinopril was the new allergy reported and the patient has disposed the lisinopril that he had at home   Do you have support at home? yes, he lives with [place].   No home health or DME ordered.   Functional Questionnaire: (I = Independent and D = Dependent) ADLs: independent per [facility]  Follow up appointments reviewed:   PCP Hospital f/u appt confirmed? Dr Laural Benes 11/14/2019  Specialist Hospital f/u appt confirmed?  none scheduled at this time.   Are transportation arrangements needed?  no  If their condition worsens, is the pt aware to call PCP or go to the Emergency Dept.?  yes  Was the patient provided with contact information for the PCP's office or ED?  he has the clinic phone number  Was to pt encouraged to call back with questions or concerns?  yes

## 2019-10-27 NOTE — Telephone Encounter (Signed)
Call returned to patient #  [phone], message left with call back requested to this CM # [phone]

## 2019-10-30 ENCOUNTER — Emergency Department (HOSPITAL_COMMUNITY): Payer: Medicaid Other

## 2019-10-30 ENCOUNTER — Other Ambulatory Visit: Payer: Self-pay

## 2019-10-30 ENCOUNTER — Inpatient Hospital Stay (HOSPITAL_COMMUNITY)
Admission: EM | Admit: 2019-10-30 | Discharge: 2019-11-05 | DRG: 166 | Disposition: A | Discharge status: Home / Self Care | Payer: Medicaid Other | Attending: Internal Medicine | Admitting: Internal Medicine

## 2019-10-30 DIAGNOSIS — R778 Other specified abnormalities of plasma proteins: Secondary | ICD-10-CM | POA: Diagnosis present

## 2019-10-30 DIAGNOSIS — J90 Pleural effusion, not elsewhere classified: Secondary | ICD-10-CM | POA: Diagnosis not present

## 2019-10-30 DIAGNOSIS — I2699 Other pulmonary embolism without acute cor pulmonale: Secondary | ICD-10-CM | POA: Diagnosis not present

## 2019-10-30 DIAGNOSIS — I160 Hypertensive urgency: Secondary | ICD-10-CM | POA: Diagnosis present

## 2019-10-30 DIAGNOSIS — Z7289 Other problems related to lifestyle: Secondary | ICD-10-CM

## 2019-10-30 DIAGNOSIS — R918 Other nonspecific abnormal finding of lung field: Secondary | ICD-10-CM | POA: Diagnosis not present

## 2019-10-30 DIAGNOSIS — F1010 Alcohol abuse, uncomplicated: Secondary | ICD-10-CM | POA: Diagnosis present

## 2019-10-30 DIAGNOSIS — E440 Moderate protein-calorie malnutrition: Secondary | ICD-10-CM | POA: Diagnosis present

## 2019-10-30 DIAGNOSIS — Z20822 Contact with and (suspected) exposure to covid-19: Secondary | ICD-10-CM | POA: Diagnosis present

## 2019-10-30 DIAGNOSIS — K861 Other chronic pancreatitis: Secondary | ICD-10-CM | POA: Diagnosis present

## 2019-10-30 DIAGNOSIS — T17890A Other foreign object in other parts of respiratory tract causing asphyxiation, initial encounter: Secondary | ICD-10-CM | POA: Diagnosis present

## 2019-10-30 DIAGNOSIS — Z888 Allergy status to other drugs, medicaments and biological substances status: Secondary | ICD-10-CM

## 2019-10-30 DIAGNOSIS — J189 Pneumonia, unspecified organism: Secondary | ICD-10-CM | POA: Diagnosis not present

## 2019-10-30 DIAGNOSIS — R739 Hyperglycemia, unspecified: Secondary | ICD-10-CM | POA: Diagnosis not present

## 2019-10-30 DIAGNOSIS — Z6822 Body mass index (BMI) 22.0-22.9, adult: Secondary | ICD-10-CM | POA: Diagnosis not present

## 2019-10-30 DIAGNOSIS — R0902 Hypoxemia: Secondary | ICD-10-CM | POA: Diagnosis not present

## 2019-10-30 DIAGNOSIS — E049 Nontoxic goiter, unspecified: Secondary | ICD-10-CM | POA: Diagnosis present

## 2019-10-30 DIAGNOSIS — Z978 Presence of other specified devices: Secondary | ICD-10-CM

## 2019-10-30 DIAGNOSIS — Z7141 Alcohol abuse counseling and surveillance of alcoholic: Secondary | ICD-10-CM | POA: Diagnosis not present

## 2019-10-30 DIAGNOSIS — G9341 Metabolic encephalopathy: Secondary | ICD-10-CM | POA: Diagnosis present

## 2019-10-30 DIAGNOSIS — Z590 Homelessness: Secondary | ICD-10-CM

## 2019-10-30 DIAGNOSIS — Z79899 Other long term (current) drug therapy: Secondary | ICD-10-CM | POA: Diagnosis not present

## 2019-10-30 DIAGNOSIS — J690 Pneumonitis due to inhalation of food and vomit: Secondary | ICD-10-CM | POA: Diagnosis present

## 2019-10-30 DIAGNOSIS — Z716 Tobacco abuse counseling: Secondary | ICD-10-CM | POA: Diagnosis not present

## 2019-10-30 DIAGNOSIS — I10 Essential (primary) hypertension: Secondary | ICD-10-CM | POA: Diagnosis present

## 2019-10-30 DIAGNOSIS — E876 Hypokalemia: Secondary | ICD-10-CM | POA: Diagnosis present

## 2019-10-30 DIAGNOSIS — J9601 Acute respiratory failure with hypoxia: Principal | ICD-10-CM | POA: Diagnosis present

## 2019-10-30 DIAGNOSIS — K743 Primary biliary cirrhosis: Secondary | ICD-10-CM | POA: Diagnosis present

## 2019-10-30 DIAGNOSIS — I2693 Single subsegmental pulmonary embolism without acute cor pulmonale: Secondary | ICD-10-CM | POA: Diagnosis present

## 2019-10-30 DIAGNOSIS — J9690 Respiratory failure, unspecified, unspecified whether with hypoxia or hypercapnia: Secondary | ICD-10-CM

## 2019-10-30 DIAGNOSIS — J9809 Other diseases of bronchus, not elsewhere classified: Secondary | ICD-10-CM | POA: Diagnosis not present

## 2019-10-30 DIAGNOSIS — J9811 Atelectasis: Secondary | ICD-10-CM | POA: Diagnosis not present

## 2019-10-30 DIAGNOSIS — F17290 Nicotine dependence, other tobacco product, uncomplicated: Secondary | ICD-10-CM | POA: Diagnosis present

## 2019-10-30 DIAGNOSIS — T380X5A Adverse effect of glucocorticoids and synthetic analogues, initial encounter: Secondary | ICD-10-CM | POA: Diagnosis not present

## 2019-10-30 DIAGNOSIS — I2694 Multiple subsegmental pulmonary emboli without acute cor pulmonale: Secondary | ICD-10-CM | POA: Diagnosis not present

## 2019-10-30 DIAGNOSIS — R451 Restlessness and agitation: Secondary | ICD-10-CM | POA: Diagnosis not present

## 2019-10-30 DIAGNOSIS — Z781 Physical restraint status: Secondary | ICD-10-CM

## 2019-10-30 DIAGNOSIS — R0602 Shortness of breath: Secondary | ICD-10-CM | POA: Diagnosis not present

## 2019-10-30 DIAGNOSIS — F1011 Alcohol abuse, in remission: Secondary | ICD-10-CM | POA: Diagnosis present

## 2019-10-30 LAB — RAPID URINE DRUG SCREEN, HOSP PERFORMED
Amphetamines: NOT DETECTED
Barbiturates: NOT DETECTED
Benzodiazepines: NOT DETECTED
Cocaine: NOT DETECTED
Opiates: NOT DETECTED
Tetrahydrocannabinol: POSITIVE — AB

## 2019-10-30 LAB — CBC
HCT: 45.7 % (ref 39.0–52.0)
Hemoglobin: 15.3 g/dL (ref 13.0–17.0)
MCH: 32 pg (ref 26.0–34.0)
MCHC: 33.5 g/dL (ref 30.0–36.0)
MCV: 95.6 fL (ref 80.0–100.0)
Platelets: 231 10*3/uL (ref 150–400)
RBC: 4.78 MIL/uL (ref 4.22–5.81)
RDW: 13.2 % (ref 11.5–15.5)
WBC: 13.1 10*3/uL — ABNORMAL HIGH (ref 4.0–10.5)
nRBC: 0 % (ref 0.0–0.2)

## 2019-10-30 LAB — BRAIN NATRIURETIC PEPTIDE: B Natriuretic Peptide: 91.7 pg/mL (ref 0.0–100.0)

## 2019-10-30 LAB — TROPONIN I (HIGH SENSITIVITY): Troponin I (High Sensitivity): 15 ng/L (ref <18)

## 2019-10-30 LAB — BASIC METABOLIC PANEL
Anion gap: 13 (ref 5–15)
BUN: 16 mg/dL (ref 8–23)
CO2: 24 mmol/L (ref 22–32)
Calcium: 8.8 mg/dL — ABNORMAL LOW (ref 8.9–10.3)
Chloride: 103 mmol/L (ref 98–111)
Creatinine, Ser: 1.02 mg/dL (ref 0.61–1.24)
GFR calc Af Amer: >60 mL/min (ref >60)
GFR calc non Af Amer: >60 mL/min (ref >60)
Glucose, Bld: 219 mg/dL — ABNORMAL HIGH (ref 70–99)
Potassium: 3 mmol/L — ABNORMAL LOW (ref 3.5–5.1)
Sodium: 140 mmol/L (ref 135–145)

## 2019-10-30 LAB — I-STAT ARTERIAL BLOOD GAS, ED
Acid-Base Excess: 2 mmol/L (ref 0.0–2.0)
Bicarbonate: 27.3 mmol/L (ref 20.0–28.0)
Calcium, Ion: 1.14 mmol/L — ABNORMAL LOW (ref 1.15–1.40)
HCT: 44 % (ref 39.0–52.0)
Hemoglobin: 15 g/dL (ref 13.0–17.0)
O2 Saturation: 98 %
Patient temperature: 98.6
Potassium: 3.1 mmol/L — ABNORMAL LOW (ref 3.5–5.1)
Sodium: 144 mmol/L (ref 135–145)
TCO2: 29 mmol/L (ref 22–32)
pCO2 arterial: 42.2 mmHg (ref 32.0–48.0)
pH, Arterial: 7.419 (ref 7.350–7.450)
pO2, Arterial: 112 mmHg — ABNORMAL HIGH (ref 83.0–108.0)

## 2019-10-30 LAB — CBG MONITORING, ED: Glucose-Capillary: 155 mg/dL — ABNORMAL HIGH (ref 70–99)

## 2019-10-30 LAB — SARS CORONAVIRUS 2 BY RT PCR (HOSPITAL ORDER, PERFORMED IN ~~LOC~~ HOSPITAL LAB): SARS Coronavirus 2: NEGATIVE

## 2019-10-30 LAB — ETHANOL: Alcohol, Ethyl (B): <10 mg/dL (ref <10)

## 2019-10-30 MED ORDER — SODIUM CHLORIDE 0.9 % IV SOLN
500.0000 mg | Freq: Once | INTRAVENOUS | Status: AC
  Administered 2019-10-30: 500 mg via INTRAVENOUS
  Filled 2019-10-30: qty 500

## 2019-10-30 MED ORDER — ALBUTEROL SULFATE (2.5 MG/3ML) 0.083% IN NEBU
5.0000 mg | INHALATION_SOLUTION | RESPIRATORY_TRACT | Status: DC | PRN
  Administered 2019-10-30: 5 mg via RESPIRATORY_TRACT
  Filled 2019-10-30: qty 6

## 2019-10-30 MED ORDER — ASPIRIN 81 MG PO CHEW
324.0000 mg | CHEWABLE_TABLET | Freq: Once | ORAL | Status: AC
  Administered 2019-10-30: 324 mg via ORAL
  Filled 2019-10-30: qty 4

## 2019-10-30 MED ORDER — NITROGLYCERIN IN D5W 200-5 MCG/ML-% IV SOLN
INTRAVENOUS | Status: AC
  Administered 2019-10-30: 5 ug/min via INTRAVENOUS
  Filled 2019-10-30: qty 250

## 2019-10-30 MED ORDER — SODIUM CHLORIDE 0.9 % IV SOLN
1.0000 g | Freq: Once | INTRAVENOUS | Status: AC
  Administered 2019-10-30: 1 g via INTRAVENOUS
  Filled 2019-10-30: qty 10

## 2019-10-30 MED ORDER — METHYLPREDNISOLONE SODIUM SUCC 125 MG IJ SOLR
125.0000 mg | Freq: Once | INTRAMUSCULAR | Status: AC
  Administered 2019-10-30: 125 mg via INTRAVENOUS
  Filled 2019-10-30: qty 2

## 2019-10-30 MED ORDER — NITROGLYCERIN IN D5W 200-5 MCG/ML-% IV SOLN
0.0000 ug/min | INTRAVENOUS | Status: DC
  Filled 2019-10-30 (×2): qty 250

## 2019-10-30 NOTE — ED Provider Notes (Signed)
[facility]   CSN: [id number] Arrival date & time: 10/30/19  2109     History Chief Complaint  Patient presents with  . Shortness of Breath    Marco SavoyMichael W Pittman is a 59 y.o. male.  HPI   Patient presented to the emergency room for evaluation of shortness of breath.  Patient states he had sudden onset of dyspnea this evening.  Patient states normally he does not have any breathing problems.  He felt extremely short of breath this evening.  He started become very diaphoretic.  Patient denies choking on anything.  He denies any trouble with fevers or chills.  He has not noticed any leg swelling.  Past Medical History:  Diagnosis Date  . Aneurysm (HCC)   . ETOH abuse   . Hypertension   . Pancreatitis 03/04/2016  . Umbilical hernia    symptomatic umbilical hernia/notes 03/04/2016    Patient Active Problem List   Diagnosis Date Noted  . Acute metabolic encephalopathy 10/26/2019  . Malnutrition of moderate degree 10/22/2019  . Angio-edema 10/20/2019  . Acute respiratory failure with hypoxia (HCC)   . Former smoker 06/16/2017  . 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 Sessile colonic polyp 01/29/2017  . Pre-diabetes 01/27/2017  . Chronic left shoulder pain 01/27/2017  . Weight loss, unintentional 10/10/2016  . History of alcohol abuse 03/19/2016  . Chronic pancreatitis (HCC) 03/04/2016  . Essential hypertension, benign 10/06/2012    Past Surgical History:  Procedure Laterality Date  . INSERTION OF MESH N/A 04/14/2016   Procedure: INSERTION OF MESH;  Surgeon: Avel Peaceodd Rosenbower, MD;  Location: [facility];  Service: General;  Laterality: N/A;  . UMBILICAL HERNIA REPAIR N/A 04/14/2016   Procedure: UMBILICAL HERNIA REPAIR WITH MESH;  Surgeon: Avel Peaceodd Rosenbower, MD;  Location: [facility];  Service: General;  Laterality: N/A;       Family History  Problem Relation Age of Onset  . Cancer  Father [id number]       colon    Social History   Tobacco Use  . Smoking status: Current Every Day Smoker    Packs/day: 0.50    Years: 45.00    Pack years: 22.50    Types: Cigars  . Smokeless tobacco: Never Used  Substance Use Topics  . Alcohol use: Yes    Alcohol/week: 14.0 standard drinks    Types: 14 Cans of beer per week    Comment: last alcohol on 03/29/16  . Drug use: No    Home Medications Prior to Admission medications   Medication Sig Start Date End Date Taking? Authorizing Provider  amLODipine (NORVASC) 10 MG tablet Take 1 tablet (10 mg total) by mouth daily. 10/18/19   Marcine MatarJohnson, Deborah B, MD  carvedilol (COREG) 3.125 MG tablet Take 1 tablet (3.125 mg total) by mouth 2 (two) times daily with a meal. 10/20/19   Marcine MatarJohnson, Deborah B, MD  predniSONE (DELTASONE) 10 MG tablet Take 5 tablets (50 mg total) by mouth daily with breakfast for 2 days, THEN 4 tablets (40 mg total) daily with breakfast for 2 days, THEN 3 tablets (30 mg total) daily with breakfast for 2 days, THEN 2 tablets (20 mg total) daily with breakfast for 2 days, THEN 1 tablet (10 mg total) daily with breakfast for 2 days. 10/26/19 11/05/19  Hollice EspyKrishnan, Sendil K, MD  ursodiol (ACTIGALL) 500 MG tablet TAKE 1 TABLET BY MOUTH 3 (THREE) TIMES DAILY. Patient taking differently: Take 500 mg by mouth  3 (three) times daily.  10/18/19   Marcine Matar, MD    Allergies    Lisinopril  Review of Systems   Review of Systems  All other systems reviewed and are negative.   Physical Exam Updated Vital Signs BP 106/81   Pulse (!) 138   Temp 98.6 F (37 C) (Oral)   Resp (!) 30   SpO2 95%   Physical Exam Vitals and nursing note reviewed.  Constitutional:      General: He is in acute distress.     Appearance: He is well-developed. He is ill-appearing.  HENT:     Head: Normocephalic and atraumatic.     Right Ear: External ear normal.     Left Ear: External ear normal.  Eyes:     General: No scleral icterus.       Right  eye: No discharge.        Left eye: No discharge.     Conjunctiva/sclera: Conjunctivae normal.  Neck:     Trachea: No tracheal deviation.  Cardiovascular:     Rate and Rhythm: Normal rate and regular rhythm.  Pulmonary:     Effort: Tachypnea, accessory muscle usage and respiratory distress present.     Breath sounds: No stridor. Wheezing and rales present.  Abdominal:     General: Bowel sounds are normal. There is no distension.     Palpations: Abdomen is soft.     Tenderness: There is no abdominal tenderness. There is no guarding or rebound.  Musculoskeletal:        General: No tenderness.     Cervical back: Neck supple.  Skin:    General: Skin is warm.     Findings: No rash.     Comments: Diaphoretic  Neurological:     Mental Status: He is alert.     Cranial Nerves: No cranial nerve deficit (no facial droop, extraocular movements intact, no slurred speech).     Sensory: No sensory deficit.     Motor: No abnormal muscle tone or seizure activity.     Coordination: Coordination normal.     ED Results / Procedures / Treatments   Labs (all labs ordered are listed, but only abnormal results are displayed) Labs Reviewed  BASIC METABOLIC PANEL - Abnormal; Notable for the following components:      Result Value   Potassium 3.0 (*)    Glucose, Bld 219 (*)    Calcium 8.8 (*)    All other components within normal limits  CBC - Abnormal; Notable for the following components:   WBC 13.1 (*)    All other components within normal limits  RAPID URINE DRUG SCREEN, HOSP PERFORMED - Abnormal; Notable for the following components:   Tetrahydrocannabinol POSITIVE (*)    All other components within normal limits  CBG MONITORING, ED - Abnormal; Notable for the following components:   Glucose-Capillary 155 (*)    All other components within normal limits  SARS CORONAVIRUS 2 BY RT PCR (HOSPITAL ORDER, PERFORMED IN [facility])  BRAIN NATRIURETIC PEPTIDE  ETHANOL  D-DIMER,  QUANTITATIVE (NOT AT [facility])  I-STAT ARTERIAL BLOOD GAS, ED  TROPONIN I (HIGH SENSITIVITY)  TROPONIN I (HIGH SENSITIVITY)    EKG EKG Interpretation  Date/Time:  Sunday October 30 2019 21:24:10 EDT Ventricular Rate:  92 PR Interval:    QRS Duration: 91 QT Interval:  371 QTC Calculation: 459 R Axis:   15 Text Interpretation: Sinus rhythm Probable left atrial enlargement Repol abnrm, severe global ischemia (  LM/MVD), new since last tracing Confirmed by Linwood Dibbles [phone]) on 10/30/2019 11:13:55 PM   Radiology DG Chest Portable 1 View  Result Date: 10/30/2019 CLINICAL DATA:  Shortness of breath EXAM: PORTABLE CHEST 1 VIEW COMPARISON:  October 20, 2019 FINDINGS: There are hazy airspace opacities at the lung bases bilaterally which are new since prior study. There is no pneumothorax. There may be a small right-sided pleural effusion. The heart size is stable. There is no acute osseous abnormality. IMPRESSION: 1. New hazy bibasilar airspace opacities concerning for pneumonia or atelectasis. 2. Possible small right-sided pleural effusion. Electronically Signed   By: Katherine Mantle M.D.   On: 10/30/2019 21:35    Procedures Procedures (including critical care time)  Medications Ordered in ED Medications  nitroGLYCERIN 50 mg in dextrose 5 % 250 mL (0.2 mg/mL) infusion (75 mcg/min Intravenous Rate/Dose Change 10/30/19 2302)  albuterol (PROVENTIL) (2.5 MG/3ML) 0.083% nebulizer solution 5 mg (5 mg Nebulization Given 10/30/19 2135)  cefTRIAXone (ROCEPHIN) 1 g in sodium chloride 0.9 % 100 mL IVPB (1 g Intravenous New Bag/Given 10/30/19 2249)  azithromycin (ZITHROMAX) 500 mg in sodium chloride 0.9 % 250 mL IVPB (has no administration in time range)  methylPREDNISolone sodium succinate (SOLU-MEDROL) 125 mg/2 mL injection 125 mg (125 mg Intravenous Given 10/30/19 2133)  aspirin chewable tablet 324 mg (324 mg Oral Given 10/30/19 2131)    ED Course  I have reviewed the triage vital signs and the nursing  notes.  Pertinent labs & imaging results that were available during my care of the patient were reviewed by me and considered in my medical decision making (see chart for details).  Clinical Course as of Oct 29 2316  Wynelle Link Oct 30, 2019  2229 Blood pressure is improving with nitroglycerin drip.  Patient is comfortable on BiPAP   [JK]  2229 Chest x-ray shows hazy opacities concerning for pneumonia or atelectasis.   [JK]  2313 White blood cell count is elevated.  Troponin is normal.  BNP is not elevated.  Chest x-ray suggesting possible pneumonia.   [JK]  2314 Covid test is pending.  Patient is oxygenating better now after BiPAP.  He also has responded to nitroglycerin drip for his hypertension.   [JK]    Clinical Course User Index [JK] Linwood Dibbles, MD   MDM Rules/Calculators/A&P                          Patient presents with acute respiratory distress.  Afebrile.  He is hypertensive and tachypneic.  Initial saturation is 55%.  It has improved into the mid 90s on nonrebreather.  Rapid presentation is suggestive for the possibility of CHF exacerbation.  COPD, pneumonia, PE, and Covid also a consideration.  We will start him on a nitroglycerin drip.  Give him Solu-Medrol.  Start BiPAP for comfort  Patient's breathing has improved.  Chest x-ray is suggestive of pneumonia.  Presentation somewhat atypical with the rapid onset.  Was concerned of possible CHF but his BNP is not elevated.  Patient was hypertensive but that has improved with nitroglycerin.  We could consider tapering that back as he does not appear to have a CHF exacerbation.  We will add on a D-dimer.  I have started patient on antibiotics to cover for pneumonia.  Covid test is pending.  Will add on an ABG.  I will consult the hospitalist for admission. Final Clinical Impression(s) / ED Diagnoses Final diagnoses:  Healthcare-associated pneumonia  Acute hypoxemic respiratory failure (  HCC)     Linwood Dibbles, MD 10/30/19 2318

## 2019-10-30 NOTE — ED Triage Notes (Signed)
Pt arrives POV, c/o sudden onset shortness of breath. Pt diaphoretic, SpO2 53% with good pleth. Pt speaking in one word sentences.

## 2019-10-31 ENCOUNTER — Encounter (HOSPITAL_COMMUNITY): Payer: Self-pay | Admitting: Internal Medicine

## 2019-10-31 ENCOUNTER — Inpatient Hospital Stay (HOSPITAL_COMMUNITY): Payer: Medicaid Other

## 2019-10-31 DIAGNOSIS — R0602 Shortness of breath: Secondary | ICD-10-CM

## 2019-10-31 DIAGNOSIS — J9809 Other diseases of bronchus, not elsewhere classified: Secondary | ICD-10-CM

## 2019-10-31 DIAGNOSIS — J9601 Acute respiratory failure with hypoxia: Principal | ICD-10-CM

## 2019-10-31 DIAGNOSIS — I160 Hypertensive urgency: Secondary | ICD-10-CM

## 2019-10-31 DIAGNOSIS — J189 Pneumonia, unspecified organism: Secondary | ICD-10-CM

## 2019-10-31 DIAGNOSIS — I2694 Multiple subsegmental pulmonary emboli without acute cor pulmonale: Secondary | ICD-10-CM

## 2019-10-31 DIAGNOSIS — T17500A Unspecified foreign body in bronchus causing asphyxiation, initial encounter: Secondary | ICD-10-CM | POA: Insufficient documentation

## 2019-10-31 LAB — I-STAT ARTERIAL BLOOD GAS, ED
Acid-Base Excess: 6 mmol/L — ABNORMAL HIGH (ref 0.0–2.0)
Bicarbonate: 31.7 mmol/L — ABNORMAL HIGH (ref 20.0–28.0)
Calcium, Ion: 1.19 mmol/L (ref 1.15–1.40)
HCT: 39 % (ref 39.0–52.0)
Hemoglobin: 13.3 g/dL (ref 13.0–17.0)
O2 Saturation: 100 %
Potassium: 4 mmol/L (ref 3.5–5.1)
Sodium: 146 mmol/L — ABNORMAL HIGH (ref 135–145)
TCO2: 33 mmol/L — ABNORMAL HIGH (ref 22–32)
pCO2 arterial: 49.7 mmHg — ABNORMAL HIGH (ref 32.0–48.0)
pH, Arterial: 7.412 (ref 7.350–7.450)
pO2, Arterial: 298 mmHg — ABNORMAL HIGH (ref 83.0–108.0)

## 2019-10-31 LAB — HEPARIN LEVEL (UNFRACTIONATED)
Heparin Unfractionated: 0.46 IU/mL (ref 0.30–0.70)
Heparin Unfractionated: 0.25 IU/mL — ABNORMAL LOW (ref 0.30–0.70)

## 2019-10-31 LAB — COMPREHENSIVE METABOLIC PANEL
ALT: 65 U/L — ABNORMAL HIGH (ref 0–44)
AST: 52 U/L — ABNORMAL HIGH (ref 15–41)
Albumin: 2.9 g/dL — ABNORMAL LOW (ref 3.5–5.0)
Alkaline Phosphatase: 75 U/L (ref 38–126)
Anion gap: 12 (ref 5–15)
BUN: 19 mg/dL (ref 8–23)
CO2: 25 mmol/L (ref 22–32)
Calcium: 8.6 mg/dL — ABNORMAL LOW (ref 8.9–10.3)
Chloride: 104 mmol/L (ref 98–111)
Creatinine, Ser: 0.92 mg/dL (ref 0.61–1.24)
GFR calc Af Amer: >60 mL/min (ref >60)
GFR calc non Af Amer: >60 mL/min (ref >60)
Glucose, Bld: 190 mg/dL — ABNORMAL HIGH (ref 70–99)
Potassium: 3.5 mmol/L (ref 3.5–5.1)
Sodium: 141 mmol/L (ref 135–145)
Total Bilirubin: 0.9 mg/dL (ref 0.3–1.2)
Total Protein: 7.2 g/dL (ref 6.5–8.1)

## 2019-10-31 LAB — ECHOCARDIOGRAM LIMITED: S' Lateral: 3.2 cm

## 2019-10-31 LAB — PROCALCITONIN: Procalcitonin: 0.77 ng/mL

## 2019-10-31 LAB — CBC
HCT: 41.4 % (ref 39.0–52.0)
HCT: 43.9 % (ref 39.0–52.0)
Hemoglobin: 13.9 g/dL (ref 13.0–17.0)
Hemoglobin: 14.6 g/dL (ref 13.0–17.0)
MCH: 30.7 pg (ref 26.0–34.0)
MCH: 31.3 pg (ref 26.0–34.0)
MCHC: 33.6 g/dL (ref 30.0–36.0)
MCHC: 33.3 g/dL (ref 30.0–36.0)
MCV: 91.4 fL (ref 80.0–100.0)
MCV: 94.2 fL (ref 80.0–100.0)
Platelets: 213 10*3/uL (ref 150–400)
Platelets: 243 10*3/uL (ref 150–400)
RBC: 4.53 MIL/uL (ref 4.22–5.81)
RBC: 4.66 MIL/uL (ref 4.22–5.81)
RDW: 13 % (ref 11.5–15.5)
RDW: 13.2 % (ref 11.5–15.5)
WBC: 16.2 10*3/uL — ABNORMAL HIGH (ref 4.0–10.5)
WBC: 15.5 10*3/uL — ABNORMAL HIGH (ref 4.0–10.5)
nRBC: 0 % (ref 0.0–0.2)
nRBC: 0 % (ref 0.0–0.2)

## 2019-10-31 LAB — STREP PNEUMONIAE URINARY ANTIGEN: Strep Pneumo Urinary Antigen: NEGATIVE

## 2019-10-31 LAB — TROPONIN I (HIGH SENSITIVITY)
Troponin I (High Sensitivity): 736 ng/L (ref <18)
Troponin I (High Sensitivity): 454 ng/L (ref <18)
Troponin I (High Sensitivity): 516 ng/L (ref <18)

## 2019-10-31 LAB — HIV ANTIBODY (ROUTINE TESTING W REFLEX): HIV Screen 4th Generation wRfx: NONREACTIVE

## 2019-10-31 LAB — CREATININE, SERUM
Creatinine, Ser: 0.94 mg/dL (ref 0.61–1.24)
GFR calc Af Amer: >60 mL/min (ref >60)
GFR calc non Af Amer: >60 mL/min (ref >60)

## 2019-10-31 LAB — GLUCOSE, CAPILLARY
Glucose-Capillary: 105 mg/dL — ABNORMAL HIGH (ref 70–99)
Glucose-Capillary: 111 mg/dL — ABNORMAL HIGH (ref 70–99)

## 2019-10-31 LAB — TSH: TSH: 0.76 u[IU]/mL (ref 0.350–4.500)

## 2019-10-31 LAB — MAGNESIUM: Magnesium: 2.4 mg/dL (ref 1.7–2.4)

## 2019-10-31 LAB — D-DIMER, QUANTITATIVE: D-Dimer, Quant: 3.17 ug/mL-FEU — ABNORMAL HIGH (ref 0.00–0.50)

## 2019-10-31 MED ORDER — IPRATROPIUM BROMIDE 0.02 % IN SOLN
0.5000 mg | RESPIRATORY_TRACT | Status: DC
  Administered 2019-10-31 (×7): 0.5 mg via RESPIRATORY_TRACT
  Filled 2019-10-31 (×8): qty 2.5

## 2019-10-31 MED ORDER — FENTANYL BOLUS VIA INFUSION
50.0000 ug | INTRAVENOUS | Status: DC | PRN
  Administered 2019-10-31: 50 ug via INTRAVENOUS
  Filled 2019-10-31: qty 50

## 2019-10-31 MED ORDER — FENTANYL CITRATE (PF) 100 MCG/2ML IJ SOLN: 100.0000 ug | Freq: Once | INTRAMUSCULAR | Status: DC

## 2019-10-31 MED ORDER — MIDAZOLAM HCL 2 MG/2ML IJ SOLN
2.0000 mg | Freq: Once | INTRAMUSCULAR | Status: AC
  Administered 2019-10-31: 2 mg via INTRAVENOUS
  Filled 2019-10-31: qty 2

## 2019-10-31 MED ORDER — FENTANYL 2500MCG IN NS 250ML (10MCG/ML) PREMIX INFUSION
50.0000 ug/h | INTRAVENOUS | Status: DC
  Administered 2019-10-31: 50 ug/h via INTRAVENOUS
  Administered 2019-11-01: 200 ug/h via INTRAVENOUS
  Filled 2019-10-31: qty 250

## 2019-10-31 MED ORDER — SODIUM CHLORIDE 0.9 % IV SOLN: 500.0000 mg | INTRAVENOUS | Status: DC

## 2019-10-31 MED ORDER — FAMOTIDINE IN NACL 20-0.9 MG/50ML-% IV SOLN
20.0000 mg | Freq: Two times a day (BID) | INTRAVENOUS | Status: DC
  Administered 2019-10-31 – 2019-11-01 (×2): 20 mg via INTRAVENOUS
  Filled 2019-10-31 (×3): qty 50

## 2019-10-31 MED ORDER — ONDANSETRON HCL 4 MG PO TABS: 4.0000 mg | ORAL_TABLET | Freq: Four times a day (QID) | ORAL | Status: DC | PRN

## 2019-10-31 MED ORDER — THIAMINE HCL 100 MG PO TABS
100.0000 mg | ORAL_TABLET | Freq: Every day | ORAL | Status: DC
  Filled 2019-10-31: qty 1

## 2019-10-31 MED ORDER — DOCUSATE SODIUM 50 MG/5ML PO LIQD
100.0000 mg | Freq: Two times a day (BID) | ORAL | Status: DC
  Administered 2019-11-01: 100 mg
  Filled 2019-10-31 (×2): qty 10

## 2019-10-31 MED ORDER — VANCOMYCIN HCL IN DEXTROSE 1-5 GM/200ML-% IV SOLN
1000.0000 mg | Freq: Two times a day (BID) | INTRAVENOUS | Status: DC
  Administered 2019-10-31 – 2019-11-03 (×6): 1000 mg via INTRAVENOUS
  Filled 2019-10-31 (×6): qty 200

## 2019-10-31 MED ORDER — CHLORHEXIDINE GLUCONATE 0.12% ORAL RINSE (MEDLINE KIT)
15.0000 mL | Freq: Two times a day (BID) | OROMUCOSAL | Status: DC
  Administered 2019-10-31 – 2019-11-03 (×6): 15 mL via OROMUCOSAL

## 2019-10-31 MED ORDER — THIAMINE HCL 100 MG/ML IJ SOLN
100.0000 mg | Freq: Every day | INTRAMUSCULAR | Status: DC
  Administered 2019-10-31 – 2019-11-01 (×2): 100 mg via INTRAVENOUS
  Filled 2019-10-31 (×3): qty 2

## 2019-10-31 MED ORDER — POLYETHYLENE GLYCOL 3350 17 G PO PACK
17.0000 g | PACK | Freq: Every day | ORAL | Status: DC
  Administered 2019-11-01: 17 g
  Filled 2019-10-31: qty 1

## 2019-10-31 MED ORDER — ONDANSETRON HCL 4 MG/2ML IJ SOLN: 4.0000 mg | Freq: Four times a day (QID) | INTRAMUSCULAR | Status: DC | PRN

## 2019-10-31 MED ORDER — HEPARIN (PORCINE) 25000 UT/250ML-% IV SOLN
1650.0000 [IU]/h | INTRAVENOUS | Status: AC
  Administered 2019-10-31: 1350 [IU]/h via INTRAVENOUS
  Administered 2019-11-01: 1600 [IU]/h via INTRAVENOUS
  Administered 2019-11-01 – 2019-11-03 (×4): 1650 [IU]/h via INTRAVENOUS
  Filled 2019-10-31 (×8): qty 250

## 2019-10-31 MED ORDER — ORAL CARE MOUTH RINSE
15.0000 mL | OROMUCOSAL | Status: DC
  Administered 2019-10-31 – 2019-11-03 (×26): 15 mL via OROMUCOSAL

## 2019-10-31 MED ORDER — POTASSIUM CHLORIDE CRYS ER 20 MEQ PO TBCR
20.0000 meq | EXTENDED_RELEASE_TABLET | Freq: Once | ORAL | Status: AC
  Administered 2019-10-31: 20 meq via ORAL
  Filled 2019-10-31: qty 1

## 2019-10-31 MED ORDER — ROCURONIUM BROMIDE 50 MG/5ML IV SOLN: 80.0000 mg | Freq: Once | INTRAVENOUS | Status: DC

## 2019-10-31 MED ORDER — ETOMIDATE 2 MG/ML IV SOLN: 20.0000 mg | Freq: Once | INTRAVENOUS | Status: DC

## 2019-10-31 MED ORDER — ENOXAPARIN SODIUM 40 MG/0.4ML ~~LOC~~ SOLN: 40.0000 mg | SUBCUTANEOUS | Status: DC

## 2019-10-31 MED ORDER — ALBUTEROL SULFATE (2.5 MG/3ML) 0.083% IN NEBU
2.5000 mg | INHALATION_SOLUTION | RESPIRATORY_TRACT | Status: DC | PRN
  Administered 2019-11-03: 2.5 mg via RESPIRATORY_TRACT
  Filled 2019-10-31 (×2): qty 3

## 2019-10-31 MED ORDER — VANCOMYCIN HCL 1500 MG/300ML IV SOLN
1500.0000 mg | Freq: Once | INTRAVENOUS | Status: AC
  Administered 2019-10-31: 1500 mg via INTRAVENOUS
  Filled 2019-10-31: qty 300

## 2019-10-31 MED ORDER — HEPARIN BOLUS VIA INFUSION
5000.0000 [IU] | Freq: Once | INTRAVENOUS | Status: AC
  Administered 2019-10-31: 5000 [IU] via INTRAVENOUS
  Filled 2019-10-31: qty 5000

## 2019-10-31 MED ORDER — FOLIC ACID 1 MG PO TABS
1.0000 mg | ORAL_TABLET | Freq: Every day | ORAL | Status: DC
  Administered 2019-11-01: 1 mg via ORAL
  Filled 2019-10-31: qty 1

## 2019-10-31 MED ORDER — AMLODIPINE BESYLATE 10 MG PO TABS
10.0000 mg | ORAL_TABLET | Freq: Every day | ORAL | Status: DC
  Administered 2019-11-01: 10 mg via ORAL
  Filled 2019-10-31: qty 1

## 2019-10-31 MED ORDER — IOHEXOL 350 MG/ML SOLN
100.0000 mL | Freq: Once | INTRAVENOUS | Status: AC | PRN
  Administered 2019-10-31: 100 mL via INTRAVENOUS

## 2019-10-31 MED ORDER — ETOMIDATE 2 MG/ML IV SOLN
20.0000 mg | Freq: Once | INTRAVENOUS | Status: AC
  Administered 2019-10-31: 20 mg via INTRAVENOUS

## 2019-10-31 MED ORDER — PROPOFOL 1000 MG/100ML IV EMUL
0.0000 ug/kg/min | INTRAVENOUS | Status: DC
  Administered 2019-10-31: 20 ug/kg/min via INTRAVENOUS
  Administered 2019-10-31: 25 ug/kg/min via INTRAVENOUS
  Administered 2019-11-01: 30 ug/kg/min via INTRAVENOUS
  Filled 2019-10-31 (×3): qty 100

## 2019-10-31 MED ORDER — LORAZEPAM 1 MG PO TABS: 1.0000 mg | ORAL_TABLET | ORAL | Status: DC | PRN

## 2019-10-31 MED ORDER — PANTOPRAZOLE SODIUM 40 MG PO TBEC
40.0000 mg | DELAYED_RELEASE_TABLET | Freq: Every day | ORAL | Status: DC
  Administered 2019-11-01: 40 mg via ORAL
  Filled 2019-10-31: qty 1

## 2019-10-31 MED ORDER — CARVEDILOL 3.125 MG PO TABS
3.1250 mg | ORAL_TABLET | Freq: Two times a day (BID) | ORAL | Status: DC
  Administered 2019-11-01: 3.125 mg via ORAL
  Filled 2019-10-31: qty 1

## 2019-10-31 MED ORDER — LORAZEPAM 2 MG/ML IJ SOLN
1.0000 mg | INTRAMUSCULAR | Status: DC | PRN
  Administered 2019-10-31: 1 mg via INTRAVENOUS
  Administered 2019-11-01: 2 mg via INTRAVENOUS
  Filled 2019-10-31 (×2): qty 1

## 2019-10-31 MED ORDER — PIPERACILLIN-TAZOBACTAM 3.375 G IVPB 30 MIN
3.3750 g | Freq: Once | INTRAVENOUS | Status: AC
  Administered 2019-10-31: 3.375 g via INTRAVENOUS
  Filled 2019-10-31: qty 50

## 2019-10-31 MED ORDER — ADULT MULTIVITAMIN W/MINERALS CH: 1.0000 | ORAL_TABLET | Freq: Every day | ORAL | Status: DC

## 2019-10-31 MED ORDER — FENTANYL CITRATE (PF) 100 MCG/2ML IJ SOLN
100.0000 ug | Freq: Once | INTRAMUSCULAR | Status: AC
  Administered 2019-10-31: 100 ug via INTRAVENOUS
  Filled 2019-10-31: qty 2

## 2019-10-31 MED ORDER — HEPARIN BOLUS VIA INFUSION
1000.0000 [IU] | Freq: Once | INTRAVENOUS | Status: AC
  Administered 2019-10-31: 1000 [IU] via INTRAVENOUS
  Filled 2019-10-31: qty 1000

## 2019-10-31 MED ORDER — SODIUM CHLORIDE 0.9 % IV SOLN: 2.0000 g | INTRAVENOUS | Status: DC

## 2019-10-31 MED ORDER — ROCURONIUM BROMIDE 50 MG/5ML IV SOLN
80.0000 mg | Freq: Once | INTRAVENOUS | Status: AC
  Administered 2019-10-31: 80 mg via INTRAVENOUS
  Filled 2019-10-31: qty 8

## 2019-10-31 MED ORDER — URSODIOL 300 MG PO CAPS
600.0000 mg | ORAL_CAPSULE | Freq: Three times a day (TID) | ORAL | Status: DC
  Administered 2019-11-01: 600 mg via ORAL
  Filled 2019-10-31 (×8): qty 2

## 2019-10-31 MED ORDER — ALBUTEROL SULFATE (2.5 MG/3ML) 0.083% IN NEBU
2.5000 mg | INHALATION_SOLUTION | RESPIRATORY_TRACT | Status: DC
  Administered 2019-10-31 (×7): 2.5 mg via RESPIRATORY_TRACT
  Filled 2019-10-31 (×8): qty 3

## 2019-10-31 MED ORDER — MIDAZOLAM HCL 2 MG/2ML IJ SOLN: 2.0000 mg | Freq: Once | INTRAMUSCULAR | Status: DC

## 2019-10-31 MED ORDER — PIPERACILLIN-TAZOBACTAM 3.375 G IVPB
3.3750 g | Freq: Three times a day (TID) | INTRAVENOUS | Status: AC
  Administered 2019-10-31 – 2019-11-04 (×13): 3.375 g via INTRAVENOUS
  Filled 2019-10-31 (×13): qty 50

## 2019-10-31 NOTE — Progress Notes (Signed)
Pt transported from Ed to 2M room 8 without any complications.

## 2019-10-31 NOTE — ED Notes (Signed)
Critical Troponin 736 discussed with Dr. Toniann Fail. Informed pt has not reported CP. No new orders received. Cardiology to follow

## 2019-10-31 NOTE — ED Notes (Signed)
Pt intubated by CCM

## 2019-10-31 NOTE — Progress Notes (Addendum)
Paged to bedside to evaluate respiratory status  Patient has been maintained on BiPAP since time of admission (has physically remained in ED)\ RR 40s, with abdominal muscle recruitment Worsening hypoxia, now SpO2 88% on BiPAP Do not think there is utility in acquiring ABG. Appears fatigued and clinically feel that patient would be best served with advanced airway at this juncture  Acute respiratory failure with hypoxia -PNA, PE  P -Intubate in ED -ABG 1 hr after intubation -CXR to verify placement -fentanyl, propofol for sedation -Will continue to try to place bed in ICU from ED  -Pulm hygiene  -cont abx, heparin gtt    [name] MSN, AGACNP-BC [facility] [phone] If no answer, [phone] 10/31/2019, 4:14 PM    Patient seen in ED RESUS  Continuing to decompensate  BL rhonchi on exam   Patient failed BIPAP Worsening respiratory failure  Plan to intubate Bronch to remove RM debris seen on CT Continue heparin  Sedation with PAD guidelines after  This patient is critically ill with multiple organ system failure; which, requires frequent high complexity decision making, assessment, support, evaluation, and titration of therapies. This was completed through the application of advanced monitoring technologies and extensive interpretation of multiple databases. During this encounter critical care time was devoted to patient care services described in this note for 32 minutes.  Josephine Igo, DO [facility] 10/31/2019 4:59 PM

## 2019-10-31 NOTE — Progress Notes (Signed)
Echocardiogram 2D Echocardiogram has been performed.  Pieter Partridge 10/31/2019, 11:46 AM

## 2019-10-31 NOTE — Progress Notes (Signed)
Pharmacy Antibiotic Note  Marco Pittman is a 59 y.o. male admitted on 10/30/2019 with PE and pneumonia.  Pharmacy has been consulted for vancomycin and Zosyn dosing.  Plan: Vancomycin 1500mg  x1 then 1000mg  IV every 12 hours.  Goal trough 15-20 mcg/mL. Zosyn 3.375g IV every 8 hours (4-hour infusion).   Temp (24hrs), Avg:98.6 F (37 C), Min:98.6 F (37 C), Max:98.6 F (37 C)  Recent Labs  Lab 10/25/19 0245 10/26/19 0211 10/30/19 2124 10/31/19 0104 10/31/19 0421  WBC  --  7.9 13.1* 16.2*  --   CREATININE 0.90 0.95 1.02 0.94 0.92    Estimated Creatinine Clearance: 85.8 mL/min (by C-G formula based on SCr of 0.92 mg/dL).    Allergies  Allergen Reactions  . Lisinopril Swelling    Angioedema    Thank you for allowing pharmacy to be a part of this patient's care.  11/02/19, PharmD, BCPS  10/31/2019 7:04 AM

## 2019-10-31 NOTE — ED Notes (Signed)
Delay in blood draw, repeat trop to be collected at [facility]

## 2019-10-31 NOTE — Progress Notes (Signed)
eLink Physician-Brief Progress Note Patient Name: Marco Pittman DOB: 1960-11-01 MRN: [id number]   Date of Service  10/31/2019  HPI/Events of Note  Agitation - Request for bilateral soft wrist restraints.   eICU Interventions  Plan: 1. Bilateral soft wrist restraints X 8 hours.      Intervention Category Major Interventions: Delirium, psychosis, severe agitation - evaluation and management  [name] 10/31/2019, 11:21 PM

## 2019-10-31 NOTE — Progress Notes (Signed)
ANTICOAGULATION CONSULT NOTE - Initial Consult  Pharmacy Consult for heparin Indication: pulmonary embolus  Allergies  Allergen Reactions  . Lisinopril Swelling    Angioedema    Patient Measurements:   Heparin Dosing Weight: 75 kg  Vital Signs: Temp: 98.6 F (37 C) (07/25 2113) Temp Source: Oral (07/25 2113) BP: 106/79 (07/26 0230) Pulse Rate: 95 (07/26 0230)  Labs: Recent Labs    10/30/19 2124 10/30/19 2124 10/30/19 2345 10/31/19 0104  HGB 15.3   < > 15.0 13.9  HCT 45.7  --  44.0 41.4  PLT 231  --   --  213  CREATININE 1.02  --   --  0.94  TROPONINIHS 15  --   --  736*   < > = values in this interval not displayed.    Estimated Creatinine Clearance: 83.9 mL/min (by C-G formula based on SCr of 0.94 mg/dL).   Medical History: Past Medical History:  Diagnosis Date  . Aneurysm (HCC)   . ETOH abuse   . Hypertension   . Pancreatitis 03/04/2016  . Umbilical hernia    symptomatic umbilical hernia/notes 03/04/2016    Medications:  See medication history  Assessment: 59 yo man to start heparin for PE.  Hg 13.6, PTLC 213  He was not on anticoagulation PTA Goal of Therapy:  Heparin level 0.3-0.7 units/ml Monitor platelets by anticoagulation protocol: Yes   Plan:  Heparin 5000 unit bolus and drip at 1350 units/hr Check heparin level 6-8 hours after start Daily HL and CBC while on heparin Monitor for bleeding complications  Thanks for allowing pharmacy to be a part of this patient's care.  Talbert Cage, PharmD Clinical Pharmacist 10/31/2019,3:02 AM

## 2019-10-31 NOTE — ED Notes (Signed)
Pt transferred to hospital bed for comfort.

## 2019-10-31 NOTE — Progress Notes (Signed)
ANTICOAGULATION CONSULT NOTE  Pharmacy Consult for heparin Indication: pulmonary embolus  Allergies  Allergen Reactions   Lisinopril Swelling    Angioedema    Patient Measurements:   Heparin Dosing Weight: 75 kg  Vital Signs: BP: 157/95 (07/26 1230) Pulse Rate: 82 (07/26 1230)  Labs: Recent Labs    10/30/19 2124 10/30/19 2124 10/30/19 2345 10/30/19 2345 10/31/19 0104 10/31/19 0421 10/31/19 1043 10/31/19 1308  HGB 15.3   < > 15.0   < > 13.9  --   --  14.6  HCT 45.7   < > 44.0  --  41.4  --   --  43.9  PLT 231  --   --   --  213  --   --  243  HEPARINUNFRC  --   --   --   --   --   --  0.46  --   CREATININE 1.02  --   --   --  0.94 0.92  --   --   TROPONINIHS 15  --   --   --  736*  --   --  454*   < > = values in this interval not displayed.    Estimated Creatinine Clearance: 85.8 mL/min (by C-G formula based on SCr of 0.92 mg/dL).   Medical History: Past Medical History:  Diagnosis Date   Aneurysm (HCC)    ETOH abuse    Hypertension    Pancreatitis 03/04/2016   Umbilical hernia    symptomatic umbilical hernia/notes 03/04/2016    Assessment: 59 yom admitted with acute PE, no RHS. Pharmacy consulted to dose heparin. Patient is not on anticoagulation PTA.  Initial heparin level therapeutic at 0.46. CBC wnl. No active bleed issues reported.  Goal of Therapy:  Heparin level 0.3-0.7 units/ml Monitor platelets by anticoagulation protocol: Yes   Plan:  Continue heparin IV at 1350 units/hr Check 6hr heparin level confirm Monitor daily heparin level and CBC, s/sx bleeding   Leia Alf, PharmD, BCPS Please check AMION for all [facility] Pharmacy contact numbers Clinical Pharmacist 10/31/2019 2:12 PM

## 2019-10-31 NOTE — Progress Notes (Signed)
ANTICOAGULATION CONSULT NOTE  Pharmacy Consult for heparin Indication: pulmonary embolus  Allergies  Allergen Reactions  . Lisinopril Swelling    Angioedema    Patient Measurements: Weight: 69.1 kg (152 lb 5.4 oz) Heparin Dosing Weight: 75 kg  Vital Signs: BP: 91/68 (07/26 2215) Pulse Rate: 83 (07/26 2215)  Labs: Recent Labs    10/30/19 2124 10/30/19 2345 10/31/19 0104 10/31/19 0104 10/31/19 0421 10/31/19 1043 10/31/19 1308 10/31/19 1501 10/31/19 1736 10/31/19 2154  HGB 15.3   < > 13.9   < >  --   --  14.6  --  13.3  --   HCT 45.7   < > 41.4  --   --   --  43.9  --  39.0  --   PLT 231  --  213  --   --   --  243  --   --   --   HEPARINUNFRC  --   --   --   --   --  0.46  --   --   --  0.25*  CREATININE 1.02  --  0.94  --  0.92  --   --   --   --   --   TROPONINIHS 15   < > 736*  --   --   --  454* 516*  --   --    < > = values in this interval not displayed.    Estimated Creatinine Clearance: 82.4 mL/min (by C-G formula based on SCr of 0.92 mg/dL).  Assessment: 46 yom admitted with acute PE, no RHS. Pharmacy consulted to dose heparin. Patient is not on anticoagulation PTA.  Hep lvl now low 0.25; now intubated  Goal of Therapy:  Heparin level 0.3-0.7 units/ml Monitor platelets by anticoagulation protocol: Yes   Plan:  Bolus heparin 1000 units x 1 Increase gtt to 1450 units/hr Recheck lvls in am  Elmer Sow, PharmD, BCPS, BCCCP Clinical Pharmacist [phone]  Please check AMION for all [facility] Pharmacy numbers  10/31/2019 10:27 PM

## 2019-10-31 NOTE — Consult Note (Signed)
Cardiology Consultation:   Patient ID: Marco SavoyMichael W Pittman MRN: [id number]; DOB: 06/24/1960  Admit date: 10/30/2019 Date of Consult: 10/31/2019  Primary Care Provider: Marcine MatarJohnson, Deborah B, MD [facility] Cardiologist: [facility] (Dr. Cristal Deerhristopher) [facility] Electrophysiologist:  None    Patient Profile:   Marco Pittman is a 59 y.o. male with a history of hypertension, chronic pancreatitis, primary biliary cirrhosis, and tobacco/alcohol abuse who is being seen today for the evaluation of elevated troponin at the request of Dr. Delton CoombesByrum.  History of Present Illness:   [name] is a 59 year old male with the above history. He has no known cardiac history and has never had any type of cardiac work-up. He is a current tobacco user and reports a long history of smoking. Currently smokes about 1 pack per week. No known family history of heart disease.  Patient recently admitted from 10/20/2019 to 10/26/2019 for angioedema after taking Lisinopril for the first time. Patient was intubated in the ED and admitted to ICU. Treated with IV Solu-Medrol, Benadryl, and H2 blockers. He was able to be extubated on 7/18 and was then discharged on 10-day prednisone taper.   Patient presented to the ED yesterday for further evaluation of shortness of breath. Upon arrival to the ED, patient hypertensive, tachycardic, and tachypneic. O2 sats 55% on room air. Required BiPAP. EKG showed normal sinus rhythm with ST depressions in inferior leads as well as ST depression and biphasic T waves in V3-V5. High-sensitivity troponin 15 >> 736. Chest x-ray showed new hazy bibasilar airspace opacities concerning for pneumonia or atelectasis as well as a possible small right-sided pleural effusion. D-dimer elevated at 3.17. Chest CTA showed bilateral segmental and subsegmental pulmonary emboli involving the lower lobes as well as findings concerning for aspiration pneumonia. WBC 13.1, Hgb 15.3, Plts 231. Na 140, K  3.0, Glucose 219, BUN 16, Cr 1.02. UDS positive for THC. Ethanol <10. COVID-19 negative. Patient admitted for further management of acute hypoxic respiratory failure secondary to pneumonia and PE.   Cardiology consulted for further evaluation of elevated troponin. At the time of this evaluation, patient still on BiPAP. He reports sudden onset of shortness of breath yesterday. He was unable to sleep due to this. No chest pain. No syncope. No lower extremity edema. He notes a productive cough but denies any any fevers or hemoptysis. No GI symptoms. No abnormal bleeding in urine or stools. Unable to get more detailed history due to patient being on BiPAP.  Past Medical History:  Diagnosis Date  . Aneurysm (HCC)   . ETOH abuse   . Hypertension   . Pancreatitis 03/04/2016  . Umbilical hernia    symptomatic umbilical hernia/notes 03/04/2016    Past Surgical History:  Procedure Laterality Date  . INSERTION OF MESH N/A 04/14/2016   Procedure: INSERTION OF MESH;  Surgeon: Avel Peaceodd Rosenbower, MD;  Location: [facility];  Service: General;  Laterality: N/A;  . UMBILICAL HERNIA REPAIR N/A 04/14/2016   Procedure: UMBILICAL HERNIA REPAIR WITH MESH;  Surgeon: Avel Peaceodd Rosenbower, MD;  Location: [facility];  Service: General;  Laterality: N/A;     Home Medications:  Prior to Admission medications   Medication Sig Start Date End Date Taking? Authorizing Provider  amLODipine (NORVASC) 10 MG tablet Take 1 tablet (10 mg total) by mouth daily. 10/18/19  Yes Marcine MatarJohnson, Deborah B, MD  carvedilol (COREG) 3.125 MG tablet Take 1 tablet (3.125 mg total) by mouth 2 (two) times daily with a meal.  10/20/19  Yes Marcine Matar, MD  ursodiol (ACTIGALL) 500 MG tablet TAKE 1 TABLET BY MOUTH 3 (THREE) TIMES DAILY. Patient taking differently: Take 500 mg by mouth 3 (three) times daily.  10/18/19  Yes Marcine Matar, MD  predniSONE (DELTASONE) 10 MG tablet Take 5 tablets (50 mg total) by mouth daily  with breakfast for 2 days, THEN 4 tablets (40 mg total) daily with breakfast for 2 days, THEN 3 tablets (30 mg total) daily with breakfast for 2 days, THEN 2 tablets (20 mg total) daily with breakfast for 2 days, THEN 1 tablet (10 mg total) daily with breakfast for 2 days. 10/26/19 11/05/19  Hollice Espy, MD    Inpatient Medications: Scheduled Meds: . albuterol  2.5 mg Nebulization Q4H  . amLODipine  10 mg Oral Daily  . carvedilol  3.125 mg Oral BID WC  . folic acid  1 mg Oral Daily  . ipratropium  0.5 mg Nebulization Q4H  . multivitamin with minerals  1 tablet Oral Daily  . pantoprazole  40 mg Oral Daily  . thiamine  100 mg Oral Daily   Or  . thiamine  100 mg Intravenous Daily  . ursodiol  600 mg Oral TID   Continuous Infusions: . heparin 1,350 Units/hr (10/31/19 0311)  . nitroGLYCERIN 95 mcg/min (10/31/19 0825)  . piperacillin-tazobactam (ZOSYN)  IV    . vancomycin     PRN Meds: albuterol, LORazepam **OR** LORazepam, ondansetron **OR** ondansetron (ZOFRAN) IV  Allergies:    Allergies  Allergen Reactions  . Lisinopril Swelling    Angioedema    Social History:   Social History   Socioeconomic History  . Marital status: Single    Spouse name: Not on file  . Number of children: Not on file  . Years of education: Not on file  . Highest education level: Not on file  Occupational History  . Not on file  Tobacco Use  . Smoking status: Current Every Day Smoker    Packs/day: 0.50    Years: 45.00    Pack years: 22.50    Types: Cigars  . Smokeless tobacco: Never Used  Substance and Sexual Activity  . Alcohol use: Yes    Alcohol/week: 14.0 standard drinks    Types: 14 Cans of beer per week    Comment: last alcohol on 03/29/16  . Drug use: No  . Sexual activity: Yes  Other Topics Concern  . Not on file  Social History Narrative  . Not on file   Social Determinants of Health   Financial Resource Strain:   . Difficulty of Paying Living Expenses:   Food  Insecurity:   . Worried About Programme researcher, broadcasting/film/video in the Last Year:   . Barista in the Last Year:   Transportation Needs:   . Freight forwarder (Medical):   Marland Kitchen Lack of Transportation (Non-Medical):   Physical Activity:   . Days of Exercise per Week:   . Minutes of Exercise per Session:   Stress:   . Feeling of Stress :   Social Connections:   . Frequency of Communication with Friends and Family:   . Frequency of Social Gatherings with Friends and Family:   . Attends Religious Services:   . Active Member of Clubs or Organizations:   . Attends Banker Meetings:   Marland Kitchen Marital Status:   Intimate Partner Violence:   . Fear of Current or Ex-Partner:   . Emotionally Abused:   .  Physically Abused:   . Sexually Abused:     Family History:    Family History  Problem Relation Age of Onset  . Cancer Father 47       colon     ROS:  Please see the history of present illness.  Unable to get full ROS due to patient being on BiPAP.  Physical Exam/Data:   Vitals:   10/31/19 0820 10/31/19 0900 10/31/19 1153 10/31/19 1230  BP: 111/77 124/66  (!) 157/95  Pulse: (!) 107 91  82  Resp: (!) 27 (!) 38  (!) 30  Temp:      TempSrc:      SpO2: 99% 98% 99% 97%    Intake/Output Summary (Last 24 hours) at 10/31/2019 1250 Last data filed at 10/31/2019 1123 Gross per 24 hour  Intake 100 ml  Output 750 ml  Net -650 ml   Last 3 Weights 10/24/2019 10/24/2019 10/23/2019  Weight (lbs) 175 lb 9.6 oz 183 lb 6.8 oz 183 lb 3.2 oz  Weight (kg) 79.652 kg 83.2 kg 83.1 kg     There is no height or weight on file to calculate BMI.  General: 59 y.o. male on BiPAP. HEENT: Normocephalic and atraumatic.  Neck: Supple. No JVD. Heart: RRR. Distinct S1 and S2. No murmurs, gallops, or rubs. Radial pulses 2+ and equal bilaterally. Lungs: Tachypneic with increased work of breathing. On BiPAP. Decreased breath sound on right. Rhonchi and mild crackles present.  Abdomen: Soft, non-distended,  and non-tender to palpation. Bowel sounds present. MSK: Normal strength and tone for age. Extremities: No lower extremity edema.    Skin: Warm and dry. Neuro: Alert and oriented x3. No focal deficits. Psych: Normal affect. Responds appropriately.   EKG:  The EKG was personally reviewed and demonstrates:   - Initial EKG on 10/30/2019 showed normal sinus rhythm, rate 92 bpm, with T depressions in inferior leads as well as ST depression and biphasic T waves in V3-V5.  - Repeat EKG on 10/31/2019 showed sinus tachycardia, rate 101 bpm, with LVH and repolarization changes. Prolonged QTc of 498 ms.  Telemetry:  Telemetry was personally reviewed and demonstrates: Sinus rhythm with rates ranging from the 80's as high as the 140's. Currently rates are well controlled.  Relevant CV Studies: Echo pending.  Laboratory Data:  High Sensitivity Troponin:   Recent Labs  Lab 10/20/19 1710 10/30/19 2124 10/31/19 0104  TROPONINIHS 8 15 736*     Chemistry Recent Labs  Lab 10/26/19 0211 10/26/19 0211 10/30/19 2124 10/30/19 2345 10/31/19 0104 10/31/19 0421  NA 141   < > 140 144  --  141  K 3.2*   < > 3.0* 3.1*  --  3.5  CL 100  --  103  --   --  104  CO2 30  --  24  --   --  25  GLUCOSE 125*  --  219*  --   --  190*  BUN 23  --  16  --   --  19  CREATININE 0.95   < > 1.02  --  0.94 0.92  CALCIUM 9.5  --  8.8*  --   --  8.6*  GFRNONAA >60   < > >60  --  >60 >60  GFRAA >60   < > >60  --  >60 >60  ANIONGAP 11  --  13  --   --  12   < > = values in this interval not displayed.    Recent  Labs  Lab 10/31/19 0421  PROT 7.2  ALBUMIN 2.9*  AST 52*  ALT 65*  ALKPHOS 75  BILITOT 0.9   Hematology Recent Labs  Lab 10/26/19 0211 10/26/19 0211 10/30/19 2124 10/30/19 2345 10/31/19 0104  WBC 7.9  --  13.1*  --  16.2*  RBC 5.02  --  4.78  --  4.53  HGB 15.7   < > 15.3 15.0 13.9  HCT 44.3   < > 45.7 44.0 41.4  MCV 88.2  --  95.6  --  91.4  MCH 31.3  --  32.0  --  30.7  MCHC 35.4  --   33.5  --  33.6  RDW 12.9  --  13.2  --  13.0  PLT 167  --  231  --  213   < > = values in this interval not displayed.   BNP Recent Labs  Lab 10/30/19 2126  BNP 91.7    DDimer  Recent Labs  Lab 10/31/19 0104  DDIMER 3.17*     Radiology/Studies:  CT ANGIO CHEST PE W OR WO CONTRAST  Result Date: 10/31/2019 CLINICAL DATA:  Shortness of breath.  Concern for PE. EXAM: CT ANGIOGRAPHY CHEST WITH CONTRAST TECHNIQUE: Multidetector CT imaging of the chest was performed using the standard protocol during bolus administration of intravenous contrast. Multiplanar CT image reconstructions and MIPs were obtained to evaluate the vascular anatomy. CONTRAST:  OMNIPAQUE IOHEXOL 350 MG/ML SOLN COMPARISON:  X-ray from same day. FINDINGS: Cardiovascular: Contrast injection is sufficient to demonstrate satisfactory opacification of the pulmonary arteries to the segmental level. There are acute segmental and subsegmental pulmonary emboli involving the bilateral lower lobes. The size of the main pulmonary artery is normal. Heart size is normal, with no pericardial effusion. There is no definite CT evidence for right-sided heart strain. There are minimal atherosclerotic changes of the thoracic aorta. There is no evidence for an aneurysm. Mediastinum/Nodes: -- No mediastinal lymphadenopathy. -- No hilar lymphadenopathy. -- No axillary lymphadenopathy. -- No supraclavicular lymphadenopathy. --there is asymmetric enlargement of the right thyroid gland which extends into the substernal region. Thyroid gland is heterogeneous. -  Unremarkable esophagus. Lungs/Pleura: There is extensive debris within the right lower lobe bronchus. There is near complete collapse of the right lower lobe. There are nonenhancing portions of the right lower lobe concerning for aspiration pneumonia. There are airspace opacities in the left lower lobe, which may be related to pneumonia, aspiration, or pulmonary infarcts. There is no  pneumothorax. Upper Abdomen: Contrast bolus timing is not optimized for evaluation of the abdominal organs. The visualized portions of the organs of the upper abdomen are normal. Musculoskeletal: No chest wall abnormality. No bony spinal canal stenosis. Review of the MIP images confirms the above findings. IMPRESSION: 1. Bilateral segmental and subsegmental pulmonary emboli involving the lower lobes. No CT evidence for heart strain. 2. Findings are concerning for aspiration with complete collapse of the right lower lobe and probable developing aspiration pneumonia within the collapsed lung. 3. Airspace opacities in the left lower lobe are concerning for aspiration, pneumonia, or developing pulmonary infarcts in the setting of acute pulmonary emboli. 4. Asymmetric, heterogeneous enlargement of the thyroid gland. Outpatient follow-up thyroid ultrasound is recommended for further evaluation of this finding.(Ref: J Am Coll Radiol. 2015 Feb;12(2): 143-50). These results were called by telephone at the time of interpretation on 10/31/2019 at 2:38 am to provider Cardama, who verbally acknowledged these results. Electronically Signed   By: Katherine Mantle M.D.   On:  10/31/2019 02:43   DG Chest Portable 1 View  Result Date: 10/30/2019 CLINICAL DATA:  Shortness of breath EXAM: PORTABLE CHEST 1 VIEW COMPARISON:  October 20, 2019 FINDINGS: There are hazy airspace opacities at the lung bases bilaterally which are new since prior study. There is no pneumothorax. There may be a small right-sided pleural effusion. The heart size is stable. There is no acute osseous abnormality. IMPRESSION: 1. New hazy bibasilar airspace opacities concerning for pneumonia or atelectasis. 2. Possible small right-sided pleural effusion. Electronically Signed   By: Katherine Mantle M.D.   On: 10/30/2019 21:35   { TIMI Risk Score for Unstable Angina or Non-ST Elevation MI:   The patient's TIMI risk score is 3, which indicates a 13% risk of all  cause mortality, new or recurrent myocardial infarction or need for urgent revascularization in the next 14 days.    Assessment and Plan:   Elevated Troponin - High-sensitivity troponin 15 >> 789. Repeats pending. - Initial EKG showed ST depression in inferior leads and ST depression/biphasic T wave inversion in V3-V5.  - Echo pending.  - Patient denies any chest pain. - This could just be all demand ischemia in setting of significant hypoxic respiratory failure, pneumonia, and PE. However, patient does have multiple CV risk factor including HTN and significant smoking history. Will trend troponin and check Echo to assess EF and wall motion. Further recommendations to follow. I would not pursue any ischemic evaluation at this time until his respiratory status improves.  Acute Hypoxic Respiratory Failure - Secondary to probable pneumonia and PE. Required BiPAP on presentation. - Chest CTA showed bilateral segmental and subsegmental pulmonary emboli involving the lower lobes as well as findings concerning for aspiration pneumonia (complete collapse of right lower lobe and probable developing aspiration pneumonia as well as airspace opacities in the left lower lobe). - Started on empiric antibiotics for healthcare acquired pneumonia.  - Started on IV Heparin. - Management per PCCM.  Hypertension - BP markedly elevated on arrival. As high as 215/110. - Started on IV Nitro.  - Continue home Amlodipine 10mg  daily and Coreg 3.125mg  twice daily. Can up-titrate Coreg if needed.  For questions or updates, please contact [facility] Please consult [email or web address] for contact info under    Signed, , PA-C  10/31/2019 12:50 PM

## 2019-10-31 NOTE — Progress Notes (Signed)
Patient transported from ED [facility] to CT and back with no complications.

## 2019-10-31 NOTE — ED Notes (Signed)
Pt work of breathing increased, pt very diaphoretic, CCM paged for reassessment of status change. En route

## 2019-10-31 NOTE — Consult Note (Signed)
NAME:  Marco Pittman, MRN:  [id number], DOB:  08-22-1960, LOS: 1 ADMISSION DATE:  10/30/2019, CONSULTATION DATE: 10/31/2019 REFERRING MD: Dr. Toniann Fail, CHIEF COMPLAINT: Multifactorial respiratory failure  Brief History     History of present illness   59 year old man with a history of tobacco and alcohol abuse, PBC, hypertension, angioedema with recent hospitalization for acute respiratory failure require mechanical ventilation.  Admitted with acute shortness of breath and chest discomfort 7/25.  Noted to have severe hypertension in the ED, systolic 200 and started on NTG gtt.  CT-PA subsequently revealed new bilateral subsegmental PE without any evidence of RV strain, debris in the right lower lobe with right lower lobe infiltrate concerning for possible aspiration pneumonia.  Intermittently tachypneic and started on BiPAP in the ED.  Past Medical History   Past Medical History:  Diagnosis Date  . Aneurysm (HCC)   . ETOH abuse   . Hypertension   . Pancreatitis 03/04/2016  . Umbilical hernia    symptomatic umbilical hernia/notes 03/04/2016  Angioedema due to ACE inhibitor 10/2019, acute respiratory failure   Significant Hospital Events   Admitted on BiPAP, NTG gtt 7/26  Consults:  PCCM Cardiology  Procedures:    Significant Diagnostic Tests:  CT-PA 7/26 >> bilateral segmental and subsegmental PE without any evidence for right heart strain, debris in the right lower lobe airway with right focal lower lobe airspace disease and collapse.  Asymmetric enlargement of thyroid  Micro Data:  SARSCoV2 7/25 >> negative Respiratory 7/25 >>   Antimicrobials:  Azithromycin 7/25 Ceftriaxone 7/25 Vancomycin 7/26 >>  Zosyn 7/26 >>  Interim history/subjective:  Awake and interacting.  Remains hypertensive but improved.  Tolerating BiPAP at tachypneic when he is trying to speak.  States that he feels he can probably come off BiPAP.  Denies any chest pain  Objective   Blood  pressure (!) 121/90, pulse 98, temperature 98.6 F (37 C), temperature source Oral, resp. rate (!) 31, SpO2 97 %.    Vent Mode: BIPAP;PCV FiO2 (%):  [60 %] 60 % Set Rate:  [8 bmp] 8 bmp PEEP:  [6 cmH20] 6 cmH20 Plateau Pressure:  [16 cmH20] 16 cmH20   Intake/Output Summary (Last 24 hours) at 10/31/2019 0641 Last data filed at 10/31/2019 0545 Gross per 24 hour  Intake --  Output 450 ml  Net -450 ml   There were no vitals filed for this visit.  Examination: General: Comfortable on BiPAP, no distress HENT: Oropharynx moist, no lesions Lungs: Coarse on the right, clear on the left, small volumes, tachypnea Cardiovascular: Regular, borderline tachycardic.  Hypertensive Abdomen: Nondistended, nontender, positive bowel sounds Extremities: No edema, no cords Neuro: Awake, interacting appropriately, answers questions, follows commands, good strength  Resolved Hospital Problem list     Assessment & Plan:  Acute respiratory failure with hypoxemia, multifactorial, which makes decision regarding lytics more complicated.  He has bilateral segmental PE but no evidence of RV strain on CT scan.  Echocardiogram is pending.  Other contributors include admission with hypertensive urgency/crisis, right lower lobe infiltrate with debris concerning for possible aspiration pneumonia. -Stable on BiPAP, in fact he probably can come off at this point.  No indication at this time that he is developing, would require intubation mechanical ventilation -Tight blood pressure control as below -Would treat him for HCAP given his recent hospitalization and intubation as below -Heparin infusion as ordered.  Could consider lytics given his overall degree of illness, but unclear to me based on the size of his PE  and absence of evidence of RV strain on CT that this is indicated.  An echocardiogram is ordered and pending.  If more evidence collected for RV strain then would reconsider. -Would change his admission to ICU  admission given his current BiPAP use, the likelihood that we will need to titrate antihypertensives, etc.  HCAP, aspiration pneumonia -Plan to change Zosyn and azithromycin to cefepime and vancomycin given his recent hospitalization, also concern for aspiration  -Obtain respiratory culture if possible -Pulmonary hygiene -Follow chest x-ray  Hypertensive urgency.  Off his ACE inhibitor since recent admission -Discharged on amlodipine, carvedilol, remains on these -Nitroglycerin drip initiated in the ED with appropriate drop in systolic, remains significantly hypertensive.  May require transition to another agent, nicardipine or Cleviprex for tighter control  Hyperglycemia, recent steroid use, no history DM -Sliding-scale insulin if needed  Asymmetric thyroid enlargement -Will require ultrasound evaluation either during this hospitalization or as an outpatient  History of alcohol abuse, remains active -Watch for any evidence of withdrawal  PBC -Continue ursodiol -Follow LFT   Best practice:  Diet: N.p.o. while on BiPAP Pain/Anxiety/Delirium protocol (if indicated): N/A VAP protocol (if indicated): N/A DVT prophylaxis: heparin infusion GI prophylaxis: Protonix p.o. Glucose control: Follow CBG, start SSI if indicated Mobility: Bedrest Code Status: Full Family Communication: None at bedside Disposition: Change to ICU admission 7/26  Labs   CBC: Recent Labs  Lab 10/26/19 0211 10/30/19 2124 10/30/19 2345 10/31/19 0104  WBC 7.9 13.1*  --  16.2*  NEUTROABS 4.5  --   --   --   HGB 15.7 15.3 15.0 13.9  HCT 44.3 45.7 44.0 41.4  MCV 88.2 95.6  --  91.4  PLT 167 231  --  213    Basic Metabolic Panel: Recent Labs  Lab 10/25/19 0245 10/26/19 0211 10/30/19 2124 10/30/19 2345 10/31/19 0104 10/31/19 0421  NA 137 141 140 144  --  141  K 3.6 3.2* 3.0* 3.1*  --  3.5  CL 102 100 103  --   --  104  CO2 27 30 24   --   --  25  GLUCOSE 156* 125* 219*  --   --  190*  BUN 16  23 16   --   --  19  CREATININE 0.90 0.95 1.02  --  0.94 0.92  CALCIUM 8.9 9.5 8.8*  --   --  8.6*  MG  --  2.4  --   --   --  2.4   GFR: Estimated Creatinine Clearance: 85.8 mL/min (by C-G formula based on SCr of 0.92 mg/dL). Recent Labs  Lab 10/26/19 0211 10/30/19 2124 10/31/19 0104  PROCALCITON  --   --  0.77  WBC 7.9 13.1* 16.2*    Liver Function Tests: Recent Labs  Lab 10/31/19 0421  AST 52*  ALT 65*  ALKPHOS 75  BILITOT 0.9  PROT 7.2  ALBUMIN 2.9*   No results for input(s): LIPASE, AMYLASE in the last 168 hours. No results for input(s): AMMONIA in the last 168 hours.  ABG    Component Value Date/Time   PHART 7.419 10/30/2019 2345   PCO2ART 42.2 10/30/2019 2345   PO2ART 112 (H) 10/30/2019 2345   HCO3 27.3 10/30/2019 2345   TCO2 29 10/30/2019 2345   O2SAT 98.0 10/30/2019 2345     Coagulation Profile: No results for input(s): INR, PROTIME in the last 168 hours.  Cardiac Enzymes: No results for input(s): CKTOTAL, CKMB, CKMBINDEX, TROPONINI in the last 168 hours.  HbA1C: Hemoglobin  A1C  Date/Time Value Ref Range Status  05/05/2018 09:12 AM 5.9 (A) 4.0 - 5.6 % Final  10/10/2016 09:20 AM 6.1  Final   Hgb A1c MFr Bld  Date/Time Value Ref Range Status  10/18/2019 03:09 PM 5.9 (H) 4.8 - 5.6 % Final    Comment:             Prediabetes: 5.7 - 6.4          Diabetes: >6.4          Glycemic control for adults with diabetes: <7.0   03/05/2016 05:43 AM 5.9 (H) 4.8 - 5.6 % Final    Comment:    (NOTE)         Pre-diabetes: 5.7 - 6.4         Diabetes: >6.4         Glycemic control for adults with diabetes: <7.0     CBG: Recent Labs  Lab 10/24/19 0736 10/24/19 1127 10/30/19 2132  GLUCAP 152* 145* 155*    Review of Systems:   As per HPi  Past Medical History  He,  has a past medical history of Aneurysm (HCC), ETOH abuse, Hypertension, Pancreatitis (03/04/2016), and Umbilical hernia.   Surgical History    Past Surgical History:  Procedure  Laterality Date  . INSERTION OF MESH N/A 04/14/2016   Procedure: INSERTION OF MESH;  Surgeon: Avel Peace, MD;  Location: [facility];  Service: General;  Laterality: N/A;  . UMBILICAL HERNIA REPAIR N/A 04/14/2016   Procedure: UMBILICAL HERNIA REPAIR WITH MESH;  Surgeon: Avel Peace, MD;  Location: [facility];  Service: General;  Laterality: N/A;     Social History   reports that he has been smoking cigars. He has a 22.50 pack-year smoking history. He has never used smokeless tobacco. He reports current alcohol use of about 14.0 standard drinks of alcohol per week. He reports that he does not use drugs.   Family History   His family history includes Cancer (age of onset: 26) in his father.   Allergies Allergies  Allergen Reactions  . Lisinopril Swelling    Angioedema     Home Medications  Prior to Admission medications   Medication Sig Start Date End Date Taking? Authorizing Provider  amLODipine (NORVASC) 10 MG tablet Take 1 tablet (10 mg total) by mouth daily. 10/18/19   Marcine Matar, MD  carvedilol (COREG) 3.125 MG tablet Take 1 tablet (3.125 mg total) by mouth 2 (two) times daily with a meal. 10/20/19   Marcine Matar, MD  predniSONE (DELTASONE) 10 MG tablet Take 5 tablets (50 mg total) by mouth daily with breakfast for 2 days, THEN 4 tablets (40 mg total) daily with breakfast for 2 days, THEN 3 tablets (30 mg total) daily with breakfast for 2 days, THEN 2 tablets (20 mg total) daily with breakfast for 2 days, THEN 1 tablet (10 mg total) daily with breakfast for 2 days. 10/26/19 11/05/19  Hollice Espy, MD  ursodiol (ACTIGALL) 500 MG tablet TAKE 1 TABLET BY MOUTH 3 (THREE) TIMES DAILY. Patient taking differently: Take 500 mg by mouth 3 (three) times daily.  10/18/19   Marcine Matar, MD     Critical care time: 40 min      Levy Pupa, MD, PhD 10/31/2019, 7:02 AM [facility] [phone] or if no answer  [phone]

## 2019-10-31 NOTE — ED Notes (Signed)
Attempted to obtain labs. Unsuccessful. Requested phlebotomy to attempt

## 2019-10-31 NOTE — Procedures (Signed)
Intubation Procedure Note  Marco Pittman  [id number]  1960/09/09  Date:10/31/19  Time:4:33 PM   Provider Performing:Joene Gelder L Cailie Bosshart   Procedure: Intubation (31500)  Indication(s) Respiratory Failure  Consent Unable to obtain consent due to emergent nature of procedure.  Anesthesia Etomidate, Versed, Fentanyl and Rocuronium  Time Out Verified patient identification, verified procedure, site/side was marked, verified correct patient position, special equipment/implants available, medications/allergies/relevant history reviewed, required imaging and test results available.  Sterile Technique Usual hand hygeine, masks, and gloves were used  Procedure Description Patient positioned in bed supine.  Sedation given as noted above.  Patient was intubated with endotracheal tube using Glidescope.  View was Grade 1 full glottis .  Number of attempts was 1.  Colorimetric CO2 detector was consistent with tracheal placement.  Complications/Tolerance None; patient tolerated the procedure well. Chest X-ray is ordered to verify placement.  EBL 0 ml  Specimen(s) None   Josephine Igo, DO  Pulmonary Critical Care 10/31/2019 4:34 PM

## 2019-10-31 NOTE — ED Notes (Signed)
Pt calm when RN arrived, pt beginning to be restless, coughing, blood pressure increasing, increase propofol to 15 mcg/kg/min

## 2019-10-31 NOTE — H&P (Addendum)
History and Physical    Marco SavoyMichael W Tool ZOX:[id number] DOB: 04/12/1960 DOA: 10/30/2019  PCP: Marcine MatarJohnson, Deborah B, MD  Patient coming from: Home.  Chief Complaint: Shortness of breath.  HPI: Marco Pittman is a 59 y.o. male with history of hypertension, chronic pancreatitis, tobacco/alcohol abuse, primary biliary cirrhosis was recently admitted to the hospital discharged 5 days ago after being admitted for angioedema secondary to ACE inhibitor at the time patient was mechanically ventilated presents to the ER after patient started telling sudden onset of shortness of breath while at home doing his errands.  Patient also was diaphoretic with no chest pain crepitation or dizziness.  Patient acutely became short of breath was on nonrebreather and was brought to the ER.  ED Course: In the ER patient blood pressure was found to be more than 200 systolic was started on nitroglycerin infusion.  EKG was showing sinus rhythm with nonspecific ST changes high sensitive troponin was initially 15.  WBC 13.1 potassium 3 blood glucose 219 urine drug screen positive for marijuana BNP 91.7.  Chest x-ray showing possibility of pneumonia.  Patient was given nebulizer treatment steroids and antibiotics for possible pneumonia and COPD.  Admit for further management.  Review of Systems: As per HPI, rest all negative.   Past Medical History:  Diagnosis Date  . Aneurysm (HCC)   . ETOH abuse   . Hypertension   . Pancreatitis 03/04/2016  . Umbilical hernia    symptomatic umbilical hernia/notes 03/04/2016    Past Surgical History:  Procedure Laterality Date  . INSERTION OF MESH N/A 04/14/2016   Procedure: INSERTION OF MESH;  Surgeon: Avel Peaceodd Rosenbower, MD;  Location: [facility];  Service: General;  Laterality: N/A;  . UMBILICAL HERNIA REPAIR N/A 04/14/2016   Procedure: UMBILICAL HERNIA REPAIR WITH MESH;  Surgeon: Avel Peaceodd Rosenbower, MD;  Location: [facility];  Service: General;   Laterality: N/A;     reports that he has been smoking cigars. He has a 22.50 pack-year smoking history. He has never used smokeless tobacco. He reports current alcohol use of about 14.0 standard drinks of alcohol per week. He reports that he does not use drugs.  Allergies  Allergen Reactions  . Lisinopril Swelling    Angioedema    Family History  Problem Relation Age of Onset  . Cancer Father [id number]       colon    Prior to Admission medications   Medication Sig Start Date End Date Taking? Authorizing Provider  amLODipine (NORVASC) 10 MG tablet Take 1 tablet (10 mg total) by mouth daily. 10/18/19   Marcine MatarJohnson, Deborah B, MD  carvedilol (COREG) 3.125 MG tablet Take 1 tablet (3.125 mg total) by mouth 2 (two) times daily with a meal. 10/20/19   Marcine MatarJohnson, Deborah B, MD  predniSONE (DELTASONE) 10 MG tablet Take 5 tablets (50 mg total) by mouth daily with breakfast for 2 days, THEN 4 tablets (40 mg total) daily with breakfast for 2 days, THEN 3 tablets (30 mg total) daily with breakfast for 2 days, THEN 2 tablets (20 mg total) daily with breakfast for 2 days, THEN 1 tablet (10 mg total) daily with breakfast for 2 days. 10/26/19 11/05/19  Hollice EspyKrishnan, Sendil K, MD  ursodiol (ACTIGALL) 500 MG tablet TAKE 1 TABLET BY MOUTH 3 (THREE) TIMES DAILY. Patient taking differently: Take 500 mg by mouth 3 (three) times daily.  10/18/19   Marcine MatarJohnson, Deborah B, MD    Physical Exam: Constitutional: Moderately built and nourished. Vitals:  10/30/19 2338 10/30/19 2342 10/30/19 2345 10/30/19 2350  BP:  100/79 116/74 111/82  Pulse: (!) 124 (!) 118 (!) 118 (!) 117  Resp: (!) 32 (!) 39 (!) 37 (!) 34  Temp:      TempSrc:      SpO2: 98% 98% 99% 99%   Eyes: Anicteric no pallor. ENMT: No discharge from the ears eyes nose or mouth. Neck: No JVD elevated no mass felt. Respiratory: No rhonchi or crepitations. Cardiovascular: S1-S2 heard. Abdomen: Soft nontender bowel sounds present. Musculoskeletal: No edema. Skin: No  rash. Neurologic: Alert awake oriented to time place and person.  Moves all extremities. Psychiatric: Appears normal per normal affect.   Labs on Admission: I have personally reviewed following labs and imaging studies  CBC: Recent Labs  Lab 10/26/19 0211 10/30/19 2124 10/30/19 2345  WBC 7.9 13.1*  --   NEUTROABS 4.5  --   --   HGB 15.7 15.3 15.0  HCT 44.3 45.7 44.0  MCV 88.2 95.6  --   PLT 167 231  --    Basic Metabolic Panel: Recent Labs  Lab 10/24/19 0404 10/25/19 0245 10/26/19 0211 10/30/19 2124 10/30/19 2345  NA 140 137 141 140 144  K 3.9 3.6 3.2* 3.0* 3.1*  CL 107 102 100 103  --   CO2 21* 27 30 24   --   GLUCOSE 125* 156* 125* 219*  --   BUN 16 16 23 16   --   CREATININE 1.06 0.90 0.95 1.02  --   CALCIUM 8.9 8.9 9.5 8.8*  --   MG  --   --  2.4  --   --    GFR: Estimated Creatinine Clearance: 77.3 mL/min (by C-G formula based on SCr of 1.02 mg/dL). Liver Function Tests: No results for input(s): AST, ALT, ALKPHOS, BILITOT, PROT, ALBUMIN in the last 168 hours. No results for input(s): LIPASE, AMYLASE in the last 168 hours. No results for input(s): AMMONIA in the last 168 hours. Coagulation Profile: No results for input(s): INR, PROTIME in the last 168 hours. Cardiac Enzymes: No results for input(s): CKTOTAL, CKMB, CKMBINDEX, TROPONINI in the last 168 hours. BNP (last 3 results) No results for input(s): PROBNP in the last 8760 hours. HbA1C: No results for input(s): HGBA1C in the last 72 hours. CBG: Recent Labs  Lab 10/24/19 0736 10/24/19 1127 10/30/19 2132  GLUCAP 152* 145* 155*   Lipid Profile: No results for input(s): CHOL, HDL, LDLCALC, TRIG, CHOLHDL, LDLDIRECT in the last 72 hours. Thyroid Function Tests: No results for input(s): TSH, T4TOTAL, FREET4, T3FREE, THYROIDAB in the last 72 hours. Anemia Panel: No results for input(s): VITAMINB12, FOLATE, FERRITIN, TIBC, IRON, RETICCTPCT in the last 72 hours. Urine analysis:    Component Value  Date/Time   COLORURINE AMBER (A) 03/04/2016 1221   APPEARANCEUR HAZY (A) 03/04/2016 1221   APPEARANCEUR Cloudy (A) 10/06/2012 1452   LABSPEC 1.024 03/04/2016 1221   PHURINE 5.5 03/04/2016 1221   GLUCOSEU 100 (A) 03/04/2016 1221   HGBUR NEGATIVE 03/04/2016 1221   BILIRUBINUR SMALL (A) 03/04/2016 1221   BILIRUBINUR Negative 10/06/2012 1452   KETONESUR 15 (A) 03/04/2016 1221   PROTEINUR 30 (A) 03/04/2016 1221   NITRITE POSITIVE (A) 03/04/2016 1221   LEUKOCYTESUR SMALL (A) 03/04/2016 1221   LEUKOCYTESUR 1+ (A) 10/06/2012 1452   Sepsis Labs: @LABRCNTIP (procalcitonin:4,lacticidven:4) ) Recent Results (from the past 240 hour(s))  SARS Coronavirus 2 by RT PCR (hospital order, performed in [facility] hospital lab) Nasopharyngeal Nasopharyngeal Swab     Status:  None   Collection Time: 10/30/19  9:28 PM   Specimen: Nasopharyngeal Swab  Result Value Ref Range Status   SARS Coronavirus 2 NEGATIVE NEGATIVE Final    Comment: (NOTE) SARS-CoV-2 target nucleic acids are NOT DETECTED.  The SARS-CoV-2 RNA is generally detectable in upper and lower respiratory specimens during the acute phase of infection. The lowest concentration of SARS-CoV-2 viral copies this assay can detect is 250 copies / mL. A negative result does not preclude SARS-CoV-2 infection and should not be used as the sole basis for treatment or other patient management decisions.  A negative result may occur with improper specimen collection / handling, submission of specimen other than nasopharyngeal swab, presence of viral mutation(s) within the areas targeted by this assay, and inadequate number of viral copies (<250 copies / mL). A negative result must be combined with clinical observations, patient history, and epidemiological information.  Fact Sheet for Patients:   BoilerBrush.com.cy  Fact Sheet for Healthcare Providers: https://pope.com/  This test is not yet approved  or  cleared by the [place] FDA and has been authorized for detection and/or diagnosis of SARS-CoV-2 by FDA under an Emergency Use Authorization (EUA).  This EUA will remain in effect (meaning this test can be used) for the duration of the COVID-19 declaration under Section 564(b)(1) of the Act, 21 U.S.C. section 360bbb-3(b)(1), unless the authorization is terminated or revoked sooner.  Performed at [facility], [address] [place], [place] [address]      Radiological Exams on Admission: DG Chest Portable 1 View  Result Date: 10/30/2019 CLINICAL DATA:  Shortness of breath EXAM: PORTABLE CHEST 1 VIEW COMPARISON:  October 20, 2019 FINDINGS: There are hazy airspace opacities at the lung bases bilaterally which are new since prior study. There is no pneumothorax. There may be a small right-sided pleural effusion. The heart size is stable. There is no acute osseous abnormality. IMPRESSION: 1. New hazy bibasilar airspace opacities concerning for pneumonia or atelectasis. 2. Possible small right-sided pleural effusion. Electronically Signed   By: Katherine Mantle M.D.   On: 10/30/2019 21:35    EKG: Independently reviewed.  Normal sinus rhythm with nonspecific changes.  Assessment/Plan Active Problems:   History of alcohol abuse   Primary biliary cirrhosis (HCC)   Acute hypoxemic respiratory failure (HCC)   Hypertensive urgency    1. Acute respiratory failure with hypoxia cause at this point is not clear chest x-ray does show some features concerning for pneumonia but patient does not have any productive cough though has some leukocytosis which could also be from recent use of prednisone.  Patient also has elevated blood pressure which could be contributing to patient's shortness of breath.  Empiric antibiotics have been started patient is on BiPAP will continue with nebulizer treatment and antibiotics for now given the acute onset of shortness of breath I have ordered CT  angiogram of the chest and will cycle cardiac markers continue nitroglycerin infusion. 2. Hypertensive urgency likely contributing to patient's symptoms for which patient is on nitroglycerin infusion continue home medication amlodipine not sure if patient has started taking his Coreg which has been ordered. 3. Mild hypokalemia replace and recheck check magnesium with next blood draw. 4. Hyperglycemia could be from recent prednisone use.  Hemoglobin A1c last week was 5.9. 5. Recently admitted for angioedema from lisinopril was intubated at that time. 6. Alcohol and tobacco abuse advised about quitting patient states he still smokes and drinks.  We will keep patient on CIWA. 7.  Primary biliary cirrhosis on ursodiol.  Since patient has acute respiratory failure with hypoxia still short of breath on BiPAP will need close monitoring for any further deterioration in inpatient status.  Addendum -CT angiogram of the chest showed bilateral segmental and subsegmental pulmonary embolism with no heart strain.  Patient's second troponin came back as 736.  EKG showing sinus tachycardia.  Patient is chest pain-free.  Have started patient on heparin.  Will discuss with cardiology and pulmonary.  We will keep patient on aspirin in addition to the heparin.  Check 2D echo for any strain pattern.  In addition CT angiogram was also showing aspiration pneumonia and abnormal thyroid gland.   DVT prophylaxis: Heparin infusion. Code Status: Full code. Family Communication: Discussed with patient. Disposition Plan: Home. Consults called: Cardiology.  Pulmonary. Admission status: Inpatient.   Eduard Clos MD [facility] Pager [phone].  If 7PM-7AM, please contact night-coverage [email or web address] Password [facility]  10/31/2019, 12:23 AM

## 2019-10-31 NOTE — Procedures (Signed)
Bronchoscopy Procedure Note  Marco Pittman  [id number]  Sep 12, 1960  Date:10/31/19  Time:4:45 PM   Provider Performing:Marco Pittman   Procedure(s):  Flexible Bronchoscopy (73736) and Initial Therapeutic Aspiration of Tracheobronchial Tree [phone])  Indication(s) RLL mucus plugging   Consent Unable to obtain consent due to emergent nature of procedure.  Anesthesia S/p intubation with versed, fent, etomidate, rocuronium   Time Out Verified patient identification, verified procedure, site/side was marked, verified correct patient position, special equipment/implants available, medications/allergies/relevant history reviewed, required imaging and test results available.  Sterile Technique Usual hand hygiene, masks, gowns, and gloves were used  Procedure Description Bronchoscope advanced through endotracheal tube and into airway.  Airways were examined down to subsegmental level with findings noted below.   Following diagnostic evaluation, Therapeutic aspiration performed in RLL and RML which were fully occluded.There was thick secretions fully occluding the RLL and RML opening. They were tan and bloody. Freely suctioned clear with the bronchoscope.   Findings:  RLL and RML mucus plugging   Complications/Tolerance None; patient tolerated the procedure well. Chest X-ray is needed post procedure.  EBL <1cc  Specimen(s) None   Marco Nash, DO Marco Pittman 10/31/2019 4:50 PM

## 2019-10-31 NOTE — ED Notes (Signed)
Admitting at bedside 

## 2019-10-31 NOTE — ED Notes (Signed)
Communicated abnormal results CTA r/t PE and developing aspiration pneumonia. Report taken by EDP Cardama. Admiting Dr. Toniann Fail informed. EKG obtained, pending orders to start heparin. Admiting to come to bedside.

## 2019-11-01 ENCOUNTER — Inpatient Hospital Stay (HOSPITAL_COMMUNITY): Payer: Medicaid Other

## 2019-11-01 DIAGNOSIS — I10 Essential (primary) hypertension: Secondary | ICD-10-CM

## 2019-11-01 DIAGNOSIS — R778 Other specified abnormalities of plasma proteins: Secondary | ICD-10-CM

## 2019-11-01 DIAGNOSIS — I2699 Other pulmonary embolism without acute cor pulmonale: Secondary | ICD-10-CM

## 2019-11-01 LAB — GLUCOSE, CAPILLARY
Glucose-Capillary: 118 mg/dL — ABNORMAL HIGH (ref 70–99)
Glucose-Capillary: 122 mg/dL — ABNORMAL HIGH (ref 70–99)
Glucose-Capillary: 88 mg/dL (ref 70–99)
Glucose-Capillary: 112 mg/dL — ABNORMAL HIGH (ref 70–99)
Glucose-Capillary: 137 mg/dL — ABNORMAL HIGH (ref 70–99)

## 2019-11-01 LAB — POCT I-STAT 7, (LYTES, BLD GAS, ICA,H+H)
Acid-Base Excess: 3 mmol/L — ABNORMAL HIGH (ref 0.0–2.0)
Bicarbonate: 29.8 mmol/L — ABNORMAL HIGH (ref 20.0–28.0)
Calcium, Ion: 1.2 mmol/L (ref 1.15–1.40)
HCT: 43 % (ref 39.0–52.0)
Hemoglobin: 14.6 g/dL (ref 13.0–17.0)
O2 Saturation: 100 %
Patient temperature: 97.3
Potassium: 3 mmol/L — ABNORMAL LOW (ref 3.5–5.1)
Sodium: 148 mmol/L — ABNORMAL HIGH (ref 135–145)
TCO2: 31 mmol/L (ref 22–32)
pCO2 arterial: 50.2 mmHg — ABNORMAL HIGH (ref 32.0–48.0)
pH, Arterial: 7.379 (ref 7.350–7.450)
pO2, Arterial: 309 mmHg — ABNORMAL HIGH (ref 83.0–108.0)

## 2019-11-01 LAB — PHOSPHORUS
Phosphorus: 1.6 mg/dL — ABNORMAL LOW (ref 2.5–4.6)
Phosphorus: 2.4 mg/dL — ABNORMAL LOW (ref 2.5–4.6)

## 2019-11-01 LAB — CBC
HCT: 35 % — ABNORMAL LOW (ref 39.0–52.0)
Hemoglobin: 11.9 g/dL — ABNORMAL LOW (ref 13.0–17.0)
MCH: 31.1 pg (ref 26.0–34.0)
MCHC: 34 g/dL (ref 30.0–36.0)
MCV: 91.4 fL (ref 80.0–100.0)
Platelets: 218 10*3/uL (ref 150–400)
RBC: 3.83 MIL/uL — ABNORMAL LOW (ref 4.22–5.81)
RDW: 12.9 % (ref 11.5–15.5)
WBC: 9.6 10*3/uL (ref 4.0–10.5)
nRBC: 0 % (ref 0.0–0.2)

## 2019-11-01 LAB — MAGNESIUM
Magnesium: 2.4 mg/dL (ref 1.7–2.4)
Magnesium: 2.3 mg/dL (ref 1.7–2.4)

## 2019-11-01 LAB — HEPARIN LEVEL (UNFRACTIONATED)
Heparin Unfractionated: 0.27 IU/mL — ABNORMAL LOW (ref 0.30–0.70)
Heparin Unfractionated: 0.39 IU/mL (ref 0.30–0.70)

## 2019-11-01 LAB — TRIGLYCERIDES: Triglycerides: 110 mg/dL (ref <150)

## 2019-11-01 LAB — LEGIONELLA PNEUMOPHILA SEROGP 1 UR AG: L. pneumophila Serogp 1 Ur Ag: NEGATIVE

## 2019-11-01 MED ORDER — LACTATED RINGERS IV BOLUS
1000.0000 mL | Freq: Once | INTRAVENOUS | Status: AC
  Administered 2019-11-01: 1000 mL via INTRAVENOUS

## 2019-11-01 MED ORDER — FOLIC ACID 1 MG PO TABS: 1.0000 mg | ORAL_TABLET | Freq: Every day | ORAL | Status: DC

## 2019-11-01 MED ORDER — DOCUSATE SODIUM 50 MG/5ML PO LIQD
100.0000 mg | Freq: Two times a day (BID) | ORAL | Status: DC
  Administered 2019-11-01 – 2019-11-03 (×4): 100 mg
  Filled 2019-11-01 (×4): qty 10

## 2019-11-01 MED ORDER — VITAL HIGH PROTEIN PO LIQD: 1000.0000 mL | ORAL | Status: DC

## 2019-11-01 MED ORDER — THIAMINE HCL 100 MG PO TABS: 100.0000 mg | ORAL_TABLET | Freq: Every day | ORAL | Status: DC

## 2019-11-01 MED ORDER — ADULT MULTIVITAMIN W/MINERALS CH
1.0000 | ORAL_TABLET | Freq: Every day | ORAL | Status: DC
  Filled 2019-11-01: qty 1

## 2019-11-01 MED ORDER — AMLODIPINE BESYLATE 10 MG PO TABS
10.0000 mg | ORAL_TABLET | Freq: Every day | ORAL | Status: DC
  Administered 2019-11-02 – 2019-11-03 (×2): 10 mg
  Filled 2019-11-01 (×2): qty 1

## 2019-11-01 MED ORDER — CHLORHEXIDINE GLUCONATE CLOTH 2 % EX PADS
6.0000 | MEDICATED_PAD | Freq: Every day | CUTANEOUS | Status: DC
  Administered 2019-11-01 – 2019-11-05 (×5): 6 via TOPICAL

## 2019-11-01 MED ORDER — POLYETHYLENE GLYCOL 3350 17 G PO PACK
17.0000 g | PACK | Freq: Every day | ORAL | Status: DC
  Administered 2019-11-02 – 2019-11-04 (×2): 17 g
  Filled 2019-11-01 (×4): qty 1

## 2019-11-01 MED ORDER — VITAL AF 1.2 CAL PO LIQD
1000.0000 mL | ORAL | Status: DC
  Administered 2019-11-01 – 2019-11-02 (×2): 1000 mL

## 2019-11-01 MED ORDER — THIAMINE HCL 100 MG PO TABS
100.0000 mg | ORAL_TABLET | Freq: Every day | ORAL | Status: DC
  Administered 2019-11-02 – 2019-11-03 (×2): 100 mg
  Filled 2019-11-01 (×3): qty 1

## 2019-11-01 MED ORDER — PHENOL 1.4 % MT LIQD
1.0000 | OROMUCOSAL | Status: DC | PRN
  Filled 2019-11-01: qty 177

## 2019-11-01 MED ORDER — DEXMEDETOMIDINE HCL IN NACL 400 MCG/100ML IV SOLN
0.0000 ug/kg/h | INTRAVENOUS | Status: DC
  Administered 2019-11-01: 0.4 ug/kg/h via INTRAVENOUS
  Administered 2019-11-02: 0.2 ug/kg/h via INTRAVENOUS
  Administered 2019-11-02: 0.4 ug/kg/h via INTRAVENOUS
  Filled 2019-11-01: qty 200
  Filled 2019-11-01 (×2): qty 100

## 2019-11-01 MED ORDER — AMLODIPINE BESYLATE 10 MG PO TABS: 10.0000 mg | ORAL_TABLET | Freq: Every day | ORAL | Status: DC

## 2019-11-01 MED ORDER — MIDAZOLAM HCL 2 MG/2ML IJ SOLN
2.0000 mg | INTRAMUSCULAR | Status: DC | PRN
  Administered 2019-11-01: 2 mg via INTRAVENOUS
  Filled 2019-11-01: qty 2

## 2019-11-01 MED ORDER — CARVEDILOL 3.125 MG PO TABS
3.1250 mg | ORAL_TABLET | Freq: Two times a day (BID) | ORAL | Status: DC
  Filled 2019-11-01: qty 1

## 2019-11-01 MED ORDER — ADULT MULTIVITAMIN W/MINERALS CH
1.0000 | ORAL_TABLET | Freq: Every day | ORAL | Status: DC
  Administered 2019-11-02 – 2019-11-03 (×2): 1
  Filled 2019-11-01 (×2): qty 1

## 2019-11-01 MED ORDER — FOLIC ACID 1 MG PO TABS
1.0000 mg | ORAL_TABLET | Freq: Every day | ORAL | Status: DC
  Administered 2019-11-02 – 2019-11-03 (×2): 1 mg
  Filled 2019-11-01 (×3): qty 1

## 2019-11-01 MED ORDER — CARVEDILOL 3.125 MG PO TABS: 3.1250 mg | ORAL_TABLET | Freq: Two times a day (BID) | ORAL | Status: DC

## 2019-11-01 MED ORDER — ETOMIDATE 2 MG/ML IV SOLN
20.0000 mg | Freq: Once | INTRAVENOUS | Status: AC
  Administered 2019-11-01: 20 mg via INTRAVENOUS

## 2019-11-01 MED ORDER — PANTOPRAZOLE SODIUM 40 MG PO PACK
40.0000 mg | PACK | Freq: Every day | ORAL | Status: DC
  Administered 2019-11-02: 40 mg
  Filled 2019-11-01 (×2): qty 20

## 2019-11-01 MED ORDER — PROSOURCE TF PO LIQD
45.0000 mL | Freq: Two times a day (BID) | ORAL | Status: DC
  Administered 2019-11-01: 45 mL
  Filled 2019-11-01: qty 45

## 2019-11-01 MED ORDER — ADULT MULTIVITAMIN W/MINERALS CH: 1.0000 | ORAL_TABLET | Freq: Every day | ORAL | Status: DC

## 2019-11-01 MED ORDER — CARVEDILOL 3.125 MG PO TABS
3.1250 mg | ORAL_TABLET | Freq: Two times a day (BID) | ORAL | Status: DC
  Filled 2019-11-01 (×4): qty 1

## 2019-11-01 MED ORDER — FENTANYL CITRATE (PF) 100 MCG/2ML IJ SOLN
50.0000 ug | INTRAMUSCULAR | Status: DC | PRN
  Administered 2019-11-01: 100 ug via INTRAVENOUS
  Administered 2019-11-03: 50 ug via INTRAVENOUS
  Filled 2019-11-01 (×2): qty 2

## 2019-11-01 MED ORDER — IPRATROPIUM-ALBUTEROL 0.5-2.5 (3) MG/3ML IN SOLN
3.0000 mL | Freq: Four times a day (QID) | RESPIRATORY_TRACT | Status: DC
  Administered 2019-11-01 – 2019-11-04 (×12): 3 mL via RESPIRATORY_TRACT
  Filled 2019-11-01 (×13): qty 3

## 2019-11-01 MED ORDER — INSULIN ASPART 100 UNIT/ML ~~LOC~~ SOLN
0.0000 [IU] | SUBCUTANEOUS | Status: DC
  Administered 2019-11-01: 3 [IU] via SUBCUTANEOUS
  Administered 2019-11-02: 7 [IU] via SUBCUTANEOUS
  Administered 2019-11-02: 4 [IU] via SUBCUTANEOUS
  Administered 2019-11-02: 3 [IU] via SUBCUTANEOUS

## 2019-11-01 MED ORDER — ROCURONIUM BROMIDE 50 MG/5ML IV SOLN
70.0000 mg | Freq: Once | INTRAVENOUS | Status: AC
  Administered 2019-11-01: 70 mg via INTRAVENOUS
  Filled 2019-11-01: qty 7

## 2019-11-01 MED ORDER — DEXAMETHASONE SODIUM PHOSPHATE 4 MG/ML IJ SOLN
4.0000 mg | Freq: Four times a day (QID) | INTRAMUSCULAR | Status: DC
  Administered 2019-11-01 – 2019-11-04 (×13): 4 mg via INTRAVENOUS
  Filled 2019-11-01 (×13): qty 1

## 2019-11-01 NOTE — Progress Notes (Signed)
CSW received consult for patient for substance abuse counseling, patient's UDS positive for marijuana. Patient is currently on the ventilator but extubation is planned for today. CSW will visit with patient once he is extubated and able to communicate.  Edwin Dada, MSW, LCSW-A Transitions of Care  Clinical Social Worker  [facility]  Medical ICU [phone]

## 2019-11-01 NOTE — Progress Notes (Addendum)
Initial Nutrition Assessment  DOCUMENTATION CODES:   Non-severe (moderate) malnutrition in context of social or environmental circumstances  INTERVENTION:   Initiate tube feeding via OG tube: Vital AF 1.2 at 25 ml/h, increase by 10 ml every 8 hours to goal rate of 65 ml/h (1560 ml per day)  Provides 1872 kcal, 117 gm protein, 1265 ml free water daily.  Monitor magnesium, potassium, and phosphorus, MD to replete as needed, as pt is at risk for refeeding syndrome given history of ETOH abuse and moderate PCM.  NUTRITION DIAGNOSIS:   Moderate Malnutrition related to social / environmental circumstances (ETOH abuse) as evidenced by mild fat depletion, moderate muscle depletion, severe muscle depletion.  GOAL:   Patient will meet greater than or equal to 90% of their needs  MONITOR:   Vent status, TF tolerance, Labs  REASON FOR ASSESSMENT:   Ventilator, Consult Enteral/tube feeding initiation and management  ASSESSMENT:   59 yo male admitted with severe HTN, bilateral PE, RLL ASD with mucous plugging. PMH includes tobacco use, ETOH abuse, HTN, pancreatitis, angioedema r/t ACE inhibitor, aneurysm.   Discussed patient in ICU rounds and with RN today. Patient was extubated this morning, then required re-intubation. OG tube in place. Received MD Consult for TF initiation and management.  Patient is currently intubated on ventilator support MV: 13.3 L/min Temp (24hrs), Avg:97.1 F (36.2 C), Min:96.1 F (35.6 C), Max:98.1 F (36.7 C)  Propofol: none  Labs reviewed. Phos 1.6, sodium 148, potassium 3  CBG: 88  Medications reviewed and include decadron, colace, folic acid, novolog, MVI with minerals, thiamine.  Weight encounters reviewed. Unsure of usual weight. Last admission, weight was 80.1 kg (10/18/19), which was increased due to swelling with angioedema. Currently 69.1 kg. 14% weight loss in less than one month, however, most likely related to fluid status. Suspect patient  may have actually lost weight, given mild-moderate-severe depletion of muscle and subcutaneous fat mass, but unable to determine at this time.   NUTRITION - FOCUSED PHYSICAL EXAM:    Most Recent Value  Orbital Region Mild depletion  Upper Arm Region Mild depletion  Thoracic and Lumbar Region Unable to assess  Buccal Region Mild depletion  Temple Region Moderate depletion  Clavicle Bone Region Moderate depletion  Clavicle and Acromion Bone Region Severe depletion  Scapular Bone Region Unable to assess  Dorsal Hand Unable to assess  Patellar Region Severe depletion  Anterior Thigh Region Severe depletion  Posterior Calf Region Severe depletion  Edema (RD Assessment) None  Hair Reviewed  Eyes Unable to assess  Mouth Unable to assess  Skin Reviewed  Nails Reviewed       Diet Order:   Diet Order            Diet NPO time specified  Diet effective now                 EDUCATION NEEDS:   Not appropriate for education at this time  Skin:  Skin Assessment: Reviewed RN Assessment  Last BM:  7/26  Height:   Ht Readings from Last 1 Encounters:  10/24/19 5' 9.5" (1.765 m)    Weight:   Wt Readings from Last 1 Encounters:  10/31/19 69.1 kg    Ideal Body Weight:  74.1 kg  BMI:  Body mass index is 22.17 kg/m.  Estimated Nutritional Needs:   Kcal:  1820  Protein:  100-120 gm  Fluid:  >/= 1.8 L    Gabriel Rainwater, RD, LDN, CNSC Please refer to Amion for  contact information.

## 2019-11-01 NOTE — Consult Note (Signed)
NAME:  Marco Pittman, MRN:  [id number], DOB:  [birth year], LOS: 2 ADMISSION DATE:  10/30/2019, CONSULTATION DATE: 10/31/2019 REFERRING MD: Dr. Toniann Fail, CHIEF COMPLAINT: Multifactorial respiratory failure  Brief History   59 yo male smoker presented to ER with worsening shortness of breath and chest discomfort.  Found to have severe hypertension with SBP > 200.  CT chest showed b/l PE and RLL ASD with mucus plugging concerning for aspiration pneumonia.  Past Medical History  ETOH, HTN, Pancreatitis, ACE inhibitor induced angioedema July 2021, Aneurysm  Significant Hospital Events   7/26 Admit, start heparin gtt, VDRF, bronchoscopy  Consults:  Cardiology >> s/o 7/27  Procedures:  ETT 7/26 >>   Significant Diagnostic Tests:   CT-PA 7/26 >> bilateral segmental and subsegmental PE without any evidence for right heart strain, debris in the right lower lobe airway with right focal lower lobe airspace disease and collapse.  Asymmetric enlargement of thyroid  Bronchoscopy 7/26 >> Therapeutic aspiration performed in RLL and RML which were fully occluded.There was thick secretions fully occluding the RLL and RML opening. They were tan and bloody. Freely suctioned clear with the bronchoscope.   Echo 7/26 >> EF 60 to 65%, mild LVH, PASP 35.3 mmHg  Micro Data:  SARSCoV2 7/25 >> negative Respiratory 7/25 >>   Antimicrobials:  Azithromycin 7/25 Ceftriaxone 7/25 Vancomycin 7/26 >>  Zosyn 7/26 >>  Interim history/subjective:  Remains on sedation, vent.  Objective   Blood pressure 112/80, pulse 74, temperature (!) 96.1 F (35.6 C), temperature source Core, resp. rate 16, weight 69.1 kg, SpO2 100 %.    Vent Mode: PRVC FiO2 (%):  [40 %-100 %] 40 % Set Rate:  [16 bmp] 16 bmp Vt Set:  [570 mL] 570 mL PEEP:  [5 cmH20] 5 cmH20 Plateau Pressure:  [14 cmH20-17 cmH20] 16 cmH20   Intake/Output Summary (Last 24 hours) at 11/01/2019 0934 Last data filed at 11/01/2019 0600 Gross per 24 hour    Intake 1293.33 ml  Output 300 ml  Net 993.33 ml   Filed Weights   10/31/19 2200  Weight: 69.1 kg    Examination:  General - sedated Eyes - pupils reactive ENT - ETT in place Cardiac - regular rate/rhythm, no murmur Chest - scattered rhonchi Abdomen - soft, non tender, + bowel sounds Extremities - no cyanosis, clubbing, or edema Skin - no rashes Neuro - RASS -1, follows commands, moves all extremities   Resolved Hospital Problem list     Assessment & Plan:   Acute hypoxic respiratory failure. - from acute PE, aspiration pneumonia and mucus plugging - pressure support wean  Acute pulmonary embolism. - continue heparin gtt for now  Aspiration pneumonia with mucus plugging. - s/p bronchoscopy 7/26 - f/u CXR - bronchial hygiene  Hypertensive emergency. - improved - continue coreg, norvasc  Goiter. - will need assessment as outpt   Best practice:  Diet: NPO DVT prophylaxis: heparin infusion GI prophylaxis: pepcid Mobility: Bedrest Code Status: Full Disposition: ICU  Labs    CMP Latest Ref Rng & Units 10/31/2019 10/31/2019 10/31/2019  Glucose 70 - 99 mg/dL - 315(V) -  BUN 8 - 23 mg/dL - 19 -  Creatinine 7.61 - 1.24 mg/dL - 6.07 3.71  Sodium 062 - 145 mmol/L 146(H) 141 -  Potassium 3.5 - 5.1 mmol/L 4.0 3.5 -  Chloride 98 - 111 mmol/L - 104 -  CO2 22 - 32 mmol/L - 25 -  Calcium 8.9 - 10.3 mg/dL - 8.6(L) -  Total Protein 6.5 -  8.1 g/dL - 7.2 -  Total Bilirubin 0.3 - 1.2 mg/dL - 0.9 -  Alkaline Phos 38 - 126 U/L - 75 -  AST 15 - 41 U/L - 52(H) -  ALT 0 - 44 U/L - 65(H) -    CBC Latest Ref Rng & Units 11/01/2019 10/31/2019 10/31/2019  WBC 4.0 - 10.5 K/uL 9.6 - 15.5(H)  Hemoglobin 13.0 - 17.0 g/dL 11.9(L) 13.3 14.6  Hematocrit 39 - 52 % 35.0(L) 39.0 43.9  Platelets 150 - 400 K/uL 218 - 243    ABG    Component Value Date/Time   PHART 7.412 10/31/2019 1736   PCO2ART 49.7 (H) 10/31/2019 1736   PO2ART 298 (H) 10/31/2019 1736   HCO3 31.7 (H) 10/31/2019  1736   TCO2 33 (H) 10/31/2019 1736   O2SAT 100.0 10/31/2019 1736    CBG (last 3)  Recent Labs    10/31/19 2330 11/01/19 0325 11/01/19 0742  GLUCAP 111* 118* 122*    Critical care time: 34 minutes  Coralyn Helling, MD [facility] Pager - [phone] 11/01/2019, 9:43 AM

## 2019-11-01 NOTE — Progress Notes (Signed)
Wasted of Fentanyl in Steri Cycle with [employer], RN.

## 2019-11-01 NOTE — Progress Notes (Signed)
Progress Note  Patient Name: Marco Pittman Date of Encounter: 11/01/2019  [facility] Cardiologist: Buford Dresser, MD   Subjective   Intubated yesterday afternoon, s/p bronch with mucus plugging (tan/thick secretions). He is intubated but awake and alert this Am. Able to nod head to questions. No chest pain. Wants tube out.  Inpatient Medications    Scheduled Meds: . amLODipine  10 mg Oral Daily  . carvedilol  3.125 mg Oral BID WC  . chlorhexidine gluconate (MEDLINE KIT)  15 mL Mouth Rinse BID  . Chlorhexidine Gluconate Cloth  6 each Topical Daily  . docusate  100 mg Per Tube BID  . folic acid  1 mg Oral Daily  . ipratropium-albuterol  3 mL Nebulization Q6H  . mouth rinse  15 mL Mouth Rinse 10 times per day  . multivitamin with minerals  1 tablet Oral Daily  . pantoprazole  40 mg Oral Daily  . polyethylene glycol  17 g Per Tube Daily  . thiamine  100 mg Oral Daily   Or  . thiamine  100 mg Intravenous Daily  . ursodiol  600 mg Oral TID   Continuous Infusions: . famotidine (PEPCID) IV Stopped (10/31/19 2244)  . fentaNYL infusion INTRAVENOUS 200 mcg/hr (11/01/19 0053)  . heparin 1,600 Units/hr (11/01/19 0348)  . nitroGLYCERIN Stopped (10/31/19 1618)  . piperacillin-tazobactam (ZOSYN)  IV 3.375 g (11/01/19 0844)  . propofol (DIPRIVAN) infusion 30 mcg/kg/min (11/01/19 0628)  . vancomycin 1,000 mg (11/01/19 0627)   PRN Meds: albuterol, fentaNYL, LORazepam **OR** LORazepam, ondansetron **OR** ondansetron (ZOFRAN) IV   Vital Signs    Vitals:   11/01/19 0700 11/01/19 0715 11/01/19 0754 11/01/19 0913  BP: 97/71 105/83 (!) 123/93 112/80  Pulse: 53 55 74   Resp: 16 16    Temp:  (!) 96.1 F (35.6 C)    TempSrc:  Core    SpO2: 100% 100%    Weight:        Intake/Output Summary (Last 24 hours) at 11/01/2019 0928 Last data filed at 11/01/2019 0600 Gross per 24 hour  Intake 1293.33 ml  Output 300 ml  Net 993.33 ml   Last 3 Weights 10/31/2019 10/24/2019  10/24/2019  Weight (lbs) 152 lb 5.4 oz 175 lb 9.6 oz 183 lb 6.8 oz  Weight (kg) 69.1 kg 79.652 kg 83.2 kg      Telemetry    NSR/sinus bradycardia - Personally Reviewed  ECG    Most recent 10/31/19 sinus tachycardia at 101 bpm - Personally Reviewed  Physical Exam   GEN: Intubated but awake, alert, responsive Neck: No JVD Cardiac: RRR, no murmurs, rubs, or gallops.  Respiratory: Diffusely coarse and rhonchorous, worse at bases GI: Soft, nontender, non-distended  MS: No edema; No deformity. Neuro:  Nonfocal  Psych: Normal affect   Labs    High Sensitivity Troponin:   Recent Labs  Lab 10/20/19 1710 10/30/19 2124 10/31/19 0104 10/31/19 1308 10/31/19 1501  TROPONINIHS 8 15 736* 454* 516*      Chemistry Recent Labs  Lab 10/26/19 0211 10/26/19 0211 10/30/19 2124 10/30/19 2124 10/30/19 2345 10/31/19 0104 10/31/19 0421 10/31/19 1736  NA 141   < > 140   < > 144  --  141 146*  K 3.2*   < > 3.0*   < > 3.1*  --  3.5 4.0  CL 100  --  103  --   --   --  104  --   CO2 30  --  24  --   --   --  25  --   GLUCOSE 125*  --  219*  --   --   --  190*  --   BUN 23  --  16  --   --   --  19  --   CREATININE 0.95   < > 1.02  --   --  0.94 0.92  --   CALCIUM 9.5  --  8.8*  --   --   --  8.6*  --   PROT  --   --   --   --   --   --  7.2  --   ALBUMIN  --   --   --   --   --   --  2.9*  --   AST  --   --   --   --   --   --  52*  --   ALT  --   --   --   --   --   --  65*  --   ALKPHOS  --   --   --   --   --   --  75  --   BILITOT  --   --   --   --   --   --  0.9  --   GFRNONAA >60   < > >60  --   --  >60 >60  --   GFRAA >60   < > >60  --   --  >60 >60  --   ANIONGAP 11  --  13  --   --   --  12  --    < > = values in this interval not displayed.     Hematology Recent Labs  Lab 10/31/19 0104 10/31/19 0104 10/31/19 1308 10/31/19 1736 11/01/19 0247  WBC 16.2*  --  15.5*  --  9.6  RBC 4.53  --  4.66  --  3.83*  HGB 13.9   < > 14.6 13.3 11.9*  HCT 41.4   < > 43.9 39.0  35.0*  MCV 91.4  --  94.2  --  91.4  MCH 30.7  --  31.3  --  31.1  MCHC 33.6  --  33.3  --  34.0  RDW 13.0  --  13.2  --  12.9  PLT 213  --  243  --  218   < > = values in this interval not displayed.    BNP Recent Labs  Lab 10/30/19 2126  BNP 91.7     DDimer  Recent Labs  Lab 10/31/19 0104  DDIMER 3.17*     Radiology    CT ANGIO CHEST PE W OR WO CONTRAST  Result Date: 10/31/2019 CLINICAL DATA:  Shortness of breath.  Concern for PE. EXAM: CT ANGIOGRAPHY CHEST WITH CONTRAST TECHNIQUE: Multidetector CT imaging of the chest was performed using the standard protocol during bolus administration of intravenous contrast. Multiplanar CT image reconstructions and MIPs were obtained to evaluate the vascular anatomy. CONTRAST:  115m OMNIPAQUE IOHEXOL 350 MG/ML SOLN COMPARISON:  X-ray from same day. FINDINGS: Cardiovascular: Contrast injection is sufficient to demonstrate satisfactory opacification of the pulmonary arteries to the segmental level. There are acute segmental and subsegmental pulmonary emboli involving the bilateral lower lobes. The size of the main pulmonary artery is normal. Heart size is normal, with no pericardial effusion. There is no definite CT evidence for right-sided heart strain. There are minimal atherosclerotic changes of the thoracic  aorta. There is no evidence for an aneurysm. Mediastinum/Nodes: -- No mediastinal lymphadenopathy. -- No hilar lymphadenopathy. -- No axillary lymphadenopathy. -- No supraclavicular lymphadenopathy. --there is asymmetric enlargement of the right thyroid gland which extends into the substernal region. Thyroid gland is heterogeneous. -  Unremarkable esophagus. Lungs/Pleura: There is extensive debris within the right lower lobe bronchus. There is near complete collapse of the right lower lobe. There are nonenhancing portions of the right lower lobe concerning for aspiration pneumonia. There are airspace opacities in the left lower lobe, which may  be related to pneumonia, aspiration, or pulmonary infarcts. There is no pneumothorax. Upper Abdomen: Contrast bolus timing is not optimized for evaluation of the abdominal organs. The visualized portions of the organs of the upper abdomen are normal. Musculoskeletal: No chest wall abnormality. No bony spinal canal stenosis. Review of the MIP images confirms the above findings. IMPRESSION: 1. Bilateral segmental and subsegmental pulmonary emboli involving the lower lobes. No CT evidence for heart strain. 2. Findings are concerning for aspiration with complete collapse of the right lower lobe and probable developing aspiration pneumonia within the collapsed lung. 3. Airspace opacities in the left lower lobe are concerning for aspiration, pneumonia, or developing pulmonary infarcts in the setting of acute pulmonary emboli. 4. Asymmetric, heterogeneous enlargement of the thyroid gland. Outpatient follow-up thyroid ultrasound is recommended for further evaluation of this finding.(Ref: J Am Coll Radiol. 2015 Feb;12(2): 143-50). These results were called by telephone at the time of interpretation on 10/31/2019 at 2:38 am to provider Cardama, who verbally acknowledged these results. Electronically Signed   By: Constance Holster M.D.   On: 10/31/2019 02:43   DG CHEST PORT 1 VIEW  Result Date: 11/01/2019 CLINICAL DATA:  Intubation. EXAM: PORTABLE CHEST 1 VIEW COMPARISON:  10/31/2019. FINDINGS: Endotracheal tube and NG tube in stable position. Cardiomegaly with mild pulmonary venous congestion. Mild bilateral interstitial prominence. Mild CHF cannot be excluded. Persistent bibasilar atelectasis. Mild left base infiltrate cannot be excluded. No pleural effusion or pneumothorax. Stable elevation right hemidiaphragm. IMPRESSION: 1.  Endotracheal tube and NG tube in stable position. 2. Cardiomegaly with mild pulmonary venous congestion and mild bilateral interstitial prominence suggesting mild CHF. 3. Persistent bibasilar  atelectasis. Left base infiltrate cannot be excluded. Electronically Signed   By: Marcello Moores  Register   On: 11/01/2019 06:03   DG Chest Portable 1 View  Result Date: 10/31/2019 CLINICAL DATA:  Post intubation EXAM: PORTABLE CHEST 1 VIEW COMPARISON:  10/30/2019 FINDINGS: Interval placement of endotracheal tube, tip projecting over the mid trachea. Interval placement of esophagogastric tube, tip and side port below the diaphragm. Heterogeneous airspace opacity of the bilateral lung bases, possibly with small associated layering pleural effusions. Heart and mediastinum are normal. IMPRESSION: 1. Interval placement of endotracheal tube, tip projecting over the mid trachea. 2. Interval placement of esophagogastric tube, tip and side port below the diaphragm. 3. Heterogeneous airspace opacity of the bilateral lung bases, possibly with small associated layering pleural effusions, in keeping with infection or aspiration. Electronically Signed   By: Eddie Candle M.D.   On: 10/31/2019 16:59   DG Chest Portable 1 View  Result Date: 10/30/2019 CLINICAL DATA:  Shortness of breath EXAM: PORTABLE CHEST 1 VIEW COMPARISON:  October 20, 2019 FINDINGS: There are hazy airspace opacities at the lung bases bilaterally which are new since prior study. There is no pneumothorax. There may be a small right-sided pleural effusion. The heart size is stable. There is no acute osseous abnormality. IMPRESSION: 1. New hazy bibasilar airspace  opacities concerning for pneumonia or atelectasis. 2. Possible small right-sided pleural effusion. Electronically Signed   By: Constance Holster M.D.   On: 10/30/2019 21:35   ECHOCARDIOGRAM LIMITED  Result Date: 10/31/2019    ECHOCARDIOGRAM LIMITED REPORT   Patient Name:   Marco Pittman [facility] Date of Exam: 10/31/2019 Medical Rec #:  [id number]          Height:       69.5 in Accession #:    [id number]         Weight:       175.6 lb Date of Birth:  [birth year]           BSA:          1.965 m Patient Age:    48  years           BP:           124/66 mmHg Patient Gender: M                  HR:           91 bpm. Exam Location:  Inpatient Procedure: Limited Echo, Cardiac Doppler and Color Doppler Indications:    Elevated Troponin  History:        Patient has prior history of Echocardiogram examinations, most                 recent 10/15/2019. Pul. emboli, Signs/Symptoms:elevated troponin,                 Shortness of Breath and Chest Pain; Risk Factors:Current Smoker.                 Resp. failure, ETOH abuse, pneumonia.  Sonographer:    Dustin Flock Referring Phys: [place]  1. Left ventricular ejection fraction, by estimation, is 60 to 65%. The left ventricle has normal function. The left ventricle has no regional wall motion abnormalities. There is mild concentric left ventricular hypertrophy.  2. Right ventricular systolic function is normal. The right ventricular size is normal. There is normal pulmonary artery systolic pressure. The estimated right ventricular systolic pressure is 37.4 mmHg.  3. The mitral valve is grossly normal. No evidence of mitral valve regurgitation. No evidence of mitral stenosis.  4. The aortic valve is grossly normal.  5. The inferior vena cava is normal in size with greater than 50% respiratory variability, suggesting right atrial pressure of 3 mmHg. Comparison(s): No significant change from prior study. FINDINGS  Left Ventricle: Left ventricular ejection fraction, by estimation, is 60 to 65%. The left ventricle has normal function. The left ventricle has no regional wall motion abnormalities. The left ventricular internal cavity size was normal in size. There is  mild concentric left ventricular hypertrophy. Right Ventricle: The right ventricular size is normal. No increase in right ventricular wall thickness. Right ventricular systolic function is normal. There is normal pulmonary artery systolic pressure. The tricuspid regurgitant velocity is 2.84 m/s, and   with an assumed right atrial pressure of 3 mmHg, the estimated right ventricular systolic pressure is 82.7 mmHg. Pericardium: Trivial pericardial effusion is present. Mitral Valve: The mitral valve is grossly normal. No evidence of mitral valve stenosis. Tricuspid Valve: The tricuspid valve is grossly normal. Tricuspid valve regurgitation is trivial. No evidence of tricuspid stenosis. Aortic Valve: The aortic valve is grossly normal. Aorta: The aortic root is normal in size and structure. Venous: The inferior vena cava is normal in size with greater than 50%  respiratory variability, suggesting right atrial pressure of 3 mmHg. LEFT VENTRICLE PLAX 2D LVIDd:         4.30 cm Diastology LVIDs:         3.20 cm LV e' lateral: 9.46 cm/s LV PW:         1.20 cm LV e' medial:  8.38 cm/s LV IVS:        1.20 cm  TRICUSPID VALVE TR Peak grad:   32.3 mmHg TR Vmax:        284.00 cm/s Eleonore Chiquito MD Electronically signed by Eleonore Chiquito MD Signature Date/Time: 10/31/2019/3:49:52 PM    Final     Cardiac Studies   Echo 7.26.21 1. Left ventricular ejection fraction, by estimation, is 60 to 65%. The  left ventricle has normal function. The left ventricle has no regional  wall motion abnormalities. There is mild concentric left ventricular  hypertrophy.  2. Right ventricular systolic function is normal. The right ventricular  size is normal. There is normal pulmonary artery systolic pressure. The  estimated right ventricular systolic pressure is 85.9 mmHg.  3. The mitral valve is grossly normal. No evidence of mitral valve  regurgitation. No evidence of mitral stenosis.  4. The aortic valve is grossly normal.  5. The inferior vena cava is normal in size with greater than 50%  respiratory variability, suggesting right atrial pressure of 3 mmHg.   Patient Profile     59 y.o. male with PMH hypertension, tobacco abuse, angioedema on ACEi who we are following in consult for elevated troponin at the request of Dr.  Lamonte Sakai.  Assessment & Plan    Acute hypoxic respiratory failure: -intubated 7.26.21. Awake but intubated this AM. Per primary team on plans for extubation. -pneumonia with mucus plugging s/p bronchoscopy 7.26.21. procalcitonin 0.77. On zosyn. -bilateral segmental/subsegmental PE on heparin. D dimer 3.17 -echo with normal function/wall motion. RV function normal, PA pressure not significantly elevated.  Hypertension: -since intubation/sedation, running controlled to hypotensive -continue amlodipine 10 mg daily, carvedilol 3.125 mg BID unless he is persistently hypotensive  Elevated troponin: -given respiratory failure, suspect this is demand given normal EF/wall motion -trend 15, 736, 454, 516 -on heparin for PE. No acute indication for cath  [facility] will sign off at this time, but please do not hesitate to contact us with any further questions.   Medication Recommendations:  No changes to cardiac meds. Will need anticoagulation for PE, choice per primary team Other recommendations (labs, testing, etc):  none Follow up as an outpatient:  He can be followed as needed in cardiology clinic.  For questions or updates, please contact [place] Please consult [email or web address] for contact info under        Signed, Buford Dresser, MD  11/01/2019, 9:28 AM

## 2019-11-01 NOTE — Procedures (Signed)
Intubation Procedure Note  Marco Pittman  [id number]  Sep 08, 1960  Date:11/01/19  Time:12:38 PM   Provider Performing:Camisha Srey    Procedure: Intubation (31500)  Indication(s) Respiratory Failure  Consent Unable to obtain consent due to emergent nature of procedure.   Anesthesia Etomidate, Versed and Rocuronium   Time Out Verified patient identification, verified procedure, site/side was marked, verified correct patient position, special equipment/implants available, medications/allergies/relevant history reviewed, required imaging and test results available.   Sterile Technique Usual hand hygeine, masks, and gloves were used   Procedure Description Patient positioned in bed supine.  Sedation given as noted above.  Patient was intubated with endotracheal tube using Glidescope.  View was Grade 3 only epiglottis .  Number of attempts was 1.  Colorimetric CO2 detector was consistent with tracheal placement.   Complications/Tolerance None; patient tolerated the procedure well. Chest X-ray is ordered to verify placement.   EBL Minimal   Specimen(s) None

## 2019-11-01 NOTE — Progress Notes (Signed)
Extubated earlier this morning w/o difficulty.  Then developed throat irritation and upper airway wheezing.  Progressed to altered mental status and minimal air movement.  Required intubation.  After intubation wheezing resolved.  Noted to have edema of upper airway during laryngoscopy for intubation.  Will add decadron.  Coralyn Helling, MD [facility] Pulmonary/Critical Care Pager - [phone] 11/01/2019, 1:03 PM

## 2019-11-01 NOTE — Progress Notes (Signed)
ANTICOAGULATION CONSULT NOTE  Pharmacy Consult for heparin Indication: pulmonary embolus  Allergies  Allergen Reactions  . Lisinopril Swelling    Angioedema    Patient Measurements: Weight: 69.1 kg (152 lb 5.4 oz) Heparin Dosing Weight: 75 kg  Vital Signs: Temp: 96.1 F (35.6 C) (07/27 0715) Temp Source: Core (07/27 0715) BP: 121/88 (07/27 1000) Pulse Rate: 73 (07/27 1000)  Labs: Recent Labs     0000 10/30/19 2124 10/30/19 2345 10/31/19 0104 10/31/19 0421 10/31/19 1043 10/31/19 1308 10/31/19 1308 10/31/19 1501 10/31/19 1736 10/31/19 2154 11/01/19 0247 11/01/19 1008  HGB   < > 15.3   < > 13.9  --   --  14.6   < >  --  13.3  --  11.9*  --   HCT   < > 45.7   < > 41.4  --   --  43.9  --   --  39.0  --  35.0*  --   PLT  --  231   < > 213  --   --  243  --   --   --   --  218  --   HEPARINUNFRC  --   --   --   --   --    < >  --   --   --   --  0.25* 0.27* 0.39  CREATININE  --  1.02  --  0.94 0.92  --   --   --   --   --   --   --   --   TROPONINIHS  --  15   < > 736*  --   --  454*  --  516*  --   --   --   --    < > = values in this interval not displayed.    Estimated Creatinine Clearance: 82.4 mL/min (by C-G formula based on SCr of 0.92 mg/dL).  Assessment: 22 yom admitted with acute PE, no RHS. Pharmacy consulted to dose heparin. Heparin level is now therapeutic; no bleeding reported.  Goal of Therapy:  Heparin level 0.3-0.7 units/ml Monitor platelets by anticoagulation protocol: Yes   Plan:  Increase heparin gtt slightly to 1650 units/hr Daily heparin level and CBC  Werner Labella D. Laney Potash, PharmD, BCPS, BCCCP 11/01/2019, 11:04 AM

## 2019-11-01 NOTE — Progress Notes (Signed)
ANTICOAGULATION CONSULT NOTE - Follow Up Consult  Pharmacy Consult for heparin Indication: pulmonary embolus  Labs: Recent Labs    10/30/19 2124 10/30/19 2345 10/31/19 0104 10/31/19 0104 10/31/19 0421 10/31/19 1043 10/31/19 1308 10/31/19 1501 10/31/19 1736 10/31/19 2154 11/01/19 0247  HGB 15.3   < > 13.9   < >  --   --  14.6  --  13.3  --   --   HCT 45.7   < > 41.4  --   --   --  43.9  --  39.0  --   --   PLT 231  --  213  --   --   --  243  --   --   --   --   HEPARINUNFRC  --   --   --   --   --  0.46  --   --   --  0.25* 0.27*  CREATININE 1.02  --  0.94  --  0.92  --   --   --   --   --   --   TROPONINIHS 15   < > 736*  --   --   --  454* 516*  --   --   --    < > = values in this interval not displayed.    Assessment: 59yo male remains subtherapeutic on heparin with small increase in level after rate change; no gtt issues or signs of bleeding per RN.  Goal of Therapy:  Heparin level 0.3-0.7 units/ml   Plan:  Will increase heparin gtt by 2 units/kg/hr to 1600 units/hr and check level in 6 hours.    Vernard Gambles, PharmD, BCPS  11/01/2019,3:31 AM

## 2019-11-01 NOTE — Progress Notes (Addendum)
Wasted 33m of etomidate Wasted 144mof Succinylcholine From RSI kit  In steri cycle with CaAngela NevinRN

## 2019-11-01 NOTE — Procedures (Signed)
Extubation Procedure Note  Patient Details:   Name: Marco Pittman DOB: 1960-04-11 MRN: [id number]   Airway Documentation:    Vent end date: 11/01/19 Vent end time: 1117   Evaluation  O2 sats: stable throughout Complications: No apparent complications Patient did tolerate procedure well. Bilateral Breath Sounds: Clear, Diminished   Yes   Patient was extubated to a 4L [place]. Cuff leak was heard. No stridor was noted. RN at bedside with RT. Patient tolerated well.  Danella Maiers [facility] 11/01/2019, 11:21 AM

## 2019-11-02 ENCOUNTER — Inpatient Hospital Stay (HOSPITAL_COMMUNITY): Payer: Medicaid Other

## 2019-11-02 DIAGNOSIS — J9601 Acute respiratory failure with hypoxia: Secondary | ICD-10-CM | POA: Diagnosis not present

## 2019-11-02 LAB — CBC
HCT: 37.2 % — ABNORMAL LOW (ref 39.0–52.0)
Hemoglobin: 13.2 g/dL (ref 13.0–17.0)
MCH: 32.1 pg (ref 26.0–34.0)
MCHC: 35.5 g/dL (ref 30.0–36.0)
MCV: 90.5 fL (ref 80.0–100.0)
Platelets: 235 10*3/uL (ref 150–400)
RBC: 4.11 MIL/uL — ABNORMAL LOW (ref 4.22–5.81)
RDW: 13.1 % (ref 11.5–15.5)
WBC: 10.7 10*3/uL — ABNORMAL HIGH (ref 4.0–10.5)
nRBC: 0 % (ref 0.0–0.2)

## 2019-11-02 LAB — GLUCOSE, CAPILLARY
Glucose-Capillary: 113 mg/dL — ABNORMAL HIGH (ref 70–99)
Glucose-Capillary: 134 mg/dL — ABNORMAL HIGH (ref 70–99)
Glucose-Capillary: 135 mg/dL — ABNORMAL HIGH (ref 70–99)
Glucose-Capillary: 150 mg/dL — ABNORMAL HIGH (ref 70–99)
Glucose-Capillary: 173 mg/dL — ABNORMAL HIGH (ref 70–99)
Glucose-Capillary: 184 mg/dL — ABNORMAL HIGH (ref 70–99)
Glucose-Capillary: 205 mg/dL — ABNORMAL HIGH (ref 70–99)

## 2019-11-02 LAB — PHOSPHORUS
Phosphorus: 2.3 mg/dL — ABNORMAL LOW (ref 2.5–4.6)
Phosphorus: 2.3 mg/dL — ABNORMAL LOW (ref 2.5–4.6)

## 2019-11-02 LAB — BASIC METABOLIC PANEL
Anion gap: 8 (ref 5–15)
BUN: 18 mg/dL (ref 8–23)
CO2: 25 mmol/L (ref 22–32)
Calcium: 8.8 mg/dL — ABNORMAL LOW (ref 8.9–10.3)
Chloride: 113 mmol/L — ABNORMAL HIGH (ref 98–111)
Creatinine, Ser: 1.01 mg/dL (ref 0.61–1.24)
GFR calc Af Amer: >60 mL/min (ref >60)
GFR calc non Af Amer: >60 mL/min (ref >60)
Glucose, Bld: 162 mg/dL — ABNORMAL HIGH (ref 70–99)
Potassium: 3.5 mmol/L (ref 3.5–5.1)
Sodium: 146 mmol/L — ABNORMAL HIGH (ref 135–145)

## 2019-11-02 LAB — MAGNESIUM
Magnesium: 2.4 mg/dL (ref 1.7–2.4)
Magnesium: 2.5 mg/dL — ABNORMAL HIGH (ref 1.7–2.4)

## 2019-11-02 LAB — HEPARIN LEVEL (UNFRACTIONATED): Heparin Unfractionated: 0.61 IU/mL (ref 0.30–0.70)

## 2019-11-02 MED ORDER — POTASSIUM & SODIUM PHOSPHATES 280-160-250 MG PO PACK
1.0000 | PACK | Freq: Three times a day (TID) | ORAL | Status: AC
  Administered 2019-11-02 (×3): 1
  Filled 2019-11-02 (×3): qty 1

## 2019-11-02 MED ORDER — POTASSIUM CHLORIDE 20 MEQ PO PACK
40.0000 meq | PACK | Freq: Once | ORAL | Status: AC
  Administered 2019-11-02: 40 meq
  Filled 2019-11-02: qty 2

## 2019-11-02 MED ORDER — FREE WATER
200.0000 mL | Freq: Four times a day (QID) | Status: DC
  Administered 2019-11-02 – 2019-11-03 (×4): 200 mL

## 2019-11-02 MED ORDER — INSULIN ASPART 100 UNIT/ML ~~LOC~~ SOLN
0.0000 [IU] | SUBCUTANEOUS | Status: DC
  Administered 2019-11-02: 3 [IU] via SUBCUTANEOUS
  Administered 2019-11-02: 4 [IU] via SUBCUTANEOUS
  Administered 2019-11-03 (×3): 3 [IU] via SUBCUTANEOUS
  Administered 2019-11-03: 4 [IU] via SUBCUTANEOUS
  Administered 2019-11-04 (×2): 3 [IU] via SUBCUTANEOUS

## 2019-11-02 NOTE — Progress Notes (Signed)
ANTICOAGULATION CONSULT NOTE  Pharmacy Consult for heparin Indication: pulmonary embolus  Allergies  Allergen Reactions  . Lisinopril Swelling    Angioedema    Patient Measurements: Weight: 72.9 kg (160 lb 11.5 oz) Heparin Dosing Weight: 75 kg  Vital Signs: Temp: 98.5 F (36.9 C) (07/28 0812) Temp Source: Oral (07/28 0403) BP: 115/76 (07/28 0815) Pulse Rate: 83 (07/28 0827)  Labs: Recent Labs     0000 10/31/19 0104 10/31/19 0421 10/31/19 1043 10/31/19 1308 10/31/19 1501 10/31/19 1736 11/01/19 0247 11/01/19 0247 11/01/19 1008 11/01/19 1337 11/02/19 0127  HGB   < > 13.9  --   --  14.6  --    < > 11.9*   < >  --  14.6 13.2  HCT   < > 41.4  --   --  43.9  --    < > 35.0*  --   --  43.0 37.2*  PLT   < > 213  --   --  243  --   --  218  --   --   --  235  HEPARINUNFRC  --   --   --    < >  --   --    < > 0.27*  --  0.39  --  0.61  CREATININE  --  0.94 0.92  --   --   --   --   --   --   --   --  1.01  TROPONINIHS  --  736*  --   --  454* 516*  --   --   --   --   --   --    < > = values in this interval not displayed.    Estimated Creatinine Clearance: 78.1 mL/min (by C-G formula based on SCr of 1.01 mg/dL).  Assessment: 33 yom admitted with acute PE, no RHS. Pharmacy consulted to dose heparin. Heparin level is therapeutic; no bleeding reported.  Goal of Therapy:  Heparin level 0.3-0.7 units/ml Monitor platelets by anticoagulation protocol: Yes   Plan:  Continue heparin gtt at 1650 units/hr Daily heparin level and CBC  Lateef Juncaj D. Laney Potash, PharmD, BCPS, BCCCP 11/02/2019, 10:30 AM

## 2019-11-02 NOTE — Progress Notes (Signed)
NAME:  Marco Pittman, MRN:  [id number], DOB:  1960/06/19, LOS: 3 ADMISSION DATE:  10/30/2019, CONSULTATION DATE: 10/31/2019 REFERRING MD: Dr. Toniann Fail, CHIEF COMPLAINT: Multifactorial respiratory failure  Brief History   59 yo male smoker presented to ER with worsening shortness of breath and chest discomfort.  Found to have severe hypertension with SBP > 200.  CT chest showed b/l PE and RLL ASD with mucus plugging concerning for aspiration pneumonia.  Past Medical History  ETOH, HTN, Pancreatitis, ACE inhibitor induced angioedema July 2021, Aneurysm  Significant Hospital Events   7/26 Admit, start heparin gtt, VDRF, bronchoscopy 7/27 Extubated >> required reintubation due to stridor; start decadron  Consults:  Cardiology >> s/o 7/27  Procedures:  ETT 7/26 >> 7/27 ETT 7/27 >>  Significant Diagnostic Tests:   CT-PA 7/26 >> acute b/l segmental and subsegmental PE, asymmetric enlargement of Rt thyroid gland extending into substernal region, debris in RLL bronchus with pneumonitis, LLL ASD  Bronchoscopy 7/26 >> Therapeutic aspiration performed in RLL and RML which were fully occluded.There was thick secretions fully occluding the RLL and RML opening. They were tan and bloody. Freely suctioned clear with the bronchoscope.   Echo 7/26 >> EF 60 to 65%, mild LVH, PASP 35.3 mmHg  Micro Data:  SARSCoV2 7/25 >> negative Respiratory 7/25 >>   Antimicrobials:  Azithromycin 7/25 Ceftriaxone 7/25 Vancomycin 7/26 >>  Zosyn 7/26 >>  Interim history/subjective:  Required reintubation yesterday.  Doing pressure support this AM.  Objective   Blood pressure 115/76, pulse 83, temperature 98.5 F (36.9 C), resp. rate (!) 35, weight 72.9 kg, SpO2 100 %.    Vent Mode: PSV;CPAP FiO2 (%):  [40 %-100 %] 40 % Set Rate:  [16 bmp] 16 bmp Vt Set:  [570 mL] 570 mL PEEP:  [5 cmH20] 5 cmH20 Pressure Support:  [5 cmH20-8 cmH20] 8 cmH20 Plateau Pressure:  [14 cmH20-17 cmH20] 14 cmH20    Intake/Output Summary (Last 24 hours) at 11/02/2019 0954 Last data filed at 11/02/2019 0800 Gross per 24 hour  Intake 2355.95 ml  Output 2250 ml  Net 105.95 ml   Filed Weights   10/31/19 2200 11/02/19 0500  Weight: 69.1 kg 72.9 kg    Examination:  General - alert Eyes - pupils reactive ENT - ETT in place Cardiac - regular rate/rhythm, no murmur Chest - equal breath sounds b/l, no wheezing or rales Abdomen - soft, non tender, + bowel sounds Extremities - no cyanosis, clubbing, or edema Skin - no rashes Neuro - RASS 0   Resolved Hospital Problem list   Hypertensive emergency  Assessment & Plan:   Acute hypoxic respiratory failure. - from acute PE, aspiration pneumonia and mucus plugging - continue pressure support as tolerated - scheduled duoneb  Post extubation stridor. - likely from recent episode of angioedema and intubations causing upper airway edema - re-intubated 7/27 - continue decadron  Acute pulmonary embolism. - continue heparin gtt for now  Aspiration pneumonia with mucus plugging. - s/p bronchoscopy 7/26 - day 4 of ABx; continue zosyn, d/c vancomycin - f/u CXR - bronchial hygiene  Hypertension. - continue coreg, norvas  Goiter. - TSH 0.760 from 10/31/19 - if he continues to have difficult with extubation trials, then might need ENT assessment  Steroid induced hyperglycemia. - SSI   Best practice:  Diet: tube feeds DVT prophylaxis: heparin infusion GI prophylaxis: protonix Mobility: Bedrest Code Status: Full Disposition: ICU  Labs    CMP Latest Ref Rng & Units 11/02/2019 11/01/2019 10/31/2019  Glucose  70 - 99 mg/dL 299(M) - -  BUN 8 - 23 mg/dL 18 - -  Creatinine 4.26 - 1.24 mg/dL 8.34 - -  Sodium 196 - 145 mmol/L 146(H) 148(H) 146(H)  Potassium 3.5 - 5.1 mmol/L 3.5 3.0(L) 4.0  Chloride 98 - 111 mmol/L 113(H) - -  CO2 22 - 32 mmol/L 25 - -  Calcium 8.9 - 10.3 mg/dL 2.2(W) - -  Total Protein 6.5 - 8.1 g/dL - - -  Total Bilirubin  0.3 - 1.2 mg/dL - - -  Alkaline Phos 38 - 126 U/L - - -  AST 15 - 41 U/L - - -  ALT 0 - 44 U/L - - -    CBC Latest Ref Rng & Units 11/02/2019 11/01/2019 11/01/2019  WBC 4.0 - 10.5 K/uL 10.7(H) - 9.6  Hemoglobin 13.0 - 17.0 g/dL 97.9 89.2 11.9(L)  Hematocrit 39 - 52 % 37.2(L) 43.0 35.0(L)  Platelets 150 - 400 K/uL 235 - 218    ABG    Component Value Date/Time   PHART 7.379 11/01/2019 1337   PCO2ART 50.2 (H) 11/01/2019 1337   PO2ART 309 (H) 11/01/2019 1337   HCO3 29.8 (H) 11/01/2019 1337   TCO2 31 11/01/2019 1337   O2SAT 100.0 11/01/2019 1337    CBG (last 3)  Recent Labs    11/02/19 0024 11/02/19 0359 11/02/19 0742  GLUCAP 173* 134* 205*    Critical care time: 37 minutes  Coralyn Helling, MD  [place] Pulmonary/Critical Care Pager - [phone] 11/02/2019, 9:54 AM

## 2019-11-03 ENCOUNTER — Inpatient Hospital Stay (HOSPITAL_COMMUNITY): Payer: Medicaid Other

## 2019-11-03 DIAGNOSIS — J9601 Acute respiratory failure with hypoxia: Secondary | ICD-10-CM | POA: Diagnosis not present

## 2019-11-03 LAB — BASIC METABOLIC PANEL
Anion gap: 12 (ref 5–15)
BUN: 17 mg/dL (ref 8–23)
CO2: 22 mmol/L (ref 22–32)
Calcium: 8.6 mg/dL — ABNORMAL LOW (ref 8.9–10.3)
Chloride: 115 mmol/L — ABNORMAL HIGH (ref 98–111)
Creatinine, Ser: 1 mg/dL (ref 0.61–1.24)
GFR calc Af Amer: >60 mL/min (ref >60)
GFR calc non Af Amer: >60 mL/min (ref >60)
Glucose, Bld: 99 mg/dL (ref 70–99)
Potassium: 3.2 mmol/L — ABNORMAL LOW (ref 3.5–5.1)
Sodium: 149 mmol/L — ABNORMAL HIGH (ref 135–145)

## 2019-11-03 LAB — CBC
HCT: 36.9 % — ABNORMAL LOW (ref 39.0–52.0)
Hemoglobin: 12.7 g/dL — ABNORMAL LOW (ref 13.0–17.0)
MCH: 31.9 pg (ref 26.0–34.0)
MCHC: 34.4 g/dL (ref 30.0–36.0)
MCV: 92.7 fL (ref 80.0–100.0)
Platelets: 242 10*3/uL (ref 150–400)
RBC: 3.98 MIL/uL — ABNORMAL LOW (ref 4.22–5.81)
RDW: 13.4 % (ref 11.5–15.5)
WBC: 10.1 10*3/uL (ref 4.0–10.5)
nRBC: 0 % (ref 0.0–0.2)

## 2019-11-03 LAB — GLUCOSE, CAPILLARY
Glucose-Capillary: 102 mg/dL — ABNORMAL HIGH (ref 70–99)
Glucose-Capillary: 127 mg/dL — ABNORMAL HIGH (ref 70–99)
Glucose-Capillary: 140 mg/dL — ABNORMAL HIGH (ref 70–99)
Glucose-Capillary: 174 mg/dL — ABNORMAL HIGH (ref 70–99)
Glucose-Capillary: 69 mg/dL — ABNORMAL LOW (ref 70–99)
Glucose-Capillary: 86 mg/dL (ref 70–99)

## 2019-11-03 LAB — HEPARIN LEVEL (UNFRACTIONATED): Heparin Unfractionated: 0.55 IU/mL (ref 0.30–0.70)

## 2019-11-03 MED ORDER — RESOURCE THICKENUP CLEAR PO POWD
Freq: Once | ORAL | Status: AC
  Filled 2019-11-03: qty 125

## 2019-11-03 MED ORDER — ORAL CARE MOUTH RINSE
15.0000 mL | Freq: Two times a day (BID) | OROMUCOSAL | Status: DC
  Administered 2019-11-03 – 2019-11-05 (×4): 15 mL via OROMUCOSAL

## 2019-11-03 MED ORDER — DEXTROSE 50 % IV SOLN
25.0000 mL | Freq: Once | INTRAVENOUS | Status: AC
  Administered 2019-11-03: 25 mL via INTRAVENOUS

## 2019-11-03 MED ORDER — POTASSIUM CHLORIDE 10 MEQ/100ML IV SOLN
10.0000 meq | INTRAVENOUS | Status: AC
  Administered 2019-11-03 (×4): 10 meq via INTRAVENOUS
  Filled 2019-11-03 (×4): qty 100

## 2019-11-03 MED ORDER — CARVEDILOL 3.125 MG PO TABS
3.1250 mg | ORAL_TABLET | Freq: Two times a day (BID) | ORAL | Status: DC
  Administered 2019-11-03 – 2019-11-05 (×5): 3.125 mg via ORAL
  Filled 2019-11-03 (×6): qty 1

## 2019-11-03 MED ORDER — THIAMINE HCL 100 MG PO TABS
100.0000 mg | ORAL_TABLET | Freq: Every day | ORAL | Status: DC
  Administered 2019-11-04 – 2019-11-05 (×2): 100 mg via ORAL
  Filled 2019-11-03 (×2): qty 1

## 2019-11-03 MED ORDER — POTASSIUM CHLORIDE 20 MEQ/15ML (10%) PO SOLN
20.0000 meq | ORAL | Status: AC
  Administered 2019-11-03: 20 meq
  Filled 2019-11-03: qty 15

## 2019-11-03 MED ORDER — AMLODIPINE BESYLATE 10 MG PO TABS
10.0000 mg | ORAL_TABLET | Freq: Every day | ORAL | Status: DC
  Administered 2019-11-04 – 2019-11-05 (×2): 10 mg via ORAL
  Filled 2019-11-03 (×2): qty 1

## 2019-11-03 MED ORDER — DEXTROSE 50 % IV SOLN
INTRAVENOUS | Status: AC
  Filled 2019-11-03: qty 50

## 2019-11-03 MED ORDER — FOLIC ACID 1 MG PO TABS
1.0000 mg | ORAL_TABLET | Freq: Every day | ORAL | Status: DC
  Administered 2019-11-04 – 2019-11-05 (×2): 1 mg via ORAL
  Filled 2019-11-03 (×2): qty 1

## 2019-11-03 MED ORDER — ADULT MULTIVITAMIN W/MINERALS CH
1.0000 | ORAL_TABLET | Freq: Every day | ORAL | Status: DC
  Administered 2019-11-04 – 2019-11-05 (×2): 1 via ORAL
  Filled 2019-11-03 (×2): qty 1

## 2019-11-03 MED ORDER — PANTOPRAZOLE SODIUM 40 MG IV SOLR
40.0000 mg | INTRAVENOUS | Status: DC
  Administered 2019-11-03 – 2019-11-04 (×2): 40 mg via INTRAVENOUS
  Filled 2019-11-03 (×2): qty 40

## 2019-11-03 NOTE — Evaluation (Signed)
Clinical/Bedside Swallow Evaluation Patient Details  Name: Marco Pittman MRN: [id number] Date of Birth: 1961/03/22  Today's Date: 11/03/2019 Time: SLP Start Time (ACUTE ONLY): 1325 SLP Stop Time (ACUTE ONLY): 1340 SLP Time Calculation (min) (ACUTE ONLY): 15 min  Past Medical History:  Past Medical History:  Diagnosis Date  . Aneurysm (HCC)   . ETOH abuse   . Hypertension   . Pancreatitis 03/04/2016  . Umbilical hernia    symptomatic umbilical hernia/notes 03/04/2016   Past Surgical History:  Past Surgical History:  Procedure Laterality Date  . INSERTION OF MESH N/A 04/14/2016   Procedure: INSERTION OF MESH;  Surgeon: Avel Peace, MD;  Location:  SURGERY CENTER;  Service: General;  Laterality: N/A;  . UMBILICAL HERNIA REPAIR N/A 04/14/2016   Procedure: UMBILICAL HERNIA REPAIR WITH MESH;  Surgeon: Avel Peace, MD;  Location:  SURGERY CENTER;  Service: General;  Laterality: N/A;   HPI:  59 yo male smoker presented to ER 7/26 with worsening shortness of breath and chest discomfort.  Found to have severe hypertension with SBP > 200.  CT chest showed b/l PE and RLL ASD with mucus plugging concerning for aspiration pneumonia. PMH: ETOH, HTN, Pancreatitis, Aneurysm. ETT 7/26-27; reintubated 7/27-29 due to angioedema.    Assessment / Plan / Recommendation Clinical Impression  Pt presents with a likely reversible acute post-extubation dysphagia, marked by consistent coughing after drinking thin liquids; wet/hoarse voice.  Consumption of nectar liquids did not elicit similar signs of aspiration.  Pt consumed regular and soft solids without difficulty and self-described improved ability to swallow nectar liquids.  For today, start regular diet with nectar thick liquids. Explained to pt this will likely be a necessity in the short-term (24-48 hrs) until vocal folds recover from intubation.  He verbalized understanding.  SLP Visit Diagnosis: Dysphagia, pharyngeal phase  (R13.13)    Aspiration Risk  Mild aspiration risk    Diet Recommendation     Medication Administration: Whole meds with puree    Other  Recommendations Oral Care Recommendations: Oral care BID Other Recommendations: Order thickener from pharmacy   Follow up Recommendations None      Frequency and Duration min 2x/week  1 week       Prognosis Prognosis for Safe Diet Advancement: Good      Swallow Study   General Date of Onset: 10/31/19 HPI: 59 yo male smoker presented to ER 7/26 with worsening shortness of breath and chest discomfort.  Found to have severe hypertension with SBP > 200.  CT chest showed b/l PE and RLL ASD with mucus plugging concerning for aspiration pneumonia. PMH: ETOH, HTN, Pancreatitis, Aneurysm. ETT 7/26-27; reintubated 7/27-29 due to angioedema.  Type of Study: Bedside Swallow Evaluation Previous Swallow Assessment: no Diet Prior to this Study: NPO Temperature Spikes Noted: No Respiratory Status: Nasal cannula History of Recent Intubation: Yes Length of Intubations (days): 3 days (over two intubations) Date extubated: 11/03/19 Behavior/Cognition: Alert;Cooperative;Pleasant mood Oral Cavity Assessment: Within Functional Limits Oral Care Completed by SLP: No Oral Cavity - Dentition: Missing dentition;Adequate natural dentition Vision: Functional for self-feeding Self-Feeding Abilities: Able to feed self Patient Positioning: Upright in chair Baseline Vocal Quality: Hoarse Volitional Cough: Strong Volitional Swallow: Able to elicit    Oral/Motor/Sensory Function Overall Oral Motor/Sensory Function: Within functional limits   Ice Chips Ice chips: Within functional limits   Thin Liquid Thin Liquid: Impaired Presentation: Cup;Self Fed Pharyngeal  Phase Impairments: Wet Vocal Quality;Cough - Immediate    Nectar Thick Nectar Thick Liquid: Within  functional limits   Honey Thick Honey Thick Liquid: Not tested   Puree Puree: Within functional  limits Presentation: Self Fed;Spoon   Solid     Solid: Within functional limits Presentation: Self Fed      Samson Frederic, Geraldina Parrott Laurice 11/03/2019,1:51 PM   Marchelle Folks L. Samson Frederic, MA CCC/SLP Acute Rehabilitation Services Office number [phone] Pager [phone]

## 2019-11-03 NOTE — Progress Notes (Signed)
NAME:  Marco Pittman, MRN:  [id number], DOB:  06/13/1960, LOS: 4 ADMISSION DATE:  10/30/2019, CONSULTATION DATE: 10/31/2019 REFERRING MD: Dr. Toniann Fail, CHIEF COMPLAINT: Multifactorial respiratory failure  Brief History   59 yo male smoker presented to ER with worsening shortness of breath and chest discomfort.  Found to have severe hypertension with SBP > 200.  CT chest showed b/l PE and RLL ASD with mucus plugging concerning for aspiration pneumonia.  Past Medical History  ETOH, HTN, Pancreatitis, ACE inhibitor induced angioedema July 2021, Aneurysm  Significant Hospital Events   7/26 Admit, start heparin gtt, VDRF, bronchoscopy 7/27 Extubated >> required reintubation due to stridor; start decadron 7/29 Extubate  Consults:  Cardiology >> s/o 7/27  Procedures:  ETT 7/26 >> 7/27 ETT 7/27 >> 7/29  Significant Diagnostic Tests:   CT-PA 7/26 >> acute b/l segmental and subsegmental PE, asymmetric enlargement of Rt thyroid gland extending into substernal region, debris in RLL bronchus with pneumonitis, LLL ASD  Bronchoscopy 7/26 >> Therapeutic aspiration performed in RLL and RML which were fully occluded.There was thick secretions fully occluding the RLL and RML opening. They were tan and bloody. Freely suctioned clear with the bronchoscope.   Echo 7/26 >> EF 60 to 65%, mild LVH, PASP 35.3 mmHg  Micro Data:  SARSCoV2 7/25 >> negative Respiratory 7/25 >>   Antimicrobials:  Azithromycin 7/25 Ceftriaxone 7/25 Vancomycin 7/26 >>  Zosyn 7/26 >>  Interim history/subjective:  Pressure support.  Objective   Blood pressure 117/76, pulse 64, temperature 98.5 F (36.9 C), temperature source Oral, resp. rate (!) 25, weight 72.9 kg, SpO2 100 %.    Vent Mode: PSV;CPAP FiO2 (%):  [40 %] 40 % Set Rate:  [16 bmp] 16 bmp Vt Set:  [570 mL] 570 mL PEEP:  [5 cmH20] 5 cmH20 Pressure Support:  [5 cmH20-8 cmH20] 5 cmH20 Plateau Pressure:  [19 cmH20-22 cmH20] 22 cmH20   Intake/Output  Summary (Last 24 hours) at 11/03/2019 9417 Last data filed at 11/03/2019 4081 Gross per 24 hour  Intake 3322.58 ml  Output 2340 ml  Net 982.58 ml   Filed Weights   10/31/19 2200 11/02/19 0500 11/03/19 0327  Weight: 69.1 kg 72.9 kg 72.9 kg    Examination:  General - alert Eyes - pupils reactive ENT - ETT in place Cardiac - regular rate/rhythm, no murmur Chest - equal breath sounds b/l, no wheezing or rales Abdomen - soft, non tender, + bowel sounds Extremities - no cyanosis, clubbing, or edema Skin - no rashes Neuro - normal strength, moves extremities, follows commands   Resolved Hospital Problem list   Hypertensive emergency  Assessment & Plan:   Acute hypoxic respiratory failure. - from acute PE, aspiration pneumonia and mucus plugging - extubation trial again 7/29 - goal SpO2 > 92% - Bipap prn after extubation - scheduled duoneb  Post extubation stridor. - likely from recent episode of angioedema and intubations causing upper airway edema - re-intubated 7/27 - continue decadron through 7/30  Acute pulmonary embolism. - continue heparin gtt for now  Aspiration pneumonia with mucus plugging. - s/p bronchoscopy 7/26 - day 5/5 of ABx - f/u CXR intermittently  Hypertension. - coreg, norvasc  Goiter. - TSH 0.760 from 10/31/19 - if he continues to have difficult with extubation trials, then might need ENT assessment  Steroid induced hyperglycemia. - SSI   Best practice:  Diet: NPO DVT prophylaxis: heparin infusion GI prophylaxis: protonix Mobility: Bedrest Code Status: Full Family communication: updated pt's sister at bedside Disposition: ICU  Labs    CMP Latest Ref Rng & Units 11/03/2019 11/02/2019 11/01/2019  Glucose 70 - 99 mg/dL 99 409(B) -  BUN 8 - 23 mg/dL 17 18 -  Creatinine 3.53 - 1.24 mg/dL 2.99 2.42 -  Sodium 683 - 145 mmol/L 149(H) 146(H) 148(H)  Potassium 3.5 - 5.1 mmol/L 3.2(L) 3.5 3.0(L)  Chloride 98 - 111 mmol/L 115(H) 113(H) -  CO2  22 - 32 mmol/L 22 25 -  Calcium 8.9 - 10.3 mg/dL 4.1(D) 6.2(I) -  Total Protein 6.5 - 8.1 g/dL - - -  Total Bilirubin 0.3 - 1.2 mg/dL - - -  Alkaline Phos 38 - 126 U/L - - -  AST 15 - 41 U/L - - -  ALT 0 - 44 U/L - - -    CBC Latest Ref Rng & Units 11/03/2019 11/02/2019 11/01/2019  WBC 4.0 - 10.5 K/uL 10.1 10.7(H) -  Hemoglobin 13.0 - 17.0 g/dL 12.7(L) 13.2 14.6  Hematocrit 39 - 52 % 36.9(L) 37.2(L) 43.0  Platelets 150 - 400 K/uL 242 235 -    ABG    Component Value Date/Time   PHART 7.379 11/01/2019 1337   PCO2ART 50.2 (H) 11/01/2019 1337   PO2ART 309 (H) 11/01/2019 1337   HCO3 29.8 (H) 11/01/2019 1337   TCO2 31 11/01/2019 1337   O2SAT 100.0 11/01/2019 1337    CBG (last 3)  Recent Labs    11/02/19 2325 11/03/19 0321 11/03/19 0749  GLUCAP 135* 102* 174*    Critical care time: 34 minutes  Coralyn Helling, MD [place] Pulmonary/Critical Care Pager - [phone] 11/03/2019, 9:21 AM

## 2019-11-03 NOTE — Progress Notes (Signed)
Pharmacy Antibiotic Note  Marco Pittman is a 59 y.o. male admitted on 10/30/2019 with PE and pneumonia.  Pharmacy has been consulted for  Zosyn dosing.  Renal function remains stable. Today is day 4 of 5 for aspiration pneumonia. Stop date entered  Plan:  Continue Zosyn 3.375g IV every 8 hours (4-hour infusion) for 5 more doses.   Temp (24hrs), Avg:98.4 F (36.9 C), Min:97.9 F (36.6 C), Max:98.6 F (37 C)  Recent Labs  Lab 10/30/19 2124 10/30/19 2124 10/31/19 0104 10/31/19 0421 10/31/19 1308 11/01/19 0247 11/02/19 0127 11/03/19 0305  WBC 13.1*   < > 16.2*  --  15.5* 9.6 10.7* 10.1  CREATININE 1.02  --  0.94 0.92  --   --  1.01 1.00   < > = values in this interval not displayed.    Estimated Creatinine Clearance: 78.9 mL/min (by C-G formula based on SCr of 1 mg/dL).    Allergies  Allergen Reactions  . Lisinopril Swelling    Angioedema    Thank you for allowing pharmacy to be a part of this patient's care.  Jeanella Cara, PharmD, [facility] Clinical Pharmacist Please see AMION for all Pharmacists' Contact Phone Numbers 11/03/2019, 9:49 AM

## 2019-11-03 NOTE — Progress Notes (Signed)
Nutrition Follow-up  DOCUMENTATION CODES:   Non-severe (moderate) malnutrition in context of social or environmental circumstances  INTERVENTION:   - Continue MVI with minerals daily  - Magic cup TID with meals, each supplement provides 290 kcal and 9 grams of protein  - Double protein portions TID with meals  NUTRITION DIAGNOSIS:   Moderate Malnutrition related to social / environmental circumstances (ETOH abuse) as evidenced by mild fat depletion, moderate muscle depletion, severe muscle depletion.  Ongoing  GOAL:   Patient will meet greater than or equal to 90% of their needs  Progressing  MONITOR:   Vent status, TF tolerance, Labs  REASON FOR ASSESSMENT:   Ventilator, Consult Enteral/tube feeding initiation and management  ASSESSMENT:   59 yo male admitted with severe HTN, bilateral PE, RLL ASD with mucous plugging. PMH includes tobacco use, ETOH abuse, HTN, pancreatitis, angioedema r/t ACE inhibitor, aneurysm.  7/27 - extubated, reintubated 7/29 - extubated  Discussed pt with RN and during ICU rounds. Pt extubated this AM. SLP completed swallow evaluation with recommendation for regular diet with nectar-thick liquids due to acute post-extubation dysphagia.  Spoke with pt at bedside. He reports feeling like he could eat something. Pt drinking nectar-thick juice at time of RD visit. Pt willing to try Magic Cups with meals.  Discussed pt's PO intake PTA. Pt reports that he typically only ate once daily in the evenings (burger or salad). Pt reports at times he was "scared to eat" because he didn't know what to eat. Discussed importance of adequate PO intake in maintaining lean muscle mass and preventing lean body mass loss. Pt expresses understanding.  Medications reviewed and include: decadron, folic acid, SSI q 4 hours, MVI with minerals, protonix, miralax, KCl 20 mEq x 2 doses, thiamine, heparin, IV abx  Labs reviewed: sodium 149, potassium 3.2, phosphorus 2.3,  magnesium 2.5 CBG's: 69-184 x 24 hours  UOP: 2915 ml x 24 hours I/O's: +1.6 L since admit  Diet Order:   Diet Order            Diet regular Room service appropriate? Yes with Assist; Fluid consistency: Nectar Thick  Diet effective now                 EDUCATION NEEDS:   Not appropriate for education at this time  Skin:  Skin Assessment: Reviewed RN Assessment  Last BM:  11/02/19  Height:   Ht Readings from Last 1 Encounters:  10/24/19 5' 9.5" (1.765 m)    Weight:   Wt Readings from Last 1 Encounters:  11/03/19 72.9 kg    Ideal Body Weight:  74.1 kg  BMI:  Body mass index is 23.39 kg/m.  Estimated Nutritional Needs:   Kcal:  2200-2400  Protein:  110-130 grams  Fluid:  >/= 1.8 L    Earma Reading, MS, RD, LDN Inpatient Clinical Dietitian Please see AMiON for contact information.

## 2019-11-03 NOTE — Progress Notes (Signed)
ANTICOAGULATION CONSULT NOTE  Pharmacy Consult for heparin Indication: pulmonary embolus  Allergies  Allergen Reactions  . Lisinopril Swelling    Angioedema    Patient Measurements: Weight: 72.9 kg (160 lb 11.5 oz) Heparin Dosing Weight: 75 kg  Vital Signs: Temp: 98.5 F (36.9 C) (07/29 0847) Temp Source: Oral (07/29 0847) BP: 117/76 (07/29 0900) Pulse Rate: 64 (07/29 0900)  Labs: Recent Labs     0000 10/31/19 1308 10/31/19 1501 10/31/19 1736 11/01/19 0247 11/01/19 0247 11/01/19 1008 11/01/19 1337 11/01/19 1337 11/02/19 0127 11/03/19 0305  HGB  --  14.6  --    < > 11.9*   < >  --  14.6   < > 13.2 12.7*  HCT  --  43.9  --    < > 35.0*   < >  --  43.0  --  37.2* 36.9*  PLT   < > 243  --   --  218  --   --   --   --  235 242  HEPARINUNFRC  --   --   --    < > 0.27*   < > 0.39  --   --  0.61 0.55  CREATININE  --   --   --   --   --   --   --   --   --  1.01 1.00  TROPONINIHS  --  454* 516*  --   --   --   --   --   --   --   --    < > = values in this interval not displayed.    Estimated Creatinine Clearance: 78.9 mL/min (by C-G formula based on SCr of 1 mg/dL).  Assessment: 53 yom admitted with acute PE, no RHS. Pharmacy consulted to dose heparin. Heparin level is therapeutic; no bleeding reported.  Goal of Therapy:  Heparin level 0.3-0.7 units/ml Monitor platelets by anticoagulation protocol: Yes   Plan:  Continue heparin gtt at 1650 units/hr Daily heparin level and CBC F/u long-term anticoagulation plan  Jeanella Cara, PharmD, [facility] Clinical Pharmacist Please see AMION for all Pharmacists' Contact Phone Numbers 11/03/2019, 9:44 AM

## 2019-11-03 NOTE — Procedures (Signed)
Extubation Procedure Note  Patient Details:   Name: Marco Pittman DOB: 1960/10/05 MRN: [id number]   Airway Documentation:    Vent end date: 11/03/19 Vent end time: 0940   Evaluation  O2 sats: stable throughout Complications: No apparent complications Patient did tolerate procedure well. Bilateral Breath Sounds: Clear, Diminished   Yes  Patient was extubated to 2 LPM nasal cannula. Patient had a positive cuff leak prior to extubation as well as volume loss on the vent. Patient had a good strong productive cough once extubated. Able to speak name. No stridor was noted. Good air movement in neck and chest. BBS clear; diminished. RN at bedside. Patient stable. RT will continue to monitor.    Ileen Kahre M 11/03/2019, 9:44 AM

## 2019-11-04 LAB — BASIC METABOLIC PANEL
Anion gap: 10 (ref 5–15)
BUN: 15 mg/dL (ref 8–23)
CO2: 23 mmol/L (ref 22–32)
Calcium: 8.8 mg/dL — ABNORMAL LOW (ref 8.9–10.3)
Chloride: 114 mmol/L — ABNORMAL HIGH (ref 98–111)
Creatinine, Ser: 1 mg/dL (ref 0.61–1.24)
GFR calc Af Amer: >60 mL/min (ref >60)
GFR calc non Af Amer: >60 mL/min (ref >60)
Glucose, Bld: 132 mg/dL — ABNORMAL HIGH (ref 70–99)
Potassium: 3.7 mmol/L (ref 3.5–5.1)
Sodium: 147 mmol/L — ABNORMAL HIGH (ref 135–145)

## 2019-11-04 LAB — CBC
HCT: 37 % — ABNORMAL LOW (ref 39.0–52.0)
Hemoglobin: 12.9 g/dL — ABNORMAL LOW (ref 13.0–17.0)
MCH: 31.7 pg (ref 26.0–34.0)
MCHC: 34.9 g/dL (ref 30.0–36.0)
MCV: 90.9 fL (ref 80.0–100.0)
Platelets: 272 10*3/uL (ref 150–400)
RBC: 4.07 MIL/uL — ABNORMAL LOW (ref 4.22–5.81)
RDW: 13.4 % (ref 11.5–15.5)
WBC: 8.9 10*3/uL (ref 4.0–10.5)
nRBC: 0 % (ref 0.0–0.2)

## 2019-11-04 LAB — GLUCOSE, CAPILLARY
Glucose-Capillary: 122 mg/dL — ABNORMAL HIGH (ref 70–99)
Glucose-Capillary: 133 mg/dL — ABNORMAL HIGH (ref 70–99)
Glucose-Capillary: 117 mg/dL — ABNORMAL HIGH (ref 70–99)
Glucose-Capillary: 87 mg/dL (ref 70–99)
Glucose-Capillary: 114 mg/dL — ABNORMAL HIGH (ref 70–99)

## 2019-11-04 LAB — HEPARIN LEVEL (UNFRACTIONATED): Heparin Unfractionated: 0.69 IU/mL (ref 0.30–0.70)

## 2019-11-04 MED ORDER — DEXAMETHASONE SODIUM PHOSPHATE 4 MG/ML IJ SOLN
4.0000 mg | Freq: Two times a day (BID) | INTRAMUSCULAR | Status: DC
  Administered 2019-11-04 – 2019-11-05 (×2): 4 mg via INTRAVENOUS
  Filled 2019-11-04 (×2): qty 1

## 2019-11-04 MED ORDER — APIXABAN 5 MG PO TABS: 5.0000 mg | ORAL_TABLET | Freq: Two times a day (BID) | ORAL | Status: DC

## 2019-11-04 MED ORDER — APIXABAN 5 MG PO TABS
10.0000 mg | ORAL_TABLET | Freq: Two times a day (BID) | ORAL | Status: DC
  Administered 2019-11-04 – 2019-11-05 (×4): 10 mg via ORAL
  Filled 2019-11-04 (×5): qty 2

## 2019-11-04 MED ORDER — INSULIN ASPART 100 UNIT/ML ~~LOC~~ SOLN: 0.0000 [IU] | Freq: Every day | SUBCUTANEOUS | Status: DC

## 2019-11-04 MED ORDER — INSULIN ASPART 100 UNIT/ML ~~LOC~~ SOLN
0.0000 [IU] | Freq: Three times a day (TID) | SUBCUTANEOUS | Status: DC
  Administered 2019-11-05: 3 [IU] via SUBCUTANEOUS

## 2019-11-04 MED ORDER — IPRATROPIUM-ALBUTEROL 0.5-2.5 (3) MG/3ML IN SOLN: 3.0000 mL | RESPIRATORY_TRACT | Status: DC | PRN

## 2019-11-04 NOTE — Progress Notes (Addendum)
NAME:  Marco Pittman, MRN:  [id number], DOB:  1961/01/12, LOS: 5 ADMISSION DATE:  10/30/2019, CONSULTATION DATE: 10/31/2019 REFERRING MD: Dr. Toniann Fail, CHIEF COMPLAINT: Multifactorial respiratory failure  Brief History   59 yo male smoker presented to ER with worsening shortness of breath and chest discomfort.  Found to have severe hypertension with SBP > 200.  CT chest showed b/l PE and RLL ASD with mucus plugging concerning for aspiration pneumonia.  Past Medical History  ETOH, HTN, Pancreatitis, ACE inhibitor induced angioedema July 2021, Aneurysm  Significant Hospital Events   7/26 Admit, start heparin gtt, VDRF, bronchoscopy 7/27 Extubated >> required reintubation due to stridor; start decadron 7/29 Extubate 7/30 transfer to floor bed  Consults:  Cardiology >> s/o 7/27  Procedures:  ETT 7/26 >> 7/27 ETT 7/27 >> 7/29  Significant Diagnostic Tests:   CT-PA 7/26 >> acute b/l segmental and subsegmental PE, asymmetric enlargement of Rt thyroid gland extending into substernal region, debris in RLL bronchus with pneumonitis, LLL ASD  Bronchoscopy 7/26 >> Therapeutic aspiration performed in RLL and RML which were fully occluded.There was thick secretions fully occluding the RLL and RML opening. They were tan and bloody. Freely suctioned clear with the bronchoscope.   Echo 7/26 >> EF 60 to 65%, mild LVH, PASP 35.3 mmHg  Micro Data:  SARSCoV2 7/25 >> negative  Antimicrobials:  Azithromycin 7/25 Ceftriaxone 7/25 Vancomycin 7/26 >> 7/29 Zosyn 7/26 >>7/30  Interim history/subjective:  Feels better.  Denies chest pain, dyspnea.  Objective   Blood pressure (!) 130/79, pulse 62, temperature 97.8 F (36.6 C), temperature source Oral, resp. rate (!) 24, weight 72.9 kg, SpO2 100 %.        Intake/Output Summary (Last 24 hours) at 11/04/2019 1156 Last data filed at 11/04/2019 1100 Gross per 24 hour  Intake 1144.47 ml  Output 900 ml  Net 244.47 ml   Filed Weights    11/02/19 0500 11/03/19 0327 11/04/19 0500  Weight: 72.9 kg 72.9 kg 72.9 kg    Examination:  General - alert Eyes - pupils reactive ENT - no sinus tenderness, raspy voice, no stridor Cardiac - regular rate/rhythm, no murmur Chest - equal breath sounds b/l, no wheezing or rales Abdomen - soft, non tender, + bowel sounds Extremities - no cyanosis, clubbing, or edema Skin - no rashes Neuro - normal strength, moves extremities, follows commands Psych - normal mood and behavior   Resolved Hospital Problem list   Hypertensive emergency  Assessment & Plan:   Acute hypoxic respiratory failure. - from acute PE, aspiration pneumonia and mucus plugging - prn duoneb - goal SpO2 > 92%  Post extubation stridor. - likely from recent episode of angioedema and intubations causing upper airway edema - re-intubated 7/27 - successfully extubated 7/29 - change decadron to 4 mg bid on 7/30, and wean off over next 48 hrs  Acute pulmonary embolism. - will have pharmacy dose eliquis  Aspiration pneumonia with mucus plugging. - s/p bronchoscopy 7/26 - complete ABx on 7/30  Hypertension. - coreg, norvasc  Goiter. - TSH 0.760 from 10/31/19 - f/u as outpt  Steroid induced hyperglycemia. - SSI  Homeless.  - transition of care team consult   Best practice:  Diet: regular diet DVT prophylaxis: heparin infusion GI prophylaxis: Not indicated Mobility: as tolerated Code Status: Full Disposition: to floor bed 7/30 >> will ask [facility] to assume care from 7/31 and PCCM off.  Labs    CMP Latest Ref Rng & Units 11/04/2019 11/03/2019 11/02/2019  Glucose 70 -  99 mg/dL 356(P) 99 014(D)  BUN 8 - 23 mg/dL 15 17 18   Creatinine 0.61 - 1.24 mg/dL 0.30 1.31  Sodium 135 - 145 mmol/L 147(H) 149(H) 146(H)  Potassium 3.5 - 5.1 mmol/L 3.7 3.2(L) 3.5  Chloride 98 - 111 mmol/L 114(H) 115(H) 113(H)  CO2 22 - 32 mmol/L 23 22 25   Calcium 8.9 - 10.3 mg/dL 4.38) ) 8.8(L)  Total Protein 6.5 - 8.1  g/dL - - -  Total Bilirubin 0.3 - 1.2 mg/dL - - -  Alkaline Phos 38 - 126 U/L - - -  AST 15 - 41 U/L - - -  ALT 0 - 44 U/L - - -    CBC Latest Ref Rng & Units 11/04/2019 11/03/2019 11/02/2019  WBC 4.0 - 10.5 K/uL 8.9 10.1 10.7(H)  Hemoglobin 13.0 - 17.0 g/dL 12.9(L) 12.7(L) 13.2  Hematocrit 39 - 52 % 37.0(L) 36.9(L) 37.2(L)  Platelets 150 - 400 K/uL 272 242 235    ABG    Component Value Date/Time   PHART 7.379 11/01/2019 1337   PCO2ART 50.2 (H) 11/01/2019 1337   PO2ART 309 (H) 11/01/2019 1337   HCO3 29.8 (H) 11/01/2019 1337   TCO2 31 11/01/2019 1337   O2SAT 100.0 11/01/2019 1337    CBG (last 3)  Recent Labs    11/04/19 0339 11/04/19 0740 11/04/19 1121  GLUCAP 122* 133* 117*    Signature:  11/06/19, MD [facility] Pulmonary/Critical Care Pager - [phone] 11/04/2019, 11:56 AM

## 2019-11-04 NOTE — Progress Notes (Signed)
The patient was received from ICU at 1852 via wheelchair.  Alert and oriented.  Family members are present.  No distress noted.  Patient's voice sounds hoarse in tone.

## 2019-11-04 NOTE — Progress Notes (Signed)
  Speech Language Pathology Treatment: Dysphagia  Patient Details Name: Marco Pittman MRN: [id number] DOB: [birth year] Today's Date: 11/04/2019 Time: 1050-1100 SLP Time Calculation (min) (ACUTE ONLY): 10 min  Assessment / Plan / Recommendation Clinical Impression  Pt with improved swallowing today - observed with consumption of thin liquids.  He demonstrated no s/s of aspiration with three oz trials x2.  At end of session, he exhibited one potential incident of airway intrusion with strong cough elicited, but this was not repeated. Encouraged to take his time, eat/drink slowly. Pt's voice is still mildly hoarse, overall with improvements since yesterday. Allow thin liquids; d/c thickened liquids. No SLP f/u needed. D/W pt, RN.   HPI HPI: 59 yo male smoker presented to ER 7/26 with worsening shortness of breath and chest discomfort.  Found to have severe hypertension with SBP > 200.  CT chest showed b/l PE and RLL ASD with mucus plugging concerning for aspiration pneumonia. PMH: ETOH, HTN, Pancreatitis, Aneurysm. ETT 7/26-27; reintubated 7/27-29 due to angioedema.       SLP Plan  All goals met       Recommendations  Diet recommendations: Regular;Thin liquid Liquids provided via: Cup Medication Administration: Whole meds with puree Supervision: Patient able to self feed                Oral Care Recommendations: Oral care BID Follow up Recommendations: None SLP Visit Diagnosis: Dysphagia, pharyngeal phase (R13.13) Plan: All goals met       GO                Juan Quam Laurice 11/04/2019, 11:42 AM  Estill Bamberg L. Tivis Ringer, [place] Office number [phone] Pager [phone]

## 2019-11-04 NOTE — Evaluation (Signed)
Occupational Therapy Evaluation Patient Details Name: Marco Pittman MRN: [id number] DOB: September 19, 1960 Today's Date: 11/04/2019    History of Present Illness 59 yo male smoker presented to ER with worsening shortness of breath and chest discomfort.  Found to have severe hypertension with SBP > 200.  CT chest showed b/l PE and RLL ASD with mucus plugging concerning for aspiration pneumonia.   Clinical Impression   Patient evaluated by Occupational Therapy with no further acute OT needs identified. All education has been completed and the patient has no further questions. See below for any follow-up Occupational Therapy or equipment needs. OT to sign off. Thank you for referral.      Follow Up Recommendations  No OT follow up    Equipment Recommendations  None recommended by OT    Recommendations for Other Services       Precautions / Restrictions Precautions Precautions: None      Mobility Bed Mobility Overal bed mobility:  (NT OOB)             General bed mobility comments: oob in chair on arrival  Transfers Overall transfer level: Independent                    Balance Overall balance assessment: Modified Independent (able to simulate shower with eyes closed)                                         ADL either performed or assessed with clinical judgement   ADL Overall ADL's : Modified independent                                             Vision Baseline Vision/History: No visual deficits       Perception     Praxis      Pertinent Vitals/Pain Pain Assessment: No/denies pain Pain Score: 0-No pain Pain Intervention(s): Monitored during session     Hand Dominance Right   Extremity/Trunk Assessment Upper Extremity Assessment Upper Extremity Assessment: Overall WFL for tasks assessed   Lower Extremity Assessment Lower Extremity Assessment: Overall WFL for tasks assessed   Cervical / Trunk  Assessment Cervical / Trunk Assessment: Normal   Communication Communication Communication: No difficulties   Cognition Arousal/Alertness: Awake/alert Behavior During Therapy: WFL for tasks assessed/performed Overall Cognitive Status: Within Functional Limits for tasks assessed                                     General Comments       Exercises     Shoulder Instructions      Home Living Family/patient expects to be discharged to:: Private residence Living Arrangements: Other (Comment) (roommate and his mother) Available Help at Discharge: Family Type of Home: House Home Access: Stairs to enter Secretary/administrator of Steps: flight Entrance Stairs-Rails: Right;Left Home Layout: One level     Bathroom Shower/Tub: Chief Strategy Officer: Standard     Home Equipment: None   Additional Comments: works in Occupational psychologist houses at [employer]      Prior Functioning/Environment Level of Independence: Independent        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  OT Problem List:        OT Treatment/Interventions:      OT Goals(Current goals can be found in the care plan section) Acute Rehab OT Goals Patient Stated Goal: to go home  OT Frequency:     Barriers to D/C:            Co-evaluation              AM-PAC OT "6 Clicks" Daily Activity     Outcome Measure Help from another person eating meals?: None Help from another person taking care of personal grooming?: None Help from another person toileting, which includes using toliet, bedpan, or urinal?: None Help from another person bathing (including washing, rinsing, drying)?: None Help from another person to put on and taking off regular upper body clothing?: None Help from another person to put on and taking off regular lower body clothing?: None 6 Click Score: 24   End of Session Nurse Communication: Mobility status;Precautions  Activity Tolerance:  Patient tolerated treatment well Patient left: Other (comment) (starting PT balance assessment in hall)  OT Visit Diagnosis: Unsteadiness on feet (R26.81)                Time: 3875-6433 OT Time Calculation (min): 10 min Charges:  OT General Charges $OT Visit: 1 Visit OT Evaluation $OT Eval Low Complexity: 1 Low   Brynn, OTR/L  Acute Rehabilitation Services Pager: [phone] Office: [phone] .   Mateo Flow 11/04/2019, 3:59 PM

## 2019-11-04 NOTE — Evaluation (Signed)
Physical Therapy Evaluation Patient Details Name: Marco Pittman MRN: [id number] DOB: [birth year] Today's Date: 11/04/2019   History of Present Illness  59 yo male smoker presented to ER with worsening shortness of breath and chest discomfort.  Found to have severe hypertension with SBP > 200.  CT chest showed b/l PE and RLL ASD with mucus plugging concerning for aspiration pneumonia.  Clinical Impression  Pt admitted with/for worsening SOB and chest pain, found to be in part due to bil PE.  Pt at an independent level as of the evaluation..No further PT needs will sign off at this time.     Follow Up Recommendations No PT follow up    Equipment Recommendations  None recommended by PT    Recommendations for Other Services       Precautions / Restrictions Precautions Precautions: None      Mobility  Bed Mobility Overal bed mobility:  (NT OOB)                Transfers Overall transfer level: Independent                  Ambulation/Gait Ambulation/Gait assistance: Independent Gait Distance (Feet): 250 Feet Assistive device: None Gait Pattern/deviations: Step-through pattern Gait velocity: WFL Gait velocity interpretation: >2.62 ft/sec, indicative of community ambulatory General Gait Details: steady and safe with age appropriate speeds in a home like environment  Stairs Stairs: Yes Stairs assistance: Independent Stair Management: No rails;Alternating pattern;Forwards Number of Stairs: 6    Wheelchair Mobility    Modified Rankin (Stroke Patients Only)       Balance Overall balance assessment: Modified Independent                                           Pertinent Vitals/Pain Pain Assessment: No/denies pain Pain Score: 0-No pain Pain Intervention(s): Monitored during session    Home Living Family/patient expects to be discharged to:: Private residence Living Arrangements: Other (Comment) (room mate) Available Help at  Discharge: Family Type of Home: Apartment Home Access: Stairs to enter Entrance Stairs-Rails: Doctor, general practice of Steps: flight Home Layout: One level Home Equipment: None      Prior Function Level of Independence: Independent         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     Hand Dominance   Dominant Hand: Right    Extremity/Trunk Assessment   Upper Extremity Assessment Upper Extremity Assessment: Overall WFL for tasks assessed    Lower Extremity Assessment Lower Extremity Assessment: Overall WFL for tasks assessed    Cervical / Trunk Assessment Cervical / Trunk Assessment: Normal  Communication   Communication: No difficulties  Cognition Arousal/Alertness: Awake/alert Behavior During Therapy: WFL for tasks assessed/performed Overall Cognitive Status: Within Functional Limits for tasks assessed                                        General Comments      Exercises     Assessment/Plan    PT Assessment Patent does not need any further PT services  PT Problem List         PT Treatment Interventions      PT Goals (Current goals can be found in the Care Plan section)  Acute Rehab PT Goals PT Goal Formulation: All  assessment and education complete, DC therapy    Frequency     Barriers to discharge        Co-evaluation               AM-PAC PT "6 Clicks" Mobility  Outcome Measure Help needed turning from your back to your side while in a flat bed without using bedrails?: None Help needed moving from lying on your back to sitting on the side of a flat bed without using bedrails?: None Help needed moving to and from a bed to a chair (including a wheelchair)?: None Help needed standing up from a chair using your arms (e.g., wheelchair or bedside chair)?: None Help needed to walk in hospital room?: None Help needed climbing 3-5 steps with a railing? : None 6 Click Score: 24    End of Session   Activity  Tolerance: Patient tolerated treatment well Patient left: in chair;with call bell/phone within reach Nurse Communication: Mobility status PT Visit Diagnosis: Other abnormalities of gait and mobility (R26.89)    Time: 3570-1779 PT Time Calculation (min) (ACUTE ONLY): 10 min   Charges:   PT Evaluation $PT Eval Low Complexity: 1 Low          11/04/2019  Jacinto Halim., PT Acute Rehabilitation Services [phone]  (pager) [phone]  (office)  Eliseo Gum Arsenia Goracke 11/04/2019, 2:02 PM

## 2019-11-04 NOTE — Progress Notes (Addendum)
ANTICOAGULATION CONSULT NOTE  Pharmacy Consult for heparin and apixaban Indication: pulmonary embolus  Allergies  Allergen Reactions  . Lisinopril Swelling    Angioedema    Patient Measurements: Weight: 72.9 kg (160 lb 11.5 oz) Heparin Dosing Weight: 75 kg  Vital Signs: Temp: 98.2 F (36.8 C) (07/30 0732) Temp Source: Oral (07/30 0732) BP: 144/87 (07/30 0909) Pulse Rate: 58 (07/30 0800)  Labs: Recent Labs    11/02/19 0127 11/02/19 0127 11/03/19 0305 11/04/19 0206  HGB 13.2   < > 12.7* 12.9*  HCT 37.2*  --  36.9* 37.0*  PLT 235  --  242 272  HEPARINUNFRC 0.61  --  0.55 0.69  CREATININE 1.01  --  1.00 1.00   < > = values in this interval not displayed.    Estimated Creatinine Clearance: 78.9 mL/min (by C-G formula based on SCr of 1 mg/dL).  Assessment: 34 yom admitted with acute PE, no RHS. Pharmacy consulted to dose heparin. Heparin level is therapeutic; no bleeding reported.  Goal of Therapy:  Heparin level 0.3-0.7 units/ml Monitor platelets by anticoagulation protocol: Yes   Plan:  Continue heparin gtt at 1650 units/hr Daily heparin level and CBC F/u long-term anticoagulation plan  Addendum: Pharmacy was consulted to transition from heparin to apixaban. Patient has been therapeutic on a heparin drip for 4 days.  Start apixaban 10 mg po bid x 3 more days, then 5 mg po bid Stop heparin drip when first dose of apixaban is administered.   Jeanella Cara, PharmD, [facility] Clinical Pharmacist Please see AMION for all Pharmacists' Contact Phone Numbers 11/04/2019, 9:16 AM

## 2019-11-04 NOTE — Plan of Care (Signed)
°  Problem: Education: Goal: Knowledge of General Education information will improve Description: Including pain rating scale, medication(s)/side effects and non-pharmacologic comfort measures 11/04/2019 2220 by Maxwell Marion, RN Outcome: Progressing 11/04/2019 2220 by Maxwell Marion, RN Outcome: Progressing   Problem: Education: Goal: Knowledge of General Education information will improve Description: Including pain rating scale, medication(s)/side effects and non-pharmacologic comfort measures 11/04/2019 2220 by Maxwell Marion, RN Outcome: Progressing 11/04/2019 2220 by Maxwell Marion, RN Outcome: Progressing   Problem: Education: Goal: Knowledge of General Education information will improve Description: Including pain rating scale, medication(s)/side effects and non-pharmacologic comfort measures 11/04/2019 2220 by Maxwell Marion, RN Outcome: Progressing 11/04/2019 2220 by Maxwell Marion, RN Outcome: Progressing   Problem: Education: Goal: Knowledge of General Education information will improve Description: Including pain rating scale, medication(s)/side effects and non-pharmacologic comfort measures 11/04/2019 2220 by Maxwell Marion, RN Outcome: Progressing 11/04/2019 2220 by Maxwell Marion, RN Outcome: Progressing

## 2019-11-04 NOTE — Plan of Care (Signed)

## 2019-11-04 NOTE — Discharge Instructions (Signed)
Information on my medicine - ELIQUIS (apixaban)  Why was Eliquis prescribed for you? Eliquis was prescribed to treat blood clots that may have been found in the veins of your legs (deep vein thrombosis) or in your lungs (pulmonary embolism) and to reduce the risk of them occurring again.  What do You need to know about Eliquis ? The starting dose is 10 mg (two 5 mg tablets) taken TWICE daily for the FIRST Three (3) DAYS, then on 8/2  the dose is reduced to ONE 5 mg tablet taken TWICE daily.  Eliquis may be taken with or without food.   Try to take the dose about the same time in the morning and in the evening. If you have difficulty swallowing the tablet whole please discuss with your pharmacist how to take the medication safely.  Take Eliquis exactly as prescribed and DO NOT stop taking Eliquis without talking to the doctor who prescribed the medication.  Stopping may increase your risk of developing a new blood clot.  Refill your prescription before you run out.  After discharge, you should have regular check-up appointments with your healthcare provider that is prescribing your Eliquis.    What do you do if you miss a dose? If a dose of ELIQUIS is not taken at the scheduled time, take it as soon as possible on the same day and twice-daily administration should be resumed. The dose should not be doubled to make up for a missed dose.  Important Safety Information A possible side effect of Eliquis is bleeding. You should call your healthcare provider right away if you experience any of the following: ? Bleeding from an injury or your nose that does not stop. ? Unusual colored urine (red or dark brown) or unusual colored stools (red or black). ? Unusual bruising for unknown reasons. ? A serious fall or if you hit your head (even if there is no bleeding).  Some medicines may interact with Eliquis and might increase your risk of bleeding or clotting while on Eliquis. To help avoid  this, consult your healthcare provider or pharmacist prior to using any new prescription or non-prescription medications, including herbals, vitamins, non-steroidal anti-inflammatory drugs (NSAIDs) and supplements.  This website has more information on Eliquis (apixaban): http://www.eliquis.com/eliquis/home

## 2019-11-05 ENCOUNTER — Inpatient Hospital Stay (HOSPITAL_COMMUNITY): Payer: Medicaid Other

## 2019-11-05 DIAGNOSIS — I2699 Other pulmonary embolism without acute cor pulmonale: Secondary | ICD-10-CM | POA: Diagnosis not present

## 2019-11-05 LAB — CBC
HCT: 37.9 % — ABNORMAL LOW (ref 39.0–52.0)
Hemoglobin: 12.9 g/dL — ABNORMAL LOW (ref 13.0–17.0)
MCH: 31.2 pg (ref 26.0–34.0)
MCHC: 34 g/dL (ref 30.0–36.0)
MCV: 91.5 fL (ref 80.0–100.0)
Platelets: 233 10*3/uL (ref 150–400)
RBC: 4.14 MIL/uL — ABNORMAL LOW (ref 4.22–5.81)
RDW: 13.2 % (ref 11.5–15.5)
WBC: 8.3 10*3/uL (ref 4.0–10.5)
nRBC: 0 % (ref 0.0–0.2)

## 2019-11-05 LAB — BASIC METABOLIC PANEL
Anion gap: 9 (ref 5–15)
BUN: 17 mg/dL (ref 8–23)
CO2: 21 mmol/L — ABNORMAL LOW (ref 22–32)
Calcium: 8.6 mg/dL — ABNORMAL LOW (ref 8.9–10.3)
Chloride: 109 mmol/L (ref 98–111)
Creatinine, Ser: 0.93 mg/dL (ref 0.61–1.24)
GFR calc Af Amer: >60 mL/min (ref >60)
GFR calc non Af Amer: >60 mL/min (ref >60)
Glucose, Bld: 127 mg/dL — ABNORMAL HIGH (ref 70–99)
Potassium: 4.1 mmol/L (ref 3.5–5.1)
Sodium: 139 mmol/L (ref 135–145)

## 2019-11-05 LAB — GLUCOSE, CAPILLARY
Glucose-Capillary: 99 mg/dL (ref 70–99)
Glucose-Capillary: 139 mg/dL — ABNORMAL HIGH (ref 70–99)

## 2019-11-05 MED ORDER — APIXABAN 5 MG PO TABS: ORAL_TABLET | ORAL | 3 refills | Status: DC

## 2019-11-05 MED ORDER — THIAMINE HCL 100 MG PO TABS: 100.0000 mg | ORAL_TABLET | Freq: Every day | ORAL | Status: DC

## 2019-11-05 MED ORDER — ADULT MULTIVITAMIN W/MINERALS CH: 1.0000 | ORAL_TABLET | Freq: Every day | ORAL | Status: DC

## 2019-11-05 NOTE — Progress Notes (Signed)
Pt dressed, IV removed, AVS reviewed, and belongings gathered. All questions answered to satisfaction. Pt given evening dose of coreg and eliquis, as verified by main pharmacy, before he left since his pharmacy was closed for the day. Pt wheeled downstairs to family car.

## 2019-11-05 NOTE — Plan of Care (Signed)

## 2019-11-05 NOTE — Discharge Summary (Signed)
Physician Discharge Summary  Patient ID: Marco Pittman MRN: [id number] DOB/AGE: 08/12/1960 59 y.o.  Admit date: 10/30/2019 Discharge date: 11/05/2019    Discharge Diagnoses:  Acute Hypoxic Respiratory Failure in setting of Acute Bilateral PE, Aspiration PNA Post-Extubation Stridor Hypertensive Urgency  HTN Goiter  Steroid Induced Hyperglycemia  Homeless                                                                     DISCHARGE PLAN BY DIAGNOSIS      Acute Hypoxic Respiratory Failure in setting of Acute Bilateral PE, Aspiration PNA Post-Extubation Stridor  Discharge Plan: -Resolved respiratory failure -continue Eliquis 10 mg BID through 8/2, then decrease to 56m BID  -likely will need 6 months anticoagulation  [facility]   Hypertensive Urgency  HTN  Discharge Plan: -continue coreg, norvasc  -follow up with Dr. CHarrell Gave -consider follow up ECHO in 6 months   Goiter   Discharge Plan: -TSH 0.760 from 10/31/19 -follow up TSH as outpatient   Steroid Induced Hyperglycemia   Discharge Plan: Resolved, no acute follow up    Homeless                     [facility]                     DIS[facility]GE SUMMARY   59year old male, smoker, with PMH of ETOH use, pancreatitis, HTN, aneurysm and recent admit from 7/15-7/21 for ACE-I induced angioedema who [facility] with reports of shortness of breath and chest discomfort.    The patient was found to be severely hypertensive with a systolic greater than 2427  Covid screening was negative.  CT of the chest demonstrated bilateral pulmonary embolism and right lower lobe airspace disease with mucous plugging concerning for aspiration pneumonia.  The patient was started on IV heparin for PE.  He was treated with IV antibiotics which were completed on 7/30.  Echo demonstrated LVEF 60 to 65%, mild LVH and PASP of 35.3.  He required  intubation for respiratory failure.  Bronchoscopy was performed with therapeutic aspiration performed in the right lower lobe and right middle lobe which were fully occluded with thick secretions.  He was subsequently extubated on 7/27 but required reintubation due to stridor.  He was started on Decadron in the setting of stridor.  He ultimately was extubated on 7/29 with transfer to the medical floor on 7/30.  LE doppler was positive for He was medically cleared for discharge on 7/31 with plans as above.          SIGNIFICANT DIAGNOSTIC STUDIES  CT-PA 7/26 >> acute b/l segmental and subsegmental PE, asymmetric enlargement of Rt thyroid gland extending into substernal region, debris in RLL bronchus with pneumonitis, LLL ASD  Bronchoscopy 7/26 >> Therapeutic aspiration performed inRLL and RML which were fully occluded.There was thick secretions fully occluding the RLL and RML opening. They were tan and bloody. Freely suctioned clear with the bronchoscope.  Echo 7/26 >> EF 60 to 65%, mild LVH, PASP 35.3 mmHgLE Venous Duplex 7/31 >> acute DVT bilaterally - Right peroneal, L peroneal   MICRO DATA  COVID 7/25 >>  ANTIBIOTICS Azithromycin 7/25 Ceftriaxone 7/25 Vancomycin 7/26 >> 7/29 Zosyn 7/26 >>7/30  CONSULTS Cardiology >> s/o 7/27  TUBES / LINES ETT 7/26 >> 7/27 ETT 7/27 >> 7/29   Discharge Exam: General: pleasant adult male lying in bed in NAD Neuro:AAOx4, speech clear CV: s1s2 rrr PULM: non-labored, lungs bilaterally clear  JI:RCVE/LFY-BOFBPZ, bsx4 active  Extremities: warm/dry, no edema   Vitals:   11/05/19 0015 11/05/19 0356 11/05/19 0744 11/05/19 1507  BP: (!) 133/80 (!) 148/100 (!) 148/100 112/71  Pulse: 61 58 58 72  Resp: _0 Temp: 98.7 F (37.1 C) 98.4 F (36.9 C) 98.4 F (36.9 C) 98.6 F (37 C)  TempSrc: Oral Oral Oral Oral  SpO2: 96% 98%  97%  Weight:   72.9 kg   Height:   5' 9.5" (1.765 m)      Discharge Labs  BMET Recent Labs  Lab  10/31/19 0421 10/31/19 1736 11/01/19 1239 11/01/19 1337 11/01/19 1337 11/01/19 1634 11/02/19 0127 11/02/19 0127 11/02/19 1636 11/03/19 0305 11/03/19 0305 11/04/19 0206 11/05/19 0316  NA 141   < >  --  148*  --   --  146*  --   --  149*  --  147* 139  K 3.5   < >  --  3.0*   < >  --  3.5   < >  --  3.2*   < > 3.7 4.1  CL 104  --   --   --   --   --  113*  --   --  115*  --  114* 109  CO2 25  --   --   --   --   --  25  --   --  22  --  23 21*  GLUCOSE 190*  --   --   --   --   --  162*  --   --  99  --  132* 127*  BUN 19  --   --   --   --   --  18  --   --  17  --  15 17  CREATININE 0.92  --   --   --   --   --  1.01  --   --  1.00  --  1.00 0.93  CALCIUM 8.6*  --   --   --   --   --  8.8*  --   --  8.6*  --  8.8* 8.6*  MG 2.4  --  2.4  --   --  2.3 2.4  --  2.5*  --   --   --   --   PHOS  --   --  1.6*  --   --  2.4* 2.3*  --  2.3*  --   --   --   --    < > = values in this interval not displayed.    CBC Recent Labs  Lab 11/03/19 0305 11/04/19 0206 11/05/19 0316  HGB 12.7* 12.9* 12.9*  HCT 36.9* 37.0* 37.9*  WBC 10.1 8.9 8.3  PLT 242 272 233    Anti-Coagulation No results for input(s): INR in the last 168 hours.  Discharge Instructions    Call MD for:  difficulty breathing, headache or visual disturbances   Complete by: As directed    Call MD for:  extreme fatigue   Complete by: As directed    Call MD for:  hives  Complete by: As directed    Call MD for:  persistant dizziness or light-headedness   Complete by: As directed    Call MD for:  persistant nausea and vomiting   Complete by: As directed    Call MD for:  redness, tenderness, or signs of infection (pain, swelling, redness, odor or green/yellow discharge around incision site)   Complete by: As directed    Call MD for:  severe uncontrolled pain   Complete by: As directed    Call MD for:  temperature >100.4   Complete by: As directed    Diet - low sodium heart healthy   Complete by: As directed     Discharge instructions   Complete by: As directed    1.  Review your medications carefully as they have changed 2.  You will be taking a blood thinner called Eliquis.  Please be careful with activity that may cause bleeding (shaving, brushing your teeth, yard work etc) as it may be harder to stop bleeding. If you have bleeding that can not be controlled with pressure, report to the ER immediately.  Watch for black or bloody stools, report this to your MD if you see changes.  3. Follow up with your primary MD as arranged 4. Report to the ER immediately if you have new or worsening symptoms.   Increase activity slowly   Complete by: As directed         Follow-up Information    Buford Dresser, MD Follow up.   Specialty: Cardiology Why: As needed Contact information: [address] [place] [address] [phone]        Ladell Pier, MD. Schedule an appointment as soon as possible for a visit on 11/14/2019.   Specialty: Internal Medicine Why: Keep scheduled appt  Contact information: [place] [address] [phone]                Allergies as of 11/05/2019      Reactions   Lisinopril Swelling   Angioedema      Medication List    STOP taking these medications   predniSONE 10 MG tablet Commonly known as: DELTASONE     TAKE these medications   amLODipine 10 MG tablet Commonly known as: NORVASC Take 1 tablet (10 mg total) by mouth daily.   apixaban 5 MG Tabs tablet Commonly known as: ELIQUIS Take 2 tablets BID through 8/2, then 1 tablet BID   carvedilol 3.125 MG tablet Commonly known as: COREG Take 1 tablet (3.125 mg total) by mouth 2 (two) times daily with a meal.   multivitamin with minerals Tabs tablet Take 1 tablet by mouth daily. Start taking on: November 06, 2019   thiamine 100 MG tablet Take 1 tablet (100 mg total) by mouth daily. Start taking on: November 06, 2019   ursodiol 500 MG tablet Commonly known  as: ACTIGALL TAKE 1 TABLET BY MOUTH 3 (THREE) TIMES DAILY. What changed:   how much to take  how to take this  when to take this  additional instructions         Disposition: Home. No new home health needs identified at discharge.   Discharged Condition: Marco Pittman has met maximum benefit of inpatient care and is medically stable and cleared for discharge.  Patient is pending follow up as above.      Time spent on disposition:  Greater than 35 Minutes.   Signed: Noe Gens, NP-C [facility] Pgr: [phone] Office: [phone]

## 2019-11-05 NOTE — Progress Notes (Signed)
VASCULAR LAB    Bilateral lower extremity venous duplex completed.    Preliminary report:  See CV proc for preliminary results.  Macenzie Burford, RVT 11/05/2019, 12:36 PM

## 2019-11-07 ENCOUNTER — Telehealth: Payer: Self-pay

## 2019-11-07 ENCOUNTER — Other Ambulatory Visit: Payer: Self-pay | Admitting: Internal Medicine

## 2019-11-07 DIAGNOSIS — I10 Essential (primary) hypertension: Secondary | ICD-10-CM

## 2019-11-07 MED FILL — ELIQUIS 5 MG TABLET: 5 | 27 days supply | Qty: 60 | Fill #0

## 2019-11-07 NOTE — Telephone Encounter (Signed)
Transition Care Management Follow-up Telephone Call Date of discharge and from where: 11/05/2019, [facility]  Call placed to patient # [phone],which is also the same number for [facility].  Voicemail full, unable to leave a message.  Patient has appt with Dr Laural Benes 11/14/2019

## 2019-11-08 ENCOUNTER — Telehealth: Payer: Self-pay | Admitting: *Deleted

## 2019-11-08 NOTE — Telephone Encounter (Signed)
Attempted to contact pt to complete transition of care assessment; name on voicemail is not the pt; message states voicemail is full; unable to leave message.  Burnard Bunting, RN, BSN, CCRN Patient Engagement Center [phone]

## 2019-11-08 NOTE — Telephone Encounter (Addendum)
Contacted pt called back; he states the name listed on voicemail is his boss;  transition of care assessment completed: Transition Care Management Follow-up Telephone Call  . Medicaid Managed Care Transition Call Status:MM [facility] Call Made  . Date of discharge and from where: [facility], 11/05/19  . How have you been since you were released from the hospital? " a whole lot better"  . Any questions or concerns? No  Items Reviewed: Marland Kitchen Did the pt receive and understand the discharge instructions provided? Yes  . Medications obtained and verified? Yes  . Any new allergies since your discharge? No  . Dietary orders reviewed? yes . Do you have support at home?  Functional Questionnaire: (I = Independent and D = Dependent)  ADLs: Independent Bathing/Dressing:Independent Meal Prep: Independent Eating: Independent Maintaining continence: Independent Transferring/Ambulation: Independent Managing Meds: Independent Follow up appointments reviewed:  PCP Hospital f/u appt confirmed? Yes  Scheduled to see Jonah Blue on 11/14/19 @ [facility] Hospital f/u appt confirmed? Pt to call Dr Jodelle Red, Cardiology on 11/08/19 Are transportation arrangements needed? No   If their condition worsens, is the pt aware to call PCP or go to the EmergencyDept.? yes  Was the patient provided with contact information for the PCP's office or ED? yes  Was to pt encouraged to call back with questions or concerns? yes  Burnard Bunting, RN, BSN, CCRN Patient Engagement Center [phone]

## 2019-11-09 ENCOUNTER — Encounter (HOSPITAL_COMMUNITY)
Admission: EM | Disposition: A | Discharge status: Home Health | Payer: Self-pay | Source: Home / Self Care | Attending: Internal Medicine

## 2019-11-09 ENCOUNTER — Encounter (HOSPITAL_COMMUNITY): Payer: Self-pay | Admitting: Emergency Medicine

## 2019-11-09 ENCOUNTER — Emergency Department (HOSPITAL_COMMUNITY): Payer: Medicaid Other

## 2019-11-09 ENCOUNTER — Emergency Department (HOSPITAL_COMMUNITY): Payer: Medicaid Other | Admitting: Certified Registered"

## 2019-11-09 ENCOUNTER — Other Ambulatory Visit: Payer: Self-pay

## 2019-11-09 ENCOUNTER — Inpatient Hospital Stay (HOSPITAL_COMMUNITY)
Admission: EM | Admit: 2019-11-09 | Discharge: 2019-12-10 | DRG: 011 | Disposition: A | Discharge status: Home Health | Payer: Medicaid Other | Attending: Family Medicine | Admitting: Family Medicine

## 2019-11-09 DIAGNOSIS — F1011 Alcohol abuse, in remission: Secondary | ICD-10-CM | POA: Diagnosis present

## 2019-11-09 DIAGNOSIS — I2782 Chronic pulmonary embolism: Secondary | ICD-10-CM | POA: Diagnosis not present

## 2019-11-09 DIAGNOSIS — K317 Polyp of stomach and duodenum: Secondary | ICD-10-CM | POA: Diagnosis not present

## 2019-11-09 DIAGNOSIS — J9809 Other diseases of bronchus, not elsewhere classified: Secondary | ICD-10-CM | POA: Diagnosis not present

## 2019-11-09 DIAGNOSIS — R1310 Dysphagia, unspecified: Secondary | ICD-10-CM

## 2019-11-09 DIAGNOSIS — Z7901 Long term (current) use of anticoagulants: Secondary | ICD-10-CM

## 2019-11-09 DIAGNOSIS — I700 Atherosclerosis of aorta: Secondary | ICD-10-CM | POA: Diagnosis not present

## 2019-11-09 DIAGNOSIS — K8689 Other specified diseases of pancreas: Secondary | ICD-10-CM | POA: Diagnosis not present

## 2019-11-09 DIAGNOSIS — R29818 Other symptoms and signs involving the nervous system: Secondary | ICD-10-CM | POA: Diagnosis not present

## 2019-11-09 DIAGNOSIS — R061 Stridor: Secondary | ICD-10-CM | POA: Diagnosis not present

## 2019-11-09 DIAGNOSIS — Z9889 Other specified postprocedural states: Secondary | ICD-10-CM | POA: Diagnosis not present

## 2019-11-09 DIAGNOSIS — F17290 Nicotine dependence, other tobacco product, uncomplicated: Secondary | ICD-10-CM | POA: Diagnosis present

## 2019-11-09 DIAGNOSIS — K6389 Other specified diseases of intestine: Secondary | ICD-10-CM | POA: Diagnosis not present

## 2019-11-09 DIAGNOSIS — Z8701 Personal history of pneumonia (recurrent): Secondary | ICD-10-CM | POA: Diagnosis not present

## 2019-11-09 DIAGNOSIS — I160 Hypertensive urgency: Secondary | ICD-10-CM | POA: Diagnosis not present

## 2019-11-09 DIAGNOSIS — Z8679 Personal history of other diseases of the circulatory system: Secondary | ICD-10-CM

## 2019-11-09 DIAGNOSIS — J9601 Acute respiratory failure with hypoxia: Secondary | ICD-10-CM | POA: Diagnosis present

## 2019-11-09 DIAGNOSIS — Z590 Homelessness: Secondary | ICD-10-CM | POA: Diagnosis not present

## 2019-11-09 DIAGNOSIS — K743 Primary biliary cirrhosis: Secondary | ICD-10-CM

## 2019-11-09 DIAGNOSIS — I10 Essential (primary) hypertension: Secondary | ICD-10-CM | POA: Diagnosis not present

## 2019-11-09 DIAGNOSIS — D638 Anemia in other chronic diseases classified elsewhere: Secondary | ICD-10-CM | POA: Diagnosis present

## 2019-11-09 DIAGNOSIS — J9611 Chronic respiratory failure with hypoxia: Secondary | ICD-10-CM | POA: Diagnosis not present

## 2019-11-09 DIAGNOSIS — Z930 Tracheostomy status: Secondary | ICD-10-CM | POA: Diagnosis not present

## 2019-11-09 DIAGNOSIS — H7093 Unspecified mastoiditis, bilateral: Secondary | ICD-10-CM | POA: Diagnosis not present

## 2019-11-09 DIAGNOSIS — E278 Other specified disorders of adrenal gland: Secondary | ICD-10-CM | POA: Diagnosis not present

## 2019-11-09 DIAGNOSIS — Z20822 Contact with and (suspected) exposure to covid-19: Secondary | ICD-10-CM | POA: Diagnosis present

## 2019-11-09 DIAGNOSIS — Z86711 Personal history of pulmonary embolism: Secondary | ICD-10-CM

## 2019-11-09 DIAGNOSIS — Z809 Family history of malignant neoplasm, unspecified: Secondary | ICD-10-CM | POA: Diagnosis not present

## 2019-11-09 DIAGNOSIS — J386 Stenosis of larynx: Secondary | ICD-10-CM | POA: Diagnosis not present

## 2019-11-09 DIAGNOSIS — K861 Other chronic pancreatitis: Secondary | ICD-10-CM | POA: Diagnosis not present

## 2019-11-09 DIAGNOSIS — Z430 Encounter for attention to tracheostomy: Secondary | ICD-10-CM | POA: Diagnosis not present

## 2019-11-09 DIAGNOSIS — R0602 Shortness of breath: Secondary | ICD-10-CM | POA: Diagnosis not present

## 2019-11-09 DIAGNOSIS — R1312 Dysphagia, oropharyngeal phase: Secondary | ICD-10-CM | POA: Diagnosis present

## 2019-11-09 DIAGNOSIS — F1021 Alcohol dependence, in remission: Secondary | ICD-10-CM | POA: Diagnosis present

## 2019-11-09 DIAGNOSIS — R0603 Acute respiratory distress: Secondary | ICD-10-CM | POA: Diagnosis not present

## 2019-11-09 DIAGNOSIS — J387 Other diseases of larynx: Secondary | ICD-10-CM | POA: Diagnosis not present

## 2019-11-09 DIAGNOSIS — Z79899 Other long term (current) drug therapy: Secondary | ICD-10-CM | POA: Diagnosis not present

## 2019-11-09 DIAGNOSIS — G319 Degenerative disease of nervous system, unspecified: Secondary | ICD-10-CM | POA: Diagnosis not present

## 2019-11-09 HISTORY — PX: TRACHEOSTOMY TUBE PLACEMENT: SHX814

## 2019-11-09 HISTORY — PX: MICROLARYNGOSCOPY: SHX5208

## 2019-11-09 LAB — TROPONIN I (HIGH SENSITIVITY)
Troponin I (High Sensitivity): 16 ng/L (ref <18)
Troponin I (High Sensitivity): 18 ng/L — ABNORMAL HIGH (ref <18)

## 2019-11-09 LAB — I-STAT CHEM 8, ED
BUN: 8 mg/dL (ref 8–23)
Calcium, Ion: 1.14 mmol/L — ABNORMAL LOW (ref 1.15–1.40)
Chloride: 101 mmol/L (ref 98–111)
Creatinine, Ser: 0.7 mg/dL (ref 0.61–1.24)
Glucose, Bld: 108 mg/dL — ABNORMAL HIGH (ref 70–99)
HCT: 44 % (ref 39.0–52.0)
Hemoglobin: 15 g/dL (ref 13.0–17.0)
Potassium: 3.9 mmol/L (ref 3.5–5.1)
Sodium: 142 mmol/L (ref 135–145)
TCO2: 30 mmol/L (ref 22–32)

## 2019-11-09 LAB — COMPREHENSIVE METABOLIC PANEL
ALT: 30 U/L (ref 0–44)
AST: 20 U/L (ref 15–41)
Albumin: 3.2 g/dL — ABNORMAL LOW (ref 3.5–5.0)
Alkaline Phosphatase: 70 U/L (ref 38–126)
Anion gap: 10 (ref 5–15)
BUN: 6 mg/dL — ABNORMAL LOW (ref 8–23)
CO2: 29 mmol/L (ref 22–32)
Calcium: 9.1 mg/dL (ref 8.9–10.3)
Chloride: 102 mmol/L (ref 98–111)
Creatinine, Ser: 0.71 mg/dL (ref 0.61–1.24)
GFR calc Af Amer: >60 mL/min (ref >60)
GFR calc non Af Amer: >60 mL/min (ref >60)
Glucose, Bld: 104 mg/dL — ABNORMAL HIGH (ref 70–99)
Potassium: 3.8 mmol/L (ref 3.5–5.1)
Sodium: 141 mmol/L (ref 135–145)
Total Bilirubin: 0.3 mg/dL (ref 0.3–1.2)
Total Protein: 7.2 g/dL (ref 6.5–8.1)

## 2019-11-09 LAB — CBC WITH DIFFERENTIAL/PLATELET
Abs Immature Granulocytes: 0.01 10*3/uL (ref 0.00–0.07)
Basophils Absolute: 0 10*3/uL (ref 0.0–0.1)
Basophils Relative: 0 %
Eosinophils Absolute: 0.1 10*3/uL (ref 0.0–0.5)
Eosinophils Relative: 2 %
HCT: 40.8 % (ref 39.0–52.0)
Hemoglobin: 14 g/dL (ref 13.0–17.0)
Immature Granulocytes: 0 %
Lymphocytes Relative: 28 %
Lymphs Abs: 1.5 10*3/uL (ref 0.7–4.0)
MCH: 30.9 pg (ref 26.0–34.0)
MCHC: 34.3 g/dL (ref 30.0–36.0)
MCV: 90.1 fL (ref 80.0–100.0)
Monocytes Absolute: 0.7 10*3/uL (ref 0.1–1.0)
Monocytes Relative: 13 %
Neutro Abs: 3.1 10*3/uL (ref 1.7–7.7)
Neutrophils Relative %: 57 %
Platelets: 308 10*3/uL (ref 150–400)
RBC: 4.53 MIL/uL (ref 4.22–5.81)
RDW: 12.9 % (ref 11.5–15.5)
WBC: 5.4 10*3/uL (ref 4.0–10.5)
nRBC: 0 % (ref 0.0–0.2)

## 2019-11-09 LAB — CREATININE, SERUM
Creatinine, Ser: 0.93 mg/dL (ref 0.61–1.24)
GFR calc Af Amer: >60 mL/min (ref >60)
GFR calc non Af Amer: >60 mL/min (ref >60)

## 2019-11-09 LAB — CBC
HCT: 38.8 % — ABNORMAL LOW (ref 39.0–52.0)
Hemoglobin: 13.5 g/dL (ref 13.0–17.0)
MCH: 31.2 pg (ref 26.0–34.0)
MCHC: 34.8 g/dL (ref 30.0–36.0)
MCV: 89.6 fL (ref 80.0–100.0)
Platelets: 301 10*3/uL (ref 150–400)
RBC: 4.33 MIL/uL (ref 4.22–5.81)
RDW: 12.7 % (ref 11.5–15.5)
WBC: 9.1 10*3/uL (ref 4.0–10.5)
nRBC: 0 % (ref 0.0–0.2)

## 2019-11-09 LAB — SARS CORONAVIRUS 2 BY RT PCR (HOSPITAL ORDER, PERFORMED IN ~~LOC~~ HOSPITAL LAB): SARS Coronavirus 2: NEGATIVE

## 2019-11-09 LAB — TYPE AND SCREEN
ABO/RH(D): B POS
Antibody Screen: NEGATIVE

## 2019-11-09 LAB — ABO/RH: ABO/RH(D): B POS

## 2019-11-09 LAB — GLUCOSE, CAPILLARY: Glucose-Capillary: 153 mg/dL — ABNORMAL HIGH (ref 70–99)

## 2019-11-09 SURGERY — CREATION, TRACHEOSTOMY
Anesthesia: General | Site: Throat

## 2019-11-09 MED ORDER — SODIUM CHLORIDE 0.9% FLUSH
3.0000 mL | Freq: Once | INTRAVENOUS | Status: AC
  Administered 2019-11-09: 3 mL via INTRAVENOUS

## 2019-11-09 MED ORDER — EPINEPHRINE HCL (NASAL) 0.1 % NA SOLN
NASAL | Status: AC
  Filled 2019-11-09: qty 30

## 2019-11-09 MED ORDER — HEPARIN SODIUM (PORCINE) 5000 UNIT/ML IJ SOLN: 5000.0000 [IU] | Freq: Three times a day (TID) | INTRAMUSCULAR | Status: DC

## 2019-11-09 MED ORDER — HYDRALAZINE HCL 20 MG/ML IJ SOLN: 10.0000 mg | Freq: Four times a day (QID) | INTRAMUSCULAR | Status: DC | PRN

## 2019-11-09 MED ORDER — MIDAZOLAM HCL 2 MG/2ML IJ SOLN
INTRAMUSCULAR | Status: DC | PRN
  Administered 2019-11-09: 1 mg via INTRAVENOUS
  Administered 2019-11-09 (×2): .5 mg via INTRAVENOUS

## 2019-11-09 MED ORDER — MIDAZOLAM HCL 2 MG/2ML IJ SOLN
INTRAMUSCULAR | Status: AC
  Filled 2019-11-09: qty 2

## 2019-11-09 MED ORDER — LIDOCAINE 2% (20 MG/ML) 5 ML SYRINGE
INTRAMUSCULAR | Status: AC
  Filled 2019-11-09: qty 5

## 2019-11-09 MED ORDER — ONDANSETRON HCL 4 MG/2ML IJ SOLN
INTRAMUSCULAR | Status: AC
  Filled 2019-11-09: qty 2

## 2019-11-09 MED ORDER — PANTOPRAZOLE SODIUM 40 MG IV SOLR
40.0000 mg | Freq: Every day | INTRAVENOUS | Status: DC
  Administered 2019-11-09 – 2019-11-10 (×2): 40 mg via INTRAVENOUS
  Filled 2019-11-09 (×2): qty 40

## 2019-11-09 MED ORDER — LIDOCAINE HCL (PF) 4 % IJ SOLN
5.0000 mL | Freq: Once | INTRAMUSCULAR | Status: AC
  Administered 2019-11-09: 5 mL
  Filled 2019-11-09: qty 5

## 2019-11-09 MED ORDER — FENTANYL CITRATE (PF) 100 MCG/2ML IJ SOLN
INTRAMUSCULAR | Status: AC
  Administered 2019-11-09: 50 ug via INTRAVENOUS
  Filled 2019-11-09: qty 2

## 2019-11-09 MED ORDER — ROCURONIUM BROMIDE 10 MG/ML (PF) SYRINGE
PREFILLED_SYRINGE | INTRAVENOUS | Status: DC | PRN
  Administered 2019-11-09: 30 mg via INTRAVENOUS
  Administered 2019-11-09 (×2): 20 mg via INTRAVENOUS

## 2019-11-09 MED ORDER — KETAMINE HCL 50 MG/5ML IJ SOSY
PREFILLED_SYRINGE | INTRAMUSCULAR | Status: AC
  Filled 2019-11-09: qty 5

## 2019-11-09 MED ORDER — LACTATED RINGERS IV SOLN: INTRAVENOUS | Status: DC | PRN

## 2019-11-09 MED ORDER — RACEPINEPHRINE HCL 2.25 % IN NEBU
0.5000 mL | INHALATION_SOLUTION | Freq: Once | RESPIRATORY_TRACT | Status: AC
  Administered 2019-11-09: 0.5 mL via RESPIRATORY_TRACT
  Filled 2019-11-09: qty 0.5

## 2019-11-09 MED ORDER — ALBUTEROL SULFATE (2.5 MG/3ML) 0.083% IN NEBU: 2.5000 mg | INHALATION_SOLUTION | RESPIRATORY_TRACT | Status: DC

## 2019-11-09 MED ORDER — LIDOCAINE-EPINEPHRINE 1 %-1:100000 IJ SOLN
INTRAMUSCULAR | Status: AC
  Filled 2019-11-09: qty 1

## 2019-11-09 MED ORDER — AMISULPRIDE (ANTIEMETIC) 5 MG/2ML IV SOLN
10.0000 mg | Freq: Once | INTRAVENOUS | Status: DC | PRN
Start: 2019-11-09 — End: 2019-11-09

## 2019-11-09 MED ORDER — FENTANYL CITRATE (PF) 100 MCG/2ML IJ SOLN
25.0000 ug | INTRAMUSCULAR | Status: DC | PRN
  Administered 2019-11-09: 50 ug via INTRAVENOUS

## 2019-11-09 MED ORDER — DOCUSATE SODIUM 100 MG PO CAPS: 100.0000 mg | ORAL_CAPSULE | Freq: Two times a day (BID) | ORAL | Status: DC | PRN

## 2019-11-09 MED ORDER — PROPOFOL 10 MG/ML IV BOLUS
INTRAVENOUS | Status: AC
  Filled 2019-11-09: qty 20

## 2019-11-09 MED ORDER — ONDANSETRON HCL 4 MG/2ML IJ SOLN
INTRAMUSCULAR | Status: DC | PRN
  Administered 2019-11-09: 4 mg via INTRAVENOUS

## 2019-11-09 MED ORDER — EPINEPHRINE HCL (NASAL) 0.1 % NA SOLN
NASAL | Status: DC | PRN
  Administered 2019-11-09: 1 [drp] via NASAL

## 2019-11-09 MED ORDER — PROPOFOL 10 MG/ML IV BOLUS
INTRAVENOUS | Status: DC | PRN
  Administered 2019-11-09: 2 mg via INTRAVENOUS

## 2019-11-09 MED ORDER — IPRATROPIUM-ALBUTEROL 0.5-2.5 (3) MG/3ML IN SOLN
3.0000 mL | Freq: Four times a day (QID) | RESPIRATORY_TRACT | Status: DC
  Administered 2019-11-09 – 2019-11-10 (×3): 3 mL via RESPIRATORY_TRACT
  Filled 2019-11-09 (×3): qty 3

## 2019-11-09 MED ORDER — SUGAMMADEX SODIUM 200 MG/2ML IV SOLN
INTRAVENOUS | Status: DC | PRN
  Administered 2019-11-09: 200 mg via INTRAVENOUS
  Administered 2019-11-09: 100 mg via INTRAVENOUS

## 2019-11-09 MED ORDER — DEXAMETHASONE SODIUM PHOSPHATE 10 MG/ML IJ SOLN
INTRAMUSCULAR | Status: AC
  Filled 2019-11-09: qty 1

## 2019-11-09 MED ORDER — DEXAMETHASONE SODIUM PHOSPHATE 10 MG/ML IJ SOLN
10.0000 mg | Freq: Once | INTRAMUSCULAR | Status: AC
  Administered 2019-11-09: 10 mg via INTRAVENOUS
  Filled 2019-11-09: qty 1

## 2019-11-09 MED ORDER — ONDANSETRON HCL 4 MG/2ML IJ SOLN: 4.0000 mg | Freq: Four times a day (QID) | INTRAMUSCULAR | Status: DC | PRN

## 2019-11-09 MED ORDER — PHENYLEPHRINE 40 MCG/ML (10ML) SYRINGE FOR IV PUSH (FOR BLOOD PRESSURE SUPPORT)
PREFILLED_SYRINGE | INTRAVENOUS | Status: AC
  Filled 2019-11-09: qty 20

## 2019-11-09 MED ORDER — POLYETHYLENE GLYCOL 3350 17 G PO PACK: 17.0000 g | PACK | Freq: Every day | ORAL | Status: DC | PRN

## 2019-11-09 MED ORDER — TRIAMCINOLONE ACETONIDE 40 MG/ML IJ SUSP
INTRAMUSCULAR | Status: AC
  Filled 2019-11-09: qty 5

## 2019-11-09 MED ORDER — PHENYLEPHRINE 40 MCG/ML (10ML) SYRINGE FOR IV PUSH (FOR BLOOD PRESSURE SUPPORT)
PREFILLED_SYRINGE | INTRAVENOUS | Status: DC | PRN
  Administered 2019-11-09 (×2): 120 ug via INTRAVENOUS

## 2019-11-09 MED ORDER — ALBUTEROL SULFATE (2.5 MG/3ML) 0.083% IN NEBU: 2.5000 mg | INHALATION_SOLUTION | RESPIRATORY_TRACT | Status: DC | PRN

## 2019-11-09 MED ORDER — LIDOCAINE-EPINEPHRINE 1 %-1:100000 IJ SOLN
INTRAMUSCULAR | Status: DC | PRN
  Administered 2019-11-09: 9.5 mL

## 2019-11-09 MED ORDER — IPRATROPIUM-ALBUTEROL 0.5-2.5 (3) MG/3ML IN SOLN: 3.0000 mL | Freq: Four times a day (QID) | RESPIRATORY_TRACT | Status: DC

## 2019-11-09 MED ORDER — KETAMINE HCL 10 MG/ML IJ SOLN
INTRAMUSCULAR | Status: DC | PRN
  Administered 2019-11-09: 10 mg via INTRAVENOUS
  Administered 2019-11-09: 5 mg via INTRAVENOUS
  Administered 2019-11-09: 10 mg via INTRAVENOUS

## 2019-11-09 MED ORDER — ROCURONIUM BROMIDE 10 MG/ML (PF) SYRINGE
PREFILLED_SYRINGE | INTRAVENOUS | Status: AC
  Filled 2019-11-09: qty 20

## 2019-11-09 MED ORDER — TRIAMCINOLONE ACETONIDE 40 MG/ML IJ SUSP
INTRAMUSCULAR | Status: DC | PRN
  Administered 2019-11-09: .35 mL

## 2019-11-09 MED ORDER — FENTANYL CITRATE (PF) 250 MCG/5ML IJ SOLN
INTRAMUSCULAR | Status: AC
  Filled 2019-11-09: qty 5

## 2019-11-09 SURGICAL SUPPLY — 56 items
BALLN PULM 15 16.5 18 X 75CM (BALLOONS)
BALLN PULM 15 16.5 18X75 (BALLOONS)
BALLOON PULM 15 16.5 18X75 (BALLOONS) IMPLANT
BLADE CLIPPER SURG (BLADE) IMPLANT
BLADE SURG 15 STRL LF DISP TIS (BLADE) ×3 IMPLANT
BLADE SURG 15 STRL SS (BLADE) ×2
CANISTER SUCT 3000ML PPV (MISCELLANEOUS) ×5 IMPLANT
CLEANER TIP ELECTROSURG 2X2 (MISCELLANEOUS) ×5 IMPLANT
CNTNR URN SCR LID CUP LEK RST (MISCELLANEOUS) IMPLANT
CONT SPEC 4OZ STRL OR WHT (MISCELLANEOUS)
CORD BIPOLAR FORCEPS 12FT (ELECTRODE) ×5 IMPLANT
COVER BACK TABLE 60X90IN (DRAPES) ×5 IMPLANT
COVER MAYO STAND STRL (DRAPES) ×5 IMPLANT
COVER SURGICAL LIGHT HANDLE (MISCELLANEOUS) ×5 IMPLANT
COVER WAND RF STERILE (DRAPES) ×5 IMPLANT
DECANTER SPIKE VIAL GLASS SM (MISCELLANEOUS) ×5 IMPLANT
DRAPE HALF SHEET 40X57 (DRAPES) IMPLANT
DRSG DRAWTEX TRACH 4X4 (GAUZE/BANDAGES/DRESSINGS) ×5 IMPLANT
ELECT COATED BLADE 2.86 ST (ELECTRODE) ×5 IMPLANT
ELECT REM PT RETURN 9FT ADLT (ELECTROSURGICAL) ×5
ELECTRODE REM PT RTRN 9FT ADLT (ELECTROSURGICAL) ×3 IMPLANT
FORCEPS BIPOLAR SPETZLER 8 1.0 (NEUROSURGERY SUPPLIES) ×5 IMPLANT
GAUZE 4X4 16PLY RFD (DISPOSABLE) ×5 IMPLANT
GAUZE PETROLATUM 1 X8 (GAUZE/BANDAGES/DRESSINGS) IMPLANT
GLOVE BIO SURGEON STRL SZ7.5 (GLOVE) ×5 IMPLANT
GOWN STRL REUS W/ TWL LRG LVL3 (GOWN DISPOSABLE) ×6 IMPLANT
GOWN STRL REUS W/TWL LRG LVL3 (GOWN DISPOSABLE) ×4
GUARD TEETH (MISCELLANEOUS) ×5 IMPLANT
HOLDER TRACH TUBE VELCRO 19.5 (MISCELLANEOUS) ×5 IMPLANT
KIT BASIN OR (CUSTOM PROCEDURE TRAY) ×5 IMPLANT
KIT SUCTION CATH 14FR (SUCTIONS) IMPLANT
KIT TURNOVER KIT B (KITS) ×5 IMPLANT
NEEDLE HYPO 25GX1X1/2 BEV (NEEDLE) IMPLANT
NEEDLE TRANS ORAL INJECTION (NEEDLE) IMPLANT
NS IRRIG 1000ML POUR BTL (IV SOLUTION) ×5 IMPLANT
PAD ARMBOARD 7.5X6 YLW CONV (MISCELLANEOUS) ×10 IMPLANT
PATTIES SURGICAL .5 X3 (DISPOSABLE) IMPLANT
POSITIONER HEAD DONUT 9IN (MISCELLANEOUS) ×5 IMPLANT
SOL ANTI FOG 6CC (MISCELLANEOUS) IMPLANT
SOLUTION ANTI FOG 6CC (MISCELLANEOUS)
SPONGE DRAIN TRACH 4X4 STRL 2S (GAUZE/BANDAGES/DRESSINGS) ×5 IMPLANT
SPONGE INTESTINAL PEANUT (DISPOSABLE) ×5 IMPLANT
SURGILUBE 2OZ TUBE FLIPTOP (MISCELLANEOUS) IMPLANT
SUT SILK 0 FSL (SUTURE) ×5 IMPLANT
SUT SILK 2 0 REEL (SUTURE) ×5 IMPLANT
SUT SILK 2 0 SH CR/8 (SUTURE) ×5 IMPLANT
SUT VIC AB 2-0 FS1 27 (SUTURE) IMPLANT
SYR 20ML ECCENTRIC (SYRINGE) ×5 IMPLANT
SYR BULB EAR ULCER 3OZ GRN STR (SYRINGE) ×5 IMPLANT
SYR CONTROL 10ML LL (SYRINGE) ×5 IMPLANT
TOWEL GREEN STERILE FF (TOWEL DISPOSABLE) ×10 IMPLANT
TRAY ENT MC OR (CUSTOM PROCEDURE TRAY) ×5 IMPLANT
TUBE CONNECTING 12'X1/4 (SUCTIONS) ×1
TUBE CONNECTING 12X1/4 (SUCTIONS) ×4 IMPLANT
TUBE TRACH SHILEY 8 DIST CUF (TUBING) ×5 IMPLANT
WATER STERILE IRR 1000ML POUR (IV SOLUTION) ×5 IMPLANT

## 2019-11-09 NOTE — Procedures (Signed)
Preop diagnosis: Inspiratory stridor, respiratory distress Postop diagnosis: same Procedure: Transnasal fiberoptic laryngoscopy Surgeon: [name] Anesth: Topical with 4% lidocaine Compl: None Findings: Both vocal folds are immobile and in the paramedian position resulting in a small slit through which to breathe. Description:  After discussing risks, benefits, and alternatives, the patient was placed in a seated position and the right nasal passage was sprayed with topical anesthetic.  The fiberoptic scope was passed through the right nasal passage to view the pharynx and larynx.  Findings are noted above.  The scope was then removed and he was returned to nursing care in stable condition.

## 2019-11-09 NOTE — Anesthesia Preprocedure Evaluation (Signed)
Anesthesia Evaluation  Patient identified by MRN, date of birth, ID band Patient awake    Reviewed: Allergy & Precautions, NPO status Preop documentation limited or incomplete due to emergent nature of procedure.  Airway Mallampati: II  TM Distance: >3 FB     Dental  (+) Dental Advisory Given   Pulmonary Current Smoker,    breath sounds clear to auscultation  + stridor     Cardiovascular hypertension,  Rhythm:Regular Rate:Normal     Neuro/Psych negative neurological ROS     GI/Hepatic   Endo/Other    Renal/GU      Musculoskeletal   Abdominal   Peds  Hematology   Anesthesia Other Findings   Reproductive/Obstetrics                             Anesthesia Physical Anesthesia Plan  ASA: V and emergent  Anesthesia Plan: General   Post-op Pain Management:    Induction: Intravenous  PONV Risk Score and Plan: 1 and Ondansetron, Dexamethasone and Treatment may vary due to age or medical condition  Airway Management Planned: Tracheostomy  Additional Equipment:   Intra-op Plan:   Post-operative Plan:   Informed Consent: I have reviewed the patients History and Physical, chart, labs and discussed the procedure including the risks, benefits and alternatives for the proposed anesthesia with the patient or authorized representative who has indicated his/her understanding and acceptance.     Dental advisory given  Plan Discussed with: CRNA  Anesthesia Plan Comments: (Pt with airway compromise and stridor. Plan for awake trach followed by general anesthesia for DL by Dr. [name].)        Anesthesia Quick Evaluation

## 2019-11-09 NOTE — ED Triage Notes (Signed)
Pt c/o increase SOB, denies any CP on triage, pt able to talk on complete sentences, but is using accesory muscle to breath. Pt denies any covid exposure and is vaccinated for covid.

## 2019-11-09 NOTE — H&P (Signed)
NAME:  Marco Pittman, MRN:  [id number], DOB:  1960/10/08, LOS: 0 ADMISSION DATE:  11/09/2019, CONSULTATION DATE:  11/09/2019 REFERRING MD:  Dr. 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 Taken emergent to OR from ED for an awake tracheostomy. PCCM asked to admit.    History of present illness   Marco Pittman is a 59 y.o. male with a PMH significant for hypertension, pancreatitis, ETOH abuse, aneurysm, angioedema, and PE who presents with chief complaint of shortness of breath. Per patient the shortness of breath coupled with stridor began 2 days prior to admission and has progressively worsened.   On arrival patient was seen in respiratory distress with tachypnea and stridor. Initial labwork was unremarkable. Given presentation ENT was consulted in ED and preformed bedside fiberoptic exam that revealed imobile vocal cords with only slit glottic space for breathing therefore decision was made to take patient for emergent awake tracheostomy per ENT.   Of note patient has underwent multiple intubations and extubations over the last few weeks as follows: Admitted for angioedema requiring intubation 7/15-7/18, discharged 7/21 Readmitted 7/26 with SOB 2/2 aspiration PNA and PE intubated 7/26-7/27 Developed upper airway wheeze and required reintubation 7/27-7/29   Past Medical History  ETHO abuse Pancreatitis Hypertension PE Angioedema   Significant Hospital Events   Admitted and underwent emergent awake trach 8/4  Consults:  ENT  Procedures:  Awake tracheostomy with ENT 8/4   Significant Diagnostic Tests:  DG soft neck 8/4 > negative  CXR 8/4 > negative   Micro Data:  COVID 8/4 >   Antimicrobials:     Interim history/subjective:  Lying in stretcher in PACU, unable to phonate but was able to mouth words and nonverbal  communication.   Objective   Blood pressure (!) 179/105, pulse 97, temperature 97.8 F (36.6 C), temperature source Oral, resp. rate (!) 26, SpO2 95 %.       No intake or output data in the 24 hours ending 11/09/19 1614 There were no vitals filed for this visit.  Examination: General: Middle aged male in no acute distress lying in bed  HEENT: 8 cuffed shiley trach mdline, MM pink/moist, PERRL, sclera non-icteric  Neuro: Alert and oriented x3, non-focal  CV: s1s2 regular rate and rhythm, no murmur, rubs, or gallops,  PULM:  Clear to ascultation, pink tinged productive cough GI: soft, bowel sounds active in all 4 quadrants, non-tender, non-distended Extremities: warm/dry, no edema  Skin: no rashes or lesions  Resolved Hospital Problem list     Assessment & Plan:  Subglottic stenosis  -In the setting of multiple intubations and extubations over the last few weeks.  Respiratory insuffiencey  P: ENT consulted and emergently preformed awake tracheostomy  Continue ATC  Consult SLP Wean supplemental oxygen as able Head of bed elevated 30 degrees Follow intermittent chest x-ray and ABG Ensure adequate pulmonary hygiene  Ensure pain control   Recent PE -Diagnosed with subsegmental PE and placed on Eliquis 7/31 P: Hold home Eliquis Coordinate with ENT prior to resumption  Can likely resume tomorrow if not may need to consider heparin drip   Hx of Hypertension -Home medications include Coreg, and norvasc P: Hold home medications until PO can be taken  PRN IV antihypertensives  HX of ETOH abuse  P: CIWA scale  PAD protocol if intubated   Best practice:  Diet: NPO Pain/Anxiety/Delirium protocol (if indicated): PRNs VAP protocol (if indicated): In place DVT prophylaxis: Home Eliquis on hold GI prophylaxis: PPI Glucose control: SSI Mobility: Progress activity as toelrated   Code Status: Full Family Communication: Will updated  Disposition: ICU  Labs   CBC: Recent  Labs  Lab 11/03/19 0305 11/04/19 0206 11/05/19 0316 11/09/19 1500 11/09/19 1551  WBC 10.1 8.9 8.3 5.4  --   NEUTROABS  --   --   --  3.1  --   HGB 12.7* 12.9* 12.9* 14.0 15.0  HCT 36.9* 37.0* 37.9* 40.8 44.0  MCV 92.7 90.9 91.5 90.1  --   PLT 242 272 233 308  --     Basic Metabolic Panel: Recent Labs  Lab 11/02/19 1636 11/03/19 0305 11/04/19 0206 11/05/19 0316 11/09/19 1551  NA  --  149* 147* 139 142  K  --  3.2* 3.7 4.1 3.9  CL  --  115* 114* 109 101  CO2  --  22 23 21*  --   GLUCOSE  --  99 132* 127* 108*  BUN  --  17 15 17 8   CREATININE  --  1.00 1.00 0.93 0.70  CALCIUM  --  8.6* 8.8* 8.6*  --   MG 2.5*  --   --   --   --   PHOS 2.3*  --   --   --   --    GFR: Estimated Creatinine Clearance: 98.6 mL/min (by C-G formula based on SCr of 0.7 mg/dL). Recent Labs  Lab 11/03/19 0305 11/04/19 0206 11/05/19 0316 11/09/19 1500  WBC 10.1 8.9 8.3 5.4    Liver Function Tests: No results for input(s): AST, ALT, ALKPHOS, BILITOT, PROT, ALBUMIN in the last 168 hours. No results for input(s): LIPASE, AMYLASE in the last 168 hours. No results for input(s): AMMONIA in the last 168 hours.  ABG    Component Value Date/Time   PHART 7.379 11/01/2019 1337   PCO2ART 50.2 (H) 11/01/2019 1337   PO2ART 309 (H) 11/01/2019 1337   HCO3 29.8 (H) 11/01/2019 1337   TCO2 30 11/09/2019 1551   O2SAT 100.0 11/01/2019 1337     Coagulation Profile: No results for input(s): INR, PROTIME in the last 168 hours.  Cardiac Enzymes: No results for input(s): CKTOTAL, CKMB, CKMBINDEX, TROPONINI in the last 168 hours.  HbA1C: Hemoglobin A1C  Date/Time Value Ref Range Status  05/05/2018 09:12 AM 5.9 (A) 4.0 - 5.6 % Final  10/10/2016 09:20 AM 6.1  Final   Hgb A1c MFr Bld  Date/Time Value Ref Range Status  10/18/2019 03:09 PM 5.9 (H) 4.8 - 5.6 % Final    Comment:             Prediabetes: 5.7 - 6.4          Diabetes: >6.4          Glycemic control for adults with diabetes: <7.0    03/05/2016 05:43 AM 5.9 (H) 4.8 - 5.6 % Final    Comment:    (NOTE)         Pre-diabetes: 5.7 - 6.4         Diabetes: >6.4         Glycemic control for adults with diabetes: <7.0     CBG: Recent Labs  Lab 11/04/19 1121 11/04/19 1538 11/04/19 2058 11/05/19 0641 11/05/19 1143  GLUCAP 117* 87 114* 99 139*    Review of Systems:   Gen: Denies fever, chills, weight change, fatigue, night sweats  HEENT: Denies blurred vision, double vision, hearing loss, tinnitus, sinus congestion, rhinorrhea, sore throat, neck stiffness, dysphagia PULM: Denies shortness of breath, cough, sputum production, hemoptysis, wheezing, stridor  CV: Denies chest pain, edema, orthopnea, paroxysmal nocturnal dyspnea, palpitations GI: Denies abdominal pain, nausea, vomiting, diarrhea, hematochezia, melena, constipation, change in bowel habits GU: Denies dysuria, hematuria, polyuria, oliguria, urethral discharge Endocrine: Denies hot or cold intolerance, polyuria, polyphagia or appetite change Derm: Denies rash, dry skin, scaling or peeling skin change Heme: Denies easy bruising, bleeding, bleeding gums Neuro: Denies headache, numbness, weakness, slurred speech, loss of memory or consciousness  Past Medical History  He,  has a past medical history of Aneurysm (HCC), ETOH abuse, Hypertension, Pancreatitis (03/04/2016), and Umbilical hernia.   Surgical History    Past Surgical History:  Procedure Laterality Date  . INSERTION OF MESH N/A 04/14/2016   Procedure: INSERTION OF MESH;  Surgeon: Avel Peace, MD;  Location: [facility];  Service: General;  Laterality: N/A;  . UMBILICAL HERNIA REPAIR N/A 04/14/2016   Procedure: UMBILICAL HERNIA REPAIR WITH MESH;  Surgeon: Avel Peace, MD;  Location: [facility];  Service: General;  Laterality: N/A;     Social History   reports that he has been smoking cigars. He has a 22.50 pack-year smoking history. He has never used smokeless  tobacco. He reports current alcohol use of about 14.0 standard drinks of alcohol per week. He reports that he does not use drugs.   Family History   His family history includes Cancer (age of onset: 68) in his father.   Allergies Allergies  Allergen Reactions  . Lisinopril Swelling    Angioedema     Home Medications  Prior to Admission medications   Medication Sig Start Date End Date Taking? Authorizing Provider  amLODipine (NORVASC) 10 MG tablet TAKE 1 TABLET (10 MG TOTAL) BY MOUTH DAILY. TO LOWER BLOOD PRESSURE Patient taking differently: Take 10 mg by mouth daily.  11/07/19  Yes Marcine Matar, MD  apixaban (ELIQUIS) 5 MG TABS tablet Take 2 tablets BID through 8/2, then 1 tablet BID 11/05/19  Yes Ollis, Brandi L, NP  carvedilol (COREG) 3.125 MG tablet Take 1 tablet (3.125 mg total) by mouth 2 (two) times daily with a meal. 10/20/19  Yes Marcine Matar, MD  Multiple Vitamin (MULTIVITAMIN WITH MINERALS) TABS tablet Take 1 tablet by mouth daily. 11/06/19  Yes Ollis, Merry Proud L, NP  thiamine 100 MG tablet Take 1 tablet (100 mg total) by mouth daily. 11/06/19  Yes Ollis, Brandi L, NP  ursodiol (ACTIGALL) 500 MG tablet TAKE 1 TABLET BY MOUTH 3 (THREE) TIMES DAILY. Patient taking differently: Take 500 mg by mouth 3 (three) times daily.  10/18/19  Yes Marcine Matar, MD     Critical care time:    Performed by: Delfin Gant  Total critical care time: 40 minutes  Critical care time was exclusive of separately billable procedures and treating other patients.  Critical care was necessary to treat or prevent imminent or life-threatening deterioration.  Critical care was time spent personally by me on the following activities: development of treatment plan with patient and/or surrogate as well as nursing, discussions with consultants, evaluation of patient's response to treatment, examination of patient, obtaining history from patient or surrogate, ordering and performing treatments and  interventions, ordering and review of laboratory studies, ordering and review of radiographic studies, pulse oximetry and re-evaluation of patient's condition.  Delfin Gant, NP-C [facility] Contact /  Pager information can be found on Amion  11/09/2019, 5:28 PM

## 2019-11-09 NOTE — Transfer of Care (Signed)
Immediate Anesthesia Transfer of Care Note  Patient: Marco Pittman  Procedure(s) Performed: AWAKE TRACHEOSTOMY (N/A Neck) MICROLARYNGOSCOPY (N/A Throat)  Patient Location: PACU  Anesthesia Type:General  Level of Consciousness: awake and alert   Airway & Oxygen Therapy: Patient Spontanous Breathing and Patient connected to tracheostomy mask oxygen  Post-op Assessment: Report given to RN  Post vital signs: Reviewed and stable  Last Vitals:  Vitals Value Taken Time  BP    Temp    Pulse    Resp    SpO2      Last Pain:  Vitals:   11/09/19 1538  TempSrc:   PainSc: 0-No pain         Complications: No complications documented.

## 2019-11-09 NOTE — Consult Note (Signed)
Reason for Consult: Inspiratory stridor Referring Physician: ER  Marco Pittman is an 59 y.o. male.  HPI: 59 year old male was recently hospitalized for angioedema 7/15 and required intubation and medical therapy.  He was extubated 7/18 and discharged 7/21.  He was readmitted 7/26 due to shortness of breath and found to have subsegmental PI and evidence of aspiration pneumonia.  He was reintubated 7/26 and extubated the next day.  With "upper airway wheezing," he required reintubation.  He was again extubated on 7/29 and was discharged on 7/31.  He returns to the ER today due to two days of worsening inspiratory stridor and respiratory distress.  Past Medical History:  Diagnosis Date  . Aneurysm (HCC)   . ETOH abuse   . Hypertension   . Pancreatitis 03/04/2016  . Umbilical hernia    symptomatic umbilical hernia/notes 03/04/2016    Past Surgical History:  Procedure Laterality Date  . INSERTION OF MESH N/A 04/14/2016   Procedure: INSERTION OF MESH;  Surgeon: Avel Peace, MD;  Location: [facility];  Service: General;  Laterality: N/A;  . UMBILICAL HERNIA REPAIR N/A 04/14/2016   Procedure: UMBILICAL HERNIA REPAIR WITH MESH;  Surgeon: Avel Peace, MD;  Location: [facility];  Service: General;  Laterality: N/A;    Family History  Problem Relation Age of Onset  . Cancer Father 22       colon    Social History:  reports that he has been smoking cigars. He has a 22.50 pack-year smoking history. He has never used smokeless tobacco. He reports current alcohol use of about 14.0 standard drinks of alcohol per week. He reports that he does not use drugs.  Allergies:  Allergies  Allergen Reactions  . Lisinopril Swelling    Angioedema    Medications: I have reviewed the patient's current medications.  No results found for this or any previous visit (from the past 48 hour(s)).  DG Neck Soft Tissue  Result Date: 11/09/2019 CLINICAL DATA:  Shortness of  breath. EXAM: NECK SOFT TISSUES - 1+ VIEW COMPARISON:  None. FINDINGS: There is no evidence of retropharyngeal soft tissue swelling or epiglottic enlargement. The cervical airway is unremarkable and no radio-opaque foreign body identified. IMPRESSION: Negative. Electronically Signed   By: Lupita Raider M.D.   On: 11/09/2019 15:26   DG Chest Portable 1 View  Result Date: 11/09/2019 CLINICAL DATA:  Shortness of breath. EXAM: PORTABLE CHEST 1 VIEW COMPARISON:  November 03, 2019. FINDINGS: The heart size and mediastinal contours are within normal limits. Both lungs are clear. No pneumothorax or pleural effusion is noted. The visualized skeletal structures are unremarkable. IMPRESSION: No active disease. Electronically Signed   By: Lupita Raider M.D.   On: 11/09/2019 15:27    Review of Systems  Respiratory: Positive for shortness of breath and stridor.   All other systems reviewed and are negative.  Blood pressure (!) 151/84, pulse 98, temperature 97.8 F (36.6 C), temperature source Oral, resp. rate (!) 22, SpO2 99 %. Physical Exam Constitutional:      Appearance: He is well-developed and normal weight.  HENT:     Head: Normocephalic and atraumatic.     Mouth/Throat:     Mouth: Mucous membranes are moist.     Pharynx: Oropharynx is clear.  Eyes:     Extraocular Movements: Extraocular movements intact.     Pupils: Pupils are equal, round, and reactive to light.  Cardiovascular:     Rate and Rhythm: Tachycardia  present.  Pulmonary:     Effort: Tachypnea present.     Comments: Marked inspiratory stridor with increased work of breathing.  Normal voice. Musculoskeletal:     Cervical back: Normal range of motion.  Skin:    General: Skin is warm.  Neurological:     General: No focal deficit present.     Mental Status: He is alert and oriented to person, place, and time.  Psychiatric:        Mood and Affect: Mood normal.        Behavior: Behavior normal.     Assessment/Plan: Inspiratory  stridor, respiratory distress  Fiberoptic exam of the larynx was performed at the bedside demonstrating immobile vocal folds with only a slit of glottic space for breathing.  I discussed this finding with him and recommended proceeding emergently with awake tracheostomy followed by micro direct laryngoscopy.  [name] 11/09/2019, 3:36 PM

## 2019-11-09 NOTE — Brief Op Note (Signed)
11/09/2019  4:44 PM  PATIENT:  Marco Pittman  59 y.o. male  PRE-OPERATIVE DIAGNOSIS:  Respiratory Failure, inspiratory stridor  POST-OPERATIVE DIAGNOSIS:  Respiratory Failure, inspiratory stridor  PROCEDURE:  Procedure(s): AWAKE TRACHEOSTOMY (N/A) DIRECT LARYNGOSCOPY (N/A) with therapeutic injection  SURGEON:  Surgeon(s) and Role:    [name], MD - Primary  PHYSICIAN ASSISTANT:   ASSISTANTS: none   ANESTHESIA:   IV sedation followed by general  EBL:  Minimal  BLOOD ADMINISTERED:none  DRAINS: none   LOCAL MEDICATIONS USED:  LIDOCAINE   SPECIMEN:  No Specimen  DISPOSITION OF SPECIMEN:  N/A  COUNTS:  YES  TOURNIQUET:  * No tourniquets in log *  DICTATION: .Note written in EPIC  PLAN OF CARE: Admit to inpatient   PATIENT DISPOSITION:  PACU - hemodynamically stable.   Delay start of Pharmacological VTE agent (>24hrs) due to surgical blood loss or risk of bleeding: no

## 2019-11-09 NOTE — ED Notes (Signed)
PA Ford notified for assessment on triage area.

## 2019-11-09 NOTE — ED Provider Notes (Signed)
[facility]PT [facility] Emergency Department Provider Note MRN:  [id number]  Arrival date & time: 11/09/19     Chief Complaint   Shortness of Breath   History of Present Illness   Marco Pittman is a 59 y.o. year-old male with a history of angioedema, hypertension, pulmonary embolism presenting to the ED with chief complaint of shortness of breath.  Patient with shortness of breath and stridor for the past 2 days, getting worse today.  Patient arrives tachypneic, in respiratory distress.  Denies any other symptoms.  No recent rash or swelling, no known allergies.  No pain.  Review of Systems  A complete 10 system review of systems was obtained and all systems are negative except as noted in the HPI and PMH.   Patient's Health History    Past Medical History:  Diagnosis Date  . Aneurysm (HCC)   . ETOH abuse   . Hypertension   . Pancreatitis 03/04/2016  . Umbilical hernia    symptomatic umbilical hernia/notes 03/04/2016    Past Surgical History:  Procedure Laterality Date  . INSERTION OF MESH N/A 04/14/2016   Procedure: INSERTION OF MESH;  Surgeon: Avel Peace, MD;  Location: [facility];  Service: General;  Laterality: N/A;  . UMBILICAL HERNIA REPAIR N/A 04/14/2016   Procedure: UMBILICAL HERNIA REPAIR WITH MESH;  Surgeon: Avel Peace, MD;  Location: [facility];  Service: General;  Laterality: N/A;    Family History  Problem Relation Age of Onset  . Cancer Father 44       colon    Social History   Socioeconomic History  . Marital status: Single    Spouse name: Not on file  . Number of children: Not on file  . Years of education: Not on file  . Highest education level: Not on file  Occupational History  . Not on file  Tobacco Use  . Smoking status: Current Every Day Smoker    Packs/day: 0.50    Years: 45.00    Pack years: 22.50    Types: Cigars  . Smokeless tobacco: Never Used  Substance and Sexual Activity  .  Alcohol use: Yes    Alcohol/week: 14.0 standard drinks    Types: 14 Cans of beer per week    Comment: last alcohol on 03/29/16  . Drug use: No  . Sexual activity: Yes  Other Topics Concern  . Not on file  Social History Narrative  . Not on file   Social Determinants of Health   Financial Resource Strain:   . Difficulty of Paying Living Expenses:   Food Insecurity:   . Worried About Programme researcher, broadcasting/film/video in the Last Year:   . Barista in the Last Year:   Transportation Needs:   . Freight forwarder (Medical):   Marland Kitchen Lack of Transportation (Non-Medical):   Physical Activity:   . Days of Exercise per Week:   . Minutes of Exercise per Session:   Stress:   . Feeling of Stress :   Social Connections:   . Frequency of Communication with Friends and Family:   . Frequency of Social Gatherings with Friends and Family:   . Attends Religious Services:   . Active Member of Clubs or Organizations:   . Attends Banker Meetings:   Marland Kitchen Marital Status:   Intimate Partner Violence:   . Fear of Current or Ex-Partner:   . Emotionally Abused:   Marland Kitchen Physically Abused:   . Sexually  Abused:      Physical Exam   Vitals:   11/09/19 1515 11/09/19 1530  BP: (!) 164/123 (!) 179/105  Pulse: 100 97  Resp: (!) 39 (!) 26  Temp:    SpO2: 96% 95%    CONSTITUTIONAL: Well-appearing, in moderate respiratory distress NEURO:  Alert and oriented x 3, no focal deficits EYES:  eyes equal and reactive ENT/NECK:  no LAD, no JVD CARDIO: Tachycardic rate, well-perfused, normal S1 and S2 PULM: Stridorous, tachypneic, mild accessory muscle use GI/GU:  normal bowel sounds, non-distended, non-tender MSK/SPINE:  No gross deformities, no edema SKIN:  no rash, atraumatic PSYCH:  Appropriate speech and behavior  *Additional and/or pertinent findings included in MDM below  Diagnostic and Interventional Summary    EKG Interpretation  Date/Time:    Ventricular Rate:    PR Interval:    QRS  Duration:   QT Interval:    QTC Calculation:   R Axis:     Text Interpretation:        Labs Reviewed  SARS CORONAVIRUS 2 BY RT PCR (HOSPITAL ORDER, PERFORMED IN [facility])  CBC WITH DIFFERENTIAL/PLATELET  COMPREHENSIVE METABOLIC PANEL  I-STAT CHEM 8, ED  TROPONIN I (HIGH SENSITIVITY)    DG Chest Portable 1 View  Final Result    DG Neck Soft Tissue  Final Result      Medications  sodium chloride flush (NS) 0.9 % injection 3 mL (3 mLs Intravenous Given 11/09/19 1504)  Racepinephrine HCl 2.25 % nebulizer solution 0.5 mL (0.5 mLs Nebulization Given 11/09/19 1504)  dexamethasone (DECADRON) injection 10 mg (10 mg Intravenous Given 11/09/19 1503)  lidocaine (XYLOCAINE) 4 % (PF) injection 5 mL (5 mLs Other Given 11/09/19 1535)     Procedures  /  Critical Care .Critical Care Performed by: Sabas Sous, MD Authorized by: Sabas Sous, MD   Critical care provider statement:    Critical care time (minutes):  32   Critical care was necessary to treat or prevent imminent or life-threatening deterioration of the following conditions: Subglottic stenosis with respiratory distress.   Critical care was time spent personally by me on the following activities:  Discussions with consultants, evaluation of patient's response to treatment, examination of patient, ordering and performing treatments and interventions, ordering and review of laboratory studies, ordering and review of radiographic studies, pulse oximetry, re-evaluation of patient's condition, obtaining history from patient or surrogate and review of old charts    ED Course and Medical Decision Making  I have reviewed the triage vital signs, the nursing notes, and pertinent available records from the EMR.  Listed above are laboratory and imaging tests that I personally ordered, reviewed, and interpreted and then considered in my medical decision making (see below for details).      Patient arrives with stridor and  respiratory distress, concern for either angioedema, of which she has a personal history, or some type of subglottic stenosis related to recent intubation during his pulmonary embolism last month.  He is maintaining his oxygen saturations, he is able to converse, continues to have significant stridor.  No significant improvement with racemic epi, Decadron.  ENT emergently paged, room prepped with intubation equipment, backup equipment, prepared for awake intubation with atomizer and 4% lidocaine.  Dr. [name] of ENT was able to arrive quickly and performed bedside scope, patient's vocal cords are largely immobile, concern for scarring or some type of subglottic stenosis from recent intubation, to be sent directly to the OR for awake tracheostomy.  Marco Sow. 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

## 2019-11-09 NOTE — ED Notes (Signed)
RN transported pt to OR with NT and RT

## 2019-11-09 NOTE — ED Notes (Signed)
ENT paged to Dr. Pilar Plate per his request

## 2019-11-09 NOTE — ED Notes (Signed)
Pt request EDP to contact brother.

## 2019-11-09 NOTE — Op Note (Signed)
PREOPERATIVE DIAGNOSIS:  Inspiratory stridor, respiratory distress   POSTOPERATIVE DIAGNOSIS:  Inspiratory stridor, respiratory distress   PROCEDURE:  Awake tracheostomy, suspended microdirect laryngoscopy with therapeutic injection   SURGEON:  [name], MD   ANESTHESIA:  IV sedation then general   COMPLICATIONS:  None.   INDICATIONS:  The patient is a 60 year old who has been intubated multiple times in the past few weeks and presented to the ER with a couple of days of worsening inspiratory stridor.  The vocal folds were found to be immobile with only a small slit through the glottis for breathing.  He presents emergently to the operating room for surgical management.   FINDINGS:  #8 cuffed Shiley trach placed in standard position.  Both vocal folds with exudate covering posterior vocal process regions.  Kenalog was injected in the posterior commissure.   DESCRIPTION OF PROCEDURE:  The patient was moved emergently from the ER and moved to the operative suite and placed on the operative table in a beach chair position.  Verbal consent was obtained including discussion of risks, benefits and alternatives.  The patient was given light sedation.  The trach site was marked with a marking pen and injected with local anesthetic.  The anterior neck was prepped and draped in sterile fashion.  The incision was made with a 15 blade scalpel.  Subcutaneous fat was removed.  The midline raphe of the strap muscles was dissected and muscles retracted to either side.  A cricoid hook was placed to stabilize the cricoid.  The thyroid isthmus was divided with electrocautery.  Bleeding was controlled with bipolar electrocautery.  A horizontal incision was made between rings 2 and 3 using a 15 blade scalpel.  A suture was placed around the ring above the trach site as a stay suture.  A #8 cuffed Shiley trach tube was then placed in the trach site and the cuff was inflated.  The patient was successfully ventilated  and was then given general anesthesia.  The stay suture above the trach site was tied with two knots.  The trach flange was sutured to the anterior neck in four sites.  At this point, the eyes were taped closed and bed was turned 90 degrees from anesthesia.  A tooth guard was placed over the upper teeth and a Dedo laryngoscope was placed into the supraglottic position and suspended to Mayo stand using the Lewy arm.  The larynx was carefully examined under the operating microscope with findings noted above.  Some of the exudate was debrided from each vocal cord using cup forceps.  A total of 0.35 cc of Kenalog 40 was then injected using a butterfly needle into the posterior commissure and vocal process regions.  The laryngoscope was then taking out of suspension and removed from the patient's mouth while suctioning the airway.  The tooth guard was removed and the patient was turned back to anesthesia for wakeup and taken to the recovery room in stable condition.

## 2019-11-10 ENCOUNTER — Other Ambulatory Visit: Payer: Self-pay

## 2019-11-10 ENCOUNTER — Encounter (HOSPITAL_COMMUNITY): Payer: Self-pay | Admitting: Otolaryngology

## 2019-11-10 LAB — CBC
HCT: 38 % — ABNORMAL LOW (ref 39.0–52.0)
Hemoglobin: 13.6 g/dL (ref 13.0–17.0)
MCH: 32.1 pg (ref 26.0–34.0)
MCHC: 35.8 g/dL (ref 30.0–36.0)
MCV: 89.6 fL (ref 80.0–100.0)
Platelets: 360 10*3/uL (ref 150–400)
RBC: 4.24 MIL/uL (ref 4.22–5.81)
RDW: 13 % (ref 11.5–15.5)
WBC: 6.5 10*3/uL (ref 4.0–10.5)
nRBC: 0 % (ref 0.0–0.2)

## 2019-11-10 LAB — BASIC METABOLIC PANEL
Anion gap: 12 (ref 5–15)
BUN: 11 mg/dL (ref 8–23)
CO2: 26 mmol/L (ref 22–32)
Calcium: 8.9 mg/dL (ref 8.9–10.3)
Chloride: 102 mmol/L (ref 98–111)
Creatinine, Ser: 0.95 mg/dL (ref 0.61–1.24)
GFR calc Af Amer: >60 mL/min (ref >60)
GFR calc non Af Amer: >60 mL/min (ref >60)
Glucose, Bld: 149 mg/dL — ABNORMAL HIGH (ref 70–99)
Potassium: 4.1 mmol/L (ref 3.5–5.1)
Sodium: 140 mmol/L (ref 135–145)

## 2019-11-10 LAB — PHOSPHORUS: Phosphorus: 4 mg/dL (ref 2.5–4.6)

## 2019-11-10 LAB — GLUCOSE, CAPILLARY
Glucose-Capillary: 133 mg/dL — ABNORMAL HIGH (ref 70–99)
Glucose-Capillary: 119 mg/dL — ABNORMAL HIGH (ref 70–99)

## 2019-11-10 LAB — MAGNESIUM: Magnesium: 2 mg/dL (ref 1.7–2.4)

## 2019-11-10 MED ORDER — CHLORHEXIDINE GLUCONATE CLOTH 2 % EX PADS
6.0000 | MEDICATED_PAD | Freq: Every day | CUTANEOUS | Status: DC
  Administered 2019-11-10 – 2019-11-22 (×9): 6 via TOPICAL

## 2019-11-10 MED ORDER — IPRATROPIUM-ALBUTEROL 0.5-2.5 (3) MG/3ML IN SOLN: 3.0000 mL | Freq: Four times a day (QID) | RESPIRATORY_TRACT | Status: DC | PRN

## 2019-11-10 MED ORDER — ENOXAPARIN SODIUM 80 MG/0.8ML ~~LOC~~ SOLN
1.0000 mg/kg | Freq: Two times a day (BID) | SUBCUTANEOUS | Status: DC
  Administered 2019-11-10 – 2019-11-11 (×3): 72.5 mg via SUBCUTANEOUS
  Filled 2019-11-10: qty 0.72
  Filled 2019-11-10: qty 0.8
  Filled 2019-11-10: qty 0.72
  Filled 2019-11-10: qty 0.8

## 2019-11-10 NOTE — Anesthesia Postprocedure Evaluation (Signed)
Anesthesia Post Note  Patient: QUINTUS PREMO  Procedure(s) Performed: AWAKE TRACHEOSTOMY (N/A Neck) MICROLARYNGOSCOPY (N/A Throat)     Patient location during evaluation: PACU Anesthesia Type: General Level of consciousness: awake and alert Pain management: pain level controlled Vital Signs Assessment: post-procedure vital signs reviewed and stable Respiratory status: spontaneous breathing, nonlabored ventilation, respiratory function stable and patient connected to nasal cannula oxygen Cardiovascular status: blood pressure returned to baseline and stable Postop Assessment: no apparent nausea or vomiting Anesthetic complications: no   No complications documented.  Last Vitals:  Vitals:   11/10/19 1200 11/10/19 1237  BP: (!) 140/92 (!) 137/95  Pulse: 82 89  Resp: (!) 25 17  Temp:  36.8 C  SpO2: 95% 98%    Last Pain:  Vitals:   11/10/19 1237  TempSrc: Oral  PainSc: 0-No pain                 Kennieth Rad

## 2019-11-10 NOTE — Progress Notes (Addendum)
NAME:  Marco Pittman, MRN:  [id number], DOB:  1960/06/27, LOS: 1 ADMISSION DATE:  11/09/2019, CONSULTATION DATE:  11/09/2019 REFERRING MD:  Dr. 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 Taken emergent to OR from ED for an awake tracheostomy. PCCM asked to admit.    History of present illness   Marco Pittman is a 59 y.o. male with a PMH significant for hypertension, pancreatitis, ETOH abuse, aneurysm, angioedema, and PE who presents with chief complaint of shortness of breath. Per patient the shortness of breath coupled with stridor began 2 days prior to admission and has progressively worsened.   On arrival patient was seen in respiratory distress with tachypnea and stridor. Initial labwork was unremarkable. Given presentation ENT was consulted in ED and preformed bedside fiberoptic exam that revealed imobile vocal cords with only slit glottic space for breathing therefore decision was made to take patient for emergent awake tracheostomy per ENT.   Of note patient has underwent multiple intubations and extubations over the last few weeks as follows: Admitted for angioedema requiring intubation 7/15-7/18, discharged 7/21 Readmitted 7/26 with SOB 2/2 aspiration PNA and PE intubated 7/26-7/27 Developed upper airway wheeze and required reintubation 7/27-7/29   Past Medical History  ETHO abuse Pancreatitis Hypertension PE Angioedema   Significant Hospital Events   Admitted and underwent emergent awake trach 8/4  Consults:  ENT  Procedures:  Awake tracheostomy with ENT 8/4   Significant Diagnostic Tests:  DG soft neck 8/4 > negative  CXR 8/4 > negative   Micro Data:  COVID 8/4 >   Antimicrobials:     Interim history/subjective:  8/5: doing well on 35% atc. Able to communicate with mouthing words, requesting water. Advised to  wait for speech eval.  8/4:Lying in stretcher in PACU, unable to phonate but was able to mouth words and nonverbal communication.   Objective   Blood pressure (!) 147/97, pulse 84, temperature 98.3 F (36.8 C), temperature source Oral, resp. rate (!) 27, SpO2 96 %.    FiO2 (%):  [35 %] 35 %   Intake/Output Summary (Last 24 hours) at 11/10/2019 4114 Last data filed at 11/10/2019 0400 Gross per 24 hour  Intake 700 ml  Output 1205 ml  Net -505 ml   There were no vitals filed for this visit.  Examination: General: Middle aged male in no acute distress sitting up visiting with friend HEENT: 8 cuffed shiley trach mdline, MM pink/moist, PERRL, sclera non-icteric  Neuro: Alert and oriented x3, non-focal  CV: s1s2 regular rate and rhythm, no murmur, rubs, or gallops,  PULM:  Clear to ascultation, pink tinged productive cough GI: soft, bowel sounds active in all 4 quadrants, non-tender, non-distended Extremities: warm/dry, no edema  Skin: no rashes or lesions  Resolved Hospital Problem list     Assessment & Plan:  Subglottic stenosis  -In the setting of multiple intubations and extubations over the last few weeks.  Respiratory insuffiency  P: -s/p emergent trach -Continue ATC  -Consult SLP -Wean supplemental oxygen as able -Head of bed elevated 30 degrees -Ensure adequate pulmonary hygiene  -pt without complaints of pain  Recent PE -Diagnosed with subsegmental PE and placed on Eliquis 7/31 P: -restart a/c with lovenox until able to take po    Hx of Hypertension -Home medications include Coreg, and norvasc P: Hold home medications until  PO can be taken  PRN IV antihypertensives  HX of ETOH abuse  P: CIWA scale  PAD protocol if intubated   Best practice:  Diet: NPO, awaiting speech eval Pain/Anxiety/Delirium protocol (if indicated): PRNs VAP protocol (if indicated): In place DVT prophylaxis: lovenox until able to take po GI prophylaxis: PPI Glucose control:  SSI Mobility: Progress activity as tolerated   Code Status: Full Family Communication: updated champion at bedside.  Disposition: stable for transfer from ICU. CCM will ask TRH to assume care at this time and we will follow intermittently for trach care. (next eval 8/9) please call if needed sooner.   Labs   CBC: Recent Labs  Lab 11/04/19 0206 11/04/19 0206 11/05/19 0316 11/09/19 1500 11/09/19 1551 11/09/19 1829 11/10/19 0414  WBC 8.9  --  8.3 5.4  --  9.1 6.5  NEUTROABS  --   --   --  3.1  --   --   --   HGB 12.9*   < > 12.9* 14.0 15.0 13.5 13.6  HCT 37.0*   < > 37.9* 40.8 44.0 38.8* 38.0*  MCV 90.9  --  91.5 90.1  --  89.6 89.6  PLT 272  --  233 308  --  301 360   < > = values in this interval not displayed.    Basic Metabolic Panel: Recent Labs  Lab 11/04/19 0206 11/04/19 0206 11/05/19 0316 11/09/19 1500 11/09/19 1551 11/09/19 1829 11/10/19 0414  NA 147*  --  139 141 142  --  140  K 3.7  --  4.1 3.8 3.9  --  4.1  CL 114*  --  109 102 101  --  102  CO2 23  --  21* 29  --   --  26  GLUCOSE 132*  --  127* 104* 108*  --  149*  BUN 15  --  17 6* 8  --  11  CREATININE 1.00   < > 0.93 0.71 0.70 0.93 0.95  CALCIUM 8.8*  --  8.6* 9.1  --   --  8.9  MG  --   --   --   --   --   --  2.0  PHOS  --   --   --   --   --   --  4.0   < > = values in this interval not displayed.   GFR: Estimated Creatinine Clearance: 83 mL/min (by C-G formula based on SCr of 0.95 mg/dL). Recent Labs  Lab 11/05/19 0316 11/09/19 1500 11/09/19 1829 11/10/19 0414  WBC 8.3 5.4 9.1 6.5    Liver Function Tests: Recent Labs  Lab 11/09/19 1500  AST 20  ALT 30  ALKPHOS 70  BILITOT 0.3  PROT 7.2  ALBUMIN 3.2*   No results for input(s): LIPASE, AMYLASE in the last 168 hours. No results for input(s): AMMONIA in the last 168 hours.  ABG    Component Value Date/Time   PHART 7.379 11/01/2019 1337   PCO2ART 50.2 (H) 11/01/2019 1337   PO2ART 309 (H) 11/01/2019 1337   HCO3 29.8 (H)  11/01/2019 1337   TCO2 30 11/09/2019 1551   O2SAT 100.0 11/01/2019 1337     Coagulation Profile: No results for input(s): INR, PROTIME in the last 168 hours.  Cardiac Enzymes: No results for input(s): CKTOTAL, CKMB, CKMBINDEX, TROPONINI in the last 168 hours.  HbA1C: Hemoglobin A1C  Date/Time Value Ref Range Status  05/05/2018 09:12 AM 5.9 (A) 4.0 - 5.6 % Final  10/10/2016 09:20 AM 6.1  Final   Hgb A1c MFr Bld  Date/Time Value Ref Range Status  10/18/2019 03:09 PM 5.9 (H) 4.8 - 5.6 % Final    Comment:             Prediabetes: 5.7 - 6.4          Diabetes: >6.4          Glycemic control for adults with diabetes: <7.0   03/05/2016 05:43 AM 5.9 (H) 4.8 - 5.6 % Final    Comment:    (NOTE)         Pre-diabetes: 5.7 - 6.4         Diabetes: >6.4         Glycemic control for adults with diabetes: <7.0     CBG: Recent Labs  Lab 11/04/19 2058 11/05/19 0641 11/05/19 1143 11/09/19 1814 11/10/19 0736  GLUCAP 114* 99 139* 153* 133*     care time 35 mins. This represents my time independent of the NPs time taking care of the pt. This is excluding procedures.    Briant Sites DO [facility] 11/10/2019, 9:07 AM

## 2019-11-10 NOTE — Progress Notes (Signed)
   Subjective:    Patient ID: Marco Pittman, male    DOB: Dec 21, 1960, 59 y.o.   MRN: [id number]  HPI Feeling good.  Breathing easily.  Review of Systems     Objective:   Physical Exam AF VSS Alert, NAD #8 cuffed Shiley trach in place, cuff deflated Not able to voice around occluded trach tube     Assessment & Plan:  Glottic stenosis s/p tracheostomy  Doing well, already transferred out of ICU.  Agree with SLP evaluation.  Looks like he won't tolerate a P-M valve yet.  OK to begin diet as able.  Plan total of five day stay in hospital.  Will down-size trach to #6 cuffless prior to discharge.  Will need discharge planning including trach teaching and suction machine for home.

## 2019-11-10 NOTE — Evaluation (Signed)
Passy-Muir Speaking Valve - Evaluation Patient Details  Name: Marco Pittman MRN: [id number] Date of Birth: 01-28-1961  Today's Date: 11/10/2019 Time: 1009-1016 SLP Time Calculation (min) (ACUTE ONLY): 7 min  Past Medical History:  Past Medical History:  Diagnosis Date  . Aneurysm (HCC)   . ETOH abuse   . Hypertension   . Pancreatitis 03/04/2016  . Umbilical hernia    symptomatic umbilical hernia/notes 03/04/2016   Past Surgical History:  Past Surgical History:  Procedure Laterality Date  . INSERTION OF MESH N/A 04/14/2016   Procedure: INSERTION OF MESH;  Surgeon: Avel Peace, MD;  Location: [facility];  Service: General;  Laterality: N/A;  . MICROLARYNGOSCOPY N/A 11/09/2019   Procedure: MICROLARYNGOSCOPY;  Surgeon: [name], MD;  Location: [facility] OR;  Service: ENT;  Laterality: N/A;  . TRACHEOSTOMY TUBE PLACEMENT N/A 11/09/2019   Procedure: AWAKE TRACHEOSTOMY;  Surgeon: [name], MD;  Location: [facility] OR;  Service: ENT;  Laterality: N/A;  . UMBILICAL HERNIA REPAIR N/A 04/14/2016   Procedure: UMBILICAL HERNIA REPAIR WITH MESH;  Surgeon: Avel Peace, MD;  Location: [facility];  Service: General;  Laterality: N/A;   HPI:  59 yo male smoker presented to ER 7/26 with worsening shortness of breath and chest discomfort. Found to have severe hypertension with SBP > 200.  CT chest showed b/l PE and RLL ASD with mucus plugging concerning for aspiration pneumonia. PMH: ETOH, HTN, Pancreatitis, Aneurysm. ETT 7/26-27; reintubated 7/27-29 due to angioedema. 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    Assessment / Plan / Recommendation Clinical Impression  Presently, pt is unable to wear Passy-Muir speaking valve for greater than 10 seconds. Air aggregates pushing valve from trach hub due to subglottic stenosis. Cuff was  deflated at baseline and he has copious secretions that he expels with assist from valve and or suction. Work of breathing and respiratory rate spikes into low 40's. No vocalization or whispers upon attempts at vocal output. ST will continue with trials using PMV.        SLP Visit Diagnosis: Aphonia (R49.1)    SLP Assessment  Patient needs continued Speech Lanaguage Pathology Services    Follow Up Recommendations  Other (comment) (TBD)    Frequency and Duration min 2x/week  2 weeks    PMSV Trial PMSV was placed for: 10 sec m;ax Able to redirect subglottic air through upper airway:  (yes but decreasd) Able to Attain Phonation: No Able to Expectorate Secretions: Yes Level of Secretion Expectoration with PMSV: Oral;Tracheal Intelligibility: Unable to assess (comment) Respirations During Trial:  (20-40) SpO2 During Trial:  (95-100) Pulse During Trial: 87 Behavior: Alert;Controlled;Cooperative;Good eye contact;Responsive to questions   Tracheostomy Tube       Vent Dependency  FiO2 (%): 35 %    Cuff Deflation Trial  GO Tolerated Cuff Deflation:  (deflated at rest) Behavior: Alert;Controlled;Cooperative;Good eye contact;Responsive to questions;Smiling        [name] 11/10/2019, 1:14 PM   [name].Ed Nurse, children's [phone] Office [phone]

## 2019-11-10 NOTE — Progress Notes (Signed)
CSW spoke with patient's sister [name] who states there are no advanced directives in place. [name] states the patient does not have a spouse and is not married - [name] to remain the patient's emergency contact. [name] did not have any further questions at this time.  Edwin Dada, MSW, LCSW-A Transitions of Care  Clinical Social Worker  [facility] Emergency Departments  Medical ICU [phone]

## 2019-11-10 NOTE — Evaluation (Signed)
Clinical/Bedside Swallow Evaluation Patient Details  Name: Marco Pittman MRN: [id number] Date of Birth: 1960-05-22  Today's Date: 11/10/2019 Time: SLP Start Time (ACUTE ONLY): 1009 SLP Stop Time (ACUTE ONLY): 1016 SLP Time Calculation (min) (ACUTE ONLY): 7 min  Past Medical History:  Past Medical History:  Diagnosis Date  . Aneurysm (HCC)   . ETOH abuse   . Hypertension   . Pancreatitis 03/04/2016  . Umbilical hernia    symptomatic umbilical hernia/notes 03/04/2016   Past Surgical History:  Past Surgical History:  Procedure Laterality Date  . INSERTION OF MESH N/A 04/14/2016   Procedure: INSERTION OF MESH;  Surgeon: Avel Peace, MD;  Location: [facility];  Service: General;  Laterality: N/A;  . MICROLARYNGOSCOPY N/A 11/09/2019   Procedure: MICROLARYNGOSCOPY;  Surgeon: [name], MD;  Location: [facility] OR;  Service: ENT;  Laterality: N/A;  . TRACHEOSTOMY TUBE PLACEMENT N/A 11/09/2019   Procedure: AWAKE TRACHEOSTOMY;  Surgeon: [name], MD;  Location: [facility] OR;  Service: ENT;  Laterality: N/A;  . UMBILICAL HERNIA REPAIR N/A 04/14/2016   Procedure: UMBILICAL HERNIA REPAIR WITH MESH;  Surgeon: Avel Peace, MD;  Location: [facility];  Service: General;  Laterality: N/A;   HPI:  59 yo male smoker presented to ER 7/26 with worsening shortness of breath and chest discomfort. Found to have severe hypertension with SBP > 200.  CT chest showed b/l PE and RLL ASD with mucus plugging concerning for aspiration pneumonia. PMH: ETOH, HTN, Pancreatitis, Aneurysm. ETT 7/26-27; reintubated 7/27-29 due to angioedema. 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 Seen 11/03/19 post extubation with regular and nectar thick recommended and upgraded to thin liquids the following day.     Assessment / Plan / Recommendation Clinical Impression   Swallow assessment completed without speaking valve due to intolerance given subglottic stenosis. Ice chips appeared to be tolerated as well as teaspoon size thin water. When volume was increased to cup size he had strong explosive cough after 5-8 seconds with mucous that was questionably thinned from water from trach on two occasions. No other consistencies were trialed. Recommend pt have ice chips after oral care -supervised, stoping if coughing becomes strong.     SLP Visit Diagnosis: Dysphagia, pharyngeal phase (R13.13)    Aspiration Risk  Moderate aspiration risk    Diet Recommendation NPO;Other (Comment);Ice chips PRN after oral care (except ice)        Other  Recommendations Oral Care Recommendations: Oral care QID   Follow up Recommendations Other (comment) (TBD)      Frequency and Duration min 2x/week  2 weeks       Prognosis Prognosis for Safe Diet Advancement: Good Barriers to Reach Goals: Other (Comment) (subglottic stenosis)      Swallow Study   General HPI: 59 yo male smoker presented to ER 7/26 with worsening shortness of breath and chest discomfort. Found to have severe hypertension with SBP > 200.  CT chest showed b/l PE and RLL ASD with mucus plugging concerning for aspiration pneumonia. PMH: ETOH, HTN, Pancreatitis, Aneurysm. ETT 7/26-27; reintubated 7/27-29 due to angioedema. 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 Seen 11/03/19 post extubation with regular and nectar thick recommended and upgraded to thin liquids the following day.   Type of Study:  Bedside Swallow Evaluation Previous Swallow Assessment:  (see HPI) Diet Prior to this Study: NPO Temperature Spikes Noted: No Respiratory Status: Trach;Trach Collar Trach Size and Type: #8;Cuff;Deflated;With PMSV not in place History of Recent Intubation: Yes Length of Intubations  (days): 3 days Date extubated: 11/03/19 Behavior/Cognition: Alert;Cooperative;Pleasant mood Oral Cavity Assessment: Within Functional Limits Oral Care Completed by SLP: No Oral Cavity - Dentition: Missing dentition;Adequate natural dentition Vision: Functional for self-feeding Self-Feeding Abilities: Able to feed self Patient Positioning: Upright in bed Baseline Vocal Quality: Other (comment) (trach, not tolerating valve) Volitional Cough: Weak Volitional Swallow: Able to elicit    Oral/Motor/Sensory Function Overall Oral Motor/Sensory Function: Within functional limits   Ice Chips Ice chips: Within functional limits Presentation: Spoon   Thin Liquid Thin Liquid: Impaired Presentation: Cup;Spoon Oral Phase Impairments:  (WFL) Pharyngeal  Phase Impairments: Cough - Delayed    Nectar Thick Nectar Thick Liquid: Not tested   Honey Thick Honey Thick Liquid: Not tested   Puree Puree: Not tested   Solid     Solid: Not tested      [name] 11/10/2019,1:41 PM  [name] [place].Ed Nurse, children's [phone] Office [phone]

## 2019-11-11 ENCOUNTER — Inpatient Hospital Stay (HOSPITAL_COMMUNITY): Payer: Medicaid Other

## 2019-11-11 DIAGNOSIS — J386 Stenosis of larynx: Secondary | ICD-10-CM | POA: Diagnosis not present

## 2019-11-11 LAB — GLUCOSE, CAPILLARY
Glucose-Capillary: 136 mg/dL — ABNORMAL HIGH (ref 70–99)
Glucose-Capillary: 122 mg/dL — ABNORMAL HIGH (ref 70–99)

## 2019-11-11 MED ORDER — ADULT MULTIVITAMIN LIQUID CH
15.0000 mL | Freq: Every day | ORAL | Status: DC
  Administered 2019-11-11 – 2019-12-10 (×27): 15 mL
  Filled 2019-11-11 (×30): qty 15

## 2019-11-11 MED ORDER — CARVEDILOL 3.125 MG PO TABS
3.1250 mg | ORAL_TABLET | Freq: Two times a day (BID) | ORAL | Status: DC
  Administered 2019-11-11: 3.125 mg via ORAL
  Filled 2019-11-11 (×2): qty 1

## 2019-11-11 MED ORDER — APIXABAN 5 MG PO TABS
5.0000 mg | ORAL_TABLET | Freq: Two times a day (BID) | ORAL | Status: DC
  Administered 2019-11-11 – 2019-11-13 (×5): 5 mg
  Filled 2019-11-11 (×4): qty 1

## 2019-11-11 MED ORDER — PROSOURCE TF PO LIQD
45.0000 mL | Freq: Two times a day (BID) | ORAL | Status: DC
  Administered 2019-11-11 – 2019-11-13 (×6): 45 mL
  Filled 2019-11-11 (×6): qty 45

## 2019-11-11 MED ORDER — LABETALOL HCL 5 MG/ML IV SOLN: 10.0000 mg | Freq: Four times a day (QID) | INTRAVENOUS | Status: DC | PRN

## 2019-11-11 MED ORDER — FOLIC ACID 1 MG PO TABS
1.0000 mg | ORAL_TABLET | Freq: Every day | ORAL | Status: DC
  Administered 2019-11-11 – 2019-11-13 (×3): 1 mg
  Filled 2019-11-11 (×3): qty 1

## 2019-11-11 MED ORDER — OSMOLITE 1.5 CAL PO LIQD
1000.0000 mL | ORAL | Status: DC
  Administered 2019-11-11 – 2019-11-12 (×3): 1000 mL
  Filled 2019-11-11 (×5): qty 1000

## 2019-11-11 MED ORDER — OSMOLITE 1.2 CAL PO LIQD
1000.0000 mL | ORAL | Status: DC
Start: 2019-11-11 — End: 2019-11-11

## 2019-11-11 MED ORDER — THIAMINE HCL 100 MG PO TABS
100.0000 mg | ORAL_TABLET | Freq: Every day | ORAL | Status: DC
  Administered 2019-11-11 – 2019-11-13 (×3): 100 mg
  Filled 2019-11-11 (×3): qty 1

## 2019-11-11 MED ORDER — FREE WATER
200.0000 mL | Freq: Four times a day (QID) | Status: DC
  Administered 2019-11-11 – 2019-11-14 (×11): 200 mL

## 2019-11-11 MED ORDER — APIXABAN 5 MG PO TABS
5.0000 mg | ORAL_TABLET | Freq: Two times a day (BID) | ORAL | Status: DC
  Filled 2019-11-11: qty 1

## 2019-11-11 NOTE — Progress Notes (Signed)
Initial Nutrition Assessment  DOCUMENTATION CODES:   Not applicable  INTERVENTION:  Initiate Osmolite 1.5 formula @ 20 ml/hr via Cortrak NGT and increase by 10 ml every 4 hours to goal rate of 60 ml/hr.   Provide 45 ml Prosource TF BID per tube.   Provide free water flushes of 200 ml q 6 hours.   Tube feeding regimen provides 2240 kcal (100% of needs), 112 grams of protein, and 1894 ml of H2O.   NUTRITION DIAGNOSIS:   Inadequate oral intake related to inability to eat as evidenced by NPO status.  GOAL:   Patient will meet greater than or equal to 90% of their needs  MONITOR:   Diet advancement, TF tolerance, Skin, Weight trends, Labs, I & O's  REASON FOR ASSESSMENT:   Consult Enteral/tube feeding initiation and management  ASSESSMENT:   59yo male who presents with chief complaint of shortness of breath, on evaluation was found to have subglottic stenosis. Pt underwent emergent awake tracheostomy 8/4.  Per MBS evaluation today, pt with pharyngeal and cervical esophageal dysphagia question of appearance of severe edema with gross aspiration without consistent sensation nor ability to clear. Diet recommendations for NPO. Cortrak NGT placed today with tip of tub in stomach. Per MD, plans for TF throughout the weekend and plan on repeat swallow evaluation Monday after trach change. RD to order tube feeding.   Pt reports prior to admission, he was eating well with usual consumption of at least 3 meals a day. Noted pt with a 7% weight loss in 1 month, which is significant for time frame.   NUTRITION - FOCUSED PHYSICAL EXAM:    Most Recent Value  Orbital Region Unable to assess  Upper Arm Region Mild depletion  Thoracic and Lumbar Region No depletion  Buccal Region Unable to assess  Temple Region Unable to assess  Clavicle Bone Region No depletion  Clavicle and Acromion Bone Region No depletion  Scapular Bone Region Unable to assess  Dorsal Hand Unable to assess  Patellar  Region No depletion  Anterior Thigh Region No depletion  Posterior Calf Region No depletion  Edema (RD Assessment) None  Hair Reviewed  Eyes Reviewed  Mouth Reviewed  Skin Reviewed  Nails Reviewed      Labs and medications reviewed.   Diet Order:   Diet Order            Diet NPO time specified  Diet effective now                 EDUCATION NEEDS:   Not appropriate for education at this time  Skin:  Skin Assessment: Skin Integrity Issues: Skin Integrity Issues:: Incisions Incisions: neck  Last BM:  8/4  Height:   Ht Readings from Last 1 Encounters:  11/10/19 6\' 1"  (1.854 m)    Weight:   Wt Readings from Last 1 Encounters:  11/11/19 74.3 kg   BMI:  Body mass index is 21.61 kg/m.  Estimated Nutritional Needs:   Kcal:  2200-2400  Protein:  110-120 grams  Fluid:  >/= 2 L/day  01/11/20, MS, RD, LDN RD pager number/after hours weekend pager number on Amion.

## 2019-11-11 NOTE — Progress Notes (Signed)
Modified Barium Swallow Progress Note  Patient Details  Name: Marco Pittman MRN: [id number] Date of Birth: 06-24-1960  Today's Date: 11/11/2019  Modified Barium Swallow completed.  Full report located under Chart Review in the Imaging Section.  Brief recommendations include the following:  Clinical Impression  Pt presents with gross pharyngeal and cervical esophageal dysphagia with question of appearance of severe edema with gross aspiration without consistent sensation nor ability to clear.  Minimal tongue base, pharyngeal contraction and laryngeal elevation results in aspiration during and after the swallow.  Minimal laryngeal closure and opening of cervical esophagus noted.  Thus barium enters minimally closed airway with swallow attempts and after as retention of barium and secretions spill into trachea.  HOB lowered to approx 45* did not improve pharyngeal clearance. Dry swallows were not effective to transit retention into pharynx as minimal movement was observed. Pt could not adequately expectorate all of the barium but when reflexively coughed, cough was strong.  Delayed cough noted - suspect due to deeper aspiration with some ability to clear with viscous secretions via trach. Pt was only provided a few tsps of liquids during testing due to severity of dysphagia/edema/aspiration. After completion of MBS, examination of trachea indicated secretions with barium retained.  Would recommend pt be npo x ice chips after oral care.  Question if large amount of dysphagia is due to edema which would improve prognosis for swallow recovery. Will follow up for treatment and education.  Using teach back, pt educated to findings/recommendation.  SLP Informed RN and MD of findings and spoke to MD re: this case.  Pt will require repeat MBS prior to other po administration due to level of dysphagia, sensorimotor deficts.     Swallow Evaluation Recommendations       SLP Diet Recommendations: NPO;Ice  chips PRN after oral care       Medication Administration: Via alternative means                    Rolena Infante, MS [facility] SLP Acute Rehab Services Office [phone]   Chales Abrahams 11/11/2019,11:22 AM

## 2019-11-11 NOTE — Progress Notes (Signed)
PROGRESS NOTE    Marco Pittman  ZOX:[id number] DOB: [birth year] DOA: 11/09/2019 PCP: Marcine Matar, MD      Brief Narrative:  [name] is a 59 y.o. M with HTN, recent angioedema due to ACEi requiring intubation, recent acute PE and aspiration pneumonia in July 2021 requiring re-intubation, as well as chronic alcohol use and hx pancreatitis who presented with wheezing/stridor.    ENT emergently consulted in the ER, performed bedside laryngoscopy that showed glottic stenosis and took the patient for tracheostomy.         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 [name] -SLP eval -Trach teaching -[facility] consult   Dysphagia Glottic edema? SLP noted very impaired swallow during MBS today.  They questioned glottic edema.  D/w ENT, who visualized the glottis yesterday and noted no edema.  ?whether he has any acute recurrence of angioedema or if icatabant is useful?  Much more likely, in my opinion and ENTs that this is self-limited, glottic dysfunction after trach placement, likely to quickly resolve. -Place NG tube -Start tube feeds -Re-attempt SLP eval on Monday  Hypertension BP elevated slightly -Resume carvedilol   -Hold amlodipine -As needed labetalol or hydralazine for severe range pressures  Pulmonary embolism -Stop Lovenox -Resume home apixaban  Alcohol use disorder No evidence of withdrawal -Start MVI, folate          Disposition: Status is: Inpatient  Remains inpatient appropriate because:unable to take any oral intake   Dispo: The patient is from: Home              Anticipated d/c is to: Home              Anticipated d/c date is: > 3 days              Patient currently is not medically stable to d/c.              MDM: The below labs and imaging reports were reviewed and summarized above.  Medication management as above.  Anticoagulation managed.   DVT prophylaxis:  apixaban (ELIQUIS) tablet  5 mg  Code Status: FULL Family Communication:     Consultants:   ENT  Pulm  Procedures:   8/4 Tracheostomy  8/6 MBS  Antimicrobials:      Culture data:              Subjective: Patient is feeling well.  He has no pain at the stoma, may be just a little irritation.  No significant secretions.  No headache, confusion, chest pain, dyspnea, trouble breathing.  Objective: Vitals:   11/11/19 0746 11/11/19 0817 11/11/19 1141 11/11/19 1542  BP: 136/85     Pulse: 88 93 88 88  Resp: 18 18 (!) 25 16  Temp: 98.1 F (36.7 C)     TempSrc: Oral     SpO2: 96% 95% 96% 99%  Weight:      Height:        Intake/Output Summary (Last 24 hours) at 11/11/2019 1546 Last data filed at 11/11/2019 0656 Gross per 24 hour  Intake 60 ml  Output 1350 ml  Net -1290 ml   Filed Weights   11/10/19 1237 11/11/19 0656  Weight: 74.2 kg 74.3 kg    Examination: General appearance:  adult male, alert and in no acute distress.   HEENT: Anicteric, conjunctiva pink, lids and lashes normal. No nasal deformity, discharge, epistaxis.  Lips moist, dentition in normal repair, oropharynx moist, no oral  lesions, tracheostomy with some pinkish secretions, slight oozing from fresh wound, otherwise appears well.   Skin: Warm and dry.  No jaundice.  No suspicious rashes or lesions. Cardiac: RRR, nl S1-S2, no murmurs appreciated.  Capillary refill is brisk.  JVP normal.  No LE edema.  Radial pulses 2+ and symmetric. Respiratory: Normal respiratory rate and rhythm.  CTAB without rales or wheezes. Abdomen: Abdomen soft.  No TTP or guarding. No ascites, distension, hepatosplenomegaly.   MSK: No deformities or effusions. Neuro: Awake and alert.  EOMI, moves all extremities. Speech fluent.    Psych: Sensorium intact and responding to questions, attention normal. Affect normal.  Judgment and insight appear normal.    Data Reviewed: I have personally reviewed following labs and imaging  studies:  CBC: Recent Labs  Lab 11/05/19 0316 11/09/19 1500 11/09/19 1551 11/09/19 1829 11/10/19 0414  WBC 8.3 5.4  --  9.1 6.5  NEUTROABS  --  3.1  --   --   --   HGB 12.9* 14.0 15.0 13.5 13.6  HCT 37.9* 40.8 44.0 38.8* 38.0*  MCV 91.5 90.1  --  89.6 89.6  PLT 233 308  --  301 360   Basic Metabolic Panel: Recent Labs  Lab 11/05/19 0316 11/09/19 1500 11/09/19 1551 11/09/19 1829 11/10/19 0414  NA 139 141 142  --  140  K 4.1 3.8 3.9  --  4.1  CL 109 102 101  --  102  CO2 21* 29  --   --  26  GLUCOSE 127* 104* 108*  --  149*  BUN 17 6* 8  --  11  CREATININE 0.93 0.71 0.70 0.93 0.95  CALCIUM 8.6* 9.1  --   --  8.9  MG  --   --   --   --  2.0  PHOS  --   --   --   --  4.0   GFR: Estimated Creatinine Clearance: 85.8 mL/min (by C-G formula based on SCr of 0.95 mg/dL). Liver Function Tests: Recent Labs  Lab 11/09/19 1500  AST 20  ALT 30  ALKPHOS 70  BILITOT 0.3  PROT 7.2  ALBUMIN 3.2*   No results for input(s): LIPASE, AMYLASE in the last 168 hours. No results for input(s): AMMONIA in the last 168 hours. Coagulation Profile: No results for input(s): INR, PROTIME in the last 168 hours. Cardiac Enzymes: No results for input(s): CKTOTAL, CKMB, CKMBINDEX, TROPONINI in the last 168 hours. BNP (last 3 results) No results for input(s): PROBNP in the last 8760 hours. HbA1C: No results for input(s): HGBA1C in the last 72 hours. CBG: Recent Labs  Lab 11/05/19 0641 11/05/19 1143 11/09/19 1814 11/10/19 0736 11/10/19 1131  GLUCAP 99 139* 153* 133* 119*   Lipid Profile: No results for input(s): CHOL, HDL, LDLCALC, TRIG, CHOLHDL, LDLDIRECT in the last 72 hours. Thyroid Function Tests: No results for input(s): TSH, T4TOTAL, FREET4, T3FREE, THYROIDAB in the last 72 hours. Anemia Panel: No results for input(s): VITAMINB12, FOLATE, FERRITIN, TIBC, IRON, RETICCTPCT in the last 72 hours. Urine analysis:    Component Value Date/Time   COLORURINE AMBER (A) 03/04/2016  1221   APPEARANCEUR HAZY (A) 03/04/2016 1221   APPEARANCEUR Cloudy (A) 10/06/2012 1452   LABSPEC 1.024 03/04/2016 1221   PHURINE 5.5 03/04/2016 1221   GLUCOSEU 100 (A) 03/04/2016 1221   HGBUR NEGATIVE 03/04/2016 1221   BILIRUBINUR SMALL (A) 03/04/2016 1221   BILIRUBINUR Negative 10/06/2012 1452   KETONESUR 15 (A) 03/04/2016 1221   PROTEINUR 30 (  A) 03/04/2016 1221   NITRITE POSITIVE (A) 03/04/2016 1221   LEUKOCYTESUR SMALL (A) 03/04/2016 1221   LEUKOCYTESUR 1+ (A) 10/06/2012 1452   Sepsis Labs: @LABRCNTIP (procalcitonin:4,lacticacidven:4)  ) Recent Results (from the past 240 hour(s))  SARS Coronavirus 2 by RT PCR (hospital order, performed in [facility] hospital lab) Nasopharyngeal Nasopharyngeal Swab     Status: None   Collection Time: 11/09/19  3:35 PM   Specimen: Nasopharyngeal Swab  Result Value Ref Range Status   SARS Coronavirus 2 NEGATIVE NEGATIVE Final    Comment: (NOTE) SARS-CoV-2 target nucleic acids are NOT DETECTED.  The SARS-CoV-2 RNA is generally detectable in upper and lower respiratory specimens during the acute phase of infection. The lowest concentration of SARS-CoV-2 viral copies this assay can detect is 250 copies / mL. A negative result does not preclude SARS-CoV-2 infection and should not be used as the sole basis for treatment or other patient management decisions.  A negative result may occur with improper specimen collection / handling, submission of specimen other than nasopharyngeal swab, presence of viral mutation(s) within the areas targeted by this assay, and inadequate number of viral copies (<250 copies / mL). A negative result must be combined with clinical observations, patient history, and epidemiological information.  Fact Sheet for Patients:   01/09/20  Fact Sheet for Healthcare Providers: BoilerBrush.com.cy  This test is not yet approved or  cleared by the https://pope.com/ FDA  and has been authorized for detection and/or diagnosis of SARS-CoV-2 by FDA under an Emergency Use Authorization (EUA).  This EUA will remain in effect (meaning this test can be used) for the duration of the COVID-19 declaration under Section 564(b)(1) of the Act, 21 U.S.C. section 360bbb-3(b)(1), unless the authorization is terminated or revoked sooner.  Performed at [facility], [address] [place], [place]          Radiology Studies: DG Swallowing Func-Speech Pathology  Result Date: 11/11/2019 Objective Swallowing Evaluation: Type of Study: MBS-Modified Barium Swallow Study  Patient Details Name: CHANE COWDEN MRN: Pollyann Savoy Date of Birth: July 12, 1960 Today's Date: 11/11/2019 Time: SLP Start Time (ACUTE ONLY): 0945 -SLP Stop Time (ACUTE ONLY): 1015 SLP Time Calculation (min) (ACUTE ONLY): 30 min Past Medical History: Past Medical History: Diagnosis Date . Aneurysm (HCC)  . ETOH abuse  . Hypertension  . Pancreatitis 03/04/2016 . Umbilical hernia   symptomatic umbilical hernia/notes 03/04/2016 Past Surgical History: Past Surgical History: Procedure Laterality Date . INSERTION OF MESH N/A 04/14/2016  Procedure: INSERTION OF MESH;  Surgeon: 06/12/2016, MD;  Location: [facility];  Service: General;  Laterality: N/A; . MICROLARYNGOSCOPY N/A 11/09/2019  Procedure: MICROLARYNGOSCOPY;  Surgeon: 01/09/2020, MD;  Location: [facility] OR;  Service: ENT;  Laterality: N/A; . TRACHEOSTOMY TUBE PLACEMENT N/A 11/09/2019  Procedure: AWAKE TRACHEOSTOMY;  Surgeon: 01/09/2020, MD;  Location: [facility] OR;  Service: ENT;  Laterality: N/A; . UMBILICAL HERNIA REPAIR N/A 04/14/2016  Procedure: UMBILICAL HERNIA REPAIR WITH MESH;  Surgeon: 06/12/2016, MD;  Location: [facility];  Service: General;  Laterality: N/A; HPI: 59 yo male smoker presented to ER 7/26 with worsening shortness of breath and chest discomfort. Found to have severe hypertension with SBP > 200.  CT chest  showed b/l PE and RLL ASD with mucus plugging concerning for aspiration pneumonia. PMH: ETOH, HTN, Pancreatitis, Aneurysm. ETT 7/26-27; reintubated 7/27-29 due to angioedema. ENT fiberoptic exam revealing severe subglottic stenosis with "immobile vocal folds with only a slit of glottic space for  breathing" and underwent emergent trach 8/4 (#8 cuffed). Seen 11/03/19 post extubation with regular and nectar thick recommended and upgraded to thin liquids the following day.   Subjective: pt awake in bed Assessment / Plan / Recommendation CHL IP CLINICAL IMPRESSIONS 11/11/2019 Clinical Impression Pt presents with gross pharyngeal and cervical esophageal dysphagia with question of appearance of severe edema with gross aspiration without consistent sensation nor ability to clear.  Minimal tongue base, pharyngeal contraction and laryngeal elevation results in aspiration during and after the swallow.  Minimal laryngeal closure and opening of cervical esophagus noted.  Thus barium enters minimally closed airway with swallow attempts and after as retention of barium and secretions spill into trachea.  HOB lowered to approx 45* did not improve pharyngeal clearance. Dry swallows were not effective to transit retention into pharynx as minimal movement was observed. Pt could not adequately expectorate all of the barium but when reflexively coughed, cough was strong.  Delayed cough noted - suspect due to deeper aspiration with some ability to clear with viscous secretions via trach. Pt was only provided a few tsps of liquids during testing due to severity of dysphagia/edema/aspiration. After completion of MBS, examination of trachea indicated secretions with barium retained.  Would recommend pt be npo x ice chips after oral care.  Question if large amount of dysphagia could be secondary to edema which would improve prognosis for swallow recovery. Will follow up for treatment and education.  Using teach back, pt educated to  findings/recommendation.  SLP Informed RN and MD of findings and spoke to MD re: this case. SLP Visit Diagnosis Dysphagia, pharyngoesophageal phase (R13.14) Attention and concentration deficit following -- Frontal lobe and executive function deficit following -- Impact on safety and function Risk for inadequate nutrition/hydration;Severe aspiration risk   CHL IP TREATMENT RECOMMENDATION 11/10/2019 Treatment Recommendations Therapy as outlined in treatment plan below   Prognosis 11/11/2019 Prognosis for Safe Diet Advancement Fair Barriers to Reach Goals -- Barriers/Prognosis Comment -- CHL IP DIET RECOMMENDATION 11/11/2019 SLP Diet Recommendations NPO;Ice chips PRN after oral care Liquid Administration via -- Medication Administration Via alternative means Compensations -- Postural Changes --             CHL IP FOLLOW UP RECOMMENDATIONS 11/11/2019 Follow up Recommendations Other (comment)   CHL IP FREQUENCY AND DURATION 11/11/2019 Speech Therapy Frequency (ACUTE ONLY) min 2x/week Treatment Duration 2 weeks      CHL IP ORAL PHASE 11/11/2019 Oral Phase WFL Oral - Pudding Teaspoon -- Oral - Pudding Cup -- Oral - Honey Teaspoon -- Oral - Honey Cup -- Oral - Nectar Teaspoon WFL;Premature spillage Oral - Nectar Cup -- Oral - Nectar Straw -- Oral - Thin Teaspoon WFL Oral - Thin Cup -- Oral - Thin Straw -- Oral - Puree -- Oral - Mech Soft -- Oral - Regular -- Oral - Multi-Consistency -- Oral - Pill -- Oral Phase - Comment --  CHL IP PHARYNGEAL PHASE 11/11/2019 Pharyngeal Phase Impaired Pharyngeal- Pudding Teaspoon -- Pharyngeal -- Pharyngeal- Pudding Cup -- Pharyngeal -- Pharyngeal- Honey Teaspoon -- Pharyngeal -- Pharyngeal- Honey Cup -- Pharyngeal -- Pharyngeal- Nectar Teaspoon Reduced pharyngeal peristalsis;Reduced epiglottic inversion;Reduced anterior laryngeal mobility;Reduced laryngeal elevation;Reduced airway/laryngeal closure;Reduced tongue base retraction;Penetration/Aspiration before swallow;Penetration/Aspiration during  swallow;Penetration/Apiration after swallow;Significant aspiration (Amount);Pharyngeal residue - valleculae Pharyngeal Material enters airway, passes BELOW cords without attempt by patient to eject out (silent aspiration) Pharyngeal- Nectar Cup -- Pharyngeal -- Pharyngeal- Nectar Straw -- Pharyngeal -- Pharyngeal- Thin Teaspoon Reduced pharyngeal peristalsis;Reduced epiglottic inversion;Reduced anterior laryngeal mobility;Reduced laryngeal  elevation;Significant aspiration (Amount);Pharyngeal residue - valleculae;Reduced airway/laryngeal closure;Penetration/Apiration after swallow;Reduced tongue base retraction;Penetration/Aspiration before swallow Pharyngeal Material enters airway, passes BELOW cords without attempt by patient to eject out (silent aspiration) Pharyngeal- Thin Cup -- Pharyngeal -- Pharyngeal- Thin Straw -- Pharyngeal -- Pharyngeal- Puree -- Pharyngeal -- Pharyngeal- Mechanical Soft -- Pharyngeal -- Pharyngeal- Regular -- Pharyngeal -- Pharyngeal- Multi-consistency -- Pharyngeal -- Pharyngeal- Pill -- Pharyngeal -- Pharyngeal Comment HOB lowered to approximately 45* did not improve transit into esophagus, cued dry swallows not helpful - minimal muscular contraction, cued cough did NOT clear aspirates, pt has no sensation to gross aspiration  CHL IP CERVICAL ESOPHAGEAL PHASE 11/11/2019 Cervical Esophageal Phase Impaired Pudding Teaspoon -- Pudding Cup -- Honey Teaspoon -- Honey Cup -- Nectar Teaspoon -- Nectar Cup -- Nectar Straw -- Thin Teaspoon -- Thin Cup -- Thin Straw -- Puree -- Mechanical Soft -- Regular -- Multi-consistency -- Pill -- Cervical Esophageal Comment ???  appearance of gross edema negatively impacting transit of boluses into esophagus, pyriform sinus retention mixed with secretions spills into open airway after the swallow Rolena Infanteammy K, MS [facility] SLP Acute Rehab Services Office [phone] Chales AbrahamsKimball, Tamara Ann 11/11/2019, 11:23 AM                   Scheduled Meds: . apixaban  5 mg Oral  BID  . carvedilol  3.125 mg Oral BID WC  . Chlorhexidine Gluconate Cloth  6 each Topical Daily  . feeding supplement (PROSource TF)  45 mL Per Tube BID  . folic acid  1 mg Per Tube Daily  . free water  200 mL Per Tube Q6H  . multivitamin  15 mL Per Tube Daily  . thiamine  100 mg Per Tube Daily   Continuous Infusions: . feeding supplement (OSMOLITE 1.5 CAL)       LOS: 2 days    Time spent: 35 minutes    Alberteen Samhristopher P Brixton Franko, MD [facility] 11/11/2019, 3:46 PM     Please page though AMION or Epic secure chat:  For [employer] Corporation[employer] password, Higher education careers advisercontact charge nurse

## 2019-11-11 NOTE — Progress Notes (Addendum)
°  Speech Language Pathology Treatment: Dysphagia  Patient Details Name: Marco Pittman MRN: [id number] DOB: 10-09-1960 Today's Date: 11/11/2019 Time: 0730-0752 SLP Time Calculation (min) (ACUTE ONLY): 22 min  Assessment / Plan / Recommendation Clinical Impression  Pt educated to importance of oral care, provided him with separate suction for oral vs trach to prevent cross contamination, and had pt brush his teeth and expectorate.  He needed mod I cues to indicate reasoning for oral care.  Given his articulation is great, he is ale to perform this task.  Pt continues to desire to eat/drink, Copious viscous yellow secretions coughed out of trachea.  Observed pt consuming ice chips x6, oral manipulation adequate and delayed coughing noted after first 2 ice chips with tracheal expectoration of secretions.  When requested to volitionally cough - pt winces as if to indicate discomfort.  However reflexive cough is strong, productive and does not elicit pain therefore hopefully pt will manage po diet after MBS today.    Did not conduct PMSV trials today due to decreased tolerance yesterday from glottic narrowing and ? impact of large trach *8.  Hopefully will tolerate with decreased edema and possibly downsized trach.  Will provide pt with communication board for his emergent use.  Pt agreeable to plan for MBS.    HPI HPI: 59 yo male smoker presented to ER 7/26 with worsening shortness of breath and chest discomfort. Found to have severe hypertension with SBP > 200.  CT chest showed b/l PE and RLL ASD with mucus plugging concerning for aspiration pneumonia. PMH: ETOH, HTN, Pancreatitis, Aneurysm. ETT 7/26-27; reintubated 7/27-29 due to angioedema. 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 Seen 11/03/19 post extubation with regular and nectar thick recommended and upgraded to  thin liquids the following day.        SLP Plan  MBS       Recommendations  Diet recommendations: Other(comment) (ice chips pending mbs) Compensations: Small sips/bites Postural Changes and/or Swallow Maneuvers: Seated upright 90 degrees                Oral Care Recommendations: Oral care QID Follow up Recommendations: Other (comment) (TBD) SLP Visit Diagnosis: Dysphagia, pharyngeal phase (R13.13) Plan: MBS       GO                Chales Abrahams 11/11/2019, 9:07 AM   Rolena Infante, MS [facility] SLP Acute Rehab Services Office [phone] pager

## 2019-11-11 NOTE — Procedures (Signed)
Cortrak  Person Inserting Tube:  Adnan Vanvoorhis, RD Tube Type:  Cortrak - 43 inches Tube Location:  Right nare Initial Placement:  Stomach Secured by: Bridle Technique Used to Measure Tube Placement:  Documented cm marking at nare/ corner of mouth Cortrak Secured At:  65 cm   Cortrak Tube Team Note:  Consult received to place a Cortrak feeding tube.   No x-ray is required. RN may begin using tube.   If the tube becomes dislodged please keep the tube and contact the Cortrak team at [email or web address] (password [email or web address]) for replacement.  If after hours and replacement cannot be delayed, place a NG tube and confirm placement with an abdominal x-ray.    Vanessa Kick RD, LDN Clinical Nutrition Pager listed in AMION

## 2019-11-11 NOTE — Progress Notes (Addendum)
   Subjective:    Patient ID: Marco Pittman, male    DOB: [birth year], 59 y.o.   MRN: [id number]  HPI Breathing easily.  Feeling good.  Just had a swallow evaluation with poor result.  Review of Systems     Objective:   Physical Exam AF VSS Alert, NAD #8 cuffed Shiley trach in place, cuff deflated     Assessment & Plan:  Glottic stenosis s/p tracheostomy  Trach functioning well.  Plan trach change Monday to #6 cuffless Shiley.  Passy-Muir valve will likely work at that point.  I spoke to his attending about his swallow evaluation.  I recommended proceeding with nasogastric feeding tube placement to provide nutrition through the weekend and plan repeat swallow evaluation Monday, after the trach change.  His laryngeal findings should not impact significantly on swallow.  Perhaps this is related to his fresh tracheostomy.

## 2019-11-12 DIAGNOSIS — J386 Stenosis of larynx: Secondary | ICD-10-CM | POA: Diagnosis not present

## 2019-11-12 LAB — GLUCOSE, CAPILLARY
Glucose-Capillary: 189 mg/dL — ABNORMAL HIGH (ref 70–99)
Glucose-Capillary: 192 mg/dL — ABNORMAL HIGH (ref 70–99)
Glucose-Capillary: 131 mg/dL — ABNORMAL HIGH (ref 70–99)
Glucose-Capillary: 101 mg/dL — ABNORMAL HIGH (ref 70–99)

## 2019-11-12 MED ORDER — ENOXAPARIN SODIUM 40 MG/0.4ML ~~LOC~~ SOLN: 40.0000 mg | SUBCUTANEOUS | Status: DC

## 2019-11-12 MED ORDER — GLYCOPYRROLATE 0.2 MG/ML IJ SOLN
0.1000 mg | Freq: Once | INTRAMUSCULAR | Status: AC | PRN
  Administered 2019-11-13: 0.1 mg via INTRAVENOUS
  Filled 2019-11-12: qty 1

## 2019-11-12 MED ORDER — CARVEDILOL 3.125 MG PO TABS
3.1250 mg | ORAL_TABLET | Freq: Two times a day (BID) | ORAL | Status: DC
  Administered 2019-11-12 (×2): 3.125 mg
  Filled 2019-11-12 (×2): qty 1

## 2019-11-12 MED ORDER — CARVEDILOL 3.125 MG PO TABS
3.1250 mg | ORAL_TABLET | Freq: Two times a day (BID) | ORAL | Status: DC
  Administered 2019-11-13 (×2): 3.125 mg
  Filled 2019-11-12 (×2): qty 1

## 2019-11-12 MED ORDER — GLYCOPYRROLATE 0.2 MG/ML IJ SOLN: 0.1000 mg | Freq: Once | INTRAMUSCULAR | Status: DC

## 2019-11-12 NOTE — Progress Notes (Signed)
PROGRESS NOTE    OBE Marco Pittman  ZOX:[id number] DOB: 1960/05/10 DOA: 11/09/2019 PCP: Marcine Matar, MD      Brief Narrative:  [name] is a 59 y.o. M with HTN, recent angioedema due to ACEi requiring intubation, recent acute PE and aspiration pneumonia in July 2021 requiring re-intubation, as well as chronic alcohol use and hx pancreatitis who presented with wheezing/stridor.    ENT emergently consulted in the ER, performed bedside laryngoscopy that showed glottic stenosis and took the patient for tracheostomy.         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 [name] -SLP eval -Trach teaching -[facility] consult   Dysphagia Hopefully this is temporary, core track placed yesterday -Continue tube feeds -Re-attempt SLP eval on Monday  Hypertension BP controlled -Continue carvedilol -Hold amlodipine -As needed labetalol or hydralazine for severe range pressures  Pulmonary embolism -Continue apixaban  Alcohol use disorder Withdrawals -Continue MVI, folate          Disposition: Status is: Inpatient  Remains inpatient appropriate because:unable to take any oral intake   Dispo: The patient is from: Home              Anticipated d/c is to: Home              Anticipated d/c date is: > 3 days              Patient currently is not medically stable to d/c.              MDM: The below labs and imaging reports were reviewed and summarized above.  Medication management as above.  Anticoagulation managed.   DVT prophylaxis:  apixaban (ELIQUIS) tablet 5 mg  Code Status: FULL Family Communication:     Consultants:   ENT  Pulm  Procedures:   8/4 Tracheostomy  8/6 MBS  Antimicrobials:      Culture data:              Subjective: Patient has no complaints.  He has some secretions from his tracheostomy, but these are within normal limits, no fever, confusion, chest  pain..  Objective: Vitals:   11/12/19 0630 11/12/19 0747 11/12/19 0822 11/12/19 1136  BP: 122/76 (!) 133/91    Pulse: 94 88 85 89  Resp:  18 18 19   Temp:  98.3 F (36.8 C)    TempSrc:  Oral    SpO2: 98% 98% 96% 97%  Weight: 74.3 kg     Height:        Intake/Output Summary (Last 24 hours) at 11/12/2019 1458 Last data filed at 11/12/2019 0303 Gross per 24 hour  Intake 663 ml  Output 975 ml  Net -312 ml   Filed Weights   11/10/19 1237 11/11/19 0656 11/12/19 0630  Weight: 74.2 kg 74.3 kg 74.3 kg    Examination: General appearance: Well-nourished adult male, lying in bed, interactive, watching TV     HEENT: Tracheostomy appears normal, clear secretions, expected Skin:  Cardiac: RRR, no murmurs, no lower extremity edema Respiratory: Normal respiratory rate and rhythm, lungs clear without rales or wheezes Abdomen: Abdomen soft no tenderness palpation or guarding, no ascites or distention MSK:  Neuro: Awake and alert, extraocular movements intact, moves all extremities with normal strength and coordination Psych: Sensorium intact responding questions, attention normal, affect pleasant, judgment insight appear normal    Data Reviewed: I have personally reviewed following labs and imaging studies:  CBC: Recent Labs  Lab 11/09/19  1500 11/09/19 1551 11/09/19 1829 11/10/19 0414  WBC 5.4  --  9.1 6.5  NEUTROABS 3.1  --   --   --   HGB 14.0 15.0 13.5 13.6  HCT 40.8 44.0 38.8* 38.0*  MCV 90.1  --  89.6 89.6  PLT 308  --  301 360   Basic Metabolic Panel: Recent Labs  Lab 11/09/19 1500 11/09/19 1551 11/09/19 1829 11/10/19 0414  NA 141 142  --  140  K 3.8 3.9  --  4.1  CL 102 101  --  102  CO2 29  --   --  26  GLUCOSE 104* 108*  --  149*  BUN 6* 8  --  11  CREATININE 0.71 0.70 0.93 0.95  CALCIUM 9.1  --   --  8.9  MG  --   --   --  2.0  PHOS  --   --   --  4.0   GFR: Estimated Creatinine Clearance: 85.8 mL/min (by C-G formula based on SCr of 0.95 mg/dL). Liver  Function Tests: Recent Labs  Lab 11/09/19 1500  AST 20  ALT 30  ALKPHOS 70  BILITOT 0.3  PROT 7.2  ALBUMIN 3.2*   No results for input(s): LIPASE, AMYLASE in the last 168 hours. No results for input(s): AMMONIA in the last 168 hours. Coagulation Profile: No results for input(s): INR, PROTIME in the last 168 hours. Cardiac Enzymes: No results for input(s): CKTOTAL, CKMB, CKMBINDEX, TROPONINI in the last 168 hours. BNP (last 3 results) No results for input(s): PROBNP in the last 8760 hours. HbA1C: No results for input(s): HGBA1C in the last 72 hours. CBG: Recent Labs  Lab 11/11/19 1947 11/11/19 2300 11/12/19 0355 11/12/19 0744 11/12/19 1200  GLUCAP 136* 122* 189* 192* 131*   Lipid Profile: No results for input(s): CHOL, HDL, LDLCALC, TRIG, CHOLHDL, LDLDIRECT in the last 72 hours. Thyroid Function Tests: No results for input(s): TSH, T4TOTAL, FREET4, T3FREE, THYROIDAB in the last 72 hours. Anemia Panel: No results for input(s): VITAMINB12, FOLATE, FERRITIN, TIBC, IRON, RETICCTPCT in the last 72 hours. Urine analysis:    Component Value Date/Time   COLORURINE AMBER (A) 03/04/2016 1221   APPEARANCEUR HAZY (A) 03/04/2016 1221   APPEARANCEUR Cloudy (A) 10/06/2012 1452   LABSPEC 1.024 03/04/2016 1221   PHURINE 5.5 03/04/2016 1221   GLUCOSEU 100 (A) 03/04/2016 1221   HGBUR NEGATIVE 03/04/2016 1221   BILIRUBINUR SMALL (A) 03/04/2016 1221   BILIRUBINUR Negative 10/06/2012 1452   KETONESUR 15 (A) 03/04/2016 1221   PROTEINUR 30 (A) 03/04/2016 1221   NITRITE POSITIVE (A) 03/04/2016 1221   LEUKOCYTESUR SMALL (A) 03/04/2016 1221   LEUKOCYTESUR 1+ (A) 10/06/2012 1452   Sepsis Labs: @LABRCNTIP (procalcitonin:4,lacticacidven:4)  ) Recent Results (from the past 240 hour(s))  SARS Coronavirus 2 by RT PCR (hospital order, performed in [facility] hospital lab) Nasopharyngeal Nasopharyngeal Swab     Status: None   Collection Time: 11/09/19  3:35 PM   Specimen: Nasopharyngeal  Swab  Result Value Ref Range Status   SARS Coronavirus 2 NEGATIVE NEGATIVE Final    Comment: (NOTE) SARS-CoV-2 target nucleic acids are NOT DETECTED.  The SARS-CoV-2 RNA is generally detectable in upper and lower respiratory specimens during the acute phase of infection. The lowest concentration of SARS-CoV-2 viral copies this assay can detect is 250 copies / mL. A negative result does not preclude SARS-CoV-2 infection and should not be used as the sole basis for treatment or other patient management decisions.  A negative  result may occur with improper specimen collection / handling, submission of specimen other than nasopharyngeal swab, presence of viral mutation(s) within the areas targeted by this assay, and inadequate number of viral copies (<250 copies / mL). A negative result must be combined with clinical observations, patient history, and epidemiological information.  Fact Sheet for Patients:   BoilerBrush.com.cy  Fact Sheet for Healthcare Providers: https://pope.com/  This test is not yet approved or  cleared by the [place] FDA and has been authorized for detection and/or diagnosis of SARS-CoV-2 by FDA under an Emergency Use Authorization (EUA).  This EUA will remain in effect (meaning this test can be used) for the duration of the COVID-19 declaration under Section 564(b)(1) of the Act, 21 U.S.C. section 360bbb-3(b)(1), unless the authorization is terminated or revoked sooner.  Performed at [facility], [address] [place], [place] [address]          Radiology Studies: DG Swallowing Func-Speech Pathology  Result Date: 11/11/2019 Objective Swallowing Evaluation: Type of Study: MBS-Modified Barium Swallow Study  Patient Details Name: Marco Pittman MRN: [id number] Date of Birth: Jul 28, 1960 Today's Date: 11/11/2019 Time: SLP Start Time (ACUTE ONLY): 0945 -SLP Stop Time (ACUTE ONLY): 1015 SLP Time  Calculation (min) (ACUTE ONLY): 30 min Past Medical History: Past Medical History: Diagnosis Date . Aneurysm (HCC)  . ETOH abuse  . Hypertension  . Pancreatitis 03/04/2016 . Umbilical hernia   symptomatic umbilical hernia/notes 03/04/2016 Past Surgical History: Past Surgical History: Procedure Laterality Date . INSERTION OF MESH N/A 04/14/2016  Procedure: INSERTION OF MESH;  Surgeon: Avel Peace, MD;  Location: [facility];  Service: General;  Laterality: N/A; . MICROLARYNGOSCOPY N/A 11/09/2019  Procedure: MICROLARYNGOSCOPY;  Surgeon: [name], MD;  Location: [facility] OR;  Service: ENT;  Laterality: N/A; . TRACHEOSTOMY TUBE PLACEMENT N/A 11/09/2019  Procedure: AWAKE TRACHEOSTOMY;  Surgeon: [name], MD;  Location: [facility] OR;  Service: ENT;  Laterality: N/A; . UMBILICAL HERNIA REPAIR N/A 04/14/2016  Procedure: UMBILICAL HERNIA REPAIR WITH MESH;  Surgeon: Avel Peace, MD;  Location: [facility];  Service: General;  Laterality: N/A; HPI: 59 yo male smoker presented to ER 7/26 with worsening shortness of breath and chest discomfort. Found to have severe hypertension with SBP > 200.  CT chest showed b/l PE and RLL ASD with mucus plugging concerning for aspiration pneumonia. PMH: ETOH, HTN, Pancreatitis, Aneurysm. ETT 7/26-27; reintubated 7/27-29 due to angioedema. 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 Seen 11/03/19 post extubation with regular and nectar thick recommended and upgraded to thin liquids the following day.   Subjective: pt awake in bed Assessment / Plan / Recommendation CHL IP CLINICAL IMPRESSIONS 11/11/2019 Clinical Impression Pt presents with gross pharyngeal and cervical esophageal dysphagia with question of appearance of severe edema with gross aspiration without consistent sensation nor ability to clear.  Minimal tongue base, pharyngeal  contraction and laryngeal elevation results in aspiration during and after the swallow.  Minimal laryngeal closure and opening of cervical esophagus noted.  Thus barium enters minimally closed airway with swallow attempts and after as retention of barium and secretions spill into trachea.  HOB lowered to approx 45* did not improve pharyngeal clearance. Dry swallows were not effective to transit retention into pharynx as minimal movement was observed. Pt could not adequately expectorate all of the barium but when reflexively coughed, cough was strong.  Delayed  cough noted - suspect due to deeper aspiration with some ability to clear with viscous secretions via trach. Pt was only provided a few tsps of liquids during testing due to severity of dysphagia/edema/aspiration. After completion of MBS, examination of trachea indicated secretions with barium retained.  Would recommend pt be npo x ice chips after oral care.  Question if large amount of dysphagia could be secondary to edema which would improve prognosis for swallow recovery. Will follow up for treatment and education.  Using teach back, pt educated to findings/recommendation.  SLP Informed RN and MD of findings and spoke to MD re: this case. SLP Visit Diagnosis Dysphagia, pharyngoesophageal phase (R13.14) Attention and concentration deficit following -- Frontal lobe and executive function deficit following -- Impact on safety and function Risk for inadequate nutrition/hydration;Severe aspiration risk   CHL IP TREATMENT RECOMMENDATION 11/10/2019 Treatment Recommendations Therapy as outlined in treatment plan below   Prognosis 11/11/2019 Prognosis for Safe Diet Advancement Fair Barriers to Reach Goals -- Barriers/Prognosis Comment -- CHL IP DIET RECOMMENDATION 11/11/2019 SLP Diet Recommendations NPO;Ice chips PRN after oral care Liquid Administration via -- Medication Administration Via alternative means Compensations -- Postural Changes --             CHL IP FOLLOW UP  RECOMMENDATIONS 11/11/2019 Follow up Recommendations Other (comment)   CHL IP FREQUENCY AND DURATION 11/11/2019 Speech Therapy Frequency (ACUTE ONLY) min 2x/week Treatment Duration 2 weeks      CHL IP ORAL PHASE 11/11/2019 Oral Phase WFL Oral - Pudding Teaspoon -- Oral - Pudding Cup -- Oral - Honey Teaspoon -- Oral - Honey Cup -- Oral - Nectar Teaspoon WFL;Premature spillage Oral - Nectar Cup -- Oral - Nectar Straw -- Oral - Thin Teaspoon WFL Oral - Thin Cup -- Oral - Thin Straw -- Oral - Puree -- Oral - Mech Soft -- Oral - Regular -- Oral - Multi-Consistency -- Oral - Pill -- Oral Phase - Comment --  CHL IP PHARYNGEAL PHASE 11/11/2019 Pharyngeal Phase Impaired Pharyngeal- Pudding Teaspoon -- Pharyngeal -- Pharyngeal- Pudding Cup -- Pharyngeal -- Pharyngeal- Honey Teaspoon -- Pharyngeal -- Pharyngeal- Honey Cup -- Pharyngeal -- Pharyngeal- Nectar Teaspoon Reduced pharyngeal peristalsis;Reduced epiglottic inversion;Reduced anterior laryngeal mobility;Reduced laryngeal elevation;Reduced airway/laryngeal closure;Reduced tongue base retraction;Penetration/Aspiration before swallow;Penetration/Aspiration during swallow;Penetration/Apiration after swallow;Significant aspiration (Amount);Pharyngeal residue - valleculae Pharyngeal Material enters airway, passes BELOW cords without attempt by patient to eject out (silent aspiration) Pharyngeal- Nectar Cup -- Pharyngeal -- Pharyngeal- Nectar Straw -- Pharyngeal -- Pharyngeal- Thin Teaspoon Reduced pharyngeal peristalsis;Reduced epiglottic inversion;Reduced anterior laryngeal mobility;Reduced laryngeal elevation;Significant aspiration (Amount);Pharyngeal residue - valleculae;Reduced airway/laryngeal closure;Penetration/Apiration after swallow;Reduced tongue base retraction;Penetration/Aspiration before swallow Pharyngeal Material enters airway, passes BELOW cords without attempt by patient to eject out (silent aspiration) Pharyngeal- Thin Cup -- Pharyngeal -- Pharyngeal- Thin Straw --  Pharyngeal -- Pharyngeal- Puree -- Pharyngeal -- Pharyngeal- Mechanical Soft -- Pharyngeal -- Pharyngeal- Regular -- Pharyngeal -- Pharyngeal- Multi-consistency -- Pharyngeal -- Pharyngeal- Pill -- Pharyngeal -- Pharyngeal Comment HOB lowered to approximately 45* did not improve transit into esophagus, cued dry swallows not helpful - minimal muscular contraction, cued cough did NOT clear aspirates, pt has no sensation to gross aspiration  CHL IP CERVICAL ESOPHAGEAL PHASE 11/11/2019 Cervical Esophageal Phase Impaired Pudding Teaspoon -- Pudding Cup -- Honey Teaspoon -- Honey Cup -- Nectar Teaspoon -- Nectar Cup -- Nectar Straw -- Thin Teaspoon -- Thin Cup -- Thin Straw -- Puree -- Mechanical Soft -- Regular -- Multi-consistency -- Pill -- Cervical Esophageal Comment ???  appearance of  gross edema negatively impacting transit of boluses into esophagus, pyriform sinus retention mixed with secretions spills into open airway after the swallow Rolena Infante, MS [facility] SLP Acute Rehab Services Office [phone] Chales Abrahams 11/11/2019, 11:23 AM                   Scheduled Meds: . apixaban  5 mg Per Tube BID  . carvedilol  3.125 mg Per Tube BID WC  . Chlorhexidine Gluconate Cloth  6 each Topical Daily  . feeding supplement (PROSource TF)  45 mL Per Tube BID  . folic acid  1 mg Per Tube Daily  . free water  200 mL Per Tube Q6H  . multivitamin  15 mL Per Tube Daily  . thiamine  100 mg Per Tube Daily   Continuous Infusions: . feeding supplement (OSMOLITE 1.5 CAL) 1,000 mL (11/12/19 1001)     LOS: 3 days    Time spent: 25 minutes    Alberteen Sam, MD [facility] 11/12/2019, 2:58 PM     Please page though AMION or Epic secure chat:  For [employer], Higher education careers adviser

## 2019-11-13 DIAGNOSIS — J386 Stenosis of larynx: Secondary | ICD-10-CM | POA: Diagnosis not present

## 2019-11-13 LAB — GLUCOSE, CAPILLARY
Glucose-Capillary: 151 mg/dL — ABNORMAL HIGH (ref 70–99)
Glucose-Capillary: 156 mg/dL — ABNORMAL HIGH (ref 70–99)
Glucose-Capillary: 114 mg/dL — ABNORMAL HIGH (ref 70–99)
Glucose-Capillary: 100 mg/dL — ABNORMAL HIGH (ref 70–99)
Glucose-Capillary: 140 mg/dL — ABNORMAL HIGH (ref 70–99)
Glucose-Capillary: 126 mg/dL — ABNORMAL HIGH (ref 70–99)

## 2019-11-13 MED ORDER — SENNOSIDES-DOCUSATE SODIUM 8.6-50 MG PO TABS
1.0000 | ORAL_TABLET | Freq: Every day | ORAL | Status: DC
  Filled 2019-11-13: qty 1

## 2019-11-13 MED ORDER — SENNOSIDES 8.8 MG/5ML PO SYRP
5.0000 mL | ORAL_SOLUTION | Freq: Every day | ORAL | Status: DC
  Administered 2019-11-13 – 2019-12-07 (×3): 5 mL
  Filled 2019-11-13 (×28): qty 5

## 2019-11-13 NOTE — Plan of Care (Signed)
Discussed earlier in the shift with the patient plan of care for the evening, trach suctioning and possibly getting something to dry up his secretions with some teach back displayed.

## 2019-11-13 NOTE — Progress Notes (Signed)
PROGRESS NOTE    Marco Pittman  ZJQ:[id number] DOB: 1961/02/06 DOA: 11/09/2019 PCP: Marcine Matar, MD      Brief Narrative:  Marco Pittman is a 59 y.o. M with HTN, recent angioedema due to ACEi requiring intubation, recent acute PE and aspiration pneumonia in July 2021 requiring re-intubation, as well as chronic alcohol use and hx pancreatitis who presented with wheezing/stridor.    ENT emergently consulted in the ER, performed bedside laryngoscopy that showed glottic stenosis and took the patient for tracheostomy.         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 [name] Had recently undergone multiple intubations and extubations for: -Angioedema with intubation 7/15 7/18 -Aspiration pneumonia and pulmonary embolism, intubated 7/26-7/27 -Reintubated the same hospitalization due to upper airway wheezing, requiring reintubation 7/27-7/29  Now patient presented with shortness of breath, stridor for a few days prior to admission.  ENT consulted in the ER took for emergent tracheostomy.  -Trach teaching -TOC consult    Dysphagia Post-tracheostomy, patient failed swallow eval by SLP and MBS showed extensive aspiration.    Hopefully this is temporary, NG tube placed for nutrition and meds, will re-attempt SLP eval on Monday -Continue tube feeds  -If SLP eval successful on Monday, will attempt to advance diet, complete trach teaching, and discharge likely Tuesday -If SLP eval fails on Monday, and SLP and ENT do not feel that he will be able to advance his diet in a reasonably short amount of time, will need to place PEG tube, and continue speech therapy as an outpatient    Hypertension BP normal -Continue carvedilol -Hold amlodipine -As needed labetalol or hydralazine for severe range pressures  Recent pulmonary embolism -Continue apixaban  Alcohol use disorder No evidence of withdrawal -Continue MVI, folate           Disposition: Status is: Inpatient  Remains inpatient appropriate because:unable to take any oral intake   Dispo: The patient is from: Home              Anticipated d/c is to: Home              Anticipated d/c date is: > 3 days              Patient currently is not medically stable to d/c.              MDM: The below labs and imaging reports were reviewed and summarized above.  Medication management as above.  Anticoagulation managed.   DVT prophylaxis:  apixaban (ELIQUIS) tablet 5 mg  Code Status: FULL Family Communication:     Consultants:   ENT  Pulm  Procedures:   8/4 Tracheostomy  8/6 MBS  Antimicrobials:      Culture data:              Subjective: No new complaints, no cough.  Secretions from trach are within normal limits.  No fever, no confusion.  No chest discomfort.  Objective: Vitals:   11/13/19 0747 11/13/19 1139 11/13/19 1507 11/13/19 1525  BP:    122/79  Pulse: 88 76 82 75  Resp: 17 18 16 16   Temp:    98.9 F (37.2 C)  TempSrc:    Oral  SpO2: 98% 98% 99% 100%  Weight:      Height:        Intake/Output Summary (Last 24 hours) at 11/13/2019 1555 Last data filed at 11/13/2019 0900 Gross per 24 hour  Intake  2215 ml  Output 1340 ml  Net 875 ml   Filed Weights   11/11/19 0656 11/12/19 0630 11/13/19 0403  Weight: 74.3 kg 74.3 kg 74.1 kg    Examination: General appearance: Well-nourished adult male, lying in bed, watching television     HEENT: Tracheostomy with some clear secretions, otherwise unremarkable postoperative, oropharynx moist, no oral lesions, hearing normal Skin:  Cardiac: RRR, no murmurs, no lower extremity edema Respiratory: Normal respiratory rate and rhythm, lungs clear without rales or wheezes Abdomen: Abdomen soft tenderness palpation or guarding, no ascites or distention MSK:  Neuro: Awake and alert, extraocular movements intact, moves all extremities with normal strength and  coordination Psych: Sensorium intact responding to questions, attention normal, affect normal, judgment insight appear normal    Data Reviewed: I have personally reviewed following labs and imaging studies:  CBC: Recent Labs  Lab 11/09/19 1500 11/09/19 1551 11/09/19 1829 11/10/19 0414  WBC 5.4  --  9.1 6.5  NEUTROABS 3.1  --   --   --   HGB 14.0 15.0 13.5 13.6  HCT 40.8 44.0 38.8* 38.0*  MCV 90.1  --  89.6 89.6  PLT 308  --  301 360   Basic Metabolic Panel: Recent Labs  Lab 11/09/19 1500 11/09/19 1551 11/09/19 1829 11/10/19 0414  NA 141 142  --  140  K 3.8 3.9  --  4.1  CL 102 101  --  102  CO2 29  --   --  26  GLUCOSE 104* 108*  --  149*  BUN 6* 8  --  11  CREATININE 0.71 0.70 0.93 0.95  CALCIUM 9.1  --   --  8.9  MG  --   --   --  2.0  PHOS  --   --   --  4.0   GFR: Estimated Creatinine Clearance: 85.6 mL/min (by C-G formula based on SCr of 0.95 mg/dL). Liver Function Tests: Recent Labs  Lab 11/09/19 1500  AST 20  ALT 30  ALKPHOS 70  BILITOT 0.3  PROT 7.2  ALBUMIN 3.2*   No results for input(s): LIPASE, AMYLASE in the last 168 hours. No results for input(s): AMMONIA in the last 168 hours. Coagulation Profile: No results for input(s): INR, PROTIME in the last 168 hours. Cardiac Enzymes: No results for input(s): CKTOTAL, CKMB, CKMBINDEX, TROPONINI in the last 168 hours. BNP (last 3 results) No results for input(s): PROBNP in the last 8760 hours. HbA1C: No results for input(s): HGBA1C in the last 72 hours. CBG: Recent Labs  Lab 11/12/19 1617 11/13/19 0405 11/13/19 0726 11/13/19 1145 11/13/19 1523  GLUCAP 101* 151* 156* 114* 100*   Lipid Profile: No results for input(s): CHOL, HDL, LDLCALC, TRIG, CHOLHDL, LDLDIRECT in the last 72 hours. Thyroid Function Tests: No results for input(s): TSH, T4TOTAL, FREET4, T3FREE, THYROIDAB in the last 72 hours. Anemia Panel: No results for input(s): VITAMINB12, FOLATE, FERRITIN, TIBC, IRON, RETICCTPCT in the  last 72 hours. Urine analysis:    Component Value Date/Time   COLORURINE AMBER (A) 03/04/2016 1221   APPEARANCEUR HAZY (A) 03/04/2016 1221   APPEARANCEUR Cloudy (A) 10/06/2012 1452   LABSPEC 1.024 03/04/2016 1221   PHURINE 5.5 03/04/2016 1221   GLUCOSEU 100 (A) 03/04/2016 1221   HGBUR NEGATIVE 03/04/2016 1221   BILIRUBINUR SMALL (A) 03/04/2016 1221   BILIRUBINUR Negative 10/06/2012 1452   KETONESUR 15 (A) 03/04/2016 1221   PROTEINUR 30 (A) 03/04/2016 1221   NITRITE POSITIVE (A) 03/04/2016 1221   LEUKOCYTESUR SMALL (  A) 03/04/2016 1221   LEUKOCYTESUR 1+ (A) 10/06/2012 1452   Sepsis Labs: @LABRCNTIP (procalcitonin:4,lacticacidven:4)  ) Recent Results (from the past 240 hour(s))  SARS Coronavirus 2 by RT PCR (hospital order, performed in [facility] hospital lab) Nasopharyngeal Nasopharyngeal Swab     Status: None   Collection Time: 11/09/19  3:35 PM   Specimen: Nasopharyngeal Swab  Result Value Ref Range Status   SARS Coronavirus 2 NEGATIVE NEGATIVE Final    Comment: (NOTE) SARS-CoV-2 target nucleic acids are NOT DETECTED.  The SARS-CoV-2 RNA is generally detectable in upper and lower respiratory specimens during the acute phase of infection. The lowest concentration of SARS-CoV-2 viral copies this assay can detect is 250 copies / mL. A negative result does not preclude SARS-CoV-2 infection and should not be used as the sole basis for treatment or other patient management decisions.  A negative result may occur with improper specimen collection / handling, submission of specimen other than nasopharyngeal swab, presence of viral mutation(s) within the areas targeted by this assay, and inadequate number of viral copies (<250 copies / mL). A negative result must be combined with clinical observations, patient history, and epidemiological information.  Fact Sheet for Patients:   01/09/20  Fact Sheet for Healthcare  Providers: BoilerBrush.com.cy  This test is not yet approved or  cleared by the https://pope.com/ FDA and has been authorized for detection and/or diagnosis of SARS-CoV-2 by FDA under an Emergency Use Authorization (EUA).  This EUA will remain in effect (meaning this test can be used) for the duration of the COVID-19 declaration under Section 564(b)(1) of the Act, 21 U.S.C. section 360bbb-3(b)(1), unless the authorization is terminated or revoked sooner.  Performed at [facility], [address] [place], [place]          Radiology Studies: No results found.      Scheduled Meds: . apixaban  5 mg Per Tube BID  . carvedilol  3.125 mg Per Tube BID WC  . Chlorhexidine Gluconate Cloth  6 each Topical Daily  . feeding supplement (PROSource TF)  45 mL Per Tube BID  . folic acid  1 mg Per Tube Daily  . free water  200 mL Per Tube Q6H  . multivitamin  15 mL Per Tube Daily  . senna-docusate  1 tablet Oral QHS  . thiamine  100 mg Per Tube Daily   Continuous Infusions: . feeding supplement (OSMOLITE 1.5 CAL) 60 mL/hr at 11/13/19 0545     LOS: 4 days    Time spent: 25 minutes    01/13/20, MD [facility] 11/13/2019, 3:55 PM     Please page though AMION or Epic secure chat:  For 01/13/2020, [employer]

## 2019-11-13 NOTE — Progress Notes (Signed)
Had Secretary page portables for a continuous pulse oximetry.

## 2019-11-13 NOTE — Progress Notes (Signed)
Pt's sister Marco Pittman called and updated on patient condition and plan of care.  Requested to be sole visitor and chart updated with this.

## 2019-11-14 ENCOUNTER — Inpatient Hospital Stay (HOSPITAL_COMMUNITY): Payer: Medicaid Other

## 2019-11-14 ENCOUNTER — Ambulatory Visit: Payer: Medicaid Other | Admitting: Internal Medicine

## 2019-11-14 DIAGNOSIS — Z930 Tracheostomy status: Secondary | ICD-10-CM

## 2019-11-14 DIAGNOSIS — J9601 Acute respiratory failure with hypoxia: Secondary | ICD-10-CM

## 2019-11-14 LAB — GLUCOSE, CAPILLARY
Glucose-Capillary: 137 mg/dL — ABNORMAL HIGH (ref 70–99)
Glucose-Capillary: 132 mg/dL — ABNORMAL HIGH (ref 70–99)
Glucose-Capillary: 100 mg/dL — ABNORMAL HIGH (ref 70–99)
Glucose-Capillary: 102 mg/dL — ABNORMAL HIGH (ref 70–99)
Glucose-Capillary: 94 mg/dL (ref 70–99)
Glucose-Capillary: 88 mg/dL (ref 70–99)

## 2019-11-14 LAB — APTT: aPTT: 38 seconds — ABNORMAL HIGH (ref 24–36)

## 2019-11-14 LAB — HEPARIN LEVEL (UNFRACTIONATED): Heparin Unfractionated: 0.41 IU/mL (ref 0.30–0.70)

## 2019-11-14 MED ORDER — HEPARIN (PORCINE) 25000 UT/250ML-% IV SOLN
1400.0000 [IU]/h | INTRAVENOUS | Status: AC
  Administered 2019-11-14 – 2019-11-15 (×2): 1600 [IU]/h via INTRAVENOUS
  Filled 2019-11-14 (×2): qty 250

## 2019-11-14 MED ORDER — KCL IN DEXTROSE-NACL 20-5-0.9 MEQ/L-%-% IV SOLN
INTRAVENOUS | Status: DC
  Filled 2019-11-14 (×7): qty 1000

## 2019-11-14 NOTE — Progress Notes (Signed)
PROGRESS NOTE    Marco Pittman  BPZ:[id number] DOB: 31-May-1960 DOA: 11/09/2019 PCP: Marcine Matar, MD      Brief Narrative:  [name] is a 59 y.o. M with HTN, recent angioedema due to ACEi requiring intubation, recent acute PE and aspiration pneumonia in July 2021 requiring re-intubation, as well as chronic alcohol use and hx pancreatitis who presented with wheezing/stridor.    ENT emergently consulted in the ER, performed bedside laryngoscopy that showed glottic stenosis and took the patient for tracheostomy.         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 [name] Had recently undergone multiple intubations and extubations for:  -Angioedema with intubation 7/15 7/18  -Aspiration pneumonia and pulmonary embolism, intubated 7/26-7/27  -Reintubated the same hospitalization due to upper airway wheezing, requiring reintubation 7/27-7/29  Now patient presented with shortness of breath, stridor for a few days prior to admission.  ENT consulted in the ER took for emergent tracheostomy.  This now stable for discharge. -Trach teaching -Pulmonology have ordered DME Trach supplies and placed outpatient Trach clinic referral -[facility] consult    Dysphagia Post-tracheostomy, patient failed swallow eval by SLP and MBS showed extensive aspiration.    On repeat MBS today, no improvement.  SLP note that his swallowing dysfunction is to the degree seen with CNS insults, and out of proportion for that typically seen with trach placement.  It is our opinion that this will not resolve quickly, and movement to place PEG is appropriate, while the patient proceeds with outpatient SLP therapy.  -Consult Gen Surg for PEG placement -Hold apixaban for PEG placement, last dose PM 8/8  -Obtain MRI brain to rule out stroke-related dysphagia     Nutrition NG tube with tube feeds over the weekend, removed to optimize swallow eval.  IR feel that  percutaenous placement is not feasible, will need Gen Surg. -NPO -IVF until PEG placed, then start tube feeds    Hypertension BP controlled -Hold amlodipine and carvedilol until able to take p.o. -Continue as needed labetalol or hydralazine for severe range pressures  Recent pulmonary embolism Diagnosed during recent hospitalization, started on apixaban.  Last dose apixaban 8/8 to be held for PICC placement and resumed. -Hold apixaban -Start heparin bridge  Alcohol use disorder No evidence of withdrawal -Continue MVI, folate  Homelessness Patient has no steady housing for now.  This will complicate discharge, as the patient is now disabled, unable to work, dependent on studious tracheostomy care, and likely tube feeds for the foreseeable future           Disposition: Status is: Inpatient  Remains inpatient appropriate because:unable to take any oral intake   Dispo: The patient is from: Home              Anticipated d/c is to: Home              Anticipated d/c date is: > 3 days              Patient currently is not medically stable to d/c.    The patient was admitted with stridor, found to have subglottic stenosis.  Tracheostomy was placed.  Routinely he would be able to advance his diet and be discharged by now, however he appears to have severe dysphagia.  We will obtain an MRI to make sure this is not central in origin.  We will arrange for general surgery to place a PEG tube.  If the MRI is  normal, and his PEG is in place, likely discharge later this week.          MDM: The below labs and imaging reports were reviewed and summarized above.  Medication management as above.  Anticoagulation managed.   DVT prophylaxis:   Code Status: FULL Family Communication: Girlfriend at the bedside    Consultants:   ENT  Pulm  Procedures:   8/4 Tracheostomy  8/6 MBS -- severe dysphagia  8/9 MBS repeat -- severe dysphagia,  unchanged  Antimicrobials:      Culture data:              Subjective: He has no new complaints, no cough, discomfort, fever, dyspnea.  No confusion.  Secretions from his trach are within normal limits.  Objective: Vitals:   11/14/19 0557 11/14/19 0745 11/14/19 1054 11/14/19 1321  BP: 109/66 127/81    Pulse: 80 81 84   Resp: 19  20   Temp: 99.2 F (37.3 C) 99.5 F (37.5 C)    TempSrc: Oral Oral    SpO2: 95% 97%  97%  Weight:      Height:        Intake/Output Summary (Last 24 hours) at 11/14/2019 1449 Last data filed at 11/14/2019 1696 Gross per 24 hour  Intake --  Output 600 ml  Net -600 ml   Filed Weights   11/11/19 0656 11/12/19 0630 11/13/19 0403  Weight: 74.3 kg 74.3 kg 74.1 kg    Examination: General appearance: Adult male, lying in bed, watching television, interactive, no acute distress     HEENT: Anicteric, conjunctival pink, no nasal deformity, discharge, or epistaxis.  Tracheostomy in place, normal in appearance.  Oropharynx moist, no oral lesions Skin:  Cardiac: RRR, no murmurs, no lower extremity edema Respiratory: Respiratory effort normal, lungs clear without rales or wheezes Abdomen: Abdomen soft no tenderness palpation or guarding, no ascites or distention MSK: Normal muscle bulk and tone Neuro: Awake and alert, extraocular movements intact, moves all extremities normal strength and coordination, speech hypophonic due to trach Psych: Sensorium intact responding to questions or attempting to mouth answers, attention normal, affect normal, judgment insight appear normal       Data Reviewed: I have personally reviewed following labs and imaging studies:  CBC: Recent Labs  Lab 11/09/19 1500 11/09/19 1551 11/09/19 1829 11/10/19 0414  WBC 5.4  --  9.1 6.5  NEUTROABS 3.1  --   --   --   HGB 14.0 15.0 13.5 13.6  HCT 40.8 44.0 38.8* 38.0*  MCV 90.1  --  89.6 89.6  PLT 308  --  301 360   Basic Metabolic Panel: Recent Labs  Lab  11/09/19 1500 11/09/19 1551 11/09/19 1829 11/10/19 0414  NA 141 142  --  140  K 3.8 3.9  --  4.1  CL 102 101  --  102  CO2 29  --   --  26  GLUCOSE 104* 108*  --  149*  BUN 6* 8  --  11  CREATININE 0.71 0.70 0.93 0.95  CALCIUM 9.1  --   --  8.9  MG  --   --   --  2.0  PHOS  --   --   --  4.0   GFR: Estimated Creatinine Clearance: 85.6 mL/min (by C-G formula based on SCr of 0.95 mg/dL). Liver Function Tests: Recent Labs  Lab 11/09/19 1500  AST 20  ALT 30  ALKPHOS 70  BILITOT 0.3  PROT 7.2  ALBUMIN  3.2*   No results for input(s): LIPASE, AMYLASE in the last 168 hours. No results for input(s): AMMONIA in the last 168 hours. Coagulation Profile: No results for input(s): INR, PROTIME in the last 168 hours. Cardiac Enzymes: No results for input(s): CKTOTAL, CKMB, CKMBINDEX, TROPONINI in the last 168 hours. BNP (last 3 results) No results for input(s): PROBNP in the last 8760 hours. HbA1C: No results for input(s): HGBA1C in the last 72 hours. CBG: Recent Labs  Lab 11/13/19 1932 11/13/19 2307 11/14/19 0333 11/14/19 0747 11/14/19 1154  GLUCAP 140* 126* 137* 132* 100*   Lipid Profile: No results for input(s): CHOL, HDL, LDLCALC, TRIG, CHOLHDL, LDLDIRECT in the last 72 hours. Thyroid Function Tests: No results for input(s): TSH, T4TOTAL, FREET4, T3FREE, THYROIDAB in the last 72 hours. Anemia Panel: No results for input(s): VITAMINB12, FOLATE, FERRITIN, TIBC, IRON, RETICCTPCT in the last 72 hours. Urine analysis:    Component Value Date/Time   COLORURINE AMBER (A) 03/04/2016 1221   APPEARANCEUR HAZY (A) 03/04/2016 1221   APPEARANCEUR Cloudy (A) 10/06/2012 1452   LABSPEC 1.024 03/04/2016 1221   PHURINE 5.5 03/04/2016 1221   GLUCOSEU 100 (A) 03/04/2016 1221   HGBUR NEGATIVE 03/04/2016 1221   BILIRUBINUR SMALL (A) 03/04/2016 1221   BILIRUBINUR Negative 10/06/2012 1452   KETONESUR 15 (A) 03/04/2016 1221   PROTEINUR 30 (A) 03/04/2016 1221   NITRITE POSITIVE (A)  03/04/2016 1221   LEUKOCYTESUR SMALL (A) 03/04/2016 1221   LEUKOCYTESUR 1+ (A) 10/06/2012 1452   Sepsis Labs: @LABRCNTIP (procalcitonin:4,lacticacidven:4)  ) Recent Results (from the past 240 hour(s))  SARS Coronavirus 2 by RT PCR (hospital order, performed in [facility] hospital lab) Nasopharyngeal Nasopharyngeal Swab     Status: None   Collection Time: 11/09/19  3:35 PM   Specimen: Nasopharyngeal Swab  Result Value Ref Range Status   SARS Coronavirus 2 NEGATIVE NEGATIVE Final    Comment: (NOTE) SARS-CoV-2 target nucleic acids are NOT DETECTED.  The SARS-CoV-2 RNA is generally detectable in upper and lower respiratory specimens during the acute phase of infection. The lowest concentration of SARS-CoV-2 viral copies this assay can detect is 250 copies / mL. A negative result does not preclude SARS-CoV-2 infection and should not be used as the sole basis for treatment or other patient management decisions.  A negative result may occur with improper specimen collection / handling, submission of specimen other than nasopharyngeal swab, presence of viral mutation(s) within the areas targeted by this assay, and inadequate number of viral copies (<250 copies / mL). A negative result must be combined with clinical observations, patient history, and epidemiological information.  Fact Sheet for Patients:   01/09/20  Fact Sheet for Healthcare Providers: BoilerBrush.com.cy  This test is not yet approved or  cleared by the https://pope.com/ FDA and has been authorized for detection and/or diagnosis of SARS-CoV-2 by FDA under an Emergency Use Authorization (EUA).  This EUA will remain in effect (meaning this test can be used) for the duration of the COVID-19 declaration under Section 564(b)(1) of the Act, 21 U.S.C. section 360bbb-3(b)(1), unless the authorization is terminated or revoked sooner.  Performed at [facility],  [address] [place], [place]          Radiology Studies: CT ABDOMEN WO CONTRAST  Result Date: 11/14/2019 CLINICAL DATA:  59 year old with dysphagia. Evaluate anatomy for possible percutaneous gastrostomy tube placement. EXAM: CT ABDOMEN WITHOUT CONTRAST TECHNIQUE: Multidetector CT imaging of the abdomen was performed following the standard protocol without IV contrast.  COMPARISON:  Chest CT 10/31/2019 and abdominal CT 02/25/2016 FINDINGS: Lower chest: Increased consolidation at the posterior left lower lobe. Again noted is volume loss and consolidation in the right lower lobe. Hepatobiliary: No acute abnormality to the liver or gallbladder. Pancreas: Scattered pancreatic calcifications suggestive for prior inflammation. Spleen: Normal in size without focal abnormality. Adrenals/Urinary Tract: Stable appearance of the adrenal glands. No definite adrenal lesions. Mild adrenal thickening is similar to the previous examination. Normal appearance of both kidneys without stones or hydronephrosis. Urinary bladder may be distended and appears to be partially visualized in the lower abdomen. Stomach/Bowel: There is high-density contrast in the colon and small bowel loops in the anterior abdomen. Unfortunately, some of the small bowel loops are anterior to the gastric antrum region and could prohibit percutaneous gastric tube placement. No evidence for bowel obstruction. Vascular/Lymphatic: Atherosclerotic calcifications in the abdominal aorta and proximal iliac arteries. No significant lymph node enlargement in the abdomen. Other: Negative for ascites. Musculoskeletal: No acute bone abnormality. IMPRESSION: 1. Loops of small bowel are located between the anterior abdominal wall and the stomach. The small bowel loops may prohibit placement of a percutaneous gastrostomy tube. 2. Consolidation at the lung bases. Left base consolidation has progressed since the prior chest CT. Findings are concerning for  pneumonia/aspiration and atelectasis. Electronically Signed   By: Richarda OverlieAdam  Henn M.D.   On: 11/14/2019 12:00   DG Swallowing Func-Speech Pathology  Result Date: 11/14/2019 Objective Swallowing Evaluation: Type of Study: MBS-Modified Barium Swallow Study  Patient Details Name: Marco Pittman MRN: [id number] Date of Birth: 09/24/1960 Today's Date: 11/14/2019 Time: SLP Start Time (ACUTE ONLY): 0825 -SLP Stop Time (ACUTE ONLY): 0857 SLP Time Calculation (min) (ACUTE ONLY): 32 min Past Medical History: Past Medical History: Diagnosis Date . Aneurysm (HCC)  . ETOH abuse  . Hypertension  . Pancreatitis 03/04/2016 . Umbilical hernia   symptomatic umbilical hernia/notes 03/04/2016 Past Surgical History: Past Surgical History: Procedure Laterality Date . INSERTION OF MESH N/A 04/14/2016  Procedure: INSERTION OF MESH;  Surgeon: Avel Peaceodd Rosenbower, MD;  Location: [facility];  Service: General;  Laterality: N/A; . MICROLARYNGOSCOPY N/A 11/09/2019  Procedure: MICROLARYNGOSCOPY;  Surgeon: Christia ReadingBates, Dwight, MD;  [facility];  Service: ENT;  Laterality: N/A; . TRACHEOSTOMY TUBE PLACEMENT N/A 11/09/2019  Procedure: AWAKE TRACHEOSTOMY;  Surgeon: Christia ReadingBates, Dwight, MD;  [facility];  Service: ENT;  Laterality: N/A; . UMBILICAL HERNIA REPAIR N/A 04/14/2016  Procedure: UMBILICAL HERNIA REPAIR WITH MESH;  Surgeon: Avel Peaceodd Rosenbower, MD;  Location: [facility];  Service: General;  Laterality: N/A; HPI: 59 yo male smoker presented to ER 7/26 with worsening shortness of breath and chest discomfort. Found to have severe hypertension with SBP > 200.  CT chest showed b/l PE and RLL ASD with mucus plugging concerning for aspiration pneumonia. PMH: ETOH, HTN, Pancreatitis, Aneurysm. ETT 7/26-27; reintubated 7/27-29 due to angioedema. 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 Seen 11/03/19 post  extubation with regular and nectar thick recommended and upgraded to thin liquids the following day.   Subjective: pt awake in chair Assessment / Plan / Recommendation CHL IP CLINICAL IMPRESSIONS 11/14/2019 Clinical Impression Pt presents with marginal improvement in swallowing compared to prior MBS 3 days ago.  He continues to grossly aspirate unfortunately without clearance via cough nor sensation - causing SLP to be concerned for source of dysphagia.  Pt does demonstrate improved  clearance of barium into esophagus but pyriform sinus appears shallow which allows residuals to spill into open airway. Various postures including HOB lowered, head turn, chin tuck, supraglottic swallow were trialed in hopes to improve airway protection. His dysphagia continues to present as decreased laryngeal elevation *anterior laryngeal movement observed*, decreased epigllotic deflection allowing penetration/aspiration during the swallow and after as pyriform sinus spills into open larynx.  Pt continues with copious secretions that mix with barium and are aspirated.  At this time, recommend pt remain npo x tsps of water and single ice chips after mouth care only and monitor tolerance closely.  Spoke to Dr Maryfrances Bunnell with recommendations.  Recommend follow up SLP to maximize swallowing rehabilitation. Pt still had a number 8 trach with cuff deflated during testing- no PMSV in place.  Downsizing trach would not have improved swallow ability to a functional level in this SLPs opinion.   SLP Visit Diagnosis Dysphagia, pharyngoesophageal phase (R13.14) Attention and concentration deficit following -- Frontal lobe and executive function deficit following -- Impact on safety and function Risk for inadequate nutrition/hydration;Severe aspiration risk   CHL IP TREATMENT RECOMMENDATION 11/14/2019 Treatment Recommendations Therapy as outlined in treatment plan below   Prognosis 11/14/2019 Prognosis for Safe Diet Advancement Guarded Barriers to Reach  Goals Other (Comment) Barriers/Prognosis Comment -- CHL IP DIET RECOMMENDATION 11/14/2019 SLP Diet Recommendations NPO;Ice chips PRN after oral care Liquid Administration via Spoon Medication Administration Via alternative means Compensations Small sips/bites Postural Changes --   CHL IP OTHER RECOMMENDATIONS 11/14/2019 Recommended Consults -- Oral Care Recommendations Oral care QID;Other (Comment) Other Recommendations --   CHL IP FOLLOW UP RECOMMENDATIONS 11/14/2019 Follow up Recommendations Outpatient SLP   CHL IP FREQUENCY AND DURATION 11/14/2019 Speech Therapy Frequency (ACUTE ONLY) min 2x/week Treatment Duration 2 weeks      CHL IP ORAL PHASE 11/14/2019 Oral Phase Impaired Oral - Pudding Teaspoon -- Oral - Pudding Cup -- Oral - Honey Teaspoon WFL Oral - Honey Cup -- Oral - Nectar Teaspoon WFL Oral - Nectar Cup WFL Oral - Nectar Straw WFL Oral - Thin Teaspoon WFL;Premature spillage Oral - Thin Cup [facility] Oral - Thin Straw -- Oral - Puree -- Oral - Mech Soft -- Oral - Regular -- Oral - Multi-Consistency -- Oral - Pill -- Oral Phase - Comment --  CHL IP PHARYNGEAL PHASE 11/14/2019 Pharyngeal Phase Impaired Pharyngeal- Pudding Teaspoon -- Pharyngeal -- Pharyngeal- Pudding Cup -- Pharyngeal -- Pharyngeal- Honey Teaspoon Reduced laryngeal elevation;Reduced epiglottic inversion;Penetration/Apiration after swallow;Moderate aspiration;Pharyngeal residue - pyriform;Pharyngeal residue - posterior pharnyx;Other (Comment) Pharyngeal Material enters airway, passes BELOW cords without attempt by patient to eject out (silent aspiration) Pharyngeal- Honey Cup -- Pharyngeal -- Pharyngeal- Nectar Teaspoon Delayed swallow initiation-pyriform sinuses;Reduced epiglottic inversion;Reduced laryngeal elevation;Reduced airway/laryngeal closure;Penetration/Aspiration during swallow;Penetration/Apiration after swallow;Pharyngeal residue - pyriform Pharyngeal Material enters airway, passes BELOW cords without attempt by patient to eject out (silent  aspiration) Pharyngeal- Nectar Cup Significant aspiration (Amount);Pharyngeal residue - pyriform;Reduced airway/laryngeal closure;Reduced laryngeal elevation;Reduced epiglottic inversion Pharyngeal Material enters airway, passes BELOW cords without attempt by patient to eject out (silent aspiration) Pharyngeal- Nectar Straw -- Pharyngeal -- Pharyngeal- Thin Teaspoon Delayed swallow initiation-pyriform sinuses;Reduced epiglottic inversion;Reduced laryngeal elevation;Reduced airway/laryngeal closure;Reduced tongue base retraction;Significant aspiration (Amount);Penetration/Aspiration during swallow;Penetration/Apiration after swallow;Pharyngeal residue - pyriform Pharyngeal Material enters airway, passes BELOW cords without attempt by patient to eject out (silent aspiration) Pharyngeal- Thin Cup Delayed swallow initiation-pyriform sinuses;Reduced epiglottic inversion;Reduced laryngeal elevation;Reduced airway/laryngeal closure;Pharyngeal residue - pyriform;Penetration/Apiration after swallow;Penetration/Aspiration during swallow Pharyngeal Material enters airway, passes BELOW cords without attempt  by patient to eject out (silent aspiration) Pharyngeal- Thin Straw -- Pharyngeal -- Pharyngeal- Puree -- Pharyngeal -- Pharyngeal- Mechanical Soft -- Pharyngeal -- Pharyngeal- Regular -- Pharyngeal -- Pharyngeal- Multi-consistency -- Pharyngeal -- Pharyngeal- Pill -- Pharyngeal -- Pharyngeal Comment Pt is grossly aspirating secretions that mix with barium, HOB lowered to approx 45* did not improve transit into esophagus,  head turn right with honey via tsp inconsisently prevented aspiration-decreased retention, head turn left worsened swallow function with honey via tsp, cued supraglottic swallow did not prevent aspiration after swallow, aspiration occurs during swallow due to decreased laryngeal elevation *albeit minimally improved compared to prior*  and after due to pyriform sinus retention spilling into open airway  without ability to clear with cued cough, on most swallows approximately 75% of boluses are entering into the esophagus which is an improvement compared to 8/6  CHL IP CERVICAL ESOPHAGEAL PHASE 11/14/2019 Cervical Esophageal Phase -- Pudding Teaspoon -- Pudding Cup -- Honey Teaspoon -- Honey Cup -- Nectar Teaspoon -- Nectar Cup -- Nectar Straw -- Thin Teaspoon -- Thin Cup -- Thin Straw -- Puree -- Mechanical Soft -- Regular -- Multi-consistency -- Pill -- Cervical Esophageal Comment appearance of severe stasis at pyriform sinus region Rolena Infante, MS [facility] SLP Acute Rehab Services Office [phone] Chales Abrahams 11/14/2019, 10:12 AM                   Scheduled Meds: . carvedilol  3.125 mg Per Tube BID WC  . Chlorhexidine Gluconate Cloth  6 each Topical Daily  . feeding supplement (PROSource TF)  45 mL Per Tube BID  . folic acid  1 mg Per Tube Daily  . free water  200 mL Per Tube Q6H  . multivitamin  15 mL Per Tube Daily  . sennosides  5 mL Per Tube QHS  . thiamine  100 mg Per Tube Daily   Continuous Infusions: . feeding supplement (OSMOLITE 1.5 CAL) 60 mL/hr at 11/13/19 0545     LOS: 5 days    Time spent: 35 minutes    Alberteen Sam, MD [facility] 11/14/2019, 2:49 PM     Please page though AMION or Epic secure chat:  For [employer], Higher education careers adviser

## 2019-11-14 NOTE — Progress Notes (Signed)
ANTICOAGULATION CONSULT NOTE - Initial Consult  Pharmacy Consult for heparin Indication: pulmonary embolus  Allergies  Allergen Reactions  . Lisinopril Swelling    Angioedema    Patient Measurements: Height: 6\' 1"  (185.4 cm) Weight: 74.1 kg (163 lb 5.8 oz) IBW/kg (Calculated) : 79.9 Heparin Dosing Weight: 74kg  Vital Signs: Temp: 99.5 F (37.5 C) (08/09 0745) Temp Source: Oral (08/09 0745) BP: 127/81 (08/09 0745) Pulse Rate: 84 (08/09 1054)  Labs: No results for input(s): HGB, HCT, PLT, APTT, LABPROT, INR, HEPARINUNFRC, HEPRLOWMOCWT, CREATININE, CKTOTAL, CKMB, TROPONINIHS in the last 72 hours.  Estimated Creatinine Clearance: 85.6 mL/min (by C-G formula based on SCr of 0.95 mg/dL).   Medical History: Past Medical History:  Diagnosis Date  . Aneurysm (HCC)   . ETOH abuse   . Hypertension   . Pancreatitis 03/04/2016  . Umbilical hernia    symptomatic umbilical hernia/notes 03/04/2016    Assessment: 59 year old male with acute PE on 7/25. Patient was on apixaban up until last night (last dose 8/8 pm) and he was made NPO and is pending PEG placement by surgery (IR was not able to do).   New orders to transition to heparin until able to place/use a PEG tube. CBC within normal limits on 8/5. Will use aptt to monitor and adjust heparin due to recent apixaban use.   Goal of Therapy:  Heparin level 0.3-0.7 units/ml aPTT 66-102 seconds Monitor platelets by anticoagulation protocol: Yes   Plan:  Start heparin at previously therapeutic rate of 1600 units/hr Check aptt in 6 hours  10/5 PharmD., BCPS Clinical Pharmacist 11/14/2019 3:33 PM

## 2019-11-14 NOTE — Progress Notes (Addendum)
Modified Barium Swallow Progress Note  Patient Details  Name: Marco [facility]man MRN: [id number] Date of Birth: 04-May-1960  Today's Date: 11/14/2019  Modified Barium Swallow completed.  Full report located under Chart Review in the Imaging Section.  Brief recommendations include the following:  Clinical Impression  Pt presents with marginal improvement in swallowing compared to prior MBS 3 days ago.  He continues to grossly aspirate unfortunately without clearance via cough nor sensation - causing SLP to be concerned for source of dysphagia.  Pt does demonstrate improved clearance of barium into esophagus but pyriform sinus appears shallow which allows residuals to spill into open airway. Various postures including HOB lowered, head turn, chin tuck, supraglottic swallow were trialed in hopes to improve airway protection. His dysphagia continues to present as decreased laryngeal elevation *anterior laryngeal movement observed*, decreased epigllotic deflection allowing penetration/aspiration during the swallow and after as pyriform sinus spills into open larynx.  Pt continues with copious secretions that mix with barium and are aspirated.  At this time, recommend pt remain npo x tsps of water and single ice chips after mouth care only and monitor tolerance closely.  Spoke to Dr Maryfrances Bunnell with recommendations.  Recommend follow up SLP to maximize swallowing rehabilitation.   Pt still had a number 8 trach with cuff deflated during testing- no PMSV in place.  Downsizing trach would not have improved swallow ability to a functional level in this SLPs opinion.    Educated pt to findings using video loops displaying his swallow function to a normal swallow ability - which was very important given his sensorimotor deficits- lack of overt awareness of aspiration.     Swallow Evaluation Recommendations       SLP Diet Recommendations: NPO;Ice chips PRN after oral care (tsps thin water)   Liquid Administration  via: Spoon (water only)   Medication Administration: Via alternative means       Compensations: Small sips/bites       Oral Care Recommendations: Oral care QID;Other (Comment) (before any ice chip and water intake)      Rolena Infante, MS [facility] SLP Acute Rehab Services Office [phone]   Chales Abrahams 11/14/2019,10:10 AM

## 2019-11-14 NOTE — Progress Notes (Addendum)
  Speech Language Pathology Treatment: Dysphagia  Patient Details Name: Marco [facility]man MRN: [id number] DOB: 1960-09-23 Today's Date: 11/14/2019 Time: 9373-4287 SLP Time Calculation (min) (ACUTE ONLY): 15 min  Assessment / Plan / Recommendation Clinical Impression  Pt seen to determine readiness for repeat MBS.  He is alert and reports decreased secretion retention.  SLP noted however that he is continuing with expectoration of copious whitish tinged secretions from trach and is unable to perform "expectoration" using tongue base.  Pt reports consumption of 3 cups of ice over the weekend.  Pt without wincing today with volitional cough - hopefully indicating improved healing.  Educated pt to importance to continue to attempt expectoration of secretions via oral cavity if able using tongue base.  Will proceed with MBS this am.  Reached out to MD re: care plan.    HPI HPI: 59 yo male smoker presented to ER 7/26 with worsening shortness of breath and chest discomfort. Found to have severe hypertension with SBP > 200.  CT chest showed b/l PE and RLL ASD with mucus plugging concerning for aspiration pneumonia. PMH: ETOH, HTN, Pancreatitis, Aneurysm. ETT 7/26-27; reintubated 7/27-29 due to angioedema. 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 Seen 11/03/19 post extubation with regular and nectar thick recommended and upgraded to thin liquids the following day.        SLP Plan  MBS       Recommendations  Diet recommendations: NPO (ice chips) Supervision: Patient able to self feed                Oral Care Recommendations: Oral care QID Follow up Recommendations: Other (comment) (tbd) SLP Visit Diagnosis: Dysphagia, pharyngoesophageal phase (R13.14) Plan: MBS       GO                Chales Abrahams 11/14/2019, 8:11 AM Rolena Infante, MS [facility] SLP Acute  Rehab Services Office [phone]

## 2019-11-14 NOTE — Consult Note (Signed)
[facility] Consult Note  Marco Pittman 1960-12-20  [id number].    Requesting MD: Danford Chief Complaint/Reason for Consult: Dysphagia HPI:  Patient is a 59 year old male with PMH significant for HTN, Chronic pancreatitis, Hx of EtOH abuse, aneurysm, angioedema after ACEi, and recent PE who presented to [facility] 8/4 with SOB. Patient reported stridor and SOB started 2 days prior to presentation. 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 Of note, patient has undergone multiple intubations and extubations over the last few weeks as follows: Admitted for angioedema requiring intubation 7/15-7/18, discharged 7/21; Readmitted 7/26 with SOB 2/2 aspiration PNA and PE intubated 7/26-7/27; Developed upper airway wheeze and required reintubation 7/27-7/29. Patient is tolerating tracheostomy well from a respiratory standpoint, but has been unable to pass for a diet. Has been tolerating TF and having bowel function. Past abdominal surgery includes umbilical hernia repair with mesh. On apixaban for recent PE, last dose was 8/8. Patient was homeless and living in vehicle on admission but he and his girlfriend are working to arrange housing.   ROS: Review of Systems  Constitutional: Negative for chills and fever.  HENT:       Dysphagia  Respiratory: Negative for shortness of breath, wheezing and stridor.   Cardiovascular: Negative for chest pain and palpitations.  Gastrointestinal: Negative for abdominal pain, constipation, diarrhea, nausea and vomiting.  Genitourinary: Negative for dysuria, frequency and urgency.  All other systems reviewed and are negative.   Family History  Problem Relation Age of Onset  . Cancer Father 75       colon    Past Medical History:  Diagnosis Date  . Aneurysm (HCC)   . ETOH abuse   . Hypertension   . Pancreatitis 03/04/2016  . Umbilical hernia    symptomatic umbilical hernia/notes 03/04/2016    Past Surgical History:   Procedure Laterality Date  . INSERTION OF MESH N/A 04/14/2016   Procedure: INSERTION OF MESH;  Surgeon: Avel Peace, MD;  Location: [facility];  Service: General;  Laterality: N/A;  . MICROLARYNGOSCOPY N/A 11/09/2019   Procedure: MICROLARYNGOSCOPY;  Surgeon: [name], MD;  Location: [facility] OR;  Service: ENT;  Laterality: N/A;  . TRACHEOSTOMY TUBE PLACEMENT N/A 11/09/2019   Procedure: AWAKE TRACHEOSTOMY;  Surgeon: [name], MD;  Location: [facility] OR;  Service: ENT;  Laterality: N/A;  . UMBILICAL HERNIA REPAIR N/A 04/14/2016   Procedure: UMBILICAL HERNIA REPAIR WITH MESH;  Surgeon: Avel Peace, MD;  Location: [facility];  Service: General;  Laterality: N/A;    Social History:  reports that he has been smoking cigars. He has a 22.50 pack-year smoking history. He has never used smokeless tobacco. He reports current alcohol use of about 14.0 standard drinks of alcohol per week. He reports that he does not use drugs.  Allergies:  Allergies  Allergen Reactions  . Lisinopril Swelling    Angioedema    Medications Prior to Admission  Medication Sig Dispense Refill  . amLODipine (NORVASC) 10 MG tablet TAKE 1 TABLET (10 MG TOTAL) BY MOUTH DAILY. TO LOWER BLOOD PRESSURE (Patient taking differently: Take 10 mg by mouth daily. ) 30 tablet 0  . apixaban (ELIQUIS) 5 MG TABS tablet Take 2 tablets BID through 8/2, then 1 tablet BID 60 tablet 3  . carvedilol (COREG) 3.125 MG tablet Take 1 tablet (3.125 mg total) by mouth 2 (two) times daily with a meal. 60 tablet 3  . Multiple Vitamin (MULTIVITAMIN WITH MINERALS) TABS tablet Take 1  tablet by mouth daily.    Marland Kitchen thiamine 100 MG tablet Take 1 tablet (100 mg total) by mouth daily.    . ursodiol (ACTIGALL) 500 MG tablet TAKE 1 TABLET BY MOUTH 3 (THREE) TIMES DAILY. (Patient taking differently: Take 500 mg by mouth 3 (three) times daily. ) 90 tablet 1    Blood pressure (!) 142/99, pulse 88, temperature 99.8 F (37.7 C), resp.  rate 20, height 6\' 1"  (1.854 m), weight 74.1 kg, SpO2 96 %. Physical Exam:  General: pleasant, WD, WNmale who is laying in bed in NAD HEENT: head is normocephalic, atraumatic.  Sclera are noninjected.  PERRL.  Ears and nose without any masses or lesions.  Mouth is pink and moist Neck: tracheostomy in place and clean with minimal secretions Heart: regular, rate, and rhythm. Palpable radial and pedal pulses bilaterally Lungs: CTAB, no wheezes, rhonchi, or rales noted.  Respiratory effort nonlabored Abd: soft, NT, ND, +BS, no masses, hernias, or organomegaly MS: all 4 extremities are symmetrical with no cyanosis, clubbing, or edema. Skin: warm and dry with no masses, lesions, or rashes Neuro: Cranial nerves 2-12 grossly intact, sensation grossly intact throughout  Psych: A&Ox3 with an appropriate affect.   Results for orders placed or performed during the [facility] encounter of 11/09/19 (from the past 48 hour(s))  Glucose, capillary     Status: Abnormal   Collection Time: 11/12/19  4:17 PM  Result Value Ref Range   Glucose-Capillary 101 (H) 70 - 99 mg/dL    Comment: Glucose reference range applies only to samples taken after fasting for at least 8 hours.  Glucose, capillary     Status: Abnormal   Collection Time: 11/13/19  4:05 AM  Result Value Ref Range   Glucose-Capillary 151 (H) 70 - 99 mg/dL    Comment: Glucose reference range applies only to samples taken after fasting for at least 8 hours.  Glucose, capillary     Status: Abnormal   Collection Time: 11/13/19  7:26 AM  Result Value Ref Range   Glucose-Capillary 156 (H) 70 - 99 mg/dL    Comment: Glucose reference range applies only to samples taken after fasting for at least 8 hours.  Glucose, capillary     Status: Abnormal   Collection Time: 11/13/19 11:45 AM  Result Value Ref Range   Glucose-Capillary 114 (H) 70 - 99 mg/dL    Comment: Glucose reference range applies only to samples taken after fasting for at least 8 hours.   Glucose, capillary     Status: Abnormal   Collection Time: 11/13/19  3:23 PM  Result Value Ref Range   Glucose-Capillary 100 (H) 70 - 99 mg/dL    Comment: Glucose reference range applies only to samples taken after fasting for at least 8 hours.  Glucose, capillary     Status: Abnormal   Collection Time: 11/13/19  7:32 PM  Result Value Ref Range   Glucose-Capillary 140 (H) 70 - 99 mg/dL    Comment: Glucose reference range applies only to samples taken after fasting for at least 8 hours.  Glucose, capillary     Status: Abnormal   Collection Time: 11/13/19 11:07 PM  Result Value Ref Range   Glucose-Capillary 126 (H) 70 - 99 mg/dL    Comment: Glucose reference range applies only to samples taken after fasting for at least 8 hours.  Glucose, capillary     Status: Abnormal   Collection Time: 11/14/19  3:33 AM  Result Value Ref Range   Glucose-Capillary 137 (H)  70 - 99 mg/dL    Comment: Glucose reference range applies only to samples taken after fasting for at least 8 hours.  Glucose, capillary     Status: Abnormal   Collection Time: 11/14/19  7:47 AM  Result Value Ref Range   Glucose-Capillary 132 (H) 70 - 99 mg/dL    Comment: Glucose reference range applies only to samples taken after fasting for at least 8 hours.  Glucose, capillary     Status: Abnormal   Collection Time: 11/14/19 11:54 AM  Result Value Ref Range   Glucose-Capillary 100 (H) 70 - 99 mg/dL    Comment: Glucose reference range applies only to samples taken after fasting for at least 8 hours.  Glucose, capillary     Status: Abnormal   Collection Time: 11/14/19  3:51 PM  Result Value Ref Range   Glucose-Capillary 102 (H) 70 - 99 mg/dL    Comment: Glucose reference range applies only to samples taken after fasting for at least 8 hours.   CT ABDOMEN WO CONTRAST  Result Date: 11/14/2019 CLINICAL DATA:  59 year old with dysphagia. Evaluate anatomy for possible percutaneous gastrostomy tube placement. EXAM: CT ABDOMEN WITHOUT  CONTRAST TECHNIQUE: Multidetector CT imaging of the abdomen was performed following the standard protocol without IV contrast. COMPARISON:  Chest CT 10/31/2019 and abdominal CT 02/25/2016 FINDINGS: Lower chest: Increased consolidation at the posterior left lower lobe. Again noted is volume loss and consolidation in the right lower lobe. Hepatobiliary: No acute abnormality to the liver or gallbladder. Pancreas: Scattered pancreatic calcifications suggestive for prior inflammation. Spleen: Normal in size without focal abnormality. Adrenals/Urinary Tract: Stable appearance of the adrenal glands. No definite adrenal lesions. Mild adrenal thickening is similar to the previous examination. Normal appearance of both kidneys without stones or hydronephrosis. Urinary bladder may be distended and appears to be partially visualized in the lower abdomen. Stomach/Bowel: There is high-density contrast in the colon and small bowel loops in the anterior abdomen. Unfortunately, some of the small bowel loops are anterior to the gastric antrum region and could prohibit percutaneous gastric tube placement. No evidence for bowel obstruction. Vascular/Lymphatic: Atherosclerotic calcifications in the abdominal aorta and proximal iliac arteries. No significant lymph node enlargement in the abdomen. Other: Negative for ascites. Musculoskeletal: No acute bone abnormality. IMPRESSION: 1. Loops of small bowel are located between the anterior abdominal wall and the stomach. The small bowel loops may prohibit placement of a percutaneous gastrostomy tube. 2. Consolidation at the lung bases. Left base consolidation has progressed since the prior chest CT. Findings are concerning for pneumonia/aspiration and atelectasis. Electronically Signed   By: Richarda OverlieAdam  Henn M.D.   On: 11/14/2019 12:00   DG Swallowing Func-Speech Pathology  Result Date: 11/14/2019 Objective Swallowing Evaluation: Type of Study: MBS-Modified Barium Swallow Study  Patient Details  Name: Marco Pittman MRN: [id number] Date of Birth: 08/16/1960 Today's Date: 11/14/2019 Time: SLP Start Time (ACUTE ONLY): 0825 -SLP Stop Time (ACUTE ONLY): 0857 SLP Time Calculation (min) (ACUTE ONLY): 32 min Past Medical History: Past Medical History: Diagnosis Date . Aneurysm (HCC)  . ETOH abuse  . Hypertension  . Pancreatitis 03/04/2016 . Umbilical hernia   symptomatic umbilical hernia/notes 03/04/2016 Past Surgical History: Past Surgical History: Procedure Laterality Date . INSERTION OF MESH N/A 04/14/2016  Procedure: INSERTION OF MESH;  Surgeon: Avel Peaceodd Rosenbower, MD;  Location: [facility];  Service: General;  Laterality: N/A; . MICROLARYNGOSCOPY N/A 11/09/2019  Procedure: MICROLARYNGOSCOPY;  Surgeon: Christia ReadingBates, Dwight, MD;  [facility];  Service: ENT;  Laterality: N/A; . TRACHEOSTOMY TUBE PLACEMENT N/A 11/09/2019  Procedure: AWAKE TRACHEOSTOMY;  Surgeon: [name], MD;  Location: [facility];  Service: ENT;  Laterality: N/A; . UMBILICAL HERNIA REPAIR N/A 04/14/2016  Procedure: UMBILICAL HERNIA REPAIR WITH MESH;  Surgeon: Avel Peace, MD;  Location: [facility];  Service: General;  Laterality: N/A; HPI: 59 yo male smoker presented to ER 7/26 with worsening shortness of breath and chest discomfort. Found to have severe hypertension with SBP > 200.  CT chest showed b/l PE and RLL ASD with mucus plugging concerning for aspiration pneumonia. PMH: ETOH, HTN, Pancreatitis, Aneurysm. ETT 7/26-27; reintubated 7/27-29 due to angioedema. 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 Seen 11/03/19 post extubation with regular and nectar thick recommended and upgraded to thin liquids the following day.   Subjective: pt awake in chair Assessment / Plan / Recommendation CHL IP CLINICAL IMPRESSIONS 11/14/2019 Clinical Impression Pt presents with marginal improvement in  swallowing compared to prior MBS 3 days ago.  He continues to grossly aspirate unfortunately without clearance via cough nor sensation - causing SLP to be concerned for source of dysphagia.  Pt does demonstrate improved clearance of barium into esophagus but pyriform sinus appears shallow which allows residuals to spill into open airway. Various postures including HOB lowered, head turn, chin tuck, supraglottic swallow were trialed in hopes to improve airway protection. His dysphagia continues to present as decreased laryngeal elevation *anterior laryngeal movement observed*, decreased epigllotic deflection allowing penetration/aspiration during the swallow and after as pyriform sinus spills into open larynx.  Pt continues with copious secretions that mix with barium and are aspirated.  At this time, recommend pt remain npo x tsps of water and single ice chips after mouth care only and monitor tolerance closely.  Spoke to Dr Maryfrances Bunnell with recommendations.  Recommend follow up SLP to maximize swallowing rehabilitation. Pt still had a number 8 trach with cuff deflated during testing- no PMSV in place.  Downsizing trach would not have improved swallow ability to a functional level in this SLPs opinion.   SLP Visit Diagnosis Dysphagia, pharyngoesophageal phase (R13.14) Attention and concentration deficit following -- Frontal lobe and executive function deficit following -- Impact on safety and function Risk for inadequate nutrition/hydration;Severe aspiration risk   CHL IP TREATMENT RECOMMENDATION 11/14/2019 Treatment Recommendations Therapy as outlined in treatment plan below   Prognosis 11/14/2019 Prognosis for Safe Diet Advancement Guarded Barriers to Reach Goals Other (Comment) Barriers/Prognosis Comment -- CHL IP DIET RECOMMENDATION 11/14/2019 SLP Diet Recommendations NPO;Ice chips PRN after oral care Liquid Administration via Spoon Medication Administration Via alternative means Compensations Small sips/bites Postural  Changes --   CHL IP OTHER RECOMMENDATIONS 11/14/2019 Recommended Consults -- Oral Care Recommendations Oral care QID;Other (Comment) Other Recommendations --   CHL IP FOLLOW UP RECOMMENDATIONS 11/14/2019 Follow up Recommendations Outpatient SLP   CHL IP FREQUENCY AND DURATION 11/14/2019 Speech Therapy Frequency (ACUTE ONLY) min 2x/week Treatment Duration 2 weeks      CHL IP ORAL PHASE 11/14/2019 Oral Phase Impaired Oral - Pudding Teaspoon -- Oral - Pudding Cup -- Oral - Honey Teaspoon WFL Oral - Honey Cup -- Oral - Nectar Teaspoon WFL Oral - Nectar Cup WFL Oral - Nectar Straw WFL Oral - Thin Teaspoon WFL;Premature spillage Oral - Thin Cup [facility] Oral - Thin Straw -- Oral - Puree -- Oral - Mech Soft -- Oral - Regular -- Oral -  Multi-Consistency -- Oral - Pill -- Oral Phase - Comment --  CHL IP PHARYNGEAL PHASE 11/14/2019 Pharyngeal Phase Impaired Pharyngeal- Pudding Teaspoon -- Pharyngeal -- Pharyngeal- Pudding Cup -- Pharyngeal -- Pharyngeal- Honey Teaspoon Reduced laryngeal elevation;Reduced epiglottic inversion;Penetration/Apiration after swallow;Moderate aspiration;Pharyngeal residue - pyriform;Pharyngeal residue - posterior pharnyx;Other (Comment) Pharyngeal Material enters airway, passes BELOW cords without attempt by patient to eject out (silent aspiration) Pharyngeal- Honey Cup -- Pharyngeal -- Pharyngeal- Nectar Teaspoon Delayed swallow initiation-pyriform sinuses;Reduced epiglottic inversion;Reduced laryngeal elevation;Reduced airway/laryngeal closure;Penetration/Aspiration during swallow;Penetration/Apiration after swallow;Pharyngeal residue - pyriform Pharyngeal Material enters airway, passes BELOW cords without attempt by patient to eject out (silent aspiration) Pharyngeal- Nectar Cup Significant aspiration (Amount);Pharyngeal residue - pyriform;Reduced airway/laryngeal closure;Reduced laryngeal elevation;Reduced epiglottic inversion Pharyngeal Material enters airway, passes BELOW cords without attempt by patient to  eject out (silent aspiration) Pharyngeal- Nectar Straw -- Pharyngeal -- Pharyngeal- Thin Teaspoon Delayed swallow initiation-pyriform sinuses;Reduced epiglottic inversion;Reduced laryngeal elevation;Reduced airway/laryngeal closure;Reduced tongue base retraction;Significant aspiration (Amount);Penetration/Aspiration during swallow;Penetration/Apiration after swallow;Pharyngeal residue - pyriform Pharyngeal Material enters airway, passes BELOW cords without attempt by patient to eject out (silent aspiration) Pharyngeal- Thin Cup Delayed swallow initiation-pyriform sinuses;Reduced epiglottic inversion;Reduced laryngeal elevation;Reduced airway/laryngeal closure;Pharyngeal residue - pyriform;Penetration/Apiration after swallow;Penetration/Aspiration during swallow Pharyngeal Material enters airway, passes BELOW cords without attempt by patient to eject out (silent aspiration) Pharyngeal- Thin Straw -- Pharyngeal -- Pharyngeal- Puree -- Pharyngeal -- Pharyngeal- Mechanical Soft -- Pharyngeal -- Pharyngeal- Regular -- Pharyngeal -- Pharyngeal- Multi-consistency -- Pharyngeal -- Pharyngeal- Pill -- Pharyngeal -- Pharyngeal Comment Pt is grossly aspirating secretions that mix with barium, HOB lowered to approx 45* did not improve transit into esophagus,  head turn right with honey via tsp inconsisently prevented aspiration-decreased retention, head turn left worsened swallow function with honey via tsp, cued supraglottic swallow did not prevent aspiration after swallow, aspiration occurs during swallow due to decreased laryngeal elevation *albeit minimally improved compared to prior*  and after due to pyriform sinus retention spilling into open airway without ability to clear with cued cough, on most swallows approximately 75% of boluses are entering into the esophagus which is an improvement compared to 8/6  CHL IP CERVICAL ESOPHAGEAL PHASE 11/14/2019 Cervical Esophageal Phase -- Pudding Teaspoon -- Pudding Cup -- Honey  Teaspoon -- Honey Cup -- Nectar Teaspoon -- Nectar Cup -- Nectar Straw -- Thin Teaspoon -- Thin Cup -- Thin Straw -- Puree -- Mechanical Soft -- Regular -- Multi-consistency -- Pill -- Cervical Esophageal Comment appearance of severe stasis at pyriform sinus region Marco Infante, MS [facility] SLP Acute Rehab Services Office [phone] Chales Abrahams 11/14/2019, 10:12 AM                 Assessment/Plan HTN Recent PE on Apixaban - last dose PM 8/8 Recent angioedema after ACEi Hx of EtOH abuse Hx of chronic pancreatitis  Homelessness  Dysphagia - will plan for PEG vs laparoscopic assisted vs open gastrostomy tube placement, likely 8/11 - will need to hold TF the night prior and hold heparin gtt 6 hrs prior  - patient is agreeable to this plan     Juliet Rude, [facility] Surgery 11/14/2019, 4:12 PM Please see Amion for pager number during day hours 7:00am-4:30pm

## 2019-11-14 NOTE — Progress Notes (Signed)
ENT Progress Note: POD #5 s/p Procedure(s): AWAKE TRACHEOSTOMY MICROLARYNGOSCOPY   Subjective: Patient stable, airway intact  Objective: Vital signs in last 24 hours: Temp:  [98.9 F (37.2 C)-99.5 F (37.5 C)] 99.5 F (37.5 C) (08/09 0745) Pulse Rate:  [75-84] 84 (08/09 1054) Resp:  [16-20] 20 (08/09 1054) BP: (109-127)/(66-81) 127/81 (08/09 0745) SpO2:  [93 %-100 %] 97 % (08/09 1321) FiO2 (%):  [28 %] 28 % (08/09 1321) Weight change:  Last BM Date: 11/09/19  Intake/Output from previous day: 08/08 0701 - 08/09 0700 In: 0  Out: 1090 [Urine:1090] Intake/Output this shift: No intake/output data recorded.  Labs: No results for input(s): WBC, HGB, HCT, PLT in the last 72 hours. No results for input(s): NA, K, CL, CO2, GLUCOSE, BUN, CALCIUM in the last 72 hours.  Invalid input(s): CREATININR  Studies/Results: CT ABDOMEN WO CONTRAST  Result Date: 11/14/2019 CLINICAL DATA:  59 year old with dysphagia. Evaluate anatomy for possible percutaneous gastrostomy tube placement. EXAM: CT ABDOMEN WITHOUT CONTRAST TECHNIQUE: Multidetector CT imaging of the abdomen was performed following the standard protocol without IV contrast. COMPARISON:  Chest CT 10/31/2019 and abdominal CT 02/25/2016 FINDINGS: Lower chest: Increased consolidation at the posterior left lower lobe. Again noted is volume loss and consolidation in the right lower lobe. Hepatobiliary: No acute abnormality to the liver or gallbladder. Pancreas: Scattered pancreatic calcifications suggestive for prior inflammation. Spleen: Normal in size without focal abnormality. Adrenals/Urinary Tract: Stable appearance of the adrenal glands. No definite adrenal lesions. Mild adrenal thickening is similar to the previous examination. Normal appearance of both kidneys without stones or hydronephrosis. Urinary bladder may be distended and appears to be partially visualized in the lower abdomen. Stomach/Bowel: There is high-density contrast in  the colon and small bowel loops in the anterior abdomen. Unfortunately, some of the small bowel loops are anterior to the gastric antrum region and could prohibit percutaneous gastric tube placement. No evidence for bowel obstruction. Vascular/Lymphatic: Atherosclerotic calcifications in the abdominal aorta and proximal iliac arteries. No significant lymph node enlargement in the abdomen. Other: Negative for ascites. Musculoskeletal: No acute bone abnormality. IMPRESSION: 1. Loops of small bowel are located between the anterior abdominal wall and the stomach. The small bowel loops may prohibit placement of a percutaneous gastrostomy tube. 2. Consolidation at the lung bases. Left base consolidation has progressed since the prior chest CT. Findings are concerning for pneumonia/aspiration and atelectasis. Electronically Signed   By: Richarda Overlie M.D.   On: 11/14/2019 12:00   DG Swallowing Func-Speech Pathology  Result Date: 11/14/2019 Objective Swallowing Evaluation: Type of Study: MBS-Modified Barium Swallow Study  Patient Details Name: HORALD BIRKY MRN: [id number] Date of Birth: 1960/05/08 Today's Date: 11/14/2019 Time: SLP Start Time (ACUTE ONLY): 0825 -SLP Stop Time (ACUTE ONLY): 0857 SLP Time Calculation (min) (ACUTE ONLY): 32 min Past Medical History: Past Medical History: Diagnosis Date  Aneurysm (HCC)   ETOH abuse   Hypertension   Pancreatitis 03/04/2016  Umbilical hernia   symptomatic umbilical hernia/notes 03/04/2016 Past Surgical History: Past Surgical History: Procedure Laterality Date  INSERTION OF MESH N/A 04/14/2016  Procedure: INSERTION OF MESH;  Surgeon: Avel Peace, MD;  Location: [facility];  Service: General;  Laterality: N/A;  MICROLARYNGOSCOPY N/A 11/09/2019  Procedure: MICROLARYNGOSCOPY;  Surgeon: [name], MD;  Location: [facility] OR;  Service: ENT;  Laterality: N/A;  TRACHEOSTOMY TUBE PLACEMENT N/A 11/09/2019  Procedure: AWAKE TRACHEOSTOMY;  Surgeon: [name], MD;   Location: [facility] OR;  Service: ENT;  Laterality: N/A;  UMBILICAL HERNIA  REPAIR N/A 04/14/2016  Procedure: UMBILICAL HERNIA REPAIR WITH MESH;  Surgeon: Avel Peace, MD;  Location: [facility];  Service: General;  Laterality: N/A; HPI: 59 yo male smoker presented to ER 7/26 with worsening shortness of breath and chest discomfort. Found to have severe hypertension with SBP > 200.  CT chest showed b/l PE and RLL ASD with mucus plugging concerning for aspiration pneumonia. PMH: ETOH, HTN, Pancreatitis, Aneurysm. ETT 7/26-27; reintubated 7/27-29 due to angioedema. 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 Seen 11/03/19 post extubation with regular and nectar thick recommended and upgraded to thin liquids the following day.   Subjective: pt awake in chair Assessment / Plan / Recommendation CHL IP CLINICAL IMPRESSIONS 11/14/2019 Clinical Impression Pt presents with marginal improvement in swallowing compared to prior MBS 3 days ago.  He continues to grossly aspirate unfortunately without clearance via cough nor sensation - causing SLP to be concerned for source of dysphagia.  Pt does demonstrate improved clearance of barium into esophagus but pyriform sinus appears shallow which allows residuals to spill into open airway. Various postures including HOB lowered, head turn, chin tuck, supraglottic swallow were trialed in hopes to improve airway protection. His dysphagia continues to present as decreased laryngeal elevation *anterior laryngeal movement observed*, decreased epigllotic deflection allowing penetration/aspiration during the swallow and after as pyriform sinus spills into open larynx.  Pt continues with copious secretions that mix with barium and are aspirated.  At this time, recommend pt remain npo x tsps of water and single ice chips after mouth care only and monitor  tolerance closely.  Spoke to Dr Maryfrances Bunnell with recommendations.  Recommend follow up SLP to maximize swallowing rehabilitation. Pt still had a number 8 trach with cuff deflated during testing- no PMSV in place.  Downsizing trach would not have improved swallow ability to a functional level in this SLPs opinion.   SLP Visit Diagnosis Dysphagia, pharyngoesophageal phase (R13.14) Attention and concentration deficit following -- Frontal lobe and executive function deficit following -- Impact on safety and function Risk for inadequate nutrition/hydration;Severe aspiration risk   CHL IP TREATMENT RECOMMENDATION 11/14/2019 Treatment Recommendations Therapy as outlined in treatment plan below   Prognosis 11/14/2019 Prognosis for Safe Diet Advancement Guarded Barriers to Reach Goals Other (Comment) Barriers/Prognosis Comment -- CHL IP DIET RECOMMENDATION 11/14/2019 SLP Diet Recommendations NPO;Ice chips PRN after oral care Liquid Administration via Spoon Medication Administration Via alternative means Compensations Small sips/bites Postural Changes --   CHL IP OTHER RECOMMENDATIONS 11/14/2019 Recommended Consults -- Oral Care Recommendations Oral care QID;Other (Comment) Other Recommendations --   CHL IP FOLLOW UP RECOMMENDATIONS 11/14/2019 Follow up Recommendations Outpatient SLP   CHL IP FREQUENCY AND DURATION 11/14/2019 Speech Therapy Frequency (ACUTE ONLY) min 2x/week Treatment Duration 2 weeks      CHL IP ORAL PHASE 11/14/2019 Oral Phase Impaired Oral - Pudding Teaspoon -- Oral - Pudding Cup -- Oral - Honey Teaspoon WFL Oral - Honey Cup -- Oral - Nectar Teaspoon WFL Oral - Nectar Cup WFL Oral - Nectar Straw WFL Oral - Thin Teaspoon WFL;Premature spillage Oral - Thin Cup [facility] Oral - Thin Straw -- Oral - Puree -- Oral - Mech Soft -- Oral - Regular -- Oral - Multi-Consistency -- Oral - Pill -- Oral Phase - Comment --  CHL IP PHARYNGEAL PHASE 11/14/2019 Pharyngeal Phase Impaired Pharyngeal- Pudding Teaspoon -- Pharyngeal -- Pharyngeal-  Pudding Cup --  Pharyngeal -- Pharyngeal- Honey Teaspoon Reduced laryngeal elevation;Reduced epiglottic inversion;Penetration/Apiration after swallow;Moderate aspiration;Pharyngeal residue - pyriform;Pharyngeal residue - posterior pharnyx;Other (Comment) Pharyngeal Material enters airway, passes BELOW cords without attempt by patient to eject out (silent aspiration) Pharyngeal- Honey Cup -- Pharyngeal -- Pharyngeal- Nectar Teaspoon Delayed swallow initiation-pyriform sinuses;Reduced epiglottic inversion;Reduced laryngeal elevation;Reduced airway/laryngeal closure;Penetration/Aspiration during swallow;Penetration/Apiration after swallow;Pharyngeal residue - pyriform Pharyngeal Material enters airway, passes BELOW cords without attempt by patient to eject out (silent aspiration) Pharyngeal- Nectar Cup Significant aspiration (Amount);Pharyngeal residue - pyriform;Reduced airway/laryngeal closure;Reduced laryngeal elevation;Reduced epiglottic inversion Pharyngeal Material enters airway, passes BELOW cords without attempt by patient to eject out (silent aspiration) Pharyngeal- Nectar Straw -- Pharyngeal -- Pharyngeal- Thin Teaspoon Delayed swallow initiation-pyriform sinuses;Reduced epiglottic inversion;Reduced laryngeal elevation;Reduced airway/laryngeal closure;Reduced tongue base retraction;Significant aspiration (Amount);Penetration/Aspiration during swallow;Penetration/Apiration after swallow;Pharyngeal residue - pyriform Pharyngeal Material enters airway, passes BELOW cords without attempt by patient to eject out (silent aspiration) Pharyngeal- Thin Cup Delayed swallow initiation-pyriform sinuses;Reduced epiglottic inversion;Reduced laryngeal elevation;Reduced airway/laryngeal closure;Pharyngeal residue - pyriform;Penetration/Apiration after swallow;Penetration/Aspiration during swallow Pharyngeal Material enters airway, passes BELOW cords without attempt by patient to eject out (silent aspiration) Pharyngeal- Thin  Straw -- Pharyngeal -- Pharyngeal- Puree -- Pharyngeal -- Pharyngeal- Mechanical Soft -- Pharyngeal -- Pharyngeal- Regular -- Pharyngeal -- Pharyngeal- Multi-consistency -- Pharyngeal -- Pharyngeal- Pill -- Pharyngeal -- Pharyngeal Comment Pt is grossly aspirating secretions that mix with barium, HOB lowered to approx 45* did not improve transit into esophagus,  head turn right with honey via tsp inconsisently prevented aspiration-decreased retention, head turn left worsened swallow function with honey via tsp, cued supraglottic swallow did not prevent aspiration after swallow, aspiration occurs during swallow due to decreased laryngeal elevation *albeit minimally improved compared to prior*  and after due to pyriform sinus retention spilling into open airway without ability to clear with cued cough, on most swallows approximately 75% of boluses are entering into the esophagus which is an improvement compared to 8/6  CHL IP CERVICAL ESOPHAGEAL PHASE 11/14/2019 Cervical Esophageal Phase -- Pudding Teaspoon -- Pudding Cup -- Honey Teaspoon -- Honey Cup -- Nectar Teaspoon -- Nectar Cup -- Nectar Straw -- Thin Teaspoon -- Thin Cup -- Thin Straw -- Puree -- Mechanical Soft -- Regular -- Multi-consistency -- Pill -- Cervical Esophageal Comment appearance of severe stasis at pyriform sinus region Rolena Infante, MS [facility] SLP Acute Rehab Services Office [phone] Chales Abrahams 11/14/2019, 10:12 AM                PHYSICAL EXAM: Tracheostomy tube removed and downsized without difficulty.  Stoma intact at postop day 5.   Assessment/Plan: Patient tolerated tracheostomy tube change without difficulty.  Shiley #6 uncuffed trach placed without difficulty at bedside.  Patient may begin Passy-Muir valve training.  Recommend tracheostomy tube training including suction and changing of inner cannula.  Patient continued to have significant issues with dysphagia and aspiration on swallowing study performed earlier today.   Appreciate SLP input and would continue swallowing training.    Osborn Coho 11/14/2019, 2:27 PM

## 2019-11-14 NOTE — Progress Notes (Addendum)
NAME:  Marco Pittman, MRN:  [id number], DOB:  12/08/1960, LOS: 5 ADMISSION DATE:  11/09/2019, CONSULTATION DATE:  11/09/2019 REFERRING MD:  Dr. Pilar Plate, CHIEF COMPLAINT:  Subglottic stenosis    Brief History   59yo male with a PMH significant for hypertension, pancreatitis, ETOH abuse, aneurysm, angioedema, and PE who presented 8/4 with chief complaint of shortness of breath. Per patient the shortness of breath coupled with stridor began 2 days prior to admission and has progressively worsened.   On arrival patient was seen in respiratory distress with tachypnea and stridor. Initial labwork was unremarkable. Given presentation ENT was consulted in ED and preformed bedside fiberoptic exam that revealed imobile vocal cords with only slit glottic space for breathing therefore decision was made to take patient for emergent awake tracheostomy per ENT.   Of note patient has underwent multiple intubations and extubations over the last few weeks as follows: Admitted for angioedema requiring intubation 7/15-7/18, discharged 7/21 Readmitted 7/26 with SOB 2/2 aspiration PNA and PE intubated 7/26-7/27 Developed upper airway wheeze and required reintubation 7/27-7/29   Past Medical History  ETHO abuse Pancreatitis Hypertension PE Angioedema   Significant Hospital Events   8/04 Admit, underwent emergent awake trach for sub glottic stenosis  8/05 ATC 35%, SLP eval pending  8/09 MBS > NPO, Ice chips PRN   Consults:  ENT  Procedures:  Trach (awake, per ENT) 8/4 >>   Significant Diagnostic Tests:  DG soft neck 8/4 > negative  CXR 8/4 > negative  MBS 8/9 >> NPO, Ice chips PRN  Micro Data:  COVID 8/4 >> negative   Antimicrobials:     Interim history/subjective:  Pt returned from MBS > diet rec's for NPO with PRN ice chips  Afebrile   Objective   Blood pressure 127/81, pulse 84, temperature 99.5 F (37.5 C), temperature source Oral, resp. rate 20, height 6\' 1"  (1.854 m), weight 74.1 kg,  SpO2 97 %.    FiO2 (%):  [28 %] 28 %   Intake/Output Summary (Last 24 hours) at 11/14/2019 1149 Last data filed at 11/14/2019 01/14/2020 Gross per 24 hour  Intake --  Output 600 ml  Net -600 ml   Filed Weights   11/11/19 0656 11/12/19 0630 11/13/19 0403  Weight: 74.3 kg 74.3 kg 74.1 kg    Examination: General: adult male lying in bed in NAD  HEENT: MM pink/moist, trach #8 midline clean / sutures intact Neuro: Awake / alert, communicates by mouthing wods CV: s1s2 rrr, no m/r/g PULM: non-labored on RA, lungs bilaterally clear, clear to white mucus at trach site, pt able to cough and clear secretions  GI: soft, bsx4 active  Extremities: warm/dry, no edema  Skin: no rashes or lesions  Resolved Hospital Problem list     Assessment & Plan:   Subglottic stenosis  Acute Respiratory Insuffiency secondary to Subglottic Stenosis  Tracheostomy Status  In the setting of multiple intubations and extubations over the last few weeks. S/P emergent tracheostomy per ENT.  -wean O2 to off for sats >90% -continue SLP efforts  -aspiration precautions  -trach care per protocol, begin trach teaching with patient  -downsize might help but not likely enough that he will be able to quickly eat / maintain nutrition  -downsize to #6 per ENT   Home Trach / Discharge Needs -DME ordered for discharge: #6 shiley cuffless trach with back up #4 cuffless trach, suction, trach supplies (gauze, cleaning supplies), PMV -outpatient SLP follow up  -follow up in trach clinic with  [name], ACNP [phone] (called and left message for appt)   Recent PE -Diagnosed with subsegmental PE and placed on Eliquis 7/31 -continue lovenox until able to safely take PO's   Hx of Hypertension Home medications include Coreg, and norvasc -hold PO meds -continue coreg PT + PRN IV hydralazine, labetalol   HX of ETOH abuse  -per primary   At Risk Malnutrition  -TF per primary / will likely need cortrack placement   -unfortunately, he failed his MBS 8/9 with gross aspiration without awarenss and will need PEG for more long term support.     Best practice:  Diet: NPO  Pain/Anxiety/Delirium protocol (if indicated): n/a VAP protocol (if indicated): n/a DVT prophylaxis: lovenox until able to take po GI prophylaxis: PPI Glucose control: SSI Mobility: as tolerated   Code Status: Full Family Communication: Patient updated 8/9.  Disposition: per [facility], PCCM will continue to follow for trach care.   Labs   CBC: Recent Labs  Lab 11/09/19 1500 11/09/19 1551 11/09/19 1829 11/10/19 0414  WBC 5.4  --  9.1 6.5  NEUTROABS 3.1  --   --   --   HGB 14.0 15.0 13.5 13.6  HCT 40.8 44.0 38.8* 38.0*  MCV 90.1  --  89.6 89.6  PLT 308  --  301 360    Basic Metabolic Panel: Recent Labs  Lab 11/09/19 1500 11/09/19 1551 11/09/19 1829 11/10/19 0414  NA 141 142  --  140  K 3.8 3.9  --  4.1  CL 102 101  --  102  CO2 29  --   --  26  GLUCOSE 104* 108*  --  149*  BUN 6* 8  --  11  CREATININE 0.71 0.70 0.93 0.95  CALCIUM 9.1  --   --  8.9  MG  --   --   --  2.0  PHOS  --   --   --  4.0   GFR: Estimated Creatinine Clearance: 85.6 mL/min (by C-G formula based on SCr of 0.95 mg/dL). Recent Labs  Lab 11/09/19 1500 11/09/19 1829 11/10/19 0414  WBC 5.4 9.1 6.5    Liver Function Tests: Recent Labs  Lab 11/09/19 1500  AST 20  ALT 30  ALKPHOS 70  BILITOT 0.3  PROT 7.2  ALBUMIN 3.2*   No results for input(s): LIPASE, AMYLASE in the last 168 hours. No results for input(s): AMMONIA in the last 168 hours.  ABG    Component Value Date/Time   PHART 7.379 11/01/2019 1337   PCO2ART 50.2 (H) 11/01/2019 1337   PO2ART 309 (H) 11/01/2019 1337   HCO3 29.8 (H) 11/01/2019 1337   TCO2 30 11/09/2019 1551   O2SAT 100.0 11/01/2019 1337     Coagulation Profile: No results for input(s): INR, PROTIME in the last 168 hours.  Cardiac Enzymes: No results for input(s): CKTOTAL, CKMB, CKMBINDEX, TROPONINI in the  last 168 hours.  HbA1C: Hemoglobin A1C  Date/Time Value Ref Range Status  05/05/2018 09:12 AM 5.9 (A) 4.0 - 5.6 % Final  10/10/2016 09:20 AM 6.1  Final   Hgb A1c MFr Bld  Date/Time Value Ref Range Status  10/18/2019 03:09 PM 5.9 (H) 4.8 - 5.6 % Final    Comment:             Prediabetes: 5.7 - 6.4          Diabetes: >6.4          Glycemic control for adults with diabetes: <7.0   03/05/2016 05:43  AM 5.9 (H) 4.8 - 5.6 % Final    Comment:    (NOTE)         Pre-diabetes: 5.7 - 6.4         Diabetes: >6.4         Glycemic control for adults with diabetes: <7.0     CBG: Recent Labs  Lab 11/13/19 1523 11/13/19 1932 11/13/19 2307 11/14/19 0333 11/14/19 0747  GLUCAP 100* 140* 126* 137* 132*        Canary Brim, MSN, NP-C [facility] 11/14/2019, 12:03 PM   Please see [email or web address] for pager details.

## 2019-11-14 NOTE — Progress Notes (Signed)
Procedure request for IR placed gastrostomy tube. CT abd reviewed and stomach is not accessible through percutaneous access. Team made aware.

## 2019-11-14 NOTE — TOC Initial Note (Addendum)
Transition of Care [facility]) - Initial/Assessment Note    Patient Details  Name: Marco Pittman MRN: [id number] Date of Birth: 1960/06/11  Transition of Care [facility]) CM/SW Contact:    Marco Chars, LCSW Phone Number: 11/14/2019, 3:35 PM  Clinical Narrative:  CSW met with pt to discuss trach plan for discharge.  Pt not talking currently, communicated by writing on piece of paper. CSW explained the need to work with [facility] for support in managing the trach and also the need for another individual to be trained in care of the trach to help at home as well.  Pt reports he lives with a friend who would be willing to get trained in care for the trach and he also has a supportive sister.  Pt nodded that he understood.  Pt is vaccinated.            Referral information faxed to Marco Pittman at [facility] for trach RN.  He reports that authorization with managed medicaid plans has been taking  7-14 days.   Expected Discharge Plan: [place] Barriers to Discharge: Continued Medical Work up, Ship broker, Equipment Delay   Patient Goals and CMS Choice Patient states their goals for this hospitalization and ongoing recovery are:: get well      Expected Discharge Plan and Services Expected Discharge Plan: [place] Acute Care Choice: [place] arrangements for the past 2 months: Single Family Home                                      Prior Living Arrangements/Services Living arrangements for the past 2 months: Single Family Home Lives with:: Friends Patient language and need for interpreter reviewed:: Yes Do you feel safe going back to the place where you live?: Yes      Need for Family Participation in Patient Care: Yes (Comment) (trach care) Care giver support system in place?: Yes (comment)   Criminal Activity/Legal Involvement Pertinent to Current Situation/Hospitalization: No - Comment as needed  Activities of Daily  Living Home Assistive Devices/Equipment: None ADL Screening (condition at time of admission) Patient's cognitive ability adequate to safely complete daily activities?: Yes Is the patient deaf or have difficulty hearing?: No Does the patient have difficulty seeing, even when wearing glasses/contacts?: No Does the patient have difficulty concentrating, remembering, or making decisions?: No Patient able to express need for assistance with ADLs?: Yes Does the patient have difficulty dressing or bathing?: No Independently performs ADLs?: Yes (appropriate for developmental age) Does the patient have difficulty walking or climbing stairs?: No Weakness of Legs: None Weakness of Arms/Hands: None  Permission Sought/Granted Permission sought to share information with : Facility Sport and exercise psychologist, Family Supports    Share Information with NAME: Marco Pittman  Permission granted to share info w AGENCY: HH, DME  Permission granted to share info w Relationship: sister     Emotional Assessment Appearance:: Appears stated age Attitude/Demeanor/Rapport: Engaged Affect (typically observed): Pleasant Orientation: : Oriented to Self, Oriented to Place, Oriented to  Time, Oriented to Situation   Psych Involvement: No (comment)  Admission diagnosis:  Subglottic stenosis [J38.6] Patient Active Problem List   Diagnosis Date Noted  . 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 Hypertensive urgency 10/31/2019  . Acute respiratory failure with hypoxia ([place]) 10/31/2019  . Mucus plugging of bronchi   . Acute metabolic encephalopathy 32/95/1884  . Malnutrition  of moderate degree 10/22/2019  . Angio-edema 10/20/2019  . Acute hypoxemic respiratory failure ([place])   . Former smoker 06/16/2017  . 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 Sessile colonic polyp 01/29/2017  . Pre-diabetes 01/27/2017  . Chronic left shoulder pain 01/27/2017  . Weight loss, unintentional 10/10/2016  . History of alcohol abuse  03/19/2016  . Chronic pancreatitis ([place]) 03/04/2016  . Essential hypertension, benign 10/06/2012   PCP:  Marco Pier, MD Pharmacy:   [id number] [place], [place] - [address] [place] [address]Phone: [phone]: [address]Phone: [phone]: [phone]    Social Determinants of Health (SDOH) Interventions    Readmission Risk Interventions No flowsheet data found.

## 2019-11-15 DIAGNOSIS — J386 Stenosis of larynx: Secondary | ICD-10-CM | POA: Diagnosis not present

## 2019-11-15 LAB — GLUCOSE, CAPILLARY
Glucose-Capillary: 103 mg/dL — ABNORMAL HIGH (ref 70–99)
Glucose-Capillary: 111 mg/dL — ABNORMAL HIGH (ref 70–99)
Glucose-Capillary: 98 mg/dL (ref 70–99)
Glucose-Capillary: 101 mg/dL — ABNORMAL HIGH (ref 70–99)
Glucose-Capillary: 89 mg/dL (ref 70–99)
Glucose-Capillary: 94 mg/dL (ref 70–99)

## 2019-11-15 LAB — APTT
aPTT: 86 seconds — ABNORMAL HIGH (ref 24–36)
aPTT: 114 seconds — ABNORMAL HIGH (ref 24–36)

## 2019-11-15 LAB — HEPARIN LEVEL (UNFRACTIONATED)
Heparin Unfractionated: 0.57 IU/mL (ref 0.30–0.70)
Heparin Unfractionated: 0.79 IU/mL — ABNORMAL HIGH (ref 0.30–0.70)

## 2019-11-15 NOTE — Progress Notes (Signed)
PROGRESS NOTE    Marco SavoyMichael W Pittman  ZOX:[id number]  DOB: 02/08/1961  DOA: 11/09/2019 PCP: Marcine MatarJohnson, Deborah B, MD Outpatient Specialists:   Hospital course:  Mr. Marco CandleCummings is a 59 y.o. M with HTN, recent angioedema due to ACEi requiring intubation, recent acute PE and aspiration pneumonia in July 2021 requiring re-intubation, as well as chronic alcohol use and hx pancreatitis who presented with wheezing/stridor.    ENT emergently consulted in the ER, performed bedside laryngoscopy that showed glottic stenosis and took the patient for tracheostomy.   Subjective:  Patient indicates mostly using his hands that he has no complaints.  He understands that he is to have a PEG tube placed tomorrow.  He denies any pain.  Denies any difficulty breathing.   Objective: Vitals:   11/15/19 0757 11/15/19 1113 11/15/19 1526 11/15/19 1551  BP:   128/82   Pulse: 82 83 77 81  Resp: 18 18 16 18   Temp:   97.6 F (36.4 C)   TempSrc:      SpO2: 93% 97% 100% 98%  Weight:      Height:        Intake/Output Summary (Last 24 hours) at 11/15/2019 1721 Last data filed at 11/15/2019 1200 Gross per 24 hour  Intake --  Output 400 ml  Net -400 ml   Filed Weights   11/11/19 0656 11/12/19 0630 11/13/19 0403  Weight: 74.3 kg 74.3 kg 74.1 kg     Exam:  General: Thin emaciated man lying in bed with trach in place in NAD.  He is awake and alert and able to communicate using hand signs. Eyes: sclera anicteric, conjuctiva mild injection bilaterally CVS: S1-S2, regular  Respiratory: Coarse breath sounds throughout GI: NABS, soft, NT  LE: No edema.  Neuro: A/O x 3, Moving all extremities equally with normal strength, CN 3-12 intact, grossly nonfocal.    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 Jenne PaneBates Patient is tolerating trach well He is to be discharged home with his trach after patient and family have undergone trach teaching. Pulmonary have ordered DME  trach supplies and placed outpatient appointment for trach clinic.  Had recently undergone multiple intubations and extubations for:             -Angioedema with intubation 7/15 7/18             -Aspiration pneumonia and pulmonary embolism, intubated 7/26-7/27             -Reintubated the same hospitalization due to upper airway wheezing, requiring reintubation 7/27-7/29  Dysphagia/nutrition Patient is to undergo PEG placement tomorrow. Will need nutrition consult after PEG is placed for tube feeds. Post-tracheostomy, patient failed swallow eval by SLP and MBS showed extensive aspiration.   Hold apixaban for PEG placement, last dose PM 8/8 MRI brain without evidence for stroke related dysphagia, only shows chronic small vessel disease.   Hypertension BP controlled off amlodipine and carvedilol Continue as needed labetalol or hydralazine for severe range pressures  Recent pulmonary embolism On Eliquis Eliquis to be held 8/8 for PEG placement with heparin bridge  Alcohol use disorder No evidence of withdrawal Continue MVI, folate  Homelessness Patient has no steady housing for now.  This will complicate discharge, as the patient is now disabled, unable to work, dependent on studious tracheostomy care, and likely tube feeds for the foreseeable future    DVT prophylaxis: On Eliquis Code Status: Full Family Communication: No family available Disposition Plan:  Patient is from: Homeless  Anticipated Discharge Location: TBD  Barriers to Discharge: Needs PEG tube  Is patient medically stable for Discharge: Not yet   Consultants:  ENT  Pulmonary  Procedures:  8/4 Tracheostomy  8/6 MBS -- severe dysphagia  8/9 MBS repeat -- severe dysphagia, unchanged  Antimicrobials:  None   Data Reviewed:  Basic Metabolic Panel: Recent Labs  Lab 11/09/19 1500 11/09/19 1551 11/09/19 1829 11/10/19 0414  NA 141 142  --  140  K 3.8 3.9  --  4.1  CL 102 101  --   102  CO2 29  --   --  26  GLUCOSE 104* 108*  --  149*  BUN 6* 8  --  11  CREATININE 0.71 0.70 0.93 0.95  CALCIUM 9.1  --   --  8.9  MG  --   --   --  2.0  PHOS  --   --   --  4.0   Liver Function Tests: Recent Labs  Lab 11/09/19 1500  AST 20  ALT 30  ALKPHOS 70  BILITOT 0.3  PROT 7.2  ALBUMIN 3.2*   No results for input(s): LIPASE, AMYLASE in the last 168 hours. No results for input(s): AMMONIA in the last 168 hours. CBC: Recent Labs  Lab 11/09/19 1500 11/09/19 1551 11/09/19 1829 11/10/19 0414  WBC 5.4  --  9.1 6.5  NEUTROABS 3.1  --   --   --   HGB 14.0 15.0 13.5 13.6  HCT 40.8 44.0 38.8* 38.0*  MCV 90.1  --  89.6 89.6  PLT 308  --  301 360   Cardiac Enzymes: No results for input(s): CKTOTAL, CKMB, CKMBINDEX, TROPONINI in the last 168 hours. BNP (last 3 results) No results for input(s): PROBNP in the last 8760 hours. CBG: Recent Labs  Lab 11/14/19 2306 11/15/19 0303 11/15/19 0731 11/15/19 1136 11/15/19 1628  GLUCAP 88 103* 111* 98 101*    Recent Results (from the past 240 hour(s))  SARS Coronavirus 2 by RT PCR (hospital order, performed in [facility] hospital lab) Nasopharyngeal Nasopharyngeal Swab     Status: None   Collection Time: 11/09/19  3:35 PM   Specimen: Nasopharyngeal Swab  Result Value Ref Range Status   SARS Coronavirus 2 NEGATIVE NEGATIVE Final    Comment: (NOTE) SARS-CoV-2 target nucleic acids are NOT DETECTED.  The SARS-CoV-2 RNA is generally detectable in upper and lower respiratory specimens during the acute phase of infection. The lowest concentration of SARS-CoV-2 viral copies this assay can detect is 250 copies / mL. A negative result does not preclude SARS-CoV-2 infection and should not be used as the sole basis for treatment or other patient management decisions.  A negative result may occur with improper specimen collection / handling, submission of specimen other than nasopharyngeal swab, presence of viral mutation(s) within  the areas targeted by this assay, and inadequate number of viral copies (<250 copies / mL). A negative result must be combined with clinical observations, patient history, and epidemiological information.  Fact Sheet for Patients:   BoilerBrush.com.cy  Fact Sheet for Healthcare Providers: https://pope.com/  This test is not yet approved or  cleared by the [place] FDA and has been authorized for detection and/or diagnosis of SARS-CoV-2 by FDA under an Emergency Use Authorization (EUA).  This EUA will remain in effect (meaning this test can be used) for the duration of the COVID-19 declaration under Section 564(b)(1) of the Act, 21 U.S.C. section 360bbb-3(b)(1), unless the authorization  is terminated or revoked sooner.  Performed at [facility], [address], [place], [place] [address]       Studies: CT ABDOMEN WO CONTRAST  Result Date: 11/14/2019 CLINICAL DATA:  59 year old with dysphagia. Evaluate anatomy for possible percutaneous gastrostomy tube placement. EXAM: CT ABDOMEN WITHOUT CONTRAST TECHNIQUE: Multidetector CT imaging of the abdomen was performed following the standard protocol without IV contrast. COMPARISON:  Chest CT 10/31/2019 and abdominal CT 02/25/2016 FINDINGS: Lower chest: Increased consolidation at the posterior left lower lobe. Again noted is volume loss and consolidation in the right lower lobe. Hepatobiliary: No acute abnormality to the liver or gallbladder. Pancreas: Scattered pancreatic calcifications suggestive for prior inflammation. Spleen: Normal in size without focal abnormality. Adrenals/Urinary Tract: Stable appearance of the adrenal glands. No definite adrenal lesions. Mild adrenal thickening is similar to the previous examination. Normal appearance of both kidneys without stones or hydronephrosis. Urinary bladder may be distended and appears to be partially visualized in the lower abdomen.  Stomach/Bowel: There is high-density contrast in the colon and small bowel loops in the anterior abdomen. Unfortunately, some of the small bowel loops are anterior to the gastric antrum region and could prohibit percutaneous gastric tube placement. No evidence for bowel obstruction. Vascular/Lymphatic: Atherosclerotic calcifications in the abdominal aorta and proximal iliac arteries. No significant lymph node enlargement in the abdomen. Other: Negative for ascites. Musculoskeletal: No acute bone abnormality. IMPRESSION: 1. Loops of small bowel are located between the anterior abdominal wall and the stomach. The small bowel loops may prohibit placement of a percutaneous gastrostomy tube. 2. Consolidation at the lung bases. Left base consolidation has progressed since the prior chest CT. Findings are concerning for pneumonia/aspiration and atelectasis. Electronically Signed   By: Richarda Overlie M.D.   On: 11/14/2019 12:00   MR BRAIN WO CONTRAST  Result Date: 11/14/2019 CLINICAL DATA:  Initial evaluation for neuro deficit, stroke suspected. EXAM: MRI HEAD WITHOUT CONTRAST TECHNIQUE: Multiplanar, multiecho pulse sequences of the brain and surrounding structures were obtained without intravenous contrast. COMPARISON:  Prior CT from 08/28/2007. FINDINGS: Brain: Mild age-related cerebral atrophy. Patchy T2/FLAIR hyperintensity within the periventricular white matter most consistent with chronic small vessel ischemic disease, mild for age. No abnormal foci of restricted diffusion to suggest acute or subacute ischemia. Gray-white matter differentiation maintained. No encephalomalacia to suggest chronic cortical infarction. No acute intracranial hemorrhage. Single chronic microhemorrhage noted at the posterior right frontal centrum semi ovale (series 24, image 33), of doubtful significance in isolation. No other foci of susceptibility artifact to suggest chronic intracranial hemorrhage. No mass lesion, midline shift or mass  effect. No hydrocephalus or extra-axial fluid collection. Pituitary gland suprasellar region normal. Midline structures intact. Vascular: Major intracranial vascular flow voids are maintained. Skull and upper cervical spine: Craniocervical junction normal. Bone marrow signal intensity somewhat heterogeneous without focal marrow replacing lesion. No scalp soft tissue abnormality. Sinuses/Orbits: Globes and orbital soft tissues demonstrate no acute finding. Remote posttraumatic defects at the bilateral lamina papyracea noted. Paranasal sinuses are largely clear. Small bilateral mastoid effusions noted, of doubtful significance. Visualized nasopharynx within normal limits. Inner ear structures grossly normal. Other: None. IMPRESSION: 1. No acute intracranial abnormality. 2. Mild age-related cerebral atrophy with chronic small vessel ischemic disease. Electronically Signed   By: Rise Mu M.D.   On: 11/14/2019 23:18   DG Swallowing Func-Speech Pathology  Result Date: 11/14/2019 Objective Swallowing Evaluation: Type of Study: MBS-Modified Barium Swallow Study  Patient Details Name: XADEN KAUFMAN MRN: [id number] Date of Birth: 03-28-1961  Today's Date: 11/14/2019 Time: SLP Start Time (ACUTE ONLY): 0825 -SLP Stop Time (ACUTE ONLY): 0857 SLP Time Calculation (min) (ACUTE ONLY): 32 min Past Medical History: Past Medical History: Diagnosis Date . Aneurysm (HCC)  . ETOH abuse  . Hypertension  . Pancreatitis 03/04/2016 . Umbilical hernia   symptomatic umbilical hernia/notes 03/04/2016 Past Surgical History: Past Surgical History: Procedure Laterality Date . INSERTION OF MESH N/A 04/14/2016  Procedure: INSERTION OF MESH;  Surgeon: Avel Peace, MD;  Location: [facility];  Service: General;  Laterality: N/A; . MICROLARYNGOSCOPY N/A 11/09/2019  Procedure: MICROLARYNGOSCOPY;  Surgeon: [name], MD;  Location: [facility] OR;  Service: ENT;  Laterality: N/A; . TRACHEOSTOMY TUBE PLACEMENT N/A 11/09/2019   Procedure: AWAKE TRACHEOSTOMY;  Surgeon: [name], MD;  Location: [facility] OR;  Service: ENT;  Laterality: N/A; . UMBILICAL HERNIA REPAIR N/A 04/14/2016  Procedure: UMBILICAL HERNIA REPAIR WITH MESH;  Surgeon: Avel Peace, MD;  Location: [facility];  Service: General;  Laterality: N/A; HPI: 59 yo male smoker presented to ER 7/26 with worsening shortness of breath and chest discomfort. Found to have severe hypertension with SBP > 200.  CT chest showed b/l PE and RLL ASD with mucus plugging concerning for aspiration pneumonia. PMH: ETOH, HTN, Pancreatitis, Aneurysm. ETT 7/26-27; reintubated 7/27-29 due to angioedema. 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 Seen 11/03/19 post extubation with regular and nectar thick recommended and upgraded to thin liquids the following day.   Subjective: pt awake in chair Assessment / Plan / Recommendation CHL IP CLINICAL IMPRESSIONS 11/14/2019 Clinical Impression Pt presents with marginal improvement in swallowing compared to prior MBS 3 days ago.  He continues to grossly aspirate unfortunately without clearance via cough nor sensation - causing SLP to be concerned for source of dysphagia.  Pt does demonstrate improved clearance of barium into esophagus but pyriform sinus appears shallow which allows residuals to spill into open airway. Various postures including HOB lowered, head turn, chin tuck, supraglottic swallow were trialed in hopes to improve airway protection. His dysphagia continues to present as decreased laryngeal elevation *anterior laryngeal movement observed*, decreased epigllotic deflection allowing penetration/aspiration during the swallow and after as pyriform sinus spills into open larynx.  Pt continues with copious secretions that mix with barium and are aspirated.  At this time, recommend pt remain npo x tsps of  water and single ice chips after mouth care only and monitor tolerance closely.  Spoke to Dr Maryfrances Bunnell with recommendations.  Recommend follow up SLP to maximize swallowing rehabilitation. Pt still had a number 8 trach with cuff deflated during testing- no PMSV in place.  Downsizing trach would not have improved swallow ability to a functional level in this SLPs opinion.   SLP Visit Diagnosis Dysphagia, pharyngoesophageal phase (R13.14) Attention and concentration deficit following -- Frontal lobe and executive function deficit following -- Impact on safety and function Risk for inadequate nutrition/hydration;Severe aspiration risk   CHL IP TREATMENT RECOMMENDATION 11/14/2019 Treatment Recommendations Therapy as outlined in treatment plan below   Prognosis 11/14/2019 Prognosis for Safe Diet Advancement Guarded Barriers to Reach Goals Other (Comment) Barriers/Prognosis Comment -- CHL IP DIET RECOMMENDATION 11/14/2019 SLP Diet Recommendations NPO;Ice chips PRN after oral care Liquid Administration via Spoon Medication Administration Via alternative means Compensations Small sips/bites Postural Changes --   CHL IP OTHER RECOMMENDATIONS 11/14/2019 Recommended Consults -- Oral Care Recommendations Oral care QID;Other (Comment) Other  Recommendations --   CHL IP FOLLOW UP RECOMMENDATIONS 11/14/2019 Follow up Recommendations Outpatient SLP   CHL IP FREQUENCY AND DURATION 11/14/2019 Speech Therapy Frequency (ACUTE ONLY) min 2x/week Treatment Duration 2 weeks      CHL IP ORAL PHASE 11/14/2019 Oral Phase Impaired Oral - Pudding Teaspoon -- Oral - Pudding Cup -- Oral - Honey Teaspoon WFL Oral - Honey Cup -- Oral - Nectar Teaspoon WFL Oral - Nectar Cup WFL Oral - Nectar Straw WFL Oral - Thin Teaspoon WFL;Premature spillage Oral - Thin Cup [facility] Oral - Thin Straw -- Oral - Puree -- Oral - Mech Soft -- Oral - Regular -- Oral - Multi-Consistency -- Oral - Pill -- Oral Phase - Comment --  CHL IP PHARYNGEAL PHASE 11/14/2019 Pharyngeal Phase Impaired  Pharyngeal- Pudding Teaspoon -- Pharyngeal -- Pharyngeal- Pudding Cup -- Pharyngeal -- Pharyngeal- Honey Teaspoon Reduced laryngeal elevation;Reduced epiglottic inversion;Penetration/Apiration after swallow;Moderate aspiration;Pharyngeal residue - pyriform;Pharyngeal residue - posterior pharnyx;Other (Comment) Pharyngeal Material enters airway, passes BELOW cords without attempt by patient to eject out (silent aspiration) Pharyngeal- Honey Cup -- Pharyngeal -- Pharyngeal- Nectar Teaspoon Delayed swallow initiation-pyriform sinuses;Reduced epiglottic inversion;Reduced laryngeal elevation;Reduced airway/laryngeal closure;Penetration/Aspiration during swallow;Penetration/Apiration after swallow;Pharyngeal residue - pyriform Pharyngeal Material enters airway, passes BELOW cords without attempt by patient to eject out (silent aspiration) Pharyngeal- Nectar Cup Significant aspiration (Amount);Pharyngeal residue - pyriform;Reduced airway/laryngeal closure;Reduced laryngeal elevation;Reduced epiglottic inversion Pharyngeal Material enters airway, passes BELOW cords without attempt by patient to eject out (silent aspiration) Pharyngeal- Nectar Straw -- Pharyngeal -- Pharyngeal- Thin Teaspoon Delayed swallow initiation-pyriform sinuses;Reduced epiglottic inversion;Reduced laryngeal elevation;Reduced airway/laryngeal closure;Reduced tongue base retraction;Significant aspiration (Amount);Penetration/Aspiration during swallow;Penetration/Apiration after swallow;Pharyngeal residue - pyriform Pharyngeal Material enters airway, passes BELOW cords without attempt by patient to eject out (silent aspiration) Pharyngeal- Thin Cup Delayed swallow initiation-pyriform sinuses;Reduced epiglottic inversion;Reduced laryngeal elevation;Reduced airway/laryngeal closure;Pharyngeal residue - pyriform;Penetration/Apiration after swallow;Penetration/Aspiration during swallow Pharyngeal Material enters airway, passes BELOW cords without attempt by  patient to eject out (silent aspiration) Pharyngeal- Thin Straw -- Pharyngeal -- Pharyngeal- Puree -- Pharyngeal -- Pharyngeal- Mechanical Soft -- Pharyngeal -- Pharyngeal- Regular -- Pharyngeal -- Pharyngeal- Multi-consistency -- Pharyngeal -- Pharyngeal- Pill -- Pharyngeal -- Pharyngeal Comment Pt is grossly aspirating secretions that mix with barium, HOB lowered to approx 45* did not improve transit into esophagus,  head turn right with honey via tsp inconsisently prevented aspiration-decreased retention, head turn left worsened swallow function with honey via tsp, cued supraglottic swallow did not prevent aspiration after swallow, aspiration occurs during swallow due to decreased laryngeal elevation *albeit minimally improved compared to prior*  and after due to pyriform sinus retention spilling into open airway without ability to clear with cued cough, on most swallows approximately 75% of boluses are entering into the esophagus which is an improvement compared to 8/6  CHL IP CERVICAL ESOPHAGEAL PHASE 11/14/2019 Cervical Esophageal Phase -- Pudding Teaspoon -- Pudding Cup -- Honey Teaspoon -- Honey Cup -- Nectar Teaspoon -- Nectar Cup -- Nectar Straw -- Thin Teaspoon -- Thin Cup -- Thin Straw -- Puree -- Mechanical Soft -- Regular -- Multi-consistency -- Pill -- Cervical Esophageal Comment appearance of severe stasis at pyriform sinus region Rolena Infante, MS [facility] SLP Acute Rehab Services Office [phone] Chales Abrahams 11/14/2019, 10:12 AM                Scheduled Meds: . Chlorhexidine Gluconate Cloth  6 each Topical Daily  . multivitamin  15 mL Per Tube Daily  . sennosides  5 mL Per Tube QHS  Continuous Infusions: . dextrose 5 % and 0.9 % NaCl with KCl 20 mEq/L 100 mL/hr at 11/15/19 0841  . heparin 1,600 Units/hr (11/15/19 1235)    Active Problems:   Subglottic stenosis     Pieter Partridge, [facility]  If 7PM-7AM, please contact night-coverage [email or web address] Password  [email or web address] 11/15/2019, 5:21 PM    LOS: 6 days

## 2019-11-15 NOTE — Progress Notes (Signed)
ANTICOAGULATION CONSULT NOTE  Pharmacy Consult for heparin Indication: pulmonary embolus  Allergies  Allergen Reactions  . Lisinopril Swelling    Angioedema    Patient Measurements: Height: 6\' 1"  (185.4 cm) Weight: 74.1 kg (163 lb 5.8 oz) IBW/kg (Calculated) : 79.9 Heparin Dosing Weight: 74kg  Vital Signs: Temp: 97.6 F (36.4 C) (08/10 1526) BP: 128/82 (08/10 1526) Pulse Rate: 81 (08/10 1551)  Labs: Recent Labs    11/14/19 2305 11/15/19 0850 11/15/19 1538  APTT 38* 86* 114*  HEPARINUNFRC 0.41 0.57 0.79*    Estimated Creatinine Clearance: 85.6 mL/min (by C-G formula based on SCr of 0.95 mg/dL).   Medical History: Past Medical History:  Diagnosis Date  . Aneurysm (HCC)   . ETOH abuse   . Hypertension   . Pancreatitis 03/04/2016  . Umbilical hernia    symptomatic umbilical hernia/notes 03/04/2016    Assessment: 59 year old male with acute PE on 7/25. Patient was on apixaban up until last night (last dose 8/8 pm) and he was made NPO and is pending PEG placement by surgery (IR was not able to do).   New orders 8/9 to transition to heparin until able to place/use a PEG tube.  Will use aptt to monitor and adjust heparin due to recent apixaban use.   Heparin level and aptt above goal on recheck this evening, will adjust. No bleeding issues noted.   OR for PEG scheduled for 815am tomorrow, per surgery note heparin is to be held 6 hours prior, will adjust timing on order to match.  Goal of Therapy:  Heparin level 0.3-0.7 units/ml aPTT 66-102 seconds Monitor platelets by anticoagulation protocol: Yes   Plan:  Reduce heparin to 1400 units/hr Aptt and heparin level prior to turning off at 0100 in case bridge would need to be restarted   10/9 PharmD., BCPS Clinical Pharmacist 11/15/2019 6:32 PM

## 2019-11-15 NOTE — Progress Notes (Signed)
Nutrition Follow-up  DOCUMENTATION CODES:   Not applicable  INTERVENTION:  Once PEG is placed and ready for use: -Continue Osmolite 1.5 cal @ 59ml/hr -75ml Prosource TF BID - free water Q6H   Tube feeding regimen provides 2240 kcal (100% of needs), 112 grams of protein, and 1894 ml of H2O.   NUTRITION DIAGNOSIS:   Inadequate oral intake related to inability to eat as evidenced by NPO status.  ongoing  GOAL:   Patient will meet greater than or equal to 90% of their needs  Will address with TF  MONITOR:   Diet advancement, TF tolerance, Skin, Weight trends, Labs, I & O's  REASON FOR ASSESSMENT:   Consult Enteral/tube feeding initiation and management  ASSESSMENT:   59yo male who presents with chief complaint of shortness of breath, on evaluation was found to have subglottic stenosis. Pt underwent emergent awake tracheostomy 8/4.  8/6 Cortrak placed (gastric) 8/9 failed MBS, Cortrak removed  Per MD, pt to have PEG placed, likely today.  Labs reviewed. Medications: MVI, Senokot  Diet Order:   Diet Order            Diet NPO time specified  Diet effective now                 EDUCATION NEEDS:   Not appropriate for education at this time  Skin:  Skin Assessment: Skin Integrity Issues: Skin Integrity Issues:: Incisions Incisions: neck  Last BM:  8/9  Height:   Ht Readings from Last 1 Encounters:  11/10/19 6\' 1"  (1.854 m)    Weight:   Wt Readings from Last 1 Encounters:  11/13/19 74.1 kg    BMI:  Body mass index is 21.55 kg/m.  Estimated Nutritional Needs:   Kcal:  2200-2400  Protein:  110-120 grams  Fluid:  >/= 2 L/day    01/13/20, MS, RD, LDN RD pager number and weekend/on-call pager number located in Amion.

## 2019-11-15 NOTE — Progress Notes (Signed)
Speech Language Pathology Treatment: Dysphagia  Patient Details Name: Marco Pittman MRN: [id number] DOB: 1961-03-04 Today's Date: 11/15/2019 Time: 1443-1540 SLP Time Calculation (min) (ACUTE ONLY): 41 min  Assessment / Plan / Recommendation Clinical Impression  Today pt seen for skilled SLP with efforts to progress with PMSV usage and initiation of swallowing exercise *lingual press to maximize hyolaryngeal elevation and thus airway protection with swallowing.  Initially pt required min verbal/visual cues to perform adequately but this waned to mod I after just a few trials.  Provided pt written instructions for exercises.  Did not see pt with ice chips or water due to his NPO status for possible PEG today. SLP continues to endorse plan for PEG due to pt's ongoing symptoms of severe dysphagia. Note MRI of brain was negative - ? Source of severe dysphagia.     PMSV goals addressed, SLP noted pt keeps valve in his bed, therefore SLP placed the PMSV line extension to allow valve to hand from his trach collar for ease of his access and to decrease loss risk.  Copious white tinged viscous secretions expectorated from his trach during session - does not subjectively appear to be decreasing in amount.  RT arrived and SLP requested suctioning of pt to determine if would improve PMSV tolerance.    Pt able to don and doff valve independently.  He is able to phonate with PMSV in place however his voice is harsh and strained and he only tolerates the valve for less than 10 seconds without significant air retention.   Suspect amount of pt's secretions and severe subglottic stensosis contributes to poor PMSV tolerance and dysphagia.     Educated pt to need to listen for "puff of air" with removal of valve to indicate CO2 retention with min cues, pt able to indicate contraindication.  Also advised he remove it when he senses "tightness". Providing portable hand sanitizer, advised pt he would finger occlude  trach momentarily with a CLEAN finger only to voice a few words.  He demonstrated finger occulsion and verblized importance of hand being clean.     Pt is making good progress in understanding his dysphagia, initiating lingual press exercise and PMSV indication/contraindication.  Will continue speech/swallow therapy.  HPI HPI: 59 yo male smoker presented to ER 7/26 with worsening shortness of breath and chest discomfort. Found to have severe hypertension with SBP > 200.  CT chest showed b/l PE and RLL ASD with mucus plugging concerning for aspiration pneumonia. PMH: ETOH, HTN, Pancreatitis, Aneurysm. ETT 7/26-27; reintubated 7/27-29 due to angioedema. 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 Seen 11/03/19 post extubation with regular and nectar thick recommended and upgraded to thin liquids the following day.        SLP Plan  Continue with current plan of care;Other (Comment) (pt for PEG for nutrition)       Recommendations  Diet recommendations:  (ice chips and tsps water after mouth care when MD approves) Supervision: Patient able to self feed      Patient may use Passy-Muir Speech Valve:  (pt able to don and remove independently) PMSV Supervision: Intermittent         Oral Care Recommendations: Oral care QID Follow up Recommendations: Other (comment) (tbd) SLP Visit Diagnosis: Dysphagia, pharyngoesophageal phase (R13.14);Aphonia (R49.1) Plan: Continue with current plan of care;Other (Comment) (pt for PEG for nutrition)  GO                Chales Abrahams 11/15/2019, 10:06 AM   Rolena Infante, MS [facility] SLP Acute Rehab Services Office [phone]

## 2019-11-15 NOTE — Progress Notes (Signed)
ANTICOAGULATION CONSULT NOTE - Initial Consult  Pharmacy Consult for heparin Indication: pulmonary embolus  Allergies  Allergen Reactions  . Lisinopril Swelling    Angioedema    Patient Measurements: Height: 6\' 1"  (185.4 cm) Weight: 74.1 kg (163 lb 5.8 oz) IBW/kg (Calculated) : 79.9 Heparin Dosing Weight: 74kg  Vital Signs: Temp: 99.3 F (37.4 C) (08/10 0733) BP: 123/80 (08/10 0733) Pulse Rate: 82 (08/10 0757)  Labs: Recent Labs    11/14/19 2305 11/15/19 0850  APTT 38* 86*  HEPARINUNFRC 0.41 0.57    Estimated Creatinine Clearance: 85.6 mL/min (by C-G formula based on SCr of 0.95 mg/dL).   Medical History: Past Medical History:  Diagnosis Date  . Aneurysm (HCC)   . ETOH abuse   . Hypertension   . Pancreatitis 03/04/2016  . Umbilical hernia    symptomatic umbilical hernia/notes 03/04/2016    Assessment: 59 year old male with acute PE on 7/25. Patient was on apixaban up until last night (last dose 8/8 pm) and he was made NPO and is pending PEG placement by surgery (IR was not able to do).   New orders to transition to heparin until able to place/use a PEG tube. CBC within normal limits on 8/5. Will use aptt to monitor and adjust heparin due to recent apixaban use.   Heparin level 0.57 and aPTT 86 - within goal range.  Goal of Therapy:  Heparin level 0.3-0.7 units/ml aPTT 66-102 seconds Monitor platelets by anticoagulation protocol: Yes   Plan:  Continue heparin at 1600 units/hr Monitor HL and aPTT and CBC daily  10/5, PharmD, [facility] Clinical Pharmacist Please see AMION for all Pharmacists' Contact Phone Numbers 11/15/2019, 11:19 AM

## 2019-11-16 ENCOUNTER — Inpatient Hospital Stay (HOSPITAL_COMMUNITY): Payer: Medicaid Other | Admitting: Certified Registered"

## 2019-11-16 ENCOUNTER — Encounter (HOSPITAL_COMMUNITY)
Admission: EM | Disposition: A | Discharge status: Home Health | Payer: Self-pay | Source: Home / Self Care | Attending: Internal Medicine

## 2019-11-16 DIAGNOSIS — Z9889 Other specified postprocedural states: Secondary | ICD-10-CM | POA: Diagnosis not present

## 2019-11-16 DIAGNOSIS — I2782 Chronic pulmonary embolism: Secondary | ICD-10-CM

## 2019-11-16 DIAGNOSIS — I10 Essential (primary) hypertension: Secondary | ICD-10-CM

## 2019-11-16 DIAGNOSIS — J386 Stenosis of larynx: Secondary | ICD-10-CM | POA: Diagnosis not present

## 2019-11-16 DIAGNOSIS — K861 Other chronic pancreatitis: Secondary | ICD-10-CM

## 2019-11-16 HISTORY — PX: GASTROSTOMY: SHX5249

## 2019-11-16 LAB — BASIC METABOLIC PANEL
Anion gap: 10 (ref 5–15)
BUN: 9 mg/dL (ref 8–23)
CO2: 22 mmol/L (ref 22–32)
Calcium: 8.8 mg/dL — ABNORMAL LOW (ref 8.9–10.3)
Chloride: 105 mmol/L (ref 98–111)
Creatinine, Ser: 0.95 mg/dL (ref 0.61–1.24)
GFR calc Af Amer: >60 mL/min (ref >60)
GFR calc non Af Amer: >60 mL/min (ref >60)
Glucose, Bld: 157 mg/dL — ABNORMAL HIGH (ref 70–99)
Potassium: 4.7 mmol/L (ref 3.5–5.1)
Sodium: 137 mmol/L (ref 135–145)

## 2019-11-16 LAB — GLUCOSE, CAPILLARY
Glucose-Capillary: 109 mg/dL — ABNORMAL HIGH (ref 70–99)
Glucose-Capillary: 111 mg/dL — ABNORMAL HIGH (ref 70–99)
Glucose-Capillary: 164 mg/dL — ABNORMAL HIGH (ref 70–99)
Glucose-Capillary: 167 mg/dL — ABNORMAL HIGH (ref 70–99)
Glucose-Capillary: 128 mg/dL — ABNORMAL HIGH (ref 70–99)

## 2019-11-16 LAB — APTT: aPTT: 52 seconds — ABNORMAL HIGH (ref 24–36)

## 2019-11-16 LAB — HEPARIN LEVEL (UNFRACTIONATED): Heparin Unfractionated: 0.32 IU/mL (ref 0.30–0.70)

## 2019-11-16 SURGERY — INSERTION OF GASTROSTOMY TUBE
Anesthesia: General | Site: Abdomen

## 2019-11-16 MED ORDER — LACTATED RINGERS IV SOLN: INTRAVENOUS | Status: DC | PRN

## 2019-11-16 MED ORDER — LIDOCAINE 2% (20 MG/ML) 5 ML SYRINGE
INTRAMUSCULAR | Status: AC
  Filled 2019-11-16: qty 5

## 2019-11-16 MED ORDER — OSMOLITE 1.5 CAL PO LIQD
1000.0000 mL | ORAL | Status: DC
  Administered 2019-11-16 – 2019-11-28 (×13): 1000 mL
  Filled 2019-11-16 (×25): qty 1000

## 2019-11-16 MED ORDER — LIDOCAINE 2% (20 MG/ML) 5 ML SYRINGE
INTRAMUSCULAR | Status: DC | PRN
  Administered 2019-11-16: 100 mg via INTRAVENOUS

## 2019-11-16 MED ORDER — MIDAZOLAM HCL 2 MG/2ML IJ SOLN
INTRAMUSCULAR | Status: DC | PRN
  Administered 2019-11-16: 2 mg via INTRAVENOUS

## 2019-11-16 MED ORDER — PHENYLEPHRINE 40 MCG/ML (10ML) SYRINGE FOR IV PUSH (FOR BLOOD PRESSURE SUPPORT)
PREFILLED_SYRINGE | INTRAVENOUS | Status: DC | PRN
  Administered 2019-11-16: 120 ug via INTRAVENOUS

## 2019-11-16 MED ORDER — PROPOFOL 10 MG/ML IV BOLUS
INTRAVENOUS | Status: AC
  Filled 2019-11-16: qty 20

## 2019-11-16 MED ORDER — ONDANSETRON HCL 4 MG/2ML IJ SOLN
INTRAMUSCULAR | Status: AC
  Filled 2019-11-16: qty 2

## 2019-11-16 MED ORDER — HEPARIN (PORCINE) 25000 UT/250ML-% IV SOLN
1400.0000 [IU]/h | INTRAVENOUS | Status: DC
  Administered 2019-11-16: 1400 [IU]/h via INTRAVENOUS
  Filled 2019-11-16: qty 250

## 2019-11-16 MED ORDER — DEXAMETHASONE SODIUM PHOSPHATE 10 MG/ML IJ SOLN
INTRAMUSCULAR | Status: AC
  Filled 2019-11-16: qty 1

## 2019-11-16 MED ORDER — OXYCODONE HCL 5 MG PO TABS: 5.0000 mg | ORAL_TABLET | Freq: Once | ORAL | Status: DC | PRN

## 2019-11-16 MED ORDER — DEXAMETHASONE SODIUM PHOSPHATE 10 MG/ML IJ SOLN
INTRAMUSCULAR | Status: DC | PRN
  Administered 2019-11-16: 10 mg via INTRAVENOUS

## 2019-11-16 MED ORDER — PROPOFOL 500 MG/50ML IV EMUL
INTRAVENOUS | Status: DC | PRN
  Administered 2019-11-16: 250 ug/kg/min via INTRAVENOUS

## 2019-11-16 MED ORDER — ONDANSETRON HCL 4 MG/2ML IJ SOLN
INTRAMUSCULAR | Status: DC | PRN
Start: 2019-11-16 — End: 2019-11-16
  Administered 2019-11-16: 4 mg via INTRAVENOUS

## 2019-11-16 MED ORDER — MIDAZOLAM HCL 2 MG/2ML IJ SOLN
INTRAMUSCULAR | Status: AC
  Filled 2019-11-16: qty 2

## 2019-11-16 MED ORDER — OXYCODONE HCL 5 MG/5ML PO SOLN: 5.0000 mg | Freq: Once | ORAL | Status: DC | PRN

## 2019-11-16 MED ORDER — EPHEDRINE SULFATE-NACL 50-0.9 MG/10ML-% IV SOSY
PREFILLED_SYRINGE | INTRAVENOUS | Status: DC | PRN
  Administered 2019-11-16: 10 mg via INTRAVENOUS

## 2019-11-16 MED ORDER — PROPOFOL 10 MG/ML IV BOLUS
INTRAVENOUS | Status: DC | PRN
  Administered 2019-11-16: 20 mg via INTRAVENOUS

## 2019-11-16 MED ORDER — ROCURONIUM BROMIDE 10 MG/ML (PF) SYRINGE
PREFILLED_SYRINGE | INTRAVENOUS | Status: AC
  Filled 2019-11-16: qty 10

## 2019-11-16 MED ORDER — FENTANYL CITRATE (PF) 100 MCG/2ML IJ SOLN: 25.0000 ug | INTRAMUSCULAR | Status: DC | PRN

## 2019-11-16 MED ORDER — PROSOURCE TF PO LIQD
45.0000 mL | Freq: Two times a day (BID) | ORAL | Status: DC
  Administered 2019-11-16 – 2019-12-05 (×37): 45 mL
  Filled 2019-11-16 (×37): qty 45

## 2019-11-16 MED ORDER — FENTANYL CITRATE (PF) 250 MCG/5ML IJ SOLN
INTRAMUSCULAR | Status: DC | PRN
  Administered 2019-11-16 (×5): 50 ug via INTRAVENOUS

## 2019-11-16 MED ORDER — FENTANYL CITRATE (PF) 250 MCG/5ML IJ SOLN
INTRAMUSCULAR | Status: AC
  Filled 2019-11-16: qty 5

## 2019-11-16 MED ORDER — ONDANSETRON HCL 4 MG/2ML IJ SOLN: 4.0000 mg | Freq: Four times a day (QID) | INTRAMUSCULAR | Status: DC | PRN

## 2019-11-16 MED ORDER — FREE WATER
200.0000 mL | Freq: Four times a day (QID) | Status: DC
  Administered 2019-11-16 – 2019-12-05 (×55): 200 mL

## 2019-11-16 SURGICAL SUPPLY — 43 items
BINDER ABDOMINAL 12 ML 46-62 (SOFTGOODS) ×3 IMPLANT
CANISTER SUCT 3000ML PPV (MISCELLANEOUS) ×3 IMPLANT
CATH GASTROSTOMY 20FR (CATHETERS) IMPLANT
CELLS DAT CNTRL 66122 CELL SVR (MISCELLANEOUS) IMPLANT
CHLORAPREP W/TINT 26 (MISCELLANEOUS) ×3 IMPLANT
COVER SURGICAL LIGHT HANDLE (MISCELLANEOUS) ×3 IMPLANT
COVER WAND RF STERILE (DRAPES) ×3 IMPLANT
DRAPE LAPAROSCOPIC ABDOMINAL (DRAPES) ×3 IMPLANT
DRSG TELFA 3X8 NADH (GAUZE/BANDAGES/DRESSINGS) IMPLANT
ELECT CAUTERY BLADE 6.4 (BLADE) ×3 IMPLANT
ELECT REM PT RETURN 9FT ADLT (ELECTROSURGICAL) ×3
ELECTRODE REM PT RTRN 9FT ADLT (ELECTROSURGICAL) ×1 IMPLANT
GAUZE SPONGE 4X4 12PLY STRL (GAUZE/BANDAGES/DRESSINGS) ×3 IMPLANT
GLOVE BIO SURGEON STRL SZ 6.5 (GLOVE) ×2 IMPLANT
GLOVE BIO SURGEONS STRL SZ 6.5 (GLOVE) ×1
GLOVE BIOGEL PI IND STRL 6 (GLOVE) ×1 IMPLANT
GLOVE BIOGEL PI INDICATOR 6 (GLOVE) ×2
GOWN STRL REUS W/ TWL LRG LVL3 (GOWN DISPOSABLE) ×1 IMPLANT
GOWN STRL REUS W/TWL LRG LVL3 (GOWN DISPOSABLE) ×2
HANDLE SUCTION POOLE (INSTRUMENTS) IMPLANT
KIT BASIN OR (CUSTOM PROCEDURE TRAY) ×3 IMPLANT
KIT TUBE JEJUNAL 16FR (CATHETERS) IMPLANT
KIT TURNOVER KIT B (KITS) ×3 IMPLANT
NS IRRIG 1000ML POUR BTL (IV SOLUTION) ×3 IMPLANT
PACK GENERAL/GYN (CUSTOM PROCEDURE TRAY) ×3 IMPLANT
PAD ARMBOARD 7.5X6 YLW CONV (MISCELLANEOUS) ×6 IMPLANT
PENCIL SMOKE EVACUATOR (MISCELLANEOUS) ×3 IMPLANT
PLUG CATH AND CAP STER (CATHETERS) IMPLANT
RTRCTR WOUND ALEXIS 18CM MED (MISCELLANEOUS)
RTRCTR WOUND ALEXIS 18CM SML (INSTRUMENTS)
SAVER CELL AAL HAEMONETICS (INSTRUMENTS) IMPLANT
SPONGE DRAIN TRACH 4X4 STRL 2S (GAUZE/BANDAGES/DRESSINGS) IMPLANT
SUCTION POOLE HANDLE (INSTRUMENTS)
SUT PDS AB 1 CT  36 (SUTURE) ×2
SUT PDS AB 1 CT 36 (SUTURE) ×1 IMPLANT
SUT PROLENE 2 0 CT2 30 (SUTURE) IMPLANT
SUT SILK 2 0 SH (SUTURE) ×6 IMPLANT
SUT SILK 2 0 SH CR/8 (SUTURE) ×3 IMPLANT
SYR 10ML LL (SYRINGE) ×3 IMPLANT
SYRINGE TOOMEY DISP (SYRINGE) ×3 IMPLANT
TOWEL GREEN STERILE (TOWEL DISPOSABLE) ×3 IMPLANT
TOWEL GREEN STERILE FF (TOWEL DISPOSABLE) ×3 IMPLANT
TUBE J 18FR (TUBING) IMPLANT

## 2019-11-16 NOTE — Progress Notes (Signed)
PROGRESS NOTE  Marco Pittman GBT:[id number] DOB: 09/17/1960   PCP: Marcine Matar, MD  Patient is from: Home  DOA: 11/09/2019 LOS: 7  Brief Narrative / Interim history: 59 y.o.M with HTN, recent angioedema due to ACEi requiring intubation, recent acute PE and aspiration pneumonia in July 2021 requiring re-intubation, as well as chronic alcohol use and hx pancreatitis who presented with wheezing/stridor. Dr. [name] from ENT emergently consulted in the ER, performed bedside laryngoscopy that showed glottic stenosis and took the patient for tracheostomy on 8/4.  Patient had MBS on 8/6 and 8/9 that revealed severe dysphagia.  He had PEG tube placed on 11/16/2019.  Started on tube feed.  Patient may be need to be monitored for refeeding syndrome over the next 2 to 3 days.  Pulmonology to arrange outpatient follow-up in trach clinic.  DME and supplies ordered by pulmonology.  Subjective: Seen and examined after he returned from PEG tube placement.  He was sitting on the edge of the bed.  Seems to have some significant secretion.  No complaints.  Objective: Vitals:   11/16/19 1000 11/16/19 1011 11/16/19 1026 11/16/19 1133  BP: 103/65 101/72 117/79   Pulse: 87 83 81 84  Resp: 15 (!) 21 16 18   Temp:   98.6 F (37 C)   TempSrc:      SpO2: 97% 99%  99%  Weight:      Height:        Intake/Output Summary (Last 24 hours) at 11/16/2019 1426 Last data filed at 11/16/2019 1300 Gross per 24 hour  Intake 3218.23 ml  Output 2055 ml  Net 1163.23 ml   Filed Weights   11/11/19 0656 11/12/19 0630 11/13/19 0403  Weight: 74.3 kg 74.3 kg 74.1 kg    Examination:  GENERAL: No apparent distress.  Nontoxic. HEENT: MMM.  Vision and hearing grossly intact.  NECK: Supple.  Tracheostomy. RESP: On trach.  No IWOB.  Rhonchi bilaterally. CVS:  RRR. Heart sounds normal.  ABD/GI/GU: BS+. Abd soft, NTND.  MSK/EXT:  Moves extremities. No apparent deformity. No edema.  SKIN: no apparent skin lesion or  wound NEURO: Awake, alert and oriented appropriately.  No apparent focal neuro deficit. PSYCH: Calm. Normal affect.  Procedures:   8/4 Tracheostomy by ENT  8/6 MBS-- severe dysphagia  8/9 MBS repeat -- severe dysphagia, unchanged  8/11 PEG tube placement by general surgery  Microbiology summarized: COVID-19 PCR negative.  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 01-06-1971, ENT on 8/4 -Tolerating trach.  We will continue trach care Home Trach / Discharge Needs per pulmonology -DME ordered for discharge: #6 shiley cuffless trach with back up #4 cuffless trach, suction, trach supplies (gauze, cleaning supplies), PMV -outpatient SLP follow up  -Follow up in trach clinic with 10/4, ACNP [phone] (called and left message for appt) -Outpatient follow-up with ENT  Recent PE in 10/2019 -IV heparin tonight.  We will switch back to Eliquis 80/12  Severe oropharyngeal dysphagia-MBS with severe dysphagia x2.  MRI brain without acute finding. -PEG tube placed 8/11 -Start tube feed per dietitian -Monitor for refeeding syndrome   Essential hypertension: Normotensive off amlodipine and carvedilol Continue as needed labetalol or hydralazine for severe range pressures  Alcohol use disorder: No evidence of withdrawal -Continue MVI, folate  Homelessness?  For TOC, lives with family.   Body mass index is 21.55 kg/m.  Nutrition Problem: Inadequate oral intake Etiology: inability to eat Signs/Symptoms: NPO status Interventions: Tube feeding   DVT prophylaxis:  On IV heparin for PE  Code Status: Full code Family Communication: Patient and/or RN. Available if any question.  Status is: Inpatient  Remains inpatient appropriate because:Inpatient level of care appropriate due to severity of illness  pending tube feed tolerance.  Also need monitoring for refeeding syndrome   Dispo: The patient is from: Home              Anticipated d/c is to: Home              Anticipated d/c date is: 3 days              Patient currently is not medically stable to d/c.       Consultants:  ENT Pulmonology General surgery   Sch Meds:  Scheduled Meds: . Chlorhexidine Gluconate Cloth  6 each Topical Daily  . feeding supplement (PROSource TF)  45 mL Per Tube BID  . free water  200 mL Per Tube Q6H  . multivitamin  15 mL Per Tube Daily  . sennosides  5 mL Per Tube QHS   Continuous Infusions: . dextrose 5 % and 0.9 % NaCl with KCl 20 mEq/L 100 mL/hr at 11/15/19 1847  . feeding supplement (OSMOLITE 1.5 CAL)    . heparin     PRN Meds:.hydrALAZINE, ipratropium-albuterol, labetalol, ondansetron (ZOFRAN) IV  Antimicrobials: Anti-infectives (From admission, onward)   None       I have personally reviewed the following labs and images: CBC: Recent Labs  Lab 11/09/19 1500 11/09/19 1551 11/09/19 1829 11/10/19 0414  WBC 5.4  --  9.1 6.5  NEUTROABS 3.1  --   --   --   HGB 14.0 15.0 13.5 13.6  HCT 40.8 44.0 38.8* 38.0*  MCV 90.1  --  89.6 89.6  PLT 308  --  301 360   BMP &GFR Recent Labs  Lab 11/09/19 1500 11/09/19 1551 11/09/19 1829 11/10/19 0414  NA 141 142  --  140  K 3.8 3.9  --  4.1  CL 102 101  --  102  CO2 29  --   --  26  GLUCOSE 104* 108*  --  149*  BUN 6* 8  --  11  CREATININE 0.71 0.70 0.93 0.95  CALCIUM 9.1  --   --  8.9  MG  --   --   --  2.0  PHOS  --   --   --  4.0   Estimated Creatinine Clearance: 85.6 mL/min (by C-G formula based on SCr of 0.95 mg/dL). Liver & Pancreas: Recent Labs  Lab 11/09/19 1500  AST 20  ALT 30  ALKPHOS 70  BILITOT 0.3  PROT 7.2  ALBUMIN 3.2*   No results for input(s): LIPASE, AMYLASE in the last 168 hours. No results for input(s): AMMONIA in the last 168 hours. Diabetic: No results for input(s): HGBA1C in the last 72  hours. Recent Labs  Lab 11/15/19 1628 11/15/19 1908 11/15/19 2316 11/16/19 0317 11/16/19 1111  GLUCAP 101* 89 94 109* 111*   Cardiac Enzymes: No results for input(s): CKTOTAL, CKMB, CKMBINDEX, TROPONINI in the last 168 hours. No results for input(s): PROBNP in the last 8760 hours. Coagulation Profile: No results for input(s): INR, PROTIME in the last 168 hours. Thyroid Function Tests: No results for input(s): TSH, T4TOTAL, FREET4, T3FREE, THYROIDAB in the last 72 hours. Lipid Profile: No results for input(s):  CHOL, HDL, LDLCALC, TRIG, CHOLHDL, LDLDIRECT in the last 72 hours. Anemia Panel: No results for input(s): VITAMINB12, FOLATE, FERRITIN, TIBC, IRON, RETICCTPCT in the last 72 hours. Urine analysis:    Component Value Date/Time   COLORURINE AMBER (A) 03/04/2016 1221   APPEARANCEUR HAZY (A) 03/04/2016 1221   APPEARANCEUR Cloudy (A) 10/06/2012 1452   LABSPEC 1.024 03/04/2016 1221   PHURINE 5.5 03/04/2016 1221   GLUCOSEU 100 (A) 03/04/2016 1221   HGBUR NEGATIVE 03/04/2016 1221   BILIRUBINUR SMALL (A) 03/04/2016 1221   BILIRUBINUR Negative 10/06/2012 1452   KETONESUR 15 (A) 03/04/2016 1221   PROTEINUR 30 (A) 03/04/2016 1221   NITRITE POSITIVE (A) 03/04/2016 1221   LEUKOCYTESUR SMALL (A) 03/04/2016 1221   LEUKOCYTESUR 1+ (A) 10/06/2012 1452   Sepsis Labs: Invalid input(s): PROCALCITONIN, LACTICIDVEN  Microbiology: Recent Results (from the past 240 hour(s))  SARS Coronavirus 2 by RT PCR (hospital order, performed in [facility] hospital lab) Nasopharyngeal Nasopharyngeal Swab     Status: None   Collection Time: 11/09/19  3:35 PM   Specimen: Nasopharyngeal Swab  Result Value Ref Range Status   SARS Coronavirus 2 NEGATIVE NEGATIVE Final    Comment: (NOTE) SARS-CoV-2 target nucleic acids are NOT DETECTED.  The SARS-CoV-2 RNA is generally detectable in upper and lower respiratory specimens during the acute phase of infection. The lowest concentration of SARS-CoV-2 viral  copies this assay can detect is 250 copies / mL. A negative result does not preclude SARS-CoV-2 infection and should not be used as the sole basis for treatment or other patient management decisions.  A negative result may occur with improper specimen collection / handling, submission of specimen other than nasopharyngeal swab, presence of viral mutation(s) within the areas targeted by this assay, and inadequate number of viral copies (<250 copies / mL). A negative result must be combined with clinical observations, patient history, and epidemiological information.  Fact Sheet for Patients:   BoilerBrush.com.cy  Fact Sheet for Healthcare Providers: https://pope.com/  This test is not yet approved or  cleared by the [place] FDA and has been authorized for detection and/or diagnosis of SARS-CoV-2 by FDA under an Emergency Use Authorization (EUA).  This EUA will remain in effect (meaning this test can be used) for the duration of the COVID-19 declaration under Section 564(b)(1) of the Act, 21 U.S.C. section 360bbb-3(b)(1), unless the authorization is terminated or revoked sooner.  Performed at [facility], [address], [place], [place] [address]     Radiology Studies: No results found.    Kimbra Marcelino T. Axyl Sitzman [facility]  If 7PM-7AM, please contact night-coverage [email or web address] Password [facility] 11/16/2019, 2:26 PM

## 2019-11-16 NOTE — TOC Progression Note (Addendum)
Transition of Care [facility]) - Progression Note    Patient Details  Name: Marco Pittman MRN: [id number] Date of Birth: Dec 12, 1960  Transition of Care [facility] CM/SW Contact  Joanne Chars, LCSW Phone Number: 11/16/2019, 3:26 PM  Clinical Narrative:   Edwyna Ready with [employer] has orders for trach equipment.  CSW was able to reach [name], who came to the hospital and met with respiratory to get trained on the trach.  She also asked to speak with CSW and reported that pt is essentially homeless and has been sleeping in his [place] most of the time.  [name] has a home and pt has stayed there before but does not live there.  Joycelyn Schmid is working with a Cabin crew on a potential apartment for pt, but pt has no income and they are trying to make some arrangements. CSW also inquired about whether pt has legal guardian and Joycelyn Schmid reports that he does not have a legal guardian. CSW also spoke with Beau Fanny, who said he would be available Thursday afternoon to meet and be trained on the trach.    Expected Discharge Plan: [place] Barriers to Discharge: Continued Medical Work up, Ship broker, Equipment Delay  Expected Discharge Plan and Services Expected Discharge Plan: [place] arrangements for the past 2 months: Single Family Home                                       Social Determinants of Health (SDOH) Interventions    Readmission Risk Interventions No flowsheet data found.

## 2019-11-16 NOTE — Anesthesia Preprocedure Evaluation (Signed)
Anesthesia Evaluation  Patient identified by MRN, date of birth, ID band Patient awake    Reviewed: Allergy & Precautions, H&P , NPO status , Patient's Chart, lab work & pertinent test results  Airway Mallampati: Trach   Neck ROM: full    Dental   Pulmonary pneumonia, Current Smoker,    breath sounds clear to auscultation       Cardiovascular hypertension,  Rhythm:regular Rate:Normal     Neuro/Psych    GI/Hepatic   Endo/Other    Renal/GU      Musculoskeletal   Abdominal   Peds  Hematology   Anesthesia Other Findings   Reproductive/Obstetrics                             Anesthesia Physical Anesthesia Plan  ASA: III  Anesthesia Plan: General   Post-op Pain Management:    Induction: Intravenous  PONV Risk Score and Plan: 1 and Ondansetron, Dexamethasone, Midazolam and Treatment may vary due to age or medical condition  Airway Management Planned: Tracheostomy  Additional Equipment:   Intra-op Plan:   Post-operative Plan: Possible Post-op intubation/ventilation  Informed Consent: I have reviewed the patients History and Physical, chart, labs and discussed the procedure including the risks, benefits and alternatives for the proposed anesthesia with the patient or authorized representative who has indicated his/her understanding and acceptance.       Plan Discussed with: CRNA, Anesthesiologist and Surgeon  Anesthesia Plan Comments:         Anesthesia Quick Evaluation

## 2019-11-16 NOTE — Transfer of Care (Signed)
Immediate Anesthesia Transfer of Care Note  Patient: Marco Pittman  Procedure(s) Performed: INSERTION OF GASTROSTOMY TUBE, (N/A Abdomen)  Patient Location: PACU  Anesthesia Type:MAC  Level of Consciousness: drowsy and patient cooperative  Airway & Oxygen Therapy: Patient Spontanous Breathing and Patient connected to tracheostomy mask oxygen  Post-op Assessment: Report given to RN and Post -op Vital signs reviewed and stable  Post vital signs: Reviewed and stable  Last Vitals:  Vitals Value Taken Time  BP 94/62 11/16/19 0943  Temp    Pulse 82 11/16/19 0944  Resp 17 11/16/19 0944  SpO2 97 % 11/16/19 0944  Vitals shown include unvalidated device data.  Last Pain:  Vitals:   11/15/19 2314  TempSrc: Oral  PainSc: 0-No pain      Patients Stated Pain Goal: 0 (76/54/65 0354)  Complications: No complications documented.

## 2019-11-16 NOTE — Progress Notes (Addendum)
Tracheal education done with the patient and his sister.I gave Marco Pittman a tracheal education book. I explained sterile suction technique and demonstrated with patient. Cleaned stoma and applied new gauze.  Explained the inner cannula and how to take it out. Showed them how to lock inner cannula in place. I explained to check his inner cannula if it was hard to breath. I showed them the obturator and how it should be used to replace the trach. Explained in an emergency to call 911.

## 2019-11-16 NOTE — Progress Notes (Signed)
ANTICOAGULATION CONSULT NOTE  Pharmacy Consult for heparin Indication: pulmonary embolus  Allergies  Allergen Reactions  . Lisinopril Swelling    Angioedema    Patient Measurements: Height: 6\' 1"  (185.4 cm) Weight: 74.1 kg (163 lb 5.8 oz) IBW/kg (Calculated) : 79.9 Heparin Dosing Weight: 74kg  Vital Signs: Temp: 98.6 F (37 C) (08/11 1026) BP: 117/79 (08/11 1026) Pulse Rate: 84 (08/11 1133)  Labs: Recent Labs    11/15/19 0850 11/15/19 1538 11/16/19 0318  APTT 86* 114* 52*  HEPARINUNFRC 0.57 0.79* 0.32    Estimated Creatinine Clearance: 85.6 mL/min (by C-G formula based on SCr of 0.95 mg/dL).  Assessment: 59 year old male with acute PE on 7/25.   Now s/p PEG - to resume heparin   Goal of Therapy:  Heparin level 0.3-0.7 units/ml aPTT 66-102 seconds Monitor platelets by anticoagulation protocol: Yes   Plan:  Resume heparin 1400 units/hr at 1600 aPTT 0000 Daily HL CBC F/U return to apixaban 8/12  10/12, PharmD, BCPS, BCCCP Clinical Pharmacist [phone]  Please check AMION for all [facility] Pharmacy numbers  11/16/2019 1:05 PM

## 2019-11-16 NOTE — Progress Notes (Signed)
Trauma/Critical Care Follow Up Note  Subjective:    Overnight Issues:   Objective:  Vital signs for last 24 hours: Temp:  [97.6 F (36.4 C)-99.3 F (37.4 C)] 98.2 F (36.8 C) (08/10 2314) Pulse Rate:  [69-83] 72 (08/11 0411) Resp:  [16-22] 20 (08/11 0411) BP: (123-128)/(74-82) 128/74 (08/10 2314) SpO2:  [93 %-100 %] 93 % (08/11 0411) FiO2 (%):  [28 %] 28 % (08/11 0411)  Hemodynamic parameters for last 24 hours:    Intake/Output from previous day: 08/10 0701 - 08/11 0700 In: 3018.2 [I.V.:3018.2] Out: 2050 [Urine:2050]  Intake/Output this shift: No intake/output data recorded.  Vent settings for last 24 hours: FiO2 (%):  [28 %] 28 %  Physical Exam:  Gen: comfortable, no distress Neuro: non-focal exam HEENT: PERRL Neck: supple CV: RRR Pulm: unlabored breathing Abd: soft, NT GU: clear yellow urine Extr: wwp, no edema   Results for orders placed or performed during the hospital encounter of 11/09/19 (from the past 24 hour(s))  Heparin level (unfractionated)     Status: None   Collection Time: 11/15/19  8:50 AM  Result Value Ref Range   Heparin Unfractionated 0.57 0.30 - 0.70 IU/mL  APTT     Status: Abnormal   Collection Time: 11/15/19  8:50 AM  Result Value Ref Range   aPTT 86 (H) 24 - 36 seconds  Glucose, capillary     Status: None   Collection Time: 11/15/19 11:36 AM  Result Value Ref Range   Glucose-Capillary 98 70 - 99 mg/dL  Heparin level (unfractionated)     Status: Abnormal   Collection Time: 11/15/19  3:38 PM  Result Value Ref Range   Heparin Unfractionated 0.79 (H) 0.30 - 0.70 IU/mL  APTT     Status: Abnormal   Collection Time: 11/15/19  3:38 PM  Result Value Ref Range   aPTT 114 (H) 24 - 36 seconds  Glucose, capillary     Status: Abnormal   Collection Time: 11/15/19  4:28 PM  Result Value Ref Range   Glucose-Capillary 101 (H) 70 - 99 mg/dL  Glucose, capillary     Status: None   Collection Time: 11/15/19  7:08 PM  Result Value Ref Range    Glucose-Capillary 89 70 - 99 mg/dL  Glucose, capillary     Status: None   Collection Time: 11/15/19 11:16 PM  Result Value Ref Range   Glucose-Capillary 94 70 - 99 mg/dL  Glucose, capillary     Status: Abnormal   Collection Time: 11/16/19  3:17 AM  Result Value Ref Range   Glucose-Capillary 109 (H) 70 - 99 mg/dL  APTT     Status: Abnormal   Collection Time: 11/16/19  3:18 AM  Result Value Ref Range   aPTT 52 (H) 24 - 36 seconds  Heparin level (unfractionated)     Status: None   Collection Time: 11/16/19  3:18 AM  Result Value Ref Range   Heparin Unfractionated 0.32 0.30 - 0.70 IU/mL    Assessment & Plan:  Present on Admission: . Subglottic stenosis    LOS: 7 days   Additional comments:I reviewed the patient's new clinical lab test results.   and I reviewed the patients new imaging test results.    HTN Recent PE on Apixaban - last dose PM 8/8 Recent angioedema after ACEi Hx of EtOH abuse Hx of chronic pancreatitis  Homelessness  Dysphagia - will plan for PEG vs open gastrostomy tube placement today. Informed consent was obtained after detailed explanation of risks,  including bleeding, infection, malposition, and need for conversion to open procedure. All questions answered to the patient's satisfaction.    Diamantina Monks, MD Trauma & General Surgery Please use [email or web address] to contact on call provider  11/16/2019  *Care during the described time interval was provided by me. I have reviewed this patient's available data, including medical history, events of note, physical examination and test results as part of my evaluation.

## 2019-11-16 NOTE — Progress Notes (Signed)
ENT Progress Note: Postoperative day seven after tracheostomy and laryngoscopy by Dr. [name]   Subjective: Patient in OR for placement of PEG tube   Objective: Vital signs in last 24 hours: Temp:  [97.6 F (36.4 C)-98.8 F (37.1 C)] 98.8 F (37.1 C) (08/11 0943) Pulse Rate:  [69-87] 87 (08/11 1000) Resp:  [15-22] 15 (08/11 1000) BP: (94-128)/(62-82) 103/65 (08/11 1000) SpO2:  [93 %-100 %] 97 % (08/11 1000) FiO2 (%):  [28 %] 28 % (08/11 0411) Weight change:  Last BM Date: 11/14/19  Intake/Output from previous day: 08/10 0701 - 08/11 0700 In: 3018.2 [I.V.:3018.2] Out: 2050 [Urine:2050] Intake/Output this shift: Total I/O In: 200 [I.V.:200] Out: 5 [Blood:5]  Labs: No results for input(s): WBC, HGB, HCT, PLT in the last 72 hours. No results for input(s): NA, K, CL, CO2, GLUCOSE, BUN, CALCIUM in the last 72 hours.  Invalid input(s): CREATININR  Studies/Results: CT ABDOMEN WO CONTRAST  Result Date: 11/14/2019 CLINICAL DATA:  59 year old with dysphagia. Evaluate anatomy for possible percutaneous gastrostomy tube placement. EXAM: CT ABDOMEN WITHOUT CONTRAST TECHNIQUE: Multidetector CT imaging of the abdomen was performed following the standard protocol without IV contrast. COMPARISON:  Chest CT 10/31/2019 and abdominal CT 02/25/2016 FINDINGS: Lower chest: Increased consolidation at the posterior left lower lobe. Again noted is volume loss and consolidation in the right lower lobe. Hepatobiliary: No acute abnormality to the liver or gallbladder. Pancreas: Scattered pancreatic calcifications suggestive for prior inflammation. Spleen: Normal in size without focal abnormality. Adrenals/Urinary Tract: Stable appearance of the adrenal glands. No definite adrenal lesions. Mild adrenal thickening is similar to the previous examination. Normal appearance of both kidneys without stones or hydronephrosis. Urinary bladder may be distended and appears to be partially visualized in the lower  abdomen. Stomach/Bowel: There is high-density contrast in the colon and small bowel loops in the anterior abdomen. Unfortunately, some of the small bowel loops are anterior to the gastric antrum region and could prohibit percutaneous gastric tube placement. No evidence for bowel obstruction. Vascular/Lymphatic: Atherosclerotic calcifications in the abdominal aorta and proximal iliac arteries. No significant lymph node enlargement in the abdomen. Other: Negative for ascites. Musculoskeletal: No acute bone abnormality. IMPRESSION: 1. Loops of small bowel are located between the anterior abdominal wall and the stomach. The small bowel loops may prohibit placement of a percutaneous gastrostomy tube. 2. Consolidation at the lung bases. Left base consolidation has progressed since the prior chest CT. Findings are concerning for pneumonia/aspiration and atelectasis. Electronically Signed   By: Richarda Overlie M.D.   On: 11/14/2019 12:00   MR BRAIN WO CONTRAST  Result Date: 11/14/2019 CLINICAL DATA:  Initial evaluation for neuro deficit, stroke suspected. EXAM: MRI HEAD WITHOUT CONTRAST TECHNIQUE: Multiplanar, multiecho pulse sequences of the brain and surrounding structures were obtained without intravenous contrast. COMPARISON:  Prior CT from 08/28/2007. FINDINGS: Brain: Mild age-related cerebral atrophy. Patchy T2/FLAIR hyperintensity within the periventricular white matter most consistent with chronic small vessel ischemic disease, mild for age. No abnormal foci of restricted diffusion to suggest acute or subacute ischemia. Gray-white matter differentiation maintained. No encephalomalacia to suggest chronic cortical infarction. No acute intracranial hemorrhage. Single chronic microhemorrhage noted at the posterior right frontal centrum semi ovale (series 24, image 33), of doubtful significance in isolation. No other foci of susceptibility artifact to suggest chronic intracranial hemorrhage. No mass lesion, midline shift  or mass effect. No hydrocephalus or extra-axial fluid collection. Pituitary gland suprasellar region normal. Midline structures intact. Vascular: Major intracranial vascular flow voids are maintained. Skull and  upper cervical spine: Craniocervical junction normal. Bone marrow signal intensity somewhat heterogeneous without focal marrow replacing lesion. No scalp soft tissue abnormality. Sinuses/Orbits: Globes and orbital soft tissues demonstrate no acute finding. Remote posttraumatic defects at the bilateral lamina papyracea noted. Paranasal sinuses are largely clear. Small bilateral mastoid effusions noted, of doubtful significance. Visualized nasopharynx within normal limits. Inner ear structures grossly normal. Other: None. IMPRESSION: 1. No acute intracranial abnormality. 2. Mild age-related cerebral atrophy with chronic small vessel ischemic disease. Electronically Signed   By: Rise Mu M.D.   On: 11/14/2019 23:18     PHYSICAL EXAM: Deferred   Assessment/Plan: Patient undergoing gastrostomy tube placement.  Airway stable by nurse report.  Continue trach teaching with anticipated discharge when patient comfortable and home situation stable.  Plan follow-up as an outpatient with Dr. 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    Osborn Coho 11/16/2019, 10:12 AM

## 2019-11-16 NOTE — Anesthesia Postprocedure Evaluation (Signed)
Anesthesia Post Note  Patient: Marco Pittman  Procedure(s) Performed: INSERTION OF GASTROSTOMY TUBE, (N/A Abdomen)     Patient location during evaluation: PACU Anesthesia Type: MAC Level of consciousness: awake and alert Pain management: pain level controlled Vital Signs Assessment: post-procedure vital signs reviewed and stable Respiratory status: spontaneous breathing, nonlabored ventilation, respiratory function stable and patient connected to nasal cannula oxygen Cardiovascular status: stable and blood pressure returned to baseline Postop Assessment: no apparent nausea or vomiting Anesthetic complications: no   No complications documented.  Last Vitals:  Vitals:   11/16/19 1026 11/16/19 1133  BP: 117/79   Pulse: 81 84  Resp: 16 18  Temp: 37 C   SpO2:  99%    Last Pain:  Vitals:   11/16/19 1100  TempSrc:   PainSc: 0-No pain                 Shalie Schremp S

## 2019-11-16 NOTE — Op Note (Signed)
   Procedure Note  Date: 11/16/2019  Procedure: esophagogastroduodenoscopy (EGD) and percutaneous endoscopic gastrostomy (PEG) tube placement  Pre-op diagnosis: dysphagia Post-op diagnosis: same  Indication and clinical history: 71M with dysphagia.  Surgeon: Jesusita Oka, MD Assistant: Gaylyn Lambert, [place]  Anesthesia: MAC  Findings: multiple small gastric polyps . Specimen: none . EBL: <5cc . Drains/Implants: PEG tube, 2cm at the skin   Disposition: ICU/PACU  Description of Procedure: The patient was positioned supine. Time-out was performed verifying correct patient, procedure, signature of informed consent, and pre-operative antibiotics as indicated. MAC induction was uneventful and a bite block was placed into the oropharynx. The endoscope was inserted into the oropharynx and advanced down the esophagus into the stomach and into the duodenum. The visualized esophagus and duodenum were unremarkable. The endoscope was retracted back into the stomach and the stomach was insufflated. The stomach was inspected and was notable for multiple small gastric polyps. Transillumination was performed. The light was visible on the external skin and dimpling of the stomach was noted endoscopically with manual pressure. The abdomen was prepped and draped in the usual sterile fashion. Transillumination and dimpling were repeated and local anesthetic was infiltrated to make a skin wheal at the site of transillumination. The needle was inserted perpendicularly to the skin and the tip of the needle was visualized endoscopically. As the needle was retracted, the tract was also anesthetized. A skin nick was made at the site of the wheal and an introducer needle and sheath were inserted. The needle was removed and guidewire inserted. The guidewire was grasped by an endoscopic snare and the snare, guidewire, and endoscope retracted out of the oropharynx. The PEG tube was secured to the guidewire and retracted  through the mouth and esophagus into the stomach. The PEG tube was secured with a bolster and was visualized endoscopically to spin freely circumferentially and also be without gaps between the internal bumper and the stomach wall. There was no evidence of bleeding. The PEG bolster was secured at 2cm at the skin and there were no gaps between the bolster and the abdominal wall. The stomach was desufflated endoscopically and the endoscope removed. The bite block was also removed. The patient tolerated the procedure well and there were no complications.   The patient may have water and medications administered via the PEG tube beginning immediately and tube feeds may be initiated four hours post-procedure. Heparin drip may be restarted today at 1600 and DOAC may be restarted 11/17/2019.    Jesusita Oka, MD General and [facility]

## 2019-11-17 ENCOUNTER — Encounter (HOSPITAL_COMMUNITY): Payer: Self-pay | Admitting: Surgery

## 2019-11-17 DIAGNOSIS — I10 Essential (primary) hypertension: Secondary | ICD-10-CM | POA: Diagnosis not present

## 2019-11-17 DIAGNOSIS — I2782 Chronic pulmonary embolism: Secondary | ICD-10-CM | POA: Diagnosis not present

## 2019-11-17 DIAGNOSIS — J386 Stenosis of larynx: Secondary | ICD-10-CM | POA: Diagnosis not present

## 2019-11-17 DIAGNOSIS — Z9889 Other specified postprocedural states: Secondary | ICD-10-CM | POA: Diagnosis not present

## 2019-11-17 LAB — RENAL FUNCTION PANEL
Albumin: 2.4 g/dL — ABNORMAL LOW (ref 3.5–5.0)
Anion gap: 9 (ref 5–15)
BUN: 10 mg/dL (ref 8–23)
CO2: 21 mmol/L — ABNORMAL LOW (ref 22–32)
Calcium: 8.7 mg/dL — ABNORMAL LOW (ref 8.9–10.3)
Chloride: 107 mmol/L (ref 98–111)
Creatinine, Ser: 0.84 mg/dL (ref 0.61–1.24)
GFR calc Af Amer: >60 mL/min (ref >60)
GFR calc non Af Amer: >60 mL/min (ref >60)
Glucose, Bld: 127 mg/dL — ABNORMAL HIGH (ref 70–99)
Phosphorus: 3.5 mg/dL (ref 2.5–4.6)
Potassium: 4.6 mmol/L (ref 3.5–5.1)
Sodium: 137 mmol/L (ref 135–145)

## 2019-11-17 LAB — CBC
HCT: 33.4 % — ABNORMAL LOW (ref 39.0–52.0)
Hemoglobin: 11.7 g/dL — ABNORMAL LOW (ref 13.0–17.0)
MCH: 31.2 pg (ref 26.0–34.0)
MCHC: 35 g/dL (ref 30.0–36.0)
MCV: 89.1 fL (ref 80.0–100.0)
Platelets: 296 10*3/uL (ref 150–400)
RBC: 3.75 MIL/uL — ABNORMAL LOW (ref 4.22–5.81)
RDW: 12.4 % (ref 11.5–15.5)
WBC: 7.2 10*3/uL (ref 4.0–10.5)
nRBC: 0 % (ref 0.0–0.2)

## 2019-11-17 LAB — GLUCOSE, CAPILLARY
Glucose-Capillary: 102 mg/dL — ABNORMAL HIGH (ref 70–99)
Glucose-Capillary: 113 mg/dL — ABNORMAL HIGH (ref 70–99)
Glucose-Capillary: 120 mg/dL — ABNORMAL HIGH (ref 70–99)
Glucose-Capillary: 130 mg/dL — ABNORMAL HIGH (ref 70–99)
Glucose-Capillary: 133 mg/dL — ABNORMAL HIGH (ref 70–99)
Glucose-Capillary: 140 mg/dL — ABNORMAL HIGH (ref 70–99)

## 2019-11-17 LAB — MAGNESIUM: Magnesium: 2.1 mg/dL (ref 1.7–2.4)

## 2019-11-17 LAB — APTT: aPTT: 74 seconds — ABNORMAL HIGH (ref 24–36)

## 2019-11-17 MED ORDER — APIXABAN 5 MG PO TABS: 5.0000 mg | ORAL_TABLET | Freq: Two times a day (BID) | ORAL | 3 refills | Status: DC

## 2019-11-17 MED ORDER — ADULT MULTIVITAMIN W/MINERALS CH: 1.0000 | ORAL_TABLET | Freq: Every day | ORAL | Status: AC

## 2019-11-17 MED ORDER — THIAMINE HCL 100 MG PO TABS: 100.0000 mg | ORAL_TABLET | Freq: Every day | ORAL | Status: AC

## 2019-11-17 MED ORDER — APIXABAN 5 MG PO TABS: 5.0000 mg | ORAL_TABLET | Freq: Two times a day (BID) | ORAL | Status: DC

## 2019-11-17 MED ORDER — OSMOLITE 1.5 CAL PO LIQD: ORAL | 11 refills | Status: DC

## 2019-11-17 MED ORDER — CARVEDILOL 3.125 MG PO TABS: 3.1250 mg | ORAL_TABLET | Freq: Two times a day (BID) | ORAL | 3 refills | Status: DC

## 2019-11-17 MED ORDER — PROSOURCE TF PO LIQD: 45.0000 mL | Freq: Two times a day (BID) | ORAL | 11 refills | Status: DC

## 2019-11-17 MED ORDER — APIXABAN 5 MG PO TABS
5.0000 mg | ORAL_TABLET | Freq: Two times a day (BID) | ORAL | Status: DC
  Administered 2019-11-17 – 2019-12-10 (×47): 5 mg
  Filled 2019-11-17 (×47): qty 1

## 2019-11-17 MED ORDER — GLYCOPYRROLATE 1 MG PO TABS
1.0000 mg | ORAL_TABLET | Freq: Two times a day (BID) | ORAL | Status: DC
  Administered 2019-11-17 – 2019-11-19 (×5): 1 mg via ORAL
  Filled 2019-11-17 (×5): qty 1

## 2019-11-17 MED ORDER — URSODIOL 500 MG PO TABS: 500.0000 mg | ORAL_TABLET | Freq: Three times a day (TID) | ORAL | Status: AC

## 2019-11-17 MED ORDER — POLYETHYLENE GLYCOL 3350 17 G PO PACK: 17.0000 g | PACK | Freq: Two times a day (BID) | ORAL | Status: DC | PRN

## 2019-11-17 NOTE — Progress Notes (Signed)
ANTICOAGULATION CONSULT NOTE  Pharmacy Consult for Eliquis Indication: pulmonary embolus  Allergies  Allergen Reactions   Lisinopril Swelling    Angioedema    Patient Measurements: Height: 6\' 1"  (185.4 cm) Weight: 74.1 kg (163 lb 5.8 oz) IBW/kg (Calculated) : 79.9 Heparin Dosing Weight: 74kg  Vital Signs: Temp: 98.4 F (36.9 C) (08/11 2110) Temp Source: Oral (08/11 2110) BP: 121/84 (08/11 2110) Pulse Rate: 82 (08/12 0408)  Labs: Recent Labs    11/15/19 0850 11/15/19 0850 11/15/19 1538 11/16/19 0318 11/16/19 1530 11/17/19 0034  HGB  --   --   --   --   --  11.7*  HCT  --   --   --   --   --  33.4*  PLT  --   --   --   --   --  296  APTT 86*   < > 114* 52*  --  74*  HEPARINUNFRC 0.57  --  0.79* 0.32  --   --   CREATININE  --   --   --   --  0.95 0.84   < > = values in this interval not displayed.    Estimated Creatinine Clearance: 96.8 mL/min (by C-G formula based on SCr of 0.84 mg/dL).  Assessment: 59 y.o. male with h/o recent PE 7/25 for Eliquis   Plan:  D/C heparin  Eliquis 5 mg BID  8/25, PharmD, BCPS

## 2019-11-17 NOTE — Progress Notes (Signed)
PEG placement 11/16/2019 - tube in good position, 2.5 at the skin. Abdominal binder in place at all times. If tube is accidentally removed before 11/23/2019, please call the surgeon on call immediately as this is a surgical emergency. Okay to use tube for meds, water flushes, and tube feeds without restriction from surgical perspective, however defer decision-making to primary team. Please call for any additional questions/concerns.    Diamantina Monks, MD General and Trauma Surgery [facility]

## 2019-11-17 NOTE — Progress Notes (Signed)
Speech Language Pathology Treatment: Dysphagia;Passy Muir Speaking valve  Patient Details Name: Marco Pittman MRN: [id number] DOB: Aug 29, 1960 Today's Date: 11/17/2019 Time: 9242-6834 SLP Time Calculation (min) (ACUTE ONLY): 22 min  Assessment / Plan / Recommendation Clinical Impression  Pt today lying in bed, underwent PEG yesterday.  Pt reports he has been completing exercise and he demonstrated said exercise to SLP without cues.  Encouraged pt to also conduct effortful swallows with intake of ice/ice chips.  Pt continues with delayed cough and expectoration of secretions with intake - which is due to his aspiration.  Given his cough is strong and he can ambulate, agree with continuing intake of thin water via cup*small amounts* and single ice chips AFTER mouth care.  Reviewed asp pna indications including increased resp effort, changing in color of secretions, fevers, etc with need to call MD at that time.  Advised if any s/s occur, he back off of water and consume only single small ice chips.  SLP clarified aspiration and tolerance definitions with pt.  Pt continues with copious secretions - uncertain to source?    PMSV goals addressed and pt able to don and remove independently.  Removal is required within a few secondary to pt feeling "tight".  Encouraged him to continue PMSV use prn and educated him to cleaning, reordering etc of valve.    Pt will be starting chemoradiation soon - encouraged pt to continue intake of ice/water throughout treatment to decrease disuse muscle atrophy and mitigate fibrosis.   Recommend HH follow up SLP primarily with focus on dysphagia- treatment including laryngeal elevation musculature. Repeat instrumental eval, MBS, advised prior to po administration due to pt's SILENT nature of aspiration. Pt consuming ice and small sip of water during session - swallows followed by delayed cough with secretion expectoration from trachea.  Secretions expelled from trach  range from very thin *water aspirated* to viscous.  Benefit of continuing water/ice po is hygeine, QOL, decr disuse muscle atrophy.    All education completed and pt to dc today.  Thanks for allowing me to help care for this most motivated and pleasant pt.    HPI HPI: 59 yo male smoker presented to ER 7/26 with worsening shortness of breath and chest discomfort. Found to have severe hypertension with SBP > 200.  CT chest showed b/l PE and RLL ASD with mucus plugging concerning for aspiration pneumonia. PMH: ETOH, HTN, Pancreatitis, Aneurysm. ETT 7/26-27; reintubated 7/27-29 due to angioedema. 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 Seen 11/03/19 post extubation with regular and nectar thick recommended and upgraded to thin liquids the following day.    Pt now with severe dysphagia noted on MBS testing x2.  His trach is now changed to a number 6 cuffless and thus he is able to move air around his trach and speak for a few seconds before breathstacking noted.      SLP Plan  Continue with current plan of care;Other (Comment) (pt for PEG for nutrition)       Recommendations  Liquids provided via: Cup Medication Administration: Via alternative means Supervision: Patient able to self feed Compensations: Small sips/bites Postural Changes and/or Swallow Maneuvers: Seated upright 90 degrees      Patient may use Passy-Muir Speech Valve:  (pt able to don and remove independently) PMSV Supervision: Intermittent         Oral Care Recommendations: Oral care  QID Follow up Recommendations: Other (comment) (tbd) SLP Visit Diagnosis: Dysphagia, pharyngoesophageal phase (R13.14);Aphonia (R49.1) Plan: Continue with current plan of care;Other (Comment) (pt for PEG for nutrition)       GO                Chales Abrahams 11/17/2019, 9:35 AM   Rolena Infante, MS [facility]  SLP Acute Rehab Services Office [phone]

## 2019-11-17 NOTE — Progress Notes (Signed)
[facility] Progress Note  1 Day Post-Op  Subjective: Patient states that he slept good and is supine in bed on arrival in no acute distress. Patient conversation limited due to trach tube and is answering questions with head nods. He denies any n/v and has urinated 2x this morning. Denies any issues with feeding tube and is tolorating feeding. He confirms that PT/OT came by yesterday but not yet today. Patient asking about discharge date.     Objective: Vital signs in last 24 hours: Temp:  [98.4 F (36.9 C)-98.8 F (37.1 C)] 98.5 F (36.9 C) (08/12 0738) Pulse Rate:  [72-87] 73 (08/12 0745) Resp:  [15-21] 18 (08/12 0745) BP: (94-130)/(62-87) 124/76 (08/12 0738) SpO2:  [93 %-99 %] 96 % (08/12 0745) FiO2 (%):  [28 %] 28 % (08/12 0745) Last BM Date: 11/16/19  Intake/Output from previous day: 08/11 0701 - 08/12 0700 In: 200 [I.V.:200] Out: 1105 [Urine:1100; Blood:5] Intake/Output this shift: No intake/output data recorded.  PE: General: pleasant, WD, WN male who is laying in bed in NAD HEENT: head is normocephalic, atraumatic.  Sclera are noninjected.  PERRL.  Heart: regular, rate, and rhythm.  Normal s1,s2. No obvious murmurs, gallops, or rubs noted.  Palpable radial and pedal pulses bilaterally Lungs: CTAB, no wheezes, rhonchi, or rales noted.  Respiratory effort nonlabored but continues to cough up non bloody, lighly yellow, thick phlem.  Abd: soft, mild TTP, ND, +BS, no masses, hernias, or organomegaly MS: all 4 extremities are symmetrical with no cyanosis, clubbing, or edema. Skin: warm and dry with no masses, lesions, or rashes. PEG location has no discharge or erythema.  Neuro: Cranial nerves 2-12 grossly intact, speech difficult due to trach. Psych: A&Ox3 with an appropriate affect.   Lab Results:  Recent Labs    11/17/19 0034  WBC 7.2  HGB 11.7*  HCT 33.4*  PLT 296   BMET Recent Labs    11/16/19 1530 11/17/19 0034  NA 137 137  K 4.7 4.6  CL  105 107  CO2 22 21*  GLUCOSE 157* 127*  BUN 9 10  CREATININE 0.95 0.84  CALCIUM 8.8* 8.7*   PT/INR No results for input(s): LABPROT, INR in the last 72 hours. CMP     Component Value Date/Time   NA 137 11/17/2019 0034   NA 138 10/18/2019 1509   K 4.6 11/17/2019 0034   CL 107 11/17/2019 0034   CO2 21 (L) 11/17/2019 0034   GLUCOSE 127 (H) 11/17/2019 0034   BUN 10 11/17/2019 0034   BUN 6 (L) 10/18/2019 1509   CREATININE 0.84 11/17/2019 0034   CREATININE 0.85 12/21/2012 1420   CALCIUM 8.7 (L) 11/17/2019 0034   PROT 7.2 11/09/2019 1500   PROT 8.2 10/18/2019 1509   ALBUMIN 2.4 (L) 11/17/2019 0034   ALBUMIN 4.4 10/18/2019 1509   AST 20 11/09/2019 1500   ALT 30 11/09/2019 1500   ALKPHOS 70 11/09/2019 1500   BILITOT 0.3 11/09/2019 1500   BILITOT 0.5 10/18/2019 1509   GFRNONAA >60 11/17/2019 0034   GFRAA >60 11/17/2019 0034   Lipase     Component Value Date/Time   LIPASE 472 (H) 03/04/2016 1217       Studies/Results: No results found.  Anti-infectives: Anti-infectives (From admission, onward)   None       Assessment/Plan Dysphagia   PEG placement- 11/16/2019 by Dr. Bedelia Person.  FEN:TF VTE: eliquis at 0859 on 8-12 ID: nothing currently  Dispo: Marco Pittman is tolerating TF via his PED and  no further surgical management is needed at this time. Will sign off. Call if further management is needed.   LOS: 8 days    Marco Pittman , PA-S 11/17/2019, 9:13 AM Please see Amion for pager number during day hours 7:00am-4:30pm

## 2019-11-17 NOTE — Progress Notes (Signed)
PROGRESS NOTE  Marco Pittman QZE:[id number] DOB: [birth year]   PCP: Marcine Matar, MD  Patient is from: Homeless.  Slept in his [facility] before hospitalization  DOA: 11/09/2019 LOS: 8  Brief Narrative / Interim history: 59 y.o.M with HTN, recent angioedema due to ACEi requiring intubation, recent acute PE and aspiration pneumonia in July 2021 requiring re-intubation, as well as chronic alcohol use and hx pancreatitis who presented with wheezing/stridor. Dr. [name] from ENT emergently consulted in the ER, performed bedside laryngoscopy that showed glottic stenosis and took the patient for tracheostomy on 8/4.  Patient had MBS on 8/6 and 8/9 that revealed severe dysphagia.  He had PEG tube placed on 11/16/2019.  Started on tube feed.  Pulmonology to arrange outpatient follow-up in trach clinic.  DME and supplies ordered by pulmonology.  Patient remains in-house until safe disposition which is likely SNF at this point  Subjective: Seen and examined earlier this morning.  No major events overnight of this morning.  No complaints.  He denies chest pain, dyspnea, GI or UTI symptoms except not having bowel movement in about a week.  Objective: Vitals:   11/17/19 0408 11/17/19 0738 11/17/19 0745 11/17/19 1112  BP:  124/76    Pulse: 82 72 73 70  Resp: 20 20 18 16   Temp:  98.5 F (36.9 C)    TempSrc:      SpO2: 94% 99% 96% 97%  Weight:      Height:        Intake/Output Summary (Last 24 hours) at 11/17/2019 1403 Last data filed at 11/17/2019 1351 Gross per 24 hour  Intake --  Output 1500 ml  Net -1500 ml   Filed Weights   11/11/19 0656 11/12/19 0630 11/13/19 0403  Weight: 74.3 kg 74.3 kg 74.1 kg    Examination:  GENERAL: No apparent distress.  Nontoxic. HEENT: MMM.  Vision and hearing grossly intact.  NECK: Trach collar RESP:  No IWOB.  Rhonchi bilaterally CVS:  RRR. Heart sounds normal.  ABD/GI/GU: BS+. Abd soft, NTND.  PEG tube MSK/EXT:  Moves extremities. No apparent  deformity. No edema.  SKIN: no apparent skin lesion or wound NEURO: Awake, alert and oriented appropriately.  No apparent focal neuro deficit. PSYCH: Calm. Normal affect.  Procedures:   8/4 Tracheostomy by ENT  8/6 MBS-- severe dysphagia  8/9 MBS repeat -- severe dysphagia, unchanged  8/11 PEG tube placement by general surgery  Microbiology summarized: COVID-19 PCR negative.  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 01-06-1971, ENT on 8/4 -Tolerating trach.  We will continue trach care.  -Per PCCM Home Trach / Discharge Needs per pulmonology -DME ordered for discharge: #6 shiley cuffless trach with back up #4 cuffless trach, suction, trach supplies (gauze, cleaning supplies), PMV -outpatient SLP follow up  -Follow up in trach clinic with 10/4, ACNP [phone] (called and left message for appt) -Outpatient follow-up with ENT  Recent PE in 10/2019 -IV heparin tonight.  We will switch back to Eliquis 80/12  Severe oropharyngeal dysphagia-MBS with severe dysphagia x2.  MRI brain without acute finding. -PEG tube placed 8/11 -Continue tube feed   Essential hypertension: Normotensive off amlodipine and carvedilol Continue as needed labetalol or hydralazine for severe range pressures  Alcohol use disorder: No evidence of withdrawal -Continue MVI, folate  Homelessness:  -TOC following   Body mass index is 21.55 kg/m.  Nutrition Problem: Inadequate oral intake Etiology: inability to eat Signs/Symptoms: NPO status Interventions: Tube feeding   DVT prophylaxis:  On IV heparin for PE apixaban (ELIQUIS) tablet 5 mg  Code Status: Full code Family Communication: Patient and/or RN. Available if any question.  Status is: Inpatient  Remains inpatient appropriate because:Unsafe d/c plan.      Dispo: The patient is from: Home              Anticipated d/c is to: SNF              Anticipated d/c date is: 1 day              Patient currently is medically stable to d/c.       Consultants:  ENT Pulmonology General surgery   Sch Meds:  Scheduled Meds: . apixaban  5 mg Per Tube BID  . Chlorhexidine Gluconate Cloth  6 each Topical Daily  . feeding supplement (PROSource TF)  45 mL Per Tube BID  . free water  200 mL Per Tube Q6H  . multivitamin  15 mL Per Tube Daily  . sennosides  5 mL Per Tube QHS   Continuous Infusions: . feeding supplement (OSMOLITE 1.5 CAL) 1,000 mL (11/17/19 0606)   PRN Meds:.hydrALAZINE, ipratropium-albuterol, labetalol, ondansetron (ZOFRAN) IV, polyethylene glycol  Antimicrobials: Anti-infectives (From admission, onward)   None       I have personally reviewed the following labs and images: CBC: Recent Labs  Lab 11/17/19 0034  WBC 7.2  HGB 11.7*  HCT 33.4*  MCV 89.1  PLT 296   BMP &GFR Recent Labs  Lab 11/16/19 1530 11/17/19 0034  NA 137 137  K 4.7 4.6  CL 105 107  CO2 22 21*  GLUCOSE 157* 127*  BUN 9 10  CREATININE 0.95 0.84  CALCIUM 8.8* 8.7*  MG  --  2.1  PHOS  --  3.5   Estimated Creatinine Clearance: 96.8 mL/min (by C-G formula based on SCr of 0.84 mg/dL). Liver & Pancreas: Recent Labs  Lab 11/17/19 0034  ALBUMIN 2.4*   No results for input(s): LIPASE, AMYLASE in the last 168 hours. No results for input(s): AMMONIA in the last 168 hours. Diabetic: No results for input(s): HGBA1C in the last 72 hours. Recent Labs  Lab 11/16/19 1935 11/16/19 2256 11/17/19 0351 11/17/19 0827 11/17/19 1206  GLUCAP 167* 128* 133* 102* 130*   Cardiac Enzymes: No results for input(s): CKTOTAL, CKMB, CKMBINDEX, TROPONINI in the last 168 hours. No results for input(s): PROBNP in the last 8760 hours. Coagulation Profile: No results for input(s): INR, PROTIME in the last 168 hours. Thyroid Function Tests: No results for  input(s): TSH, T4TOTAL, FREET4, T3FREE, THYROIDAB in the last 72 hours. Lipid Profile: No results for input(s): CHOL, HDL, LDLCALC, TRIG, CHOLHDL, LDLDIRECT in the last 72 hours. Anemia Panel: No results for input(s): VITAMINB12, FOLATE, FERRITIN, TIBC, IRON, RETICCTPCT in the last 72 hours. Urine analysis:    Component Value Date/Time   COLORURINE AMBER (A) 03/04/2016 1221   APPEARANCEUR HAZY (A) 03/04/2016 1221   APPEARANCEUR Cloudy (A) 10/06/2012 1452   LABSPEC 1.024 03/04/2016 1221   PHURINE 5.5 03/04/2016 1221   GLUCOSEU 100 (A) 03/04/2016 1221   HGBUR NEGATIVE 03/04/2016 1221   BILIRUBINUR SMALL (A) 03/04/2016 1221   BILIRUBINUR Negative 10/06/2012 1452   KETONESUR 15 (A) 03/04/2016 1221   PROTEINUR 30 (A) 03/04/2016 1221   NITRITE POSITIVE (A) 03/04/2016 1221   LEUKOCYTESUR SMALL (A) 03/04/2016 1221   LEUKOCYTESUR  1+ (A) 10/06/2012 1452   Sepsis Labs: Invalid input(s): PROCALCITONIN, LACTICIDVEN  Microbiology: Recent Results (from the past 240 hour(s))  SARS Coronavirus 2 by RT PCR (hospital order, performed in [facility] hospital lab) Nasopharyngeal Nasopharyngeal Swab     Status: None   Collection Time: 11/09/19  3:35 PM   Specimen: Nasopharyngeal Swab  Result Value Ref Range Status   SARS Coronavirus 2 NEGATIVE NEGATIVE Final    Comment: (NOTE) SARS-CoV-2 target nucleic acids are NOT DETECTED.  The SARS-CoV-2 RNA is generally detectable in upper and lower respiratory specimens during the acute phase of infection. The lowest concentration of SARS-CoV-2 viral copies this assay can detect is 250 copies / mL. A negative result does not preclude SARS-CoV-2 infection and should not be used as the sole basis for treatment or other patient management decisions.  A negative result may occur with improper specimen collection / handling, submission of specimen other than nasopharyngeal swab, presence of viral mutation(s) within the areas targeted by this assay, and  inadequate number of viral copies (<250 copies / mL). A negative result must be combined with clinical observations, patient history, and epidemiological information.  Fact Sheet for Patients:   BoilerBrush.com.cy  Fact Sheet for Healthcare Providers: https://pope.com/  This test is not yet approved or  cleared by the [place] FDA and has been authorized for detection and/or diagnosis of SARS-CoV-2 by FDA under an Emergency Use Authorization (EUA).  This EUA will remain in effect (meaning this test can be used) for the duration of the COVID-19 declaration under Section 564(b)(1) of the Act, 21 U.S.C. section 360bbb-3(b)(1), unless the authorization is terminated or revoked sooner.  Performed at [facility], [address] [place], [place] [address]     Radiology Studies: No results found.    Kahlil Cowans T. Anabel Lykins [facility]  If 7PM-7AM, please contact night-coverage [email or web address] Password [facility] 11/17/2019, 2:03 PM

## 2019-11-17 NOTE — Progress Notes (Signed)
ANTICOAGULATION CONSULT NOTE  Pharmacy Consult for heparin Indication: pulmonary embolus  Allergies  Allergen Reactions  . Lisinopril Swelling    Angioedema    Patient Measurements: Height: 6\' 1"  (185.4 cm) Weight: 74.1 kg (163 lb 5.8 oz) IBW/kg (Calculated) : 79.9 Heparin Dosing Weight: 74kg  Vital Signs: Temp: 98.4 F (36.9 C) (08/11 2110) Temp Source: Oral (08/11 2110) BP: 121/84 (08/11 2110) Pulse Rate: 72 (08/11 2323)  Labs: Recent Labs    11/15/19 0850 11/15/19 0850 11/15/19 1538 11/16/19 0318 11/16/19 1530 11/17/19 0034  HGB  --   --   --   --   --  11.7*  HCT  --   --   --   --   --  33.4*  PLT  --   --   --   --   --  296  APTT 86*   < > 114* 52*  --  74*  HEPARINUNFRC 0.57  --  0.79* 0.32  --   --   CREATININE  --   --   --   --  0.95 0.84   < > = values in this interval not displayed.    Estimated Creatinine Clearance: 96.8 mL/min (by C-G formula based on SCr of 0.84 mg/dL).  Assessment: 59 year old male with acute PE on 7/25.   Now s/p PEG - to resume heparin   8/12 AM update:  APTT therapeutic x 1 after re-start   Goal of Therapy:  Heparin level 0.3-0.7 units/ml aPTT 66-102 seconds Monitor platelets by anticoagulation protocol: Yes   Plan:  Cont heparin at 1400 units/hr 1000 aPTT/heparin level F/U return to apixaban 8/12  10/12, PharmD, BCPS Clinical Pharmacist Phone: [phone]

## 2019-11-17 NOTE — Plan of Care (Signed)

## 2019-11-17 NOTE — NC FL2 (Signed)
[place] MEDICAID FL2 LEVEL OF CARE SCREENING TOOL     IDENTIFICATION  Patient Name: Marco Pittman Birthdate: 1961-04-06 Sex: male Admission Date (Current Location): 11/09/2019  [facility] and [place] Number:  Producer, television/film/video and Address:  [facility], [address], [place], [place] [address]      Provider Number: [id number]  Attending Physician Name and Address:  Almon Hercules, MD  Relative Name and Phone Number:  Ozzie Knobel, [phone]    Current Level of Care: Hospital Recommended Level of Care: Skilled Nursing Facility Prior Approval Number:    Date Approved/Denied:   PASRR Number: [id number]  Discharge Plan: SNF    Current Diagnoses: Patient Active Problem List   Diagnosis Date Noted  . 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 Hypertensive urgency 10/31/2019  . Acute respiratory failure with hypoxia (HCC) 10/31/2019  . Mucus plugging of bronchi   . Acute metabolic encephalopathy 10/26/2019  . Malnutrition of moderate degree 10/22/2019  . Angio-edema 10/20/2019  . Acute hypoxemic respiratory failure (HCC)   . Former smoker 06/16/2017  . 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 Sessile colonic polyp 01/29/2017  . Pre-diabetes 01/27/2017  . Chronic left shoulder pain 01/27/2017  . Weight loss, unintentional 10/10/2016  . History of alcohol abuse 03/19/2016  . Chronic pancreatitis (HCC) 03/04/2016  . Essential hypertension, benign 10/06/2012    Orientation RESPIRATION BLADDER Height & Weight     Self, Time, Situation, Place  Tracheostomy Continent Weight: 163 lb 5.8 oz (74.1 kg) Height:  6\' 1"  (185.4 cm)  BEHAVIORAL SYMPTOMS/MOOD NEUROLOGICAL BOWEL NUTRITION STATUS      Continent Feeding tube  AMBULATORY STATUS COMMUNICATION OF NEEDS Skin   Independent Non-Verbally (new trach) Normal                       Personal Care Assistance Level of Assistance  Bathing, Feeding, Dressing Bathing Assistance:  Independent Feeding assistance: Limited assistance Dressing Assistance: Independent     Functional Limitations Info             SPECIAL CARE FACTORS FREQUENCY  Speech therapy             Speech Therapy Frequency: 2-4x week      Contractures Contractures Info: Not present    Additional Factors Info  Code Status, Allergies Code Status Info: full Allergies Info: lisinopril           Current Medications (11/17/2019):  This is the current hospital active medication list Current Facility-Administered Medications  Medication Dose Route Frequency Provider Last Rate Last Admin  . apixaban (ELIQUIS) tablet 5 mg  5 mg Per Tube BID 01/17/2020 T, MD   5 mg at 11/17/19 0859  . Chlorhexidine Gluconate Cloth 2 % PADS 6 each  6 each Topical Daily 01/17/20, DO   6 each at 11/17/19 0859  . feeding supplement (OSMOLITE 1.5 CAL) liquid 1,000 mL  1,000 mL Per Tube Continuous 01/17/20 T, MD 60 mL/hr at 11/17/19 0606 1,000 mL at 11/17/19 0606  . feeding supplement (PROSource TF) liquid 45 mL  45 mL Per Tube BID 01/17/20 T, MD   45 mL at 11/17/19 0859  . free water 200 mL  200 mL Per Tube Q6H Gonfa, Taye T, MD   200 mL at 11/17/19 1200  . hydrALAZINE (APRESOLINE) injection 10 mg  10 mg Intravenous Q6H PRN 01/17/20, DO      . ipratropium-albuterol (DUONEB) 0.5-2.5 (3) MG/3ML  nebulizer solution 3 mL  3 mL Nebulization Q6H PRN Briant Sites, DO      . labetalol (NORMODYNE) injection 10 mg  10 mg Intravenous Q6H PRN Danford, Earl Lites, MD      . multivitamin liquid 15 mL  15 mL Per Tube Daily Danford, Earl Lites, MD   15 mL at 11/17/19 0859  . ondansetron (ZOFRAN) injection 4 mg  4 mg Intravenous Q6H PRN Briant Sites, DO      . polyethylene glycol (MIRALAX / GLYCOLAX) packet 17 g  17 g Oral BID PRN Candelaria Stagers T, MD      . sennosides (SENOKOT) 8.8 MG/5ML syrup 5 mL  5 mL Per Tube QHS Danford, Earl Lites, MD   5 mL at 11/13/19 2325     Discharge  Medications: Please see discharge summary for a list of discharge medications.  Relevant Imaging Results:  Relevant Lab Results:   Additional Information [id number]  Lorri Frederick, LCSW

## 2019-11-18 DIAGNOSIS — J386 Stenosis of larynx: Secondary | ICD-10-CM | POA: Diagnosis not present

## 2019-11-18 DIAGNOSIS — I2782 Chronic pulmonary embolism: Secondary | ICD-10-CM | POA: Diagnosis not present

## 2019-11-18 DIAGNOSIS — Z9889 Other specified postprocedural states: Secondary | ICD-10-CM | POA: Diagnosis not present

## 2019-11-18 DIAGNOSIS — I10 Essential (primary) hypertension: Secondary | ICD-10-CM | POA: Diagnosis not present

## 2019-11-18 LAB — MAGNESIUM: Magnesium: 1.7 mg/dL (ref 1.7–2.4)

## 2019-11-18 LAB — GLUCOSE, CAPILLARY
Glucose-Capillary: 109 mg/dL — ABNORMAL HIGH (ref 70–99)
Glucose-Capillary: 122 mg/dL — ABNORMAL HIGH (ref 70–99)
Glucose-Capillary: 130 mg/dL — ABNORMAL HIGH (ref 70–99)
Glucose-Capillary: 132 mg/dL — ABNORMAL HIGH (ref 70–99)
Glucose-Capillary: 134 mg/dL — ABNORMAL HIGH (ref 70–99)
Glucose-Capillary: 149 mg/dL — ABNORMAL HIGH (ref 70–99)

## 2019-11-18 LAB — RENAL FUNCTION PANEL
Albumin: 2.4 g/dL — ABNORMAL LOW (ref 3.5–5.0)
Anion gap: 10 (ref 5–15)
BUN: 9 mg/dL (ref 8–23)
CO2: 24 mmol/L (ref 22–32)
Calcium: 8.8 mg/dL — ABNORMAL LOW (ref 8.9–10.3)
Chloride: 103 mmol/L (ref 98–111)
Creatinine, Ser: 0.85 mg/dL (ref 0.61–1.24)
GFR calc Af Amer: >60 mL/min (ref >60)
GFR calc non Af Amer: >60 mL/min (ref >60)
Glucose, Bld: 141 mg/dL — ABNORMAL HIGH (ref 70–99)
Phosphorus: 3.9 mg/dL (ref 2.5–4.6)
Potassium: 4 mmol/L (ref 3.5–5.1)
Sodium: 137 mmol/L (ref 135–145)

## 2019-11-18 MED ORDER — MAGNESIUM SULFATE 2 GM/50ML IV SOLN
2.0000 g | Freq: Once | INTRAVENOUS | Status: AC
  Administered 2019-11-18: 2 g via INTRAVENOUS
  Filled 2019-11-18: qty 50

## 2019-11-18 NOTE — TOC Progression Note (Signed)
Transition of Care [facility]) - Progression Note    Patient Details  Name: Marco Pittman MRN: [id number] Date of Birth: October 19, 1960  Transition of [facility]) CM/SW Contact  Lorri Frederick, LCSW Phone Number: 11/18/2019, 3:18 PM  Clinical Narrative:   Plan continues to be SNF.  No bed offers yet.  Spoke to [place] who came to visit pt and to be trained on trach care by respiratory.  He and pt sister are working on an apartment option but still supporting plan for SNF.  Spoke to respiratory who will attempt to find time today to train [place] on trach care.    Expected Discharge Plan: Home w Home Health Services Barriers to Discharge: Continued Medical Work up, English as a second language teacher, Equipment Delay  Expected Discharge Plan and Services Expected Discharge Plan: Home w Home Health Services     Post Acute Care Choice: Home Health Living arrangements for the past 2 months: Single Family Home Expected Discharge Date: 11/17/19                                     Social Determinants of Health (SDOH) Interventions    Readmission Risk Interventions No flowsheet data found.

## 2019-11-18 NOTE — Progress Notes (Signed)
RT NOTES: Trach education provided to patient's roommate [place] Folk. Trach care booklet given and all questions answered.

## 2019-11-18 NOTE — Progress Notes (Signed)
PROGRESS NOTE  Marco Pittman ZWC:[id number] DOB: [birth year]   PCP: Marcine Matar, MD  Patient is from: Homeless.  Slept in his [facility] before hospitalization  DOA: 11/09/2019 LOS: 9  Brief Narrative / Interim history: 59 y.o.M with HTN, recent angioedema due to ACEi requiring intubation, recent acute PE and aspiration pneumonia in July 2021 requiring re-intubation, as well as chronic alcohol use and hx pancreatitis who presented with wheezing/stridor. Dr. [name] from ENT emergently consulted in the ER, performed bedside laryngoscopy that showed glottic stenosis and took the patient for tracheostomy on 8/4.  Patient had MBS on 8/6 and 8/9 that revealed severe dysphagia.  He had PEG tube placed on 11/16/2019.  Started on tube feed.  Pulmonology to arrange outpatient follow-up in trach clinic.  DME and supplies ordered by pulmonology.  Patient remains in-house until safe disposition which is likely SNF at this point.   Subjective: Seen and examined earlier this morning. No major events overnight of this morning. No complaints. Trach secretion decreased after glycopyrrolate. Denies chest pain, dyspnea, GI or UTI symptoms. He says he had a bowel movement. Patient's longtime friend at bedside.  Objective: Vitals:   11/18/19 0320 11/18/19 0328 11/18/19 0727 11/18/19 0745  BP:    116/82  Pulse:  73 84 80  Resp:  18 18 19   Temp:    98.7 F (37.1 C)  TempSrc:      SpO2:  98% 96% 98%  Weight: 73.6 kg     Height:        Intake/Output Summary (Last 24 hours) at 11/18/2019 1047 Last data filed at 11/18/2019 0319 Gross per 24 hour  Intake --  Output 2351 ml  Net -2351 ml   Filed Weights   11/12/19 0630 11/13/19 0403 11/18/19 0320  Weight: 74.3 kg 74.1 kg 73.6 kg    Examination:  GENERAL: No apparent distress.  Nontoxic. HEENT: MMM.  Vision and hearing grossly intact.  NECK: Trach collar in place. RESP:  No IWOB.  Fair aeration bilaterally. CVS:  RRR. Heart sounds normal.   ABD/GI/GU: BS+. Abd soft, NTND. G-tube MSK/EXT:  Moves extremities. No apparent deformity. No edema.  SKIN: no apparent skin lesion or wound NEURO: Awake, alert and oriented appropriately.  No apparent focal neuro deficit. PSYCH: Calm. Normal affect.   Procedures:   8/4 Tracheostomy by ENT  8/6 MBS-- severe dysphagia  8/9 MBS repeat -- severe dysphagia, unchanged  8/11 PEG tube placement by general surgery  Microbiology summarized: COVID-19 PCR negative.  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 01-06-1971, ENT on 8/4 -Basically on room air via trach collar. -Per PCCM Home Trach / Discharge Needs per pulmonology -DME ordered for discharge: #6 shiley cuffless trach with back up #4 cuffless trach, suction, trach supplies (gauze, cleaning supplies), PMV -outpatient SLP follow up  -Follow up in trach clinic with 10/4, ACNP [phone] (called and left message for appt) -Outpatient follow-up with ENT  Recent PE in 10/2019 -On Eliquis  Severe oropharyngeal dysphagia-MBS with severe dysphagia x2.  MRI brain without acute finding. -PEG tube placed 8/11 -Continue tube feed   Essential hypertension: Normotensive off amlodipine and carvedilol -Continue as needed labetalol or hydralazine for severe range pressures  Alcohol use disorder: No evidence of withdrawal -Continue MVI, folate  Homelessness:  -TOC searching for SNF   Body mass index is 21.41 kg/m. Nutrition  Problem: Inadequate oral intake Etiology: inability to eat Signs/Symptoms: NPO status Interventions: Tube feeding   DVT prophylaxis:  On IV heparin for PE apixaban (ELIQUIS) tablet 5 mg  Code Status: Full code Family Communication: Updated patient's friend at bedside Status is: Inpatient  Remains inpatient appropriate  because:Unsafe d/c plan.     Dispo: The patient is from: Home              Anticipated d/c is to: SNF              Anticipated d/c date is: 1 day              Patient currently is medically stable to d/c.       Consultants:  ENT-signed off Pulmonology-signed off General surgery-signed off   Sch Meds:  Scheduled Meds: . apixaban  5 mg Per Tube BID  . Chlorhexidine Gluconate Cloth  6 each Topical Daily  . feeding supplement (PROSource TF)  45 mL Per Tube BID  . free water  200 mL Per Tube Q6H  . glycopyrrolate  1 mg Oral BID  . multivitamin  15 mL Per Tube Daily  . sennosides  5 mL Per Tube QHS   Continuous Infusions: . feeding supplement (OSMOLITE 1.5 CAL) 1,000 mL (11/18/19 0319)   PRN Meds:.hydrALAZINE, ipratropium-albuterol, labetalol, ondansetron (ZOFRAN) IV, polyethylene glycol  Antimicrobials: Anti-infectives (From admission, onward)   None       I have personally reviewed the following labs and images: CBC: Recent Labs  Lab 11/17/19 0034  WBC 7.2  HGB 11.7*  HCT 33.4*  MCV 89.1  PLT 296   BMP &GFR Recent Labs  Lab 11/16/19 1530 11/17/19 0034 11/18/19 0115  NA 137 137 137  K 4.7 4.6 4.0  CL 105 107 103  CO2 22 21* 24  GLUCOSE 157* 127* 141*  BUN 9 10 9   CREATININE 0.95 0.84 0.85  CALCIUM 8.8* 8.7* 8.8*  MG  --  2.1 1.7  PHOS  --  3.5 3.9   Estimated Creatinine Clearance: 95 mL/min (by C-G formula based on SCr of 0.85 mg/dL). Liver & Pancreas: Recent Labs  Lab 11/17/19 0034 11/18/19 0115  ALBUMIN 2.4* 2.4*   No results for input(s): LIPASE, AMYLASE in the last 168 hours. No results for input(s): AMMONIA in the last 168 hours. Diabetic: No results for input(s): HGBA1C in the last 72 hours. Recent Labs  Lab 11/17/19 1620 11/17/19 1948 11/17/19 2342 11/18/19 0351 11/18/19 0742  GLUCAP 120* 140* 113* 109* 134*   Cardiac Enzymes: No results for input(s): CKTOTAL, CKMB, CKMBINDEX, TROPONINI in the last 168 hours. No results for  input(s): PROBNP in the last 8760 hours. Coagulation Profile: No results for input(s): INR, PROTIME in the last 168 hours. Thyroid Function Tests: No results for input(s): TSH, T4TOTAL, FREET4, T3FREE, THYROIDAB in the last 72 hours. Lipid Profile: No results for input(s): CHOL, HDL, LDLCALC, TRIG, CHOLHDL, LDLDIRECT in the last 72 hours. Anemia Panel: No results for input(s): VITAMINB12, FOLATE, FERRITIN, TIBC, IRON, RETICCTPCT in the last 72 hours. Urine analysis:    Component Value Date/Time   COLORURINE AMBER (A) 03/04/2016 1221   APPEARANCEUR HAZY (A) 03/04/2016 1221   APPEARANCEUR Cloudy (A) 10/06/2012 1452   LABSPEC 1.024 03/04/2016 1221   PHURINE 5.5 03/04/2016 1221   GLUCOSEU 100 (A) 03/04/2016 1221   HGBUR NEGATIVE 03/04/2016 1221   BILIRUBINUR SMALL (A) 03/04/2016 1221   BILIRUBINUR Negative 10/06/2012 1452   KETONESUR 15 (A) 03/04/2016 1221  PROTEINUR 30 (A) 03/04/2016 1221   NITRITE POSITIVE (A) 03/04/2016 1221   LEUKOCYTESUR SMALL (A) 03/04/2016 1221   LEUKOCYTESUR 1+ (A) 10/06/2012 1452   Sepsis Labs: Invalid input(s): PROCALCITONIN, LACTICIDVEN  Microbiology: Recent Results (from the past 240 hour(s))  SARS Coronavirus 2 by RT PCR (hospital order, performed in [facility] hospital lab) Nasopharyngeal Nasopharyngeal Swab     Status: None   Collection Time: 11/09/19  3:35 PM   Specimen: Nasopharyngeal Swab  Result Value Ref Range Status   SARS Coronavirus 2 NEGATIVE NEGATIVE Final    Comment: (NOTE) SARS-CoV-2 target nucleic acids are NOT DETECTED.  The SARS-CoV-2 RNA is generally detectable in upper and lower respiratory specimens during the acute phase of infection. The lowest concentration of SARS-CoV-2 viral copies this assay can detect is 250 copies / mL. A negative result does not preclude SARS-CoV-2 infection and should not be used as the sole basis for treatment or other patient management decisions.  A negative result may occur with improper  specimen collection / handling, submission of specimen other than nasopharyngeal swab, presence of viral mutation(s) within the areas targeted by this assay, and inadequate number of viral copies (<250 copies / mL). A negative result must be combined with clinical observations, patient history, and epidemiological information.  Fact Sheet for Patients:   BoilerBrush.com.cy  Fact Sheet for Healthcare Providers: https://pope.com/  This test is not yet approved or  cleared by the [place] FDA and has been authorized for detection and/or diagnosis of SARS-CoV-2 by FDA under an Emergency Use Authorization (EUA).  This EUA will remain in effect (meaning this test can be used) for the duration of the COVID-19 declaration under Section 564(b)(1) of the Act, 21 U.S.C. section 360bbb-3(b)(1), unless the authorization is terminated or revoked sooner.  Performed at [facility], [address] [place], [place] [address]     Radiology Studies: No results found.    Katlynn Naser T. Mikel Pyon [facility]  If 7PM-7AM, please contact night-coverage [email or web address] Password [facility] 11/18/2019, 10:47 AM

## 2019-11-19 DIAGNOSIS — J386 Stenosis of larynx: Secondary | ICD-10-CM | POA: Diagnosis not present

## 2019-11-19 LAB — GLUCOSE, CAPILLARY
Glucose-Capillary: 111 mg/dL — ABNORMAL HIGH (ref 70–99)
Glucose-Capillary: 123 mg/dL — ABNORMAL HIGH (ref 70–99)
Glucose-Capillary: 125 mg/dL — ABNORMAL HIGH (ref 70–99)
Glucose-Capillary: 125 mg/dL — ABNORMAL HIGH (ref 70–99)
Glucose-Capillary: 129 mg/dL — ABNORMAL HIGH (ref 70–99)
Glucose-Capillary: 143 mg/dL — ABNORMAL HIGH (ref 70–99)

## 2019-11-19 NOTE — Progress Notes (Signed)
Marco Pittman  ULA:[id number] DOB: [birth year] DOA: 11/09/2019 PCP: Marcine Matar, MD    Brief Narrative:  59 year old with a history of HTN, angioedema to ACEi requiring intubation, acute PE and aspiration pneumonia July 2021 requiring intubation, chronic alcohol abuse, and chronic pancreatitis who presented to the hospital 11/09/2019 with wheezing and stridor. ENT was emergently consulted in the ED and performed a bedside laryngoscopy that revealed glottic stenosis. The patient was taken for tracheostomy that day.  Modified barium swallow on 8/6 and again on 8/9 revealed severe dysphagia and the patient underwent PEG tube placement 8/11.  The patient is currently medically stable but is awaiting placement in a skilled nursing facility for ongoing care.  Significant Events:  8/4 admit via [place] -tracheostomy by ENT 8/6 MBS with severe dysphagia 8/9 repeat MBS with severe dysphagia 8/11 PEG tube placed by general surgery  Subjective: Vital signs are stable and the patient is afebrile. Saturations are stable in the mid 90s on 5 L via trach collar.  Sitting up in a bedside chair in good spirits.  Alert and oriented.  Denies chest pain fevers chills nausea or vomiting.  Antimicrobials:  None  DVT prophylaxis: Eliquis  Assessment & Plan:  Acute hypoxic respiratory failure -subglottic tracheal stenosis Status post emergent tracheostomy per ENT 8/4 -doing well on room air via trach collar -trach care per PCCM  Pulmonary embolism July 2021 Continue Eliquis  Severe oropharyngeal dysphagia Confirmed via MBS x2 during this hospital stay -no acute findings MRI brain -PEG tube placed by surgery 8/11 -tolerating tube feeds  Essential HTN Blood pressure currently controlled  Alcohol abuse No withdrawal encountered during this hospital stay  Homeless Patient will require placement in an SNF   Code Status: FULL CODE Family Communication:  Status is: Inpatient  Remains  inpatient appropriate because:Unsafe d/c plan   Dispo: The patient is from: Home              Anticipated d/c is to: SNF              Anticipated d/c date is: 1 day              Patient currently is medically stable to d/c.  Consultants:  none  Objective: Blood pressure 101/74, pulse 88, temperature 98.7 F (37.1 C), resp. rate 20, height 6\' 1"  (1.854 m), weight 73.7 kg, SpO2 95 %.  Intake/Output Summary (Last 24 hours) at 11/19/2019 0902 Last data filed at 11/19/2019 0300 Gross per 24 hour  Intake 290 ml  Output 2250 ml  Net -1960 ml   Filed Weights   11/13/19 0403 11/18/19 0320 11/19/19 0300  Weight: 74.1 kg 73.6 kg 73.7 kg    Examination: General: No acute respiratory distress Lungs: Clear to auscultation bilaterally without wheezes or crackles Cardiovascular: Regular rate and rhythm without murmur gallop or rub normal S1 and S2 Abdomen: Nontender, nondistended, soft, bowel sounds positive, no rebound, no ascites, no appreciable mass Extremities: No significant cyanosis, clubbing, or edema bilateral lower extremities  CBC: Recent Labs  Lab 11/17/19 0034  WBC 7.2  HGB 11.7*  HCT 33.4*  MCV 89.1  PLT 296   Basic Metabolic Panel: Recent Labs  Lab 11/16/19 1530 11/17/19 0034 11/18/19 0115  NA 137 137 137  K 4.7 4.6 4.0  CL 105 107 103  CO2 22 21* 24  GLUCOSE 157* 127* 141*  BUN 9 10 9   CREATININE 0.95 0.84 0.85  CALCIUM 8.8* 8.7* 8.8*  MG  --  2.1 1.7  PHOS  --  3.5 3.9   GFR: Estimated Creatinine Clearance: 95.1 mL/min (by C-G formula based on SCr of 0.85 mg/dL).  Liver Function Tests: Recent Labs  Lab 11/17/19 0034 11/18/19 0115  ALBUMIN 2.4* 2.4*   HbA1C: Hemoglobin A1C  Date/Time Value Ref Range Status  05/05/2018 09:12 AM 5.9 (A) 4.0 - 5.6 % Final  10/10/2016 09:20 AM 6.1  Final   Hgb A1c MFr Bld  Date/Time Value Ref Range Status  10/18/2019 03:09 PM 5.9 (H) 4.8 - 5.6 % Final    Comment:             Prediabetes: 5.7 - 6.4           Diabetes: >6.4          Glycemic control for adults with diabetes: <7.0   03/05/2016 05:43 AM 5.9 (H) 4.8 - 5.6 % Final    Comment:    (NOTE)         Pre-diabetes: 5.7 - 6.4         Diabetes: >6.4         Glycemic control for adults with diabetes: <7.0     CBG: Recent Labs  Lab 11/18/19 1647 11/18/19 1953 11/18/19 2343 11/19/19 0321 11/19/19 0746  GLUCAP 130* 132* 122* 125* 129*    Recent Results (from the past 240 hour(s))  SARS Coronavirus 2 by RT PCR (hospital order, performed in [facility] hospital lab) Nasopharyngeal Nasopharyngeal Swab     Status: None   Collection Time: 11/09/19  3:35 PM   Specimen: Nasopharyngeal Swab  Result Value Ref Range Status   SARS Coronavirus 2 NEGATIVE NEGATIVE Final    Comment: (NOTE) SARS-CoV-2 target nucleic acids are NOT DETECTED.  The SARS-CoV-2 RNA is generally detectable in upper and lower respiratory specimens during the acute phase of infection. The lowest concentration of SARS-CoV-2 viral copies this assay can detect is 250 copies / mL. A negative result does not preclude SARS-CoV-2 infection and should not be used as the sole basis for treatment or other patient management decisions.  A negative result may occur with improper specimen collection / handling, submission of specimen other than nasopharyngeal swab, presence of viral mutation(s) within the areas targeted by this assay, and inadequate number of viral copies (<250 copies / mL). A negative result must be combined with clinical observations, patient history, and epidemiological information.  Fact Sheet for Patients:   BoilerBrush.com.cy  Fact Sheet for Healthcare Providers: https://pope.com/  This test is not yet approved or  cleared by the [place] FDA and has been authorized for detection and/or diagnosis of SARS-CoV-2 by FDA under an Emergency Use Authorization (EUA).  This EUA will remain in effect  (meaning this test can be used) for the duration of the COVID-19 declaration under Section 564(b)(1) of the Act, 21 U.S.C. section 360bbb-3(b)(1), unless the authorization is terminated or revoked sooner.  Performed at [facility], [address], [place], [place] [address]      Scheduled Meds: . apixaban  5 mg Per Tube BID  . Chlorhexidine Gluconate Cloth  6 each Topical Daily  . feeding supplement (PROSource TF)  45 mL Per Tube BID  . free water  200 mL Per Tube Q6H  . glycopyrrolate  1 mg Oral BID  . multivitamin  15 mL Per Tube Daily  . sennosides  5 mL Per Tube QHS   Continuous Infusions: . feeding supplement (OSMOLITE 1.5 CAL) 1,000 mL (11/18/19 1940)  LOS: 10 days   Lonia Blood, MD [facility] Office  [phone] Pager - Text Page per Amion  If 7PM-7AM, please contact night-coverage per Amion 11/19/2019, 9:02 AM

## 2019-11-19 NOTE — Progress Notes (Signed)
   11/19/19 2145  Assess: MEWS Score  Temp (!) 100.5 F (38.1 C)  BP 99/69  Pulse Rate 84  ECG Heart Rate 84  Resp 18  SpO2 99 %  O2 Device Tracheostomy Collar  Assess: MEWS Score  MEWS Temp 1  MEWS Systolic 1  MEWS Pulse 0  MEWS RR 0  MEWS LOC 0  MEWS Score 2  MEWS Score Color Yellow  Assess: if the MEWS score is Yellow or Red  Were vital signs taken at a resting state? Yes  Focused Assessment No change from prior assessment  Early Detection of Sepsis Score *See Row Information* Low  MEWS guidelines implemented *See Row Information* No, vital signs rechecked (elevated temp, on call provider notified)

## 2019-11-20 DIAGNOSIS — J386 Stenosis of larynx: Secondary | ICD-10-CM | POA: Diagnosis not present

## 2019-11-20 DIAGNOSIS — R1310 Dysphagia, unspecified: Secondary | ICD-10-CM

## 2019-11-20 LAB — GLUCOSE, CAPILLARY
Glucose-Capillary: 100 mg/dL — ABNORMAL HIGH (ref 70–99)
Glucose-Capillary: 103 mg/dL — ABNORMAL HIGH (ref 70–99)
Glucose-Capillary: 110 mg/dL — ABNORMAL HIGH (ref 70–99)
Glucose-Capillary: 120 mg/dL — ABNORMAL HIGH (ref 70–99)
Glucose-Capillary: 135 mg/dL — ABNORMAL HIGH (ref 70–99)
Glucose-Capillary: 96 mg/dL (ref 70–99)

## 2019-11-20 MED ORDER — OSMOLITE 1.5 CAL PO LIQD: 1000.0000 mL | ORAL | Status: DC

## 2019-11-20 MED ORDER — FREE WATER: 200.0000 mL | Freq: Four times a day (QID) | Status: DC

## 2019-11-20 MED ORDER — POLYETHYLENE GLYCOL 3350 17 G PO PACK: 17.0000 g | PACK | Freq: Two times a day (BID) | ORAL | Status: DC | PRN

## 2019-11-20 MED ORDER — PROSOURCE TF PO LIQD: 45.0000 mL | Freq: Two times a day (BID) | ORAL | Status: DC

## 2019-11-20 MED ORDER — THIAMINE HCL 100 MG PO TABS
100.0000 mg | ORAL_TABLET | Freq: Every day | ORAL | Status: DC
  Administered 2019-11-20 – 2019-12-10 (×21): 100 mg
  Filled 2019-11-20 (×21): qty 1

## 2019-11-20 MED ORDER — GLYCOPYRROLATE 1 MG PO TABS
1.0000 mg | ORAL_TABLET | Freq: Three times a day (TID) | ORAL | Status: DC | PRN
  Administered 2019-11-29 – 2019-11-30 (×2): 1 mg via ORAL
  Filled 2019-11-20 (×2): qty 1

## 2019-11-20 NOTE — Progress Notes (Signed)
Marco Pittman  ZOX:[id number] DOB: 06-08-1960 DOA: 11/09/2019 PCP: Marcine Matar, MD    Brief Narrative:  59 year old with a history of HTN, angioedema to ACEi requiring intubation, acute PE and aspiration pneumonia July 2021 requiring intubation, chronic alcohol abuse, and chronic pancreatitis who presented to the hospital 11/09/2019 with wheezing and stridor. ENT was emergently consulted in the ED and performed a bedside laryngoscopy that revealed glottic stenosis. The patient was taken to the OR for tracheostomy that day.  Modified barium swallow on 8/6 and again on 8/9 revealed severe dysphagia and the patient underwent PEG tube placement 8/11.  The patient is currently medically stable but is awaiting placement in a skilled nursing facility for ongoing care.  Significant Events:  8/4 admit via [place] -tracheostomy by ENT 8/6 MBS with severe dysphagia need with 8/9 repeat MBS with severe dysphagia 8/11 PEG tube placed by general surgery in OR  Subjective: Medically stable at this time.  Tolerating his trach well.  Good voice with use of PMV.  No complaints other than being however very to be discharged.  Antimicrobials:  None  DVT prophylaxis: Eliquis  Assessment & Plan:  Acute hypoxic respiratory failure -subglottic tracheal stenosis Status post emergent tracheostomy per ENT 8/4 -doing well on room air via trach collar -trach care per PCCM  Pulmonary embolism July 2021 Continue Eliquis  Severe oropharyngeal dysphagia -idiopathic Confirmed via MBS x2 during this hospital stay -no acute findings on MRI brain -PEG tube placed by surgery 8/11 -tolerating tube feeds -consider repeat SLP evaluation as patient becomes stronger and more mobile  Essential HTN Blood pressure controlled  Alcohol abuse No withdrawal encountered during this hospital stay -empirically support with thiamine  Normocytic anemia Most likely due to bone marrow suppression of alcoholism as well as  poor nutrition -no evidence of gross blood loss -check anemia panel  Homeless Patient will require placement in an SNF   Code Status: FULL CODE Family Communication:  Status is: Inpatient  Remains inpatient appropriate because:Unsafe d/c plan   Dispo: The patient is from: Home              Anticipated d/c is to: SNF              Anticipated d/c date is: 1 day              Patient currently is medically stable to d/c.  Consultants:  none  Objective: Blood pressure 99/69, pulse 96, temperature 99.1 F (37.3 C), resp. rate 18, height 6\' 1"  (1.854 m), weight 73.7 kg, SpO2 95 %.  Intake/Output Summary (Last 24 hours) at 11/20/2019 0834 Last data filed at 11/20/2019 0354 Gross per 24 hour  Intake 290 ml  Output 1050 ml  Net -760 ml   Filed Weights   11/18/19 0320 11/19/19 0300 11/20/19 0353  Weight: 73.6 kg 73.7 kg 73.7 kg    Examination: General: No acute respiratory distress Lungs: CTA B Cardiovascular: RRR w/o M Abdomen: NT/ND, soft, BS+, no rebound Extremities: No C/C/E B LE    CBC: Recent Labs  Lab 11/17/19 0034  WBC 7.2  HGB 11.7*  HCT 33.4*  MCV 89.1  PLT 296   Basic Metabolic Panel: Recent Labs  Lab 11/16/19 1530 11/17/19 0034 11/18/19 0115  NA 137 137 137  K 4.7 4.6 4.0  CL 105 107 103  CO2 22 21* 24  GLUCOSE 157* 127* 141*  BUN 9 10 9   CREATININE 0.95 0.84 0.85  CALCIUM 8.8* 8.7*  8.8*  MG  --  2.1 1.7  PHOS  --  3.5 3.9   GFR: Estimated Creatinine Clearance: 95.1 mL/min (by C-G formula based on SCr of 0.85 mg/dL).  Liver Function Tests: Recent Labs  Lab 11/17/19 0034 11/18/19 0115  ALBUMIN 2.4* 2.4*   HbA1C: Hemoglobin A1C  Date/Time Value Ref Range Status  05/05/2018 09:12 AM 5.9 (A) 4.0 - 5.6 % Final  10/10/2016 09:20 AM 6.1  Final   Hgb A1c MFr Bld  Date/Time Value Ref Range Status  10/18/2019 03:09 PM 5.9 (H) 4.8 - 5.6 % Final    Comment:             Prediabetes: 5.7 - 6.4          Diabetes: >6.4          Glycemic  control for adults with diabetes: <7.0   03/05/2016 05:43 AM 5.9 (H) 4.8 - 5.6 % Final    Comment:    (NOTE)         Pre-diabetes: 5.7 - 6.4         Diabetes: >6.4         Glycemic control for adults with diabetes: <7.0     CBG: Recent Labs  Lab 11/19/19 1231 11/19/19 1633 11/19/19 1945 11/19/19 2349 11/20/19 0352  GLUCAP 111* 125* 143* 123* 110*    No results found for this or any previous visit (from the past 240 hour(s)).   Scheduled Meds: . apixaban  5 mg Per Tube BID  . Chlorhexidine Gluconate Cloth  6 each Topical Daily  . feeding supplement (PROSource TF)  45 mL Per Tube BID  . free water  200 mL Per Tube Q6H  . glycopyrrolate  1 mg Oral BID  . multivitamin  15 mL Per Tube Daily  . sennosides  5 mL Per Tube QHS   Continuous Infusions: . feeding supplement (OSMOLITE 1.5 CAL) 1,000 mL (11/20/19 0605)     LOS: 11 days   Lonia Blood, MD [facility] Office  [phone] Pager - Text Page per Amion  If 7PM-7AM, please contact night-coverage per Amion 11/20/2019, 8:34 AM

## 2019-11-21 DIAGNOSIS — Z430 Encounter for attention to tracheostomy: Secondary | ICD-10-CM

## 2019-11-21 DIAGNOSIS — J9611 Chronic respiratory failure with hypoxia: Secondary | ICD-10-CM | POA: Diagnosis not present

## 2019-11-21 DIAGNOSIS — J386 Stenosis of larynx: Secondary | ICD-10-CM | POA: Diagnosis not present

## 2019-11-21 DIAGNOSIS — Z930 Tracheostomy status: Secondary | ICD-10-CM | POA: Diagnosis not present

## 2019-11-21 LAB — GLUCOSE, CAPILLARY
Glucose-Capillary: 156 mg/dL — ABNORMAL HIGH (ref 70–99)
Glucose-Capillary: 150 mg/dL — ABNORMAL HIGH (ref 70–99)
Glucose-Capillary: 116 mg/dL — ABNORMAL HIGH (ref 70–99)
Glucose-Capillary: 135 mg/dL — ABNORMAL HIGH (ref 70–99)
Glucose-Capillary: 109 mg/dL — ABNORMAL HIGH (ref 70–99)
Glucose-Capillary: 124 mg/dL — ABNORMAL HIGH (ref 70–99)

## 2019-11-21 LAB — RETICULOCYTES
Immature Retic Fract: 9.3 % (ref 2.3–15.9)
RBC.: 4 MIL/uL — ABNORMAL LOW (ref 4.22–5.81)
Retic Count, Absolute: 30.4 10*3/uL (ref 19.0–186.0)
Retic Ct Pct: 0.8 % (ref 0.4–3.1)

## 2019-11-21 LAB — CBC
HCT: 35.5 % — ABNORMAL LOW (ref 39.0–52.0)
Hemoglobin: 12.3 g/dL — ABNORMAL LOW (ref 13.0–17.0)
MCH: 30.8 pg (ref 26.0–34.0)
MCHC: 34.6 g/dL (ref 30.0–36.0)
MCV: 89 fL (ref 80.0–100.0)
Platelets: 291 10*3/uL (ref 150–400)
RBC: 3.99 MIL/uL — ABNORMAL LOW (ref 4.22–5.81)
RDW: 12.2 % (ref 11.5–15.5)
WBC: 6.5 10*3/uL (ref 4.0–10.5)
nRBC: 0 % (ref 0.0–0.2)

## 2019-11-21 LAB — VITAMIN B12: Vitamin B-12: 375 pg/mL (ref 180–914)

## 2019-11-21 LAB — FOLATE: Folate: 11.8 ng/mL (ref >5.9)

## 2019-11-21 LAB — IRON AND TIBC
Iron: 41 ug/dL — ABNORMAL LOW (ref 45–182)
Saturation Ratios: 18 % (ref 17.9–39.5)
TIBC: 232 ug/dL — ABNORMAL LOW (ref 250–450)
UIBC: 191 ug/dL

## 2019-11-21 LAB — FERRITIN: Ferritin: 455 ng/mL — ABNORMAL HIGH (ref 24–336)

## 2019-11-21 NOTE — Progress Notes (Signed)
Nutrition Follow-up  DOCUMENTATION CODES:   Not applicable  INTERVENTION:  Continue via PEG: -Osmolite 1.5 cal @ 59m/hr -429mProsource TF BID -20059mree water Q6H  Tube feeding regimen provides2240kcal (100% of needs),112grams of protein, and 1894m51m H2O.   NUTRITION DIAGNOSIS:   Inadequate oral intake related to inability to eat as evidenced by NPO status.  Ongoing  GOAL:   Patient will meet greater than or equal to 90% of their needs  Met with TF  MONITOR:   Diet advancement, TF tolerance, Skin, Weight trends, Labs, I & O's  REASON FOR ASSESSMENT:   Consult Enteral/tube feeding initiation and management  ASSESSMENT:   [id number] who presents with chief complaint of shortness of breath, on evaluation was found to have subglottic stenosis. Pt underwent emergent awake tracheostomy 8/4.  8/6 Cortrak placed (gastric) 8/9 failed MBS, Cortrak removed 8/11 s/p EGD and PEG placement  Pt tolerating trach well per MD. Pt eager for discharge. Pending placement in SNF.   Current TF via PEG: Osmolite 1.5 cal @ 60ml61m 45ml 55mource TF BID, 200ml f88mwater Q6H  Admit wt: 74.2kg Current wt: 73.7kg  UOP: 2,950ml x241murs I/O: -8,784.8ml sinc33mdmit  Labs: CBGs 156-150-116 Medications: MVI, Senokot, Thiamine  Diet Order:   Diet Order            Diet NPO time specified  Diet effective now                 EDUCATION NEEDS:   Not appropriate for education at this time  Skin:  Skin Assessment: Skin Integrity Issues: Skin Integrity Issues:: Incisions Incisions: neck, abdomen  Last BM:  8/12  Height:   Ht Readings from Last 1 Encounters:  11/10/19 6' 1"  (1.854 m)    Weight:   Wt Readings from Last 1 Encounters:  11/21/19 73.7 kg    BMI:  Body mass index is 21.44 kg/m.  Estimated Nutritional Needs:   Kcal:  2200-2400  Protein:  110-120 grams  Fluid:  >/= 2 L/day    Melvin Whiteford AvLarkin Ina LDN RD pager number and  weekend/on-call pager number located in [email or web address]

## 2019-11-21 NOTE — Progress Notes (Signed)
Speech Language Pathology Treatment: Dysphagia  Patient Details Name: Marco Pittman MRN: [id number] DOB: 01/12/1961 Today's Date: 11/21/2019 Time: 0732-0806 SLP Time Calculation (min) (ACUTE ONLY): 34 min  Assessment / Plan / Recommendation Clinical Impression  Pt today denies pain, reports continued secretions - albeit now thin- requiring him to frequently suction- pt reports every few minutes.  Pt using PMSV for a few minutes at a time only due to air retention, suspect due to subglottic stenosis. He dons and doffs PMSV frequently during session - recommend pt continue to don/doff as needed. He states "I feel like I can breathe well with it on."  Advised suspect his stenosis contributes to only tolerating PMSV for a few minutes at a time.  Pt observed to consume ice chips from hand that had touched secretions.  Provided pt with alcohol gel and advised he only use clean hand for all intake due to risk of bacterial infiltration with min cues for reasoning.     Per pt interview, pt with h/o dysphagia - requiring liquids to help clear foods and occasional coughing with foods.  Pt reports dysphagive over the last year - gradual onset.  He can not isolate location of food retention requiring liquids to clear.  Pt admits he did not inform MD of this issue.  Also states he only consumed approximately 1/2 of his meals due to becoming "full" fast.    Observed pt consuming ice and water, no overt indication of aspiration however pt also with h/o SILENT aspiration.  Pt will benefit from repeat MBS prior to dc but recommend to wait until day of or day before release.  SLP advised pt to ongoing concerns for pt's swallow function to return to allowing safe po given premorbid dysphagia, aspiration pna.  Pt denies significant sensed aspiration event prior to July 2021 asp pna.   Advised pt to continue lingual press exercise which he demonstrated with mod I, he reports he conducted "some" last night.    HPI  HPI: 59 yo male smoker presented to ER 7/26 with worsening shortness of breath and chest discomfort. Found to have severe hypertension with SBP > 200.  CT chest showed b/l PE and RLL ASD with mucus plugging concerning for aspiration pneumonia. PMH: ETOH, HTN, Pancreatitis, Aneurysm. ETT 7/26-27; reintubated 7/27-29 due to angioedema. 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 Seen 11/03/19 post extubation with regular and nectar thick recommended and upgraded to thin liquids the following day.    Pt now with severe dysphagia noted on MBS testing x2.  His trach is now changed to a number 6 cuffless and thus he is able to move air around his trach and speak for a few seconds before breathstacking noted.      SLP Plan  Continue with current plan of care;Other (Comment);MBS (pt for PEG for nutrition)       Recommendations  Diet recommendations:  (water, ice) Liquids provided via: Cup;Teaspoon Medication Administration: Via alternative means Supervision: Patient able to self feed Compensations: Small sips/bites Postural Changes and/or Swallow Maneuvers: Seated upright 90 degrees      Patient may use Passy-Muir Speech Valve:  (pt able to don and remove independently) PMSV Supervision: Intermittent         Oral Care Recommendations: Oral care QID Follow up Recommendations: Other (comment) (tbd) SLP Visit Diagnosis: Dysphagia, pharyngoesophageal phase (R13.14);Aphonia (R49.1) Plan: Continue with current  plan of care;Other (Comment);MBS (pt for PEG for nutrition)       GO                Chales Abrahams 11/21/2019, 8:22 AM   Rolena Infante, MS [facility] SLP Acute Rehab Service Office [phone]

## 2019-11-21 NOTE — Progress Notes (Signed)
   Subjective:    Patient ID: Marco Pittman, male    DOB: 10-Jun-1960, 59 y.o.   MRN: [id number]  HPI Doing well with trach functioning.  Good Passy-Muir valve use.  G-tube in place.  Review of Systems     Objective:   Physical Exam AF VSS Alert, NAD #6 cuffless Shiley trach in place, decent voice with Passy-Muir valve in place, some air trapping, unable to breathe well with trach occluded     Assessment & Plan:  Glottic stenosis s/p tracheostomy  Awaiting inpatient rehab placement.  Plan follow-up with me as an outpatient to consider further airway management.  Do not decannulate in the meantime.

## 2019-11-21 NOTE — Progress Notes (Signed)
Marco Pittman  YCX:[id number] DOB: 1960-08-29 DOA: 11/09/2019 PCP: Marcine Matar, MD    Brief Narrative:  59 year old with a history of HTN, angioedema to ACEi requiring intubation, acute PE and aspiration pneumonia July 2021 requiring intubation, chronic alcohol abuse, and chronic pancreatitis who presented to the hospital 11/09/2019 with wheezing and stridor. ENT was emergently consulted in the ED and performed a bedside laryngoscopy that revealed glottic stenosis. The patient was taken to the OR for tracheostomy emergently.  Modified barium swallow on 8/6 and again on 8/9 revealed severe dysphagia and the patient underwent PEG tube placement 8/11.  The patient is currently medically stable but is awaiting placement in a skilled nursing facility for ongoing care.  Significant Events:  8/4 admit via [place] -tracheostomy by ENT 8/6 MBS with severe dysphagia need with 8/9 repeat MBS with severe dysphagia 8/11 PEG tube placed by general surgery in OR  Subjective: Remains medically stable for discharge.  Antimicrobials:  None  DVT prophylaxis: Eliquis  Assessment & Plan:  Acute hypoxic respiratory failure -subglottic tracheal stenosis Status post emergent tracheostomy per ENT 8/4 -doing well on room air via trach collar -trach care per PCCM  Pulmonary embolism July 2021 Continue Eliquis  Severe oropharyngeal dysphagia -idiopathic Confirmed via MBS x2 during this hospital stay -no acute findings on MRI brain -PEG tube placed by surgery 8/11 -tolerating tube feeds -consider repeat SLP evaluation as patient becomes stronger and more mobile  Essential HTN Blood pressure controlled  Alcohol abuse No withdrawal encountered during this hospital stay -empirically support with thiamine  Normocytic anemia Most likely due to bone marrow suppression of alcoholism as well as poor nutrition -no evidence of gross blood loss -B12 is borderline therefore will supplement -folate normal  -iron and iron binding capacity levels consistent with malnourished state  Homeless Patient will require placement in an SNF   Code Status: FULL CODE Family Communication:  Status is: Inpatient  Remains inpatient appropriate because:Unsafe d/c plan   Dispo: The patient is from: Home              Anticipated d/c is to: SNF              Anticipated d/c date is: 1 day              Patient currently is medically stable to d/c.  Consultants:  ENT PCCM  Objective: Blood pressure 115/78, pulse 88, temperature 99.3 F (37.4 C), temperature source Oral, resp. rate 18, height 6\' 1"  (1.854 m), weight 73.7 kg, SpO2 94 %.  Intake/Output Summary (Last 24 hours) at 11/21/2019 0930 Last data filed at 11/21/2019 0700 Gross per 24 hour  Intake 1118 ml  Output 2050 ml  Net -932 ml   Filed Weights   11/19/19 0300 11/20/19 0353 11/21/19 0300  Weight: 73.7 kg 73.7 kg 73.7 kg    Examination: No exam today - pt awaiting SNF placement   CBC: Recent Labs  Lab 11/17/19 0034 11/21/19 0130  WBC 7.2 6.5  HGB 11.7* 12.3*  HCT 33.4* 35.5*  MCV 89.1 89.0  PLT 296 291   Basic Metabolic Panel: Recent Labs  Lab 11/16/19 1530 11/17/19 0034 11/18/19 0115  NA 137 137 137  K 4.7 4.6 4.0  CL 105 107 103  CO2 22 21* 24  GLUCOSE 157* 127* 141*  BUN 9 10 9   CREATININE 0.95 0.84 0.85  CALCIUM 8.8* 8.7* 8.8*  MG  --  2.1 1.7  PHOS  --  3.5  3.9   GFR: Estimated Creatinine Clearance: 95.1 mL/min (by C-G formula based on SCr of 0.85 mg/dL).  Liver Function Tests: Recent Labs  Lab 11/17/19 0034 11/18/19 0115  ALBUMIN 2.4* 2.4*   HbA1C: Hemoglobin A1C  Date/Time Value Ref Range Status  05/05/2018 09:12 AM 5.9 (A) 4.0 - 5.6 % Final  10/10/2016 09:20 AM 6.1  Final   Hgb A1c MFr Bld  Date/Time Value Ref Range Status  10/18/2019 03:09 PM 5.9 (H) 4.8 - 5.6 % Final    Comment:             Prediabetes: 5.7 - 6.4          Diabetes: >6.4          Glycemic control for adults with  diabetes: <7.0   03/05/2016 05:43 AM 5.9 (H) 4.8 - 5.6 % Final    Comment:    (NOTE)         Pre-diabetes: 5.7 - 6.4         Diabetes: >6.4         Glycemic control for adults with diabetes: <7.0     CBG: Recent Labs  Lab 11/20/19 1613 11/20/19 2006 11/20/19 2342 11/21/19 0406 11/21/19 0748  GLUCAP 135* 120* 100* 156* 150*     Scheduled Meds: . apixaban  5 mg Per Tube BID  . Chlorhexidine Gluconate Cloth  6 each Topical Daily  . feeding supplement (PROSource TF)  45 mL Per Tube BID  . free water  200 mL Per Tube Q6H  . multivitamin  15 mL Per Tube Daily  . sennosides  5 mL Per Tube QHS  . thiamine  100 mg Per Tube Daily   Continuous Infusions: . feeding supplement (OSMOLITE 1.5 CAL) 1,000 mL (11/21/19 0131)     LOS: 12 days   Lonia Blood, MD [facility] Office  [phone] Pager - Text Page per Amion  If 7PM-7AM, please contact night-coverage per Amion 11/21/2019, 9:30 AM

## 2019-11-21 NOTE — Progress Notes (Signed)
NAME:  Marco Pittman, MRN:  [id number], DOB:  02-Feb-1961, LOS: 12 ADMISSION DATE:  11/09/2019, CONSULTATION DATE:  11/09/2019 REFERRING MD:  Dr. Pilar Plate, CHIEF COMPLAINT:  Subglottic stenosis    Brief History   59yo male with a PMH significant for hypertension, pancreatitis, ETOH abuse, aneurysm, angioedema, and PE who presented 8/4 with chief complaint of shortness of breath. Per patient the shortness of breath coupled with stridor began 2 days prior to admission and has progressively worsened.   On arrival patient was seen in respiratory distress with tachypnea and stridor. Initial labwork was unremarkable. Given presentation ENT was consulted in ED and preformed bedside fiberoptic exam that revealed imobile vocal cords with only slit glottic space for breathing therefore decision was made to take patient for emergent awake tracheostomy per ENT.   Of note patient has underwent multiple intubations and extubations over the last few weeks as follows: Admitted for angioedema requiring intubation 7/15-7/18, discharged 7/21 Readmitted 7/26 with SOB 2/2 aspiration PNA and PE intubated 7/26-7/27 Developed upper airway wheeze and required reintubation 7/27-7/29   Past Medical History  ETHO abuse Pancreatitis Hypertension PE Angioedema   Significant Hospital Events   8/04 Admit, underwent emergent awake trach for sub glottic stenosis  8/05 ATC 35%, SLP eval pending  8/09 MBS > NPO, Ice chips PRN   Consults:  ENT  Procedures:  Trach (awake, per ENT) 8/4 >>   Significant Diagnostic Tests:  DG soft neck 8/4 > negative  CXR 8/4 > negative  MBS 8/9 >> NPO, Ice chips PRN  Micro Data:  COVID 8/4 >> negative   Antimicrobials:     Interim history/subjective:   Patient doing well this morning.  On trach collar trial.  Able to place PMV and communicate.  Objective   Blood pressure 115/78, pulse 88, temperature 99.3 F (37.4 C), temperature source Oral, resp. rate 18, height 6\' 1"  (1.854  m), weight 73.7 kg, SpO2 94 %.    FiO2 (%):  [21 %-28 %] 21 %   Intake/Output Summary (Last 24 hours) at 11/21/2019 0908 Last data filed at 11/21/2019 0700 Gross per 24 hour  Intake 1118 ml  Output 2050 ml  Net -932 ml   Filed Weights   11/19/19 0300 11/20/19 0353 11/21/19 0300  Weight: 73.7 kg 73.7 kg 73.7 kg    Examination: General: Elderly male, resting comfortably in bed no distress HEENT: #6 cuffless Shiley in place, minimal secretions Neuro: Awake alert follows commands CV: Regular rate rhythm, S1-S2 PULM: Clear secretions from tracheostomy site, able to cough and clear them on his own.  Able to use suction device.  No crackles no wheeze GI: Soft, nontender nondistended Extremities: No significant edema Skin: No rash  Resolved Hospital Problem list     Assessment & Plan:   Subglottic stenosis  Acute Respiratory Insuffiency secondary to Subglottic Stenosis  Tracheostomy Status  In the setting of multiple intubations and extubations over the last few weeks. S/P emergent tracheostomy per ENT.  -Continue routine trach care -Continue aspiration precautions -Continue work with SLP  Home Trach / Discharge Needs: -DME ordered for discharge will need to include: 6.  Shiley cuffless trach with a backup #4 cuffless trach, suction, trach supplies to include gauze and cleaning supplies as well as an additional PMV. He will also need outpatient SLP follow-up. And patient can follow-up in the trach clinic with 11/23/19, ACNP [phone].  Recent PE -Anticoagulation per primary.  Hx of Hypertension Home medications include Coreg, and norvasc  HX of ETOH abuse  -Per primary  At Risk Malnutrition  -TF per primary / will likely need cortrack placement  -unfortunately, he failed his MBS 8/9 with gross aspiration without awarenss and will need PEG for more long term support.     Case discussed with Dr. Sharon Seller.  Patient awaiting placement.  Pulmonary will continue  to follow once weekly or as needed for trach care needs.  Do not hesitate to call with any questions or concerns.  Best practice:  Diet: NPO  Pain/Anxiety/Delirium protocol (if indicated): n/a VAP protocol (if indicated): n/a DVT prophylaxis: lovenox until able to take po GI prophylaxis: PPI Glucose control: SSI Mobility: as tolerated   Code Status: Full Family Communication: Patient updated 8/9.  Disposition: per [facility], PCCM will continue to follow for trach care.   Labs   CBC: Recent Labs  Lab 11/17/19 0034 11/21/19 0130  WBC 7.2 6.5  HGB 11.7* 12.3*  HCT 33.4* 35.5*  MCV 89.1 89.0  PLT 296 291    Basic Metabolic Panel: Recent Labs  Lab 11/16/19 1530 11/17/19 0034 11/18/19 0115  NA 137 137 137  K 4.7 4.6 4.0  CL 105 107 103  CO2 22 21* 24  GLUCOSE 157* 127* 141*  BUN 9 10 9   CREATININE 0.95 0.84 0.85  CALCIUM 8.8* 8.7* 8.8*  MG  --  2.1 1.7  PHOS  --  3.5 3.9   GFR: Estimated Creatinine Clearance: 95.1 mL/min (by C-G formula based on SCr of 0.85 mg/dL). Recent Labs  Lab 11/17/19 0034 11/21/19 0130  WBC 7.2 6.5    Liver Function Tests: Recent Labs  Lab 11/17/19 0034 11/18/19 0115  ALBUMIN 2.4* 2.4*   No results for input(s): LIPASE, AMYLASE in the last 168 hours. No results for input(s): AMMONIA in the last 168 hours.  ABG    Component Value Date/Time   PHART 7.379 11/01/2019 1337   PCO2ART 50.2 (H) 11/01/2019 1337   PO2ART 309 (H) 11/01/2019 1337   HCO3 29.8 (H) 11/01/2019 1337   TCO2 30 11/09/2019 1551   O2SAT 100.0 11/01/2019 1337     Coagulation Profile: No results for input(s): INR, PROTIME in the last 168 hours.  Cardiac Enzymes: No results for input(s): CKTOTAL, CKMB, CKMBINDEX, TROPONINI in the last 168 hours.  HbA1C: Hemoglobin A1C  Date/Time Value Ref Range Status  05/05/2018 09:12 AM 5.9 (A) 4.0 - 5.6 % Final  10/10/2016 09:20 AM 6.1  Final   Hgb A1c MFr Bld  Date/Time Value Ref Range Status  10/18/2019 03:09 PM 5.9 (H)  4.8 - 5.6 % Final    Comment:             Prediabetes: 5.7 - 6.4          Diabetes: >6.4          Glycemic control for adults with diabetes: <7.0   03/05/2016 05:43 AM 5.9 (H) 4.8 - 5.6 % Final    Comment:    (NOTE)         Pre-diabetes: 5.7 - 6.4         Diabetes: >6.4         Glycemic control for adults with diabetes: <7.0     CBG: Recent Labs  Lab 11/20/19 1613 11/20/19 2006 11/20/19 2342 11/21/19 0406 11/21/19 0748  GLUCAP 135* 120* 100* 156* 150*        11/23/19, DO [place] 11/21/2019 9:08 AM

## 2019-11-22 DIAGNOSIS — R1310 Dysphagia, unspecified: Secondary | ICD-10-CM | POA: Diagnosis not present

## 2019-11-22 DIAGNOSIS — J386 Stenosis of larynx: Secondary | ICD-10-CM | POA: Diagnosis not present

## 2019-11-22 LAB — GLUCOSE, CAPILLARY
Glucose-Capillary: 114 mg/dL — ABNORMAL HIGH (ref 70–99)
Glucose-Capillary: 154 mg/dL — ABNORMAL HIGH (ref 70–99)
Glucose-Capillary: 103 mg/dL — ABNORMAL HIGH (ref 70–99)
Glucose-Capillary: 106 mg/dL — ABNORMAL HIGH (ref 70–99)
Glucose-Capillary: 96 mg/dL (ref 70–99)
Glucose-Capillary: 144 mg/dL — ABNORMAL HIGH (ref 70–99)

## 2019-11-22 NOTE — Progress Notes (Signed)
Marco Pittman  [id number]:[id number] DOB: 23-May-1960 DOA: 11/09/2019 PCP: Marcine Matar, MD    Brief Narrative:  59 year old with a history of HTN, angioedema to ACEi requiring intubation, acute PE and aspiration pneumonia July 2021 requiring intubation, chronic alcohol abuse, and chronic pancreatitis who presented to the hospital 11/09/2019 with wheezing and stridor. ENT was emergently consulted in the ED and performed a bedside laryngoscopy that revealed glottic stenosis. The patient was taken to the OR for tracheostomy emergently.  Modified barium swallow on 8/6 and again on 8/9 revealed severe dysphagia and the patient underwent PEG tube placement 8/11.  The patient is currently medically stable but is awaiting placement in a skilled nursing facility for ongoing care.  Significant Events:  8/4 admit via [place] -tracheostomy by ENT 8/6 MBS with severe dysphagia need with 8/9 repeat MBS with severe dysphagia 8/11 PEG tube placed by general surgery in OR  Subjective: Remains medically stable for discharge. Resting comfortably in his room with no complaints.  Antimicrobials:  None  DVT prophylaxis: Eliquis  Assessment & Plan:  Acute hypoxic respiratory failure - glottic tracheal stenosis Status post emergent tracheostomy per ENT 8/4 -doing well on room air via trach collar -trach care per PCCM  Pulmonary embolism July 2021 Continue Eliquis  Severe oropharyngeal dysphagia -idiopathic Confirmed via MBS x2 during this hospital stay -no acute findings on MRI brain -PEG tube placed by surgery 8/11 -tolerating tube feeds -consider repeat SLP evaluation as patient becomes stronger and more mobile  Essential HTN Blood pressure controlled  Alcohol abuse No withdrawal encountered during this hospital stay -empirically support with thiamine  Normocytic anemia Most likely due to bone marrow suppression of alcoholism as well as poor nutrition -no evidence of gross blood loss -B12 is  borderline therefore will supplement -folate normal -iron and iron binding capacity levels consistent with malnourished state  Homeless Patient will require placement in a SNF   Code Status: FULL CODE Family Communication:  Status is: Inpatient  Remains inpatient appropriate because:Unsafe d/c plan   Dispo: The patient is from: Home              Anticipated d/c is to: SNF              Anticipated d/c date is: 1 day              Patient currently is medically stable to d/c.  Consultants:  ENT PCCM  Objective: Blood pressure 110/78, pulse 88, temperature 98.9 F (37.2 C), resp. rate 18, height 6\' 1"  (1.854 m), weight 73.7 kg, SpO2 95 %.  Intake/Output Summary (Last 24 hours) at 11/22/2019 0927 Last data filed at 11/22/2019 0432 Gross per 24 hour  Intake --  Output 1450 ml  Net -1450 ml   Filed Weights   11/19/19 0300 11/20/19 0353 11/21/19 0300  Weight: 73.7 kg 73.7 kg 73.7 kg    Examination: General: No acute respiratory distress Lungs: CTA B Cardiovascular: RRR w/o M   CBC: Recent Labs  Lab 11/17/19 0034 11/21/19 0130  WBC 7.2 6.5  HGB 11.7* 12.3*  HCT 33.4* 35.5*  MCV 89.1 89.0  PLT 296 291   Basic Metabolic Panel: Recent Labs  Lab 11/16/19 1530 11/17/19 0034 11/18/19 0115  NA 137 137 137  K 4.7 4.6 4.0  CL 105 107 103  CO2 22 21* 24  GLUCOSE 157* 127* 141*  BUN 9 10 9   CREATININE 0.95 0.84 0.85  CALCIUM 8.8* 8.7* 8.8*  MG  --  2.1 1.7  PHOS  --  3.5 3.9   GFR: Estimated Creatinine Clearance: 95.1 mL/min (by C-G formula based on SCr of 0.85 mg/dL).  Liver Function Tests: Recent Labs  Lab 11/17/19 0034 11/18/19 0115  ALBUMIN 2.4* 2.4*   HbA1C: Hemoglobin A1C  Date/Time Value Ref Range Status  05/05/2018 09:12 AM 5.9 (A) 4.0 - 5.6 % Final  10/10/2016 09:20 AM 6.1  Final   Hgb A1c MFr Bld  Date/Time Value Ref Range Status  10/18/2019 03:09 PM 5.9 (H) 4.8 - 5.6 % Final    Comment:             Prediabetes: 5.7 - 6.4           Diabetes: >6.4          Glycemic control for adults with diabetes: <7.0   03/05/2016 05:43 AM 5.9 (H) 4.8 - 5.6 % Final    Comment:    (NOTE)         Pre-diabetes: 5.7 - 6.4         Diabetes: >6.4         Glycemic control for adults with diabetes: <7.0     CBG: Recent Labs  Lab 11/21/19 1626 11/21/19 1941 11/21/19 2308 11/22/19 0307 11/22/19 0732  GLUCAP 135* 109* 124* 114* 154*     Scheduled Meds: . apixaban  5 mg Per Tube BID  . Chlorhexidine Gluconate Cloth  6 each Topical Daily  . feeding supplement (PROSource TF)  45 mL Per Tube BID  . free water  200 mL Per Tube Q6H  . multivitamin  15 mL Per Tube Daily  . sennosides  5 mL Per Tube QHS  . thiamine  100 mg Per Tube Daily   Continuous Infusions: . feeding supplement (OSMOLITE 1.5 CAL) 1,000 mL (11/21/19 0131)     LOS: 13 days   Lonia Blood, MD [facility] Office  [phone] Pager - Text Page per Amion  If 7PM-7AM, please contact night-coverage per Amion 11/22/2019, 9:27 AM

## 2019-11-23 ENCOUNTER — Inpatient Hospital Stay (HOSPITAL_COMMUNITY): Payer: Medicaid Other

## 2019-11-23 LAB — GLUCOSE, CAPILLARY
Glucose-Capillary: 99 mg/dL (ref 70–99)
Glucose-Capillary: 132 mg/dL — ABNORMAL HIGH (ref 70–99)
Glucose-Capillary: 149 mg/dL — ABNORMAL HIGH (ref 70–99)
Glucose-Capillary: 130 mg/dL — ABNORMAL HIGH (ref 70–99)
Glucose-Capillary: 132 mg/dL — ABNORMAL HIGH (ref 70–99)
Glucose-Capillary: 105 mg/dL — ABNORMAL HIGH (ref 70–99)

## 2019-11-23 MED ORDER — SODIUM CHLORIDE 0.9% FLUSH: 3.0000 mL | INTRAVENOUS | Status: DC | PRN

## 2019-11-23 MED ORDER — SODIUM CHLORIDE 0.9% FLUSH
3.0000 mL | Freq: Two times a day (BID) | INTRAVENOUS | Status: DC
  Administered 2019-11-23 – 2019-12-06 (×14): 3 mL via INTRAVENOUS

## 2019-11-23 MED ORDER — RESOURCE THICKENUP CLEAR PO POWD
ORAL | Status: DC | PRN
  Filled 2019-11-23: qty 125

## 2019-11-23 NOTE — Progress Notes (Signed)
Modified Barium Swallow Progress Note  Patient Details  Name: Marco Pittman MRN: [id number] Date of Birth: 1960-06-22  Today's Date: 11/23/2019  Modified Barium Swallow completed.  Full report located under Chart Review in the Imaging Section.  Brief recommendations include the following:  Clinical Impression  Pt's swallow function has improved with decreased pharyngeal edema noted. Study performed with PMV doffed. Orally his control and propulsion was adequate and timely with minimal tongue base residue. Pt's anterior hyolaryngeal excursion and elevation appeared reduced but functional leaving minimal pyriform sinus residue. Laryngeal closure, however, was mistimed and pt aspirated honey thick from either pyriform sinus residue or interarytenoid residue without immediate sensation. Penetration present high in vestibule with puree. Compensatory strategies using chin tuck and breath hold (isolated) was not an effective measure for pt to use with any consistency liquid. Pt did have a delayed cough to aspiration with barium expelled from trach. Recommend Keiji initiate Dys 1 (puree), pudding thick liquids (nothing thinner than puree/pudding), meds via PEG and hard throat clear intermittently. Continue ST while here and recommend continue at SNF.      Swallow Evaluation Recommendations       SLP Diet Recommendations: Dysphagia 1 (Puree) solids;Pudding thick liquid       Medication Administration: Via alternative means       Compensations: Small sips/bites;Slow rate;Clear throat intermittently   Postural Changes: Seated upright at 90 degrees   Oral Care Recommendations: Oral care BID   Other Recommendations: Order thickener from pharmacy    [name] 11/23/2019,4:04 PM  [name].Ed Nurse, children's [phone] Office [phone]

## 2019-11-23 NOTE — Progress Notes (Signed)
  Speech Language Pathology  Patient Details Name: Marco Pittman MRN: [id number] DOB: 08-27-1960 Today's Date: 11/23/2019 Time:  -       Radiology can fit pt in at 1430 today for MBS- (plan on 1430 today)    [name] 11/23/2019, 12:59 PM   [name].Ed Nurse, children's [phone] Office [phone]

## 2019-11-23 NOTE — Plan of Care (Signed)

## 2019-11-23 NOTE — Progress Notes (Signed)
PROGRESS NOTE    DAVARIS Pittman  BHA:[id number] DOB: Mar 28, 1961 DOA: 11/09/2019 PCP: Marcine Matar, MD   Brief Narrative: Marco Pittman is a 59 y.o. male with history of hypertension, chronic pancreatitis, tobacco/alcohol abuse, primary biliary cirrhosis. Patient presented secondary to shortness of breath in the setting of glottic stenosis requiring emergent tracheostomy.   Assessment & Plan:   Active Problems:   Essential hypertension, benign   History of alcohol abuse   Acute respiratory failure with hypoxia (HCC)   Glottic stenosis   Dysphagia   Acute respiratory failure with hypoxia Respiratory distress Secondary to glottic stenosis. Patient underwent emergent tracheostomy on 8/11 by ENT. Currently stable on tracheostomy collar with O2 at 5 Lpm  Glottic stenosis S/p tracheostomy as mentioned above  History of pulmonary embolism Patient is on Eliquis as an outpatient -Continue Eliquis  Severe oropharyngeal dysphagia Patient followed by SLP. PEG tube placed by general surgery on 8/11 and patient has been tolerating tube feeds. -SLP recommendations: plan for repeat MBS  Essential hypertension On amlodipine and Coreg as an outpatient. Normotensive. Amlodipine and Coreg held. -Continue to hold home antihypertensives  Alcohol abuse Patient with no evidence of withdrawal. On thiamine as an outpatient -Continue thiamine  Normocytic anemia Mild and stable.  Chronic pancreatitis Appears stable. Asymptomatic.   DVT prophylaxis: Eliquis Code Status:   Code Status: Full Code Family Communication: None at bedside Disposition Plan: Plan to discharge to SNF. Patient has a bed offer and is awaiting insurance authorization. SLP re-evaluating with MBS but otherwise patient is medically stable for discharge.   Consultants:   ENT  PCCM  Procedures:   AWAKE TRACHEOSTOMY (11/09/2019)  EGD AND PEG TUBE PLACEMENT (11/16/2019)  Antimicrobials:  None     Subjective: No issues overnight  Objective: Vitals:   11/22/19 2328 11/23/19 0433 11/23/19 0740 11/23/19 0742  BP:   117/79   Pulse: 88 95 100 100  Resp: 18 17 16 16   Temp:   99.2 F (37.3 C)   TempSrc:      SpO2: 93% 94% 95% 93%  Weight:      Height:        Intake/Output Summary (Last 24 hours) at 11/23/2019 1044 Last data filed at 11/23/2019 0843 Gross per 24 hour  Intake 3396 ml  Output 1820 ml  Net 1576 ml   Filed Weights   11/19/19 0300 11/20/19 0353 11/21/19 0300  Weight: 73.7 kg 73.7 kg 73.7 kg    Examination:  General exam: Appears calm and comfortable Respiratory system: Rhonchi on right, otherwise clear. Respiratory effort normal. Cardiovascular system: S1 & S2 heard, RRR. No murmurs, rubs, gallops or clicks. Gastrointestinal system: Abdomen is slightly distended, soft and nontender. No organomegaly or masses felt. Normal bowel sounds heard. G-tube site appears normal Central nervous system: Alert and oriented. No focal neurological deficits. Musculoskeletal: No edema. No calf tenderness Skin: No cyanosis. No rashes Psychiatry: Judgement and insight appear normal. Mood & affect appropriate.     Data Reviewed: I have personally reviewed following labs and imaging studies  CBC Lab Results  Component Value Date   WBC 6.5 11/21/2019   RBC 4.00 (L) 11/21/2019   RBC 3.99 (L) 11/21/2019   HGB 12.3 (L) 11/21/2019   HCT 35.5 (L) 11/21/2019   MCV 89.0 11/21/2019   MCH 30.8 11/21/2019   PLT 291 11/21/2019   MCHC 34.6 11/21/2019   RDW 12.2 11/21/2019   LYMPHSABS 1.5 11/09/2019   MONOABS 0.7 11/09/2019   EOSABS 0.1 11/09/2019  BASOSABS 0.0 11/09/2019     Last metabolic panel Lab Results  Component Value Date   NA 137 11/18/2019   K 4.0 11/18/2019   CL 103 11/18/2019   CO2 24 11/18/2019   BUN 9 11/18/2019   CREATININE 0.85 11/18/2019   GLUCOSE 141 (H) 11/18/2019   GFRNONAA >60 11/18/2019   GFRAA >60 11/18/2019   CALCIUM 8.8 (L) 11/18/2019    PHOS 3.9 11/18/2019   PROT 7.2 11/09/2019   ALBUMIN 2.4 (L) 11/18/2019   LABGLOB 3.8 10/18/2019   AGRATIO 1.2 10/18/2019   BILITOT 0.3 11/09/2019   ALKPHOS 70 11/09/2019   AST 20 11/09/2019   ALT 30 11/09/2019   ANIONGAP 10 11/18/2019    CBG (last 3)  Recent Labs    11/22/19 2302 11/23/19 0304 11/23/19 0740  GLUCAP 144* 99 132*     GFR: Estimated Creatinine Clearance: 95.1 mL/min (by C-G formula based on SCr of 0.85 mg/dL).  Coagulation Profile: No results for input(s): INR, PROTIME in the last 168 hours.  No results found for this or any previous visit (from the past 240 hour(s)).      Radiology Studies: No results found.      Scheduled Meds: . apixaban  5 mg Per Tube BID  . feeding supplement (PROSource TF)  45 mL Per Tube BID  . free water  200 mL Per Tube Q6H  . multivitamin  15 mL Per Tube Daily  . sennosides  5 mL Per Tube QHS  . sodium chloride flush  3 mL Intravenous Q12H  . thiamine  100 mg Per Tube Daily   Continuous Infusions: . feeding supplement (OSMOLITE 1.5 CAL) 1,000 mL (11/23/19 0532)     LOS: 14 days     Jacquelin Hawking, MD [facility] 11/23/2019, 10:44 AM  If 7PM-7AM, please contact night-coverage [email or web address]

## 2019-11-23 NOTE — TOC Progression Note (Addendum)
Transition of Care [facility]) - Progression Note    Patient Details  Name: Marco Pittman MRN: [id number] Date of Birth: 1961/02/21  [facility]) CM/SW Contact  Lorri Frederick, LCSW Phone Number: 11/23/2019, 9:23 AM  Clinical Narrative:   Bed offer from [facility].  CSW discussed with pt, who wants to accept. 0900: attempted to call [place], Darel Hong in a meeting.   1200: spoke to [place], [place].  She will initiate authorization.    Expected Discharge Plan: Home w Home Health Services Barriers to Discharge: Continued Medical Work up, English as a second language teacher, Equipment Delay  Expected Discharge Plan and Services Expected Discharge Plan: Home w Home Health Services     Post Acute Care Choice: Home Health Living arrangements for the past 2 months: Single Family Home Expected Discharge Date: 11/17/19                                     Social Determinants of Health (SDOH) Interventions    Readmission Risk Interventions No flowsheet data found.

## 2019-11-23 NOTE — Progress Notes (Signed)
°  Speech Language Pathology  Patient Details Name: Marco Pittman MRN: [id number] DOB: [birth year] Today's Date: 11/23/2019 Time:  -     Plan to repeat pt's MBS tomorrow morning 8/19 prior to discharge although that date is unknown (social work note seen- Dr. Caleb Popp says he will not be discharging today)              [name] 11/23/2019, 10:44 AM   [name].Ed Nurse, children's [phone] Office [phone]

## 2019-11-24 LAB — GLUCOSE, CAPILLARY
Glucose-Capillary: 110 mg/dL — ABNORMAL HIGH (ref 70–99)
Glucose-Capillary: 105 mg/dL — ABNORMAL HIGH (ref 70–99)
Glucose-Capillary: 109 mg/dL — ABNORMAL HIGH (ref 70–99)
Glucose-Capillary: 127 mg/dL — ABNORMAL HIGH (ref 70–99)
Glucose-Capillary: 142 mg/dL — ABNORMAL HIGH (ref 70–99)

## 2019-11-24 NOTE — Progress Notes (Signed)
  Speech Language Pathology Treatment: Dysphagia;Passy Muir Speaking valve  Patient Details Name: Marco Pittman MRN: [id number] DOB: 04/12/1960 Today's Date: 11/24/2019 Time: 0922-0932 SLP Time Calculation (min) (ACUTE ONLY): 10 min  Assessment / Plan / Recommendation Clinical Impression  Pt is doing excellent this morning in regards to swallow and phonation. He reports he ate breakfast with only one cough and he can wear speaking valve for longer periods. He donned valve and was breathing fine so continued to wear throughout meal- prior he has experienced air trapping. Observed pt consume approximately 2 oz pudding with increasingly yet mild throat clearing that improved with volitional throat clears. Reviewed recommendations after yesterday's MBS and likely plan for continuation of dysphagia intervention with SLP at SNF. He will need a repeat MBS to determine initiation of liquids. Recommend continue puree, pudding thick liquids, wear speaking valve with meals as tolerated. Plans to discharge to SNF today.    HPI HPI: 59 yo male smoker presented to ER 7/26 with worsening shortness of breath and chest discomfort. Found to have severe hypertension with SBP > 200.  CT chest showed b/l PE and RLL ASD with mucus plugging concerning for aspiration pneumonia. PMH: ETOH, HTN, Pancreatitis, Aneurysm. ETT 7/26-27; reintubated 7/27-29 due to angioedema. 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 Seen 11/03/19 post extubation with regular and nectar thick recommended and upgraded to thin liquids the following day.    Pt now with severe dysphagia noted on MBS testing x2.  His trach is now changed to a number 6 cuffless and thus he is able to move air around his trach and speak for a few seconds before breathstacking noted. MBS 8/18 recommended puree and pudding thick liquids.        SLP Plan  Continue with current plan of care       Recommendations  Diet recommendations: Pudding-thick liquid;Dysphagia 1 (puree) Liquids provided via: Teaspoon Medication Administration:  (tube or crushed in puree) Supervision: Patient able to self feed Compensations: Small sips/bites;Slow rate;Clear throat intermittently Postural Changes and/or Swallow Maneuvers: Seated upright 90 degrees      Patient may use Passy-Muir Speech Valve: with SLP only PMSV Supervision: Intermittent         Oral Care Recommendations: Oral care BID Follow up Recommendations: Skilled Nursing facility SLP Visit Diagnosis: Dysphagia, pharyngeal phase (R13.13) Plan: Continue with current plan of care       GO                [name] 11/24/2019, 12:18 PM  [name].Ed Nurse, children's [phone] Office [phone]

## 2019-11-24 NOTE — Plan of Care (Signed)

## 2019-11-24 NOTE — Progress Notes (Signed)
Nutrition Follow-up  DOCUMENTATION CODES:   Not applicable  INTERVENTION:  Recommend initiation of bowel regimen; no BM documented since 8/12.   Continue via PEG: -Osmolite 1.5 cal @ 78m/hr -459mProsource TF BID -20086mree water Q6H  Tube feeding regimen provides2240kcal (100% of needs),112grams of protein, and 1894m76m H2O.   NUTRITION DIAGNOSIS:   Inadequate oral intake related to inability to eat as evidenced by NPO status.  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 Pt underwent emergent awake tracheostomy 8/4.  8/6 Cortrak placed (gastric) 8/9 failed MBS, Cortrak removed 8/11 s/p EGD and PEG placement 8/18 MBS, dysphagia 1 with pudding thick liquids ordered   Pt is pending placement in SNF. Pt had repeat MBS yesterday and is now on a dysphagia 1 diet with pudding thick liquids. Of note, given the limitations in supplement options for pudding thick liquids, would not recommend discontinuing tube feeding until pt can consistently meet >75% estimated needs via po intake.   Current TF via PEG: Osmolite 1.5 cal @ 60ml1m 45ml 39mource TF BID, 200ml f23mwater Q6H  Admit wt: 74.2kg Current wt: 73.7kg  UOP: 2,140ml x222murs I/O: -7,254.8ml sinc37mdmit  Labs: CBGs 105-132 Medications: MVI, Senokot, Thiamine  Diet Order:   Diet Order            DIET - DYS 1 Room service appropriate? No; Fluid consistency: Pudding Thick  Diet effective now                 EDUCATION NEEDS:   Not appropriate for education at this time  Skin:  Skin  Assessment: Skin Integrity Issues: Skin Integrity Issues:: Incisions Incisions: neck, abdomen  Last BM:  8/12  Height:   Ht Readings from Last 1 Encounters:  11/10/19 _0  (1.854 m)    Weight:   Wt Readings from Last 1 Encounters:  11/21/19 73.7 kg    BMI:  Body mass index is 21.44 kg/m.  Estimated Nutritional Needs:   Kcal:  2200-2400  Protein:  110-120 grams  Fluid:  >/= 2 L/day    Ohn Bostic AvLarkin Ina LDN RD pager number and weekend/on-call pager number located in [email or web address]

## 2019-11-24 NOTE — Progress Notes (Signed)
PROGRESS NOTE    Marco SavoyMichael W Pittman  ZOX:[id number] DOB: 05/07/1960 DOA: 11/09/2019 PCP: Marcine MatarJohnson, Marco B, MD   Brief Narrative: Marco Pittman is a 59 y.o. male with history of hypertension, chronic pancreatitis, tobacco/alcohol abuse, primary biliary cirrhosis. Patient presented secondary to shortness of breath in the setting of glottic stenosis requiring emergent tracheostomy.   Assessment & Plan:   Active Problems:   Essential hypertension, benign   History of alcohol abuse   Acute respiratory failure with hypoxia (HCC)   Glottic stenosis   Dysphagia   Acute respiratory failure with hypoxia Respiratory distress Secondary to glottic stenosis. Patient underwent emergent tracheostomy on 8/11 by ENT. Currently stable on tracheostomy collar with O2 at 5 Lpm  Glottic stenosis S/p tracheostomy as mentioned above  History of pulmonary embolism Patient is on Eliquis as an outpatient -Continue Eliquis  Severe oropharyngeal dysphagia Patient followed by SLP. PEG tube placed by general surgery on 8/11 and patient has been tolerating tube feeds. Repeat MBS performed on 8/18 and was advanced to Dysphagia 1 diet -SLP recommendations:  Dysphagia 1 (Puree) solids;Pudding thick liquid Medication Administration: Via alternative means Compensations: Small sips/bites;Slow rate;Clear throat intermittently Postural Changes: Seated upright at 90 degrees Oral Care Recommendations: Oral care BID Other Recommendations: Order thickener from pharmacy  Essential hypertension On amlodipine and Coreg as an outpatient. Normotensive. Amlodipine and Coreg held. -Continue to hold home antihypertensives  Alcohol abuse Patient with no evidence of withdrawal. On thiamine as an outpatient -Continue thiamine  Normocytic anemia Mild and stable.  Chronic pancreatitis Appears stable. Asymptomatic.   DVT prophylaxis: Eliquis Code Status:   Code Status: Full Code Family Communication: None at  bedside Disposition Plan: Plan to discharge to SNF. Patient has a bed offer and is awaiting insurance authorization. SLP re-evaluating with MBS but otherwise patient is medically stable for discharge.   Consultants:   ENT  PCCM  Procedures:   AWAKE TRACHEOSTOMY (11/09/2019)  EGD AND PEG TUBE PLACEMENT (11/16/2019)  Antimicrobials:  None    Subjective: No concerns today. No dyspnea  Objective: Vitals:   11/23/19 2326 11/24/19 0058 11/24/19 0357 11/24/19 0756  BP:      Pulse: 92  92 78  Resp: 20  19 18   Temp:  99.8 F (37.7 C)    TempSrc:      SpO2: 96%  94% 95%  Weight:      Height:        Intake/Output Summary (Last 24 hours) at 11/24/2019 1056 Last data filed at 11/24/2019 0603 Gross per 24 hour  Intake 1780 ml  Output 1540 ml  Net 240 ml   Filed Weights   11/19/19 0300 11/20/19 0353 11/21/19 0300  Weight: 73.7 kg 73.7 kg 73.7 kg    Examination:  General exam: Appears calm and comfortable Respiratory system: Mild rhonchi. Respiratory effort normal. Cardiovascular system: S1 & S2 heard, RRR. No murmurs, rubs, gallops or clicks. Gastrointestinal system: Abdomen is slightly distended, soft and nontender. No organomegaly or masses felt. Normal bowel sounds heard. Central nervous system: Alert and oriented. No focal neurological deficits. Musculoskeletal: No edema. No calf tenderness Skin: No cyanosis. No rashes Psychiatry: Judgement and insight appear normal. Mood & affect appropriate.     Data Reviewed: I have personally reviewed following labs and imaging studies  CBC Lab Results  Component Value Date   WBC 6.5 11/21/2019   RBC 4.00 (L) 11/21/2019   RBC 3.99 (L) 11/21/2019   HGB 12.3 (L) 11/21/2019   HCT 35.5 (L) 11/21/2019  MCV 89.0 11/21/2019   MCH 30.8 11/21/2019   PLT 291 11/21/2019   MCHC 34.6 11/21/2019   RDW 12.2 11/21/2019   LYMPHSABS 1.5 11/09/2019   MONOABS 0.7 11/09/2019   EOSABS 0.1 11/09/2019   BASOSABS 0.0 11/09/2019      Last metabolic panel Lab Results  Component Value Date   NA 137 11/18/2019   K 4.0 11/18/2019   CL 103 11/18/2019   CO2 24 11/18/2019   BUN 9 11/18/2019   CREATININE 0.85 11/18/2019   GLUCOSE 141 (H) 11/18/2019   GFRNONAA >60 11/18/2019   GFRAA >60 11/18/2019   CALCIUM 8.8 (L) 11/18/2019   PHOS 3.9 11/18/2019   PROT 7.2 11/09/2019   ALBUMIN 2.4 (L) 11/18/2019   LABGLOB 3.8 10/18/2019   AGRATIO 1.2 10/18/2019   BILITOT 0.3 11/09/2019   ALKPHOS 70 11/09/2019   AST 20 11/09/2019   ALT 30 11/09/2019   ANIONGAP 10 11/18/2019    CBG (last 3)  Recent Labs    11/23/19 2315 11/24/19 0318 11/24/19 0735  GLUCAP 105* 110* 105*     GFR: Estimated Creatinine Clearance: 95.1 mL/min (by C-G formula based on SCr of 0.85 mg/dL).  Coagulation Profile: No results for input(s): INR, PROTIME in the last 168 hours.  No results found for this or any previous visit (from the past 240 hour(s)).      Radiology Studies: DG Swallowing Func-Speech Pathology  Result Date: 11/23/2019 Objective Swallowing Evaluation: Type of Study: MBS-Modified Barium Swallow Study  Patient Details Name: Marco Pittman MRN: [id number] Date of Birth: March 14, 1961 Today's Date: 11/23/2019 Time: SLP Start Time (ACUTE ONLY): 1448 -SLP Stop Time (ACUTE ONLY): 1506 SLP Time Calculation (min) (ACUTE ONLY): 18 min Past Medical History: Past Medical History: Diagnosis Date  Aneurysm (HCC)   ETOH abuse   Hypertension   Pancreatitis 03/04/2016  Umbilical hernia   symptomatic umbilical hernia/notes 03/04/2016 Past Surgical History: Past Surgical History: Procedure Laterality Date  GASTROSTOMY N/A 11/16/2019  Procedure: INSERTION OF GASTROSTOMY TUBE,;  Surgeon: Diamantina Monks, MD;  Location: [facility] OR;  Service: General;  Laterality: N/A;  INSERTION OF MESH N/A 04/14/2016  Procedure: INSERTION OF MESH;  Surgeon: Avel Peace, MD;  Location: [facility];  Service: General;  Laterality: N/A;   MICROLARYNGOSCOPY N/A 11/09/2019  Procedure: MICROLARYNGOSCOPY;  Surgeon: [name], MD;  Location: [facility] OR;  Service: ENT;  Laterality: N/A;  TRACHEOSTOMY TUBE PLACEMENT N/A 11/09/2019  Procedure: AWAKE TRACHEOSTOMY;  Surgeon: [name], MD;  Location: [facility] OR;  Service: ENT;  Laterality: N/A;  UMBILICAL HERNIA REPAIR N/A 04/14/2016  Procedure: UMBILICAL HERNIA REPAIR WITH MESH;  Surgeon: Avel Peace, MD;  Location: [facility];  Service: General;  Laterality: N/A; HPI: 59 yo male smoker presented to ER 7/26 with worsening shortness of breath and chest discomfort. Found to have severe hypertension with SBP > 200.  CT chest showed b/l PE and RLL ASD with mucus plugging concerning for aspiration pneumonia. PMH: ETOH, HTN, Pancreatitis, Aneurysm. ETT 7/26-27; reintubated 7/27-29 due to angioedema. 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 Seen 11/03/19 post extubation with regular and nectar thick recommended and upgraded to thin liquids the following day.    Pt now with severe dysphagia noted on MBS testing x2.  His trach is now changed to a number 6 cuffless and thus he is able to move air around his trach and  speak for a few seconds before breathstacking noted. Repeating MBS prior to discharge tomorrow.  Subjective: pt awake in bed Assessment / Plan / Recommendation CHL IP CLINICAL IMPRESSIONS 11/23/2019 Clinical Impression Pt's swallow function has improved with decreased pharyngeal edema noted. Study performed with PMV doffed. Orally his control and propulsion was adequate and timely with minimal tongue base residue. Pt's anterior hyolaryngeal excursion and elevation appeared reduced but functional leaving minimal pyriform sinus residue. Laryngeal closure, however, was mistimed and pt aspirated honey thick from either pyriform sinus residue or interarytenoid  residue without immediate sensation. Penetration present high in vestibule with puree. Compensatory strategies using chin tuck and breath hold (isolated) was not an effective measure for pt to use with any consistency liquid. Pt did have a delayed cough to aspiration with barium expelled from trach. Recommend Jakub initiate Dys 1 (puree), pudding thick liquids (nothing thinner than puree/pudding), meds via PEG and hard throat clear intermittently. Continue ST while here and recommend continue at SNF.    SLP Visit Diagnosis Dysphagia, pharyngeal phase (R13.13) Attention and concentration deficit following -- Frontal lobe and executive function deficit following -- Impact on safety and function Moderate aspiration risk   CHL IP TREATMENT RECOMMENDATION 11/23/2019 Treatment Recommendations Therapy as outlined in treatment plan below   Prognosis 11/23/2019 Prognosis for Safe Diet Advancement Good Barriers to Reach Goals Severity of deficits Barriers/Prognosis Comment -- CHL IP DIET RECOMMENDATION 11/23/2019 SLP Diet Recommendations Dysphagia 1 (Puree) solids;Pudding thick liquid Liquid Administration via -- Medication Administration Via alternative means Compensations Small sips/bites;Slow rate;Clear throat intermittently Postural Changes Seated upright at 90 degrees   CHL IP OTHER RECOMMENDATIONS 11/23/2019 Recommended Consults -- Oral Care Recommendations Oral care BID Other Recommendations Order thickener from pharmacy   CHL IP FOLLOW UP RECOMMENDATIONS 11/23/2019 Follow up Recommendations Skilled Nursing facility   [facility] IP FREQUENCY AND DURATION 11/23/2019 Speech Therapy Frequency (ACUTE ONLY) min 2x/week Treatment Duration 2 weeks      CHL IP ORAL PHASE 11/23/2019 Oral Phase WFL Oral - Pudding Teaspoon -- Oral - Pudding Cup -- Oral - Honey Teaspoon NT Oral - Honey Cup -- Oral - Nectar Teaspoon NT Oral - Nectar Cup WFL Oral - Nectar Straw NT Oral - Thin Teaspoon NT Oral - Thin Cup NT Oral - Thin Straw -- Oral - Puree --  Oral - Mech Soft -- Oral - Regular -- Oral - Multi-Consistency -- Oral - Pill -- Oral Phase - Comment --  CHL IP PHARYNGEAL PHASE 11/23/2019 Pharyngeal Phase Impaired Pharyngeal- Pudding Teaspoon -- Pharyngeal -- Pharyngeal- Pudding Cup -- Pharyngeal -- Pharyngeal- Honey Teaspoon NT Pharyngeal -- Pharyngeal- Honey Cup Penetration/Aspiration during swallow;Pharyngeal residue - pyriform;Reduced laryngeal elevation;Reduced anterior laryngeal mobility Pharyngeal Material enters airway, remains ABOVE vocal cords and not ejected out;Material enters airway, passes BELOW cords without attempt by patient to eject out (silent aspiration);Material enters airway, passes BELOW cords and not ejected out despite cough attempt by patient Pharyngeal- Nectar Teaspoon NT Pharyngeal -- Pharyngeal- Nectar Cup Pharyngeal residue - pyriform;Penetration/Aspiration during swallow;Reduced anterior laryngeal mobility;Reduced laryngeal elevation Pharyngeal Material enters airway, passes BELOW cords without attempt by patient to eject out (silent aspiration) Pharyngeal- Nectar Straw -- Pharyngeal -- Pharyngeal- Thin Teaspoon NT Pharyngeal -- Pharyngeal- Thin Cup NT Pharyngeal -- Pharyngeal- Thin Straw -- Pharyngeal -- Pharyngeal- Puree Penetration/Aspiration during swallow Pharyngeal Material enters airway, remains ABOVE vocal cords and not ejected out Pharyngeal- Mechanical Soft -- Pharyngeal -- Pharyngeal- Regular WFL Pharyngeal -- Pharyngeal- Multi-consistency -- Pharyngeal -- Pharyngeal- Pill -- Pharyngeal -- Pharyngeal Comment --  CHL IP CERVICAL ESOPHAGEAL PHASE 11/23/2019 Cervical Esophageal Phase WFL Pudding Teaspoon -- Pudding Cup -- Honey Teaspoon -- Honey Cup -- Nectar Teaspoon -- Nectar Cup -- Nectar Straw -- Thin Teaspoon -- Thin Cup -- Thin Straw -- Puree -- Mechanical Soft -- Regular -- Multi-consistency -- Pill -- Cervical Esophageal Comment -- [name] 11/23/2019, 4:03 PM [name].Ed Community education officer [phone] Office [phone]                   Scheduled Meds:  apixaban  5 mg Per Tube BID   feeding supplement (PROSource TF)  45 mL Per Tube BID   free water  200 mL Per Tube Q6H   multivitamin  15 mL Per Tube Daily   sennosides  5 mL Per Tube QHS   sodium chloride flush  3 mL Intravenous Q12H   thiamine  100 mg Per Tube Daily   Continuous Infusions:  feeding supplement (OSMOLITE 1.5 CAL) 1,000 mL (11/23/19 2239)     LOS: 15 days     Jacquelin Hawking, MD [facility] 11/24/2019, 10:56 AM  If 7PM-7AM, please contact night-coverage [email or web address]

## 2019-11-24 NOTE — Progress Notes (Signed)
PT refuses to wear ATC.  I encouraged pt to use for humidity purposes.  Vital signs stable, sats 96% on RA.  Pt suctioned for thin clear to tan secretions.  Pt tolerated well.  RT will continue to monitor.

## 2019-11-25 LAB — GLUCOSE, CAPILLARY
Glucose-Capillary: 113 mg/dL — ABNORMAL HIGH (ref 70–99)
Glucose-Capillary: 142 mg/dL — ABNORMAL HIGH (ref 70–99)
Glucose-Capillary: 120 mg/dL — ABNORMAL HIGH (ref 70–99)
Glucose-Capillary: 93 mg/dL (ref 70–99)
Glucose-Capillary: 161 mg/dL — ABNORMAL HIGH (ref 70–99)
Glucose-Capillary: 113 mg/dL — ABNORMAL HIGH (ref 70–99)

## 2019-11-25 NOTE — Progress Notes (Signed)
PROGRESS NOTE    Marco Pittman  ZCH:[id number] DOB: [birth year] DOA: 11/09/2019 PCP: Marcine Matar, MD   Brief Narrative: Marco Pittman is a 59 y.o. male with history of hypertension, chronic pancreatitis, tobacco/alcohol abuse, primary biliary cirrhosis. Patient presented secondary to shortness of breath in the setting of glottic stenosis requiring emergent tracheostomy.   Assessment & Plan:   Active Problems:   Essential hypertension, benign   History of alcohol abuse   Acute respiratory failure with hypoxia (HCC)   Glottic stenosis   Dysphagia   Acute respiratory failure with hypoxia Respiratory distress Secondary to glottic stenosis. Patient underwent emergent tracheostomy on 8/11 by ENT. Currently stable on tracheostomy collar with O2 at 5 Lpm  Glottic stenosis S/p tracheostomy as mentioned above  History of pulmonary embolism Patient is on Eliquis as an outpatient -Continue Eliquis  Severe oropharyngeal dysphagia Patient followed by SLP. PEG tube placed by general surgery on 8/11 and patient has been tolerating tube feeds. Repeat MBS performed on 8/18 and was advanced to Dysphagia 1 diet -SLP recommendations:  Dysphagia 1 (Puree) solids;Pudding thick liquid Medication Administration: Via alternative means Compensations: Small sips/bites;Slow rate;Clear throat intermittently Postural Changes: Seated upright at 90 degrees Oral Care Recommendations: Oral care BID Other Recommendations: Order thickener from pharmacy  Essential hypertension On amlodipine and Coreg as an outpatient. Normotensive. Amlodipine and Coreg held. -Continue to hold home antihypertensives  Alcohol abuse Patient with no evidence of withdrawal. On thiamine as an outpatient -Continue thiamine  Normocytic anemia Mild and stable.  Chronic pancreatitis Appears stable. Asymptomatic.   DVT prophylaxis: Eliquis Code Status:   Code Status: Full Code Family Communication: None at  bedside Disposition Plan: Plan to discharge to SNF. Patient has a bed offer and is awaiting insurance authorization. Patient is medically stable for discharge.   Consultants:   ENT  PCCM  Procedures:   AWAKE TRACHEOSTOMY (11/09/2019)  EGD AND PEG TUBE PLACEMENT (11/16/2019)  Antimicrobials:  None    Subjective: No issues.  Objective: Vitals:   11/25/19 0313 11/25/19 0736 11/25/19 0756 11/25/19 1134  BP:  124/72    Pulse: 92 84 93 88  Resp: 18  18 16   Temp:  98.3 F (36.8 C)    TempSrc:      SpO2: 96%  95% 96%  Weight:      Height:        Intake/Output Summary (Last 24 hours) at 11/25/2019 1336 Last data filed at 11/25/2019 1134 Gross per 24 hour  Intake --  Output 900 ml  Net -900 ml   Filed Weights   11/19/19 0300 11/20/19 0353 11/21/19 0300  Weight: 73.7 kg 73.7 kg 73.7 kg    Examination:  General exam: Appears calm and comfortable Respiratory system: Rhonchi. Respiratory effort normal. Cardiovascular system: S1 & S2 heard, RRR. No murmurs, rubs, gallops or clicks. Gastrointestinal system: Abdomen is nondistended, soft and nontender. No organomegaly or masses felt. Normal bowel sounds heard. Central nervous system: Alert and oriented. No focal neurological deficits. Musculoskeletal: No edema. No calf tenderness Skin: No cyanosis. No rashes Psychiatry: Judgement and insight appear normal. Mood & affect appropriate.     Data Reviewed: I have personally reviewed following labs and imaging studies  CBC Lab Results  Component Value Date   WBC 6.5 11/21/2019   RBC 4.00 (L) 11/21/2019   RBC 3.99 (L) 11/21/2019   HGB 12.3 (L) 11/21/2019   HCT 35.5 (L) 11/21/2019   MCV 89.0 11/21/2019   MCH 30.8 11/21/2019  PLT 291 11/21/2019   MCHC 34.6 11/21/2019   RDW 12.2 11/21/2019   LYMPHSABS 1.5 11/09/2019   MONOABS 0.7 11/09/2019   EOSABS 0.1 11/09/2019   BASOSABS 0.0 11/09/2019     Last metabolic panel Lab Results  Component Value Date   NA 137  11/18/2019   K 4.0 11/18/2019   CL 103 11/18/2019   CO2 24 11/18/2019   BUN 9 11/18/2019   CREATININE 0.85 11/18/2019   GLUCOSE 141 (H) 11/18/2019   GFRNONAA >60 11/18/2019   GFRAA >60 11/18/2019   CALCIUM 8.8 (L) 11/18/2019   PHOS 3.9 11/18/2019   PROT 7.2 11/09/2019   ALBUMIN 2.4 (L) 11/18/2019   LABGLOB 3.8 10/18/2019   AGRATIO 1.2 10/18/2019   BILITOT 0.3 11/09/2019   ALKPHOS 70 11/09/2019   AST 20 11/09/2019   ALT 30 11/09/2019   ANIONGAP 10 11/18/2019    CBG (last 3)  Recent Labs    11/25/19 0407 11/25/19 0737 11/25/19 1212  GLUCAP 113* 142* 120*     GFR: Estimated Creatinine Clearance: 95.1 mL/min (by C-G formula based on SCr of 0.85 mg/dL).  Coagulation Profile: No results for input(s): INR, PROTIME in the last 168 hours.  No results found for this or any previous visit (from the past 240 hour(s)).      Radiology Studies: DG Swallowing Func-Speech Pathology  Result Date: 11/23/2019 Objective Swallowing Evaluation: Type of Study: MBS-Modified Barium Swallow Study  Patient Details Name: Marco Pittman MRN: [id number] Date of Birth: 04/08/1960 Today's Date: 11/23/2019 Time: SLP Start Time (ACUTE ONLY): 1448 -SLP Stop Time (ACUTE ONLY): 1506 SLP Time Calculation (min) (ACUTE ONLY): 18 min Past Medical History: Past Medical History: Diagnosis Date . Aneurysm (HCC)  . ETOH abuse  . Hypertension  . Pancreatitis 03/04/2016 . Umbilical hernia   symptomatic umbilical hernia/notes 03/04/2016 Past Surgical History: Past Surgical History: Procedure Laterality Date . GASTROSTOMY N/A 11/16/2019  Procedure: INSERTION OF GASTROSTOMY TUBE,;  Surgeon: Diamantina MonksLovick, Ayesha N, MD;  Location: [facility] OR;  Service: General;  Laterality: N/A; . INSERTION OF MESH N/A 04/14/2016  Procedure: INSERTION OF MESH;  Surgeon: Avel Peaceodd Rosenbower, MD;  Location: [facility];  Service: General;  Laterality: N/A; . MICROLARYNGOSCOPY N/A 11/09/2019  Procedure: MICROLARYNGOSCOPY;  Surgeon: Christia ReadingBates, Dwight,  MD;  [facility];  Service: ENT;  Laterality: N/A; . TRACHEOSTOMY TUBE PLACEMENT N/A 11/09/2019  Procedure: AWAKE TRACHEOSTOMY;  Surgeon: Christia ReadingBates, Dwight, MD;  [facility];  Service: ENT;  Laterality: N/A; . UMBILICAL HERNIA REPAIR N/A 04/14/2016  Procedure: UMBILICAL HERNIA REPAIR WITH MESH;  Surgeon: Avel Peaceodd Rosenbower, MD;  Location: [facility];  Service: General;  Laterality: N/A; HPI: 59 yo male smoker presented to ER 7/26 with worsening shortness of breath and chest discomfort. Found to have severe hypertension with SBP > 200.  CT chest showed b/l PE and RLL ASD with mucus plugging concerning for aspiration pneumonia. PMH: ETOH, HTN, Pancreatitis, Aneurysm. ETT 7/26-27; reintubated 7/27-29 due to angioedema. 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 Seen 11/03/19 post extubation with regular and nectar thick recommended and upgraded to thin liquids the following day.    Pt now with severe dysphagia noted on MBS testing x2.  His trach is now changed to a number 6 cuffless and thus he is able to move air around his trach and speak for a few seconds before breathstacking noted. Repeating MBS  prior to discharge tomorrow.  Subjective: pt awake in bed Assessment / Plan / Recommendation CHL IP CLINICAL IMPRESSIONS 11/23/2019 Clinical Impression Pt's swallow function has improved with decreased pharyngeal edema noted. Study performed with PMV doffed. Orally his control and propulsion was adequate and timely with minimal tongue base residue. Pt's anterior hyolaryngeal excursion and elevation appeared reduced but functional leaving minimal pyriform sinus residue. Laryngeal closure, however, was mistimed and pt aspirated honey thick from either pyriform sinus residue or interarytenoid residue without immediate sensation. Penetration present high in vestibule with puree.  Compensatory strategies using chin tuck and breath hold (isolated) was not an effective measure for pt to use with any consistency liquid. Pt did have a delayed cough to aspiration with barium expelled from trach. Recommend Caidyn initiate Dys 1 (puree), pudding thick liquids (nothing thinner than puree/pudding), meds via PEG and hard throat clear intermittently. Continue ST while here and recommend continue at SNF.    SLP Visit Diagnosis Dysphagia, pharyngeal phase (R13.13) Attention and concentration deficit following -- Frontal lobe and executive function deficit following -- Impact on safety and function Moderate aspiration risk   CHL IP TREATMENT RECOMMENDATION 11/23/2019 Treatment Recommendations Therapy as outlined in treatment plan below   Prognosis 11/23/2019 Prognosis for Safe Diet Advancement Good Barriers to Reach Goals Severity of deficits Barriers/Prognosis Comment -- CHL IP DIET RECOMMENDATION 11/23/2019 SLP Diet Recommendations Dysphagia 1 (Puree) solids;Pudding thick liquid Liquid Administration via -- Medication Administration Via alternative means Compensations Small sips/bites;Slow rate;Clear throat intermittently Postural Changes Seated upright at 90 degrees   CHL IP OTHER RECOMMENDATIONS 11/23/2019 Recommended Consults -- Oral Care Recommendations Oral care BID Other Recommendations Order thickener from pharmacy   CHL IP FOLLOW UP RECOMMENDATIONS 11/23/2019 Follow up Recommendations Skilled Nursing facility   [facility] IP FREQUENCY AND DURATION 11/23/2019 Speech Therapy Frequency (ACUTE ONLY) min 2x/week Treatment Duration 2 weeks      CHL IP ORAL PHASE 11/23/2019 Oral Phase WFL Oral - Pudding Teaspoon -- Oral - Pudding Cup -- Oral - Honey Teaspoon NT Oral - Honey Cup -- Oral - Nectar Teaspoon NT Oral - Nectar Cup WFL Oral - Nectar Straw NT Oral - Thin Teaspoon NT Oral - Thin Cup NT Oral - Thin Straw -- Oral - Puree -- Oral - Mech Soft -- Oral - Regular -- Oral - Multi-Consistency -- Oral - Pill -- Oral  Phase - Comment --  CHL IP PHARYNGEAL PHASE 11/23/2019 Pharyngeal Phase Impaired Pharyngeal- Pudding Teaspoon -- Pharyngeal -- Pharyngeal- Pudding Cup -- Pharyngeal -- Pharyngeal- Honey Teaspoon NT Pharyngeal -- Pharyngeal- Honey Cup Penetration/Aspiration during swallow;Pharyngeal residue - pyriform;Reduced laryngeal elevation;Reduced anterior laryngeal mobility Pharyngeal Material enters airway, remains ABOVE vocal cords and not ejected out;Material enters airway, passes BELOW cords without attempt by patient to eject out (silent aspiration);Material enters airway, passes BELOW cords and not ejected out despite cough attempt by patient Pharyngeal- Nectar Teaspoon NT Pharyngeal -- Pharyngeal- Nectar Cup Pharyngeal residue - pyriform;Penetration/Aspiration during swallow;Reduced anterior laryngeal mobility;Reduced laryngeal elevation Pharyngeal Material enters airway, passes BELOW cords without attempt by patient to eject out (silent aspiration) Pharyngeal- Nectar Straw -- Pharyngeal -- Pharyngeal- Thin Teaspoon NT Pharyngeal -- Pharyngeal- Thin Cup NT Pharyngeal -- Pharyngeal- Thin Straw -- Pharyngeal -- Pharyngeal- Puree Penetration/Aspiration during swallow Pharyngeal Material enters airway, remains ABOVE vocal cords and not ejected out Pharyngeal- Mechanical Soft -- Pharyngeal -- Pharyngeal- Regular WFL Pharyngeal -- Pharyngeal- Multi-consistency -- Pharyngeal -- Pharyngeal- Pill -- Pharyngeal -- Pharyngeal Comment --  CHL IP CERVICAL ESOPHAGEAL PHASE 11/23/2019 Cervical Esophageal  Phase WFL Pudding Teaspoon -- Pudding Cup -- Honey Teaspoon -- Honey Cup -- Nectar Teaspoon -- Nectar Cup -- Nectar Straw -- Thin Teaspoon -- Thin Cup -- Thin Straw -- Puree -- Mechanical Soft -- Regular -- Multi-consistency -- Pill -- Cervical Esophageal Comment -- [name] 11/23/2019, 4:03 PM [name] administrator Pager [phone] Office [phone]                   Scheduled  Meds: . apixaban  5 mg Per Tube BID  . feeding supplement (PROSource TF)  45 mL Per Tube BID  . free water  200 mL Per Tube Q6H  . multivitamin  15 mL Per Tube Daily  . sennosides  5 mL Per Tube QHS  . sodium chloride flush  3 mL Intravenous Q12H  . thiamine  100 mg Per Tube Daily   Continuous Infusions: . feeding supplement (OSMOLITE 1.5 CAL) 1,000 mL (11/24/19 1700)     LOS: 16 days     Jacquelin Hawking, MD [facility] 11/25/2019, 1:36 PM  If 7PM-7AM, please contact night-coverage [email or web address]

## 2019-11-25 NOTE — TOC Progression Note (Signed)
Transition of Care [facility]) - Progression Note    Patient Details  Name: Marco Pittman MRN: [id number] Date of Birth: March 03, 1961  Transition of Care [facility]) CM/SW Contact  Lorri Frederick, LCSW Phone Number: 11/25/2019, 1:25 PM  Clinical Narrative:   Multiple calls to [facility] to check on auth status with no response.  CSW updated pt.    Expected Discharge Plan: Home w Home Health Services Barriers to Discharge: Continued Medical Work up, English as a second language teacher, Equipment Delay  Expected Discharge Plan and Services Expected Discharge Plan: Home w Home Health Services     Post Acute Care Choice: Home Health Living arrangements for the past 2 months: Single Family Home Expected Discharge Date: 11/17/19                                     Social Determinants of Health (SDOH) Interventions    Readmission Risk Interventions No flowsheet data found.

## 2019-11-26 LAB — GLUCOSE, CAPILLARY
Glucose-Capillary: 107 mg/dL — ABNORMAL HIGH (ref 70–99)
Glucose-Capillary: 118 mg/dL — ABNORMAL HIGH (ref 70–99)
Glucose-Capillary: 125 mg/dL — ABNORMAL HIGH (ref 70–99)
Glucose-Capillary: 129 mg/dL — ABNORMAL HIGH (ref 70–99)
Glucose-Capillary: 139 mg/dL — ABNORMAL HIGH (ref 70–99)

## 2019-11-26 LAB — CBC
HCT: 38.6 % — ABNORMAL LOW (ref 39.0–52.0)
Hemoglobin: 13.4 g/dL (ref 13.0–17.0)
MCH: 30.8 pg (ref 26.0–34.0)
MCHC: 34.7 g/dL (ref 30.0–36.0)
MCV: 88.7 fL (ref 80.0–100.0)
Platelets: 343 10*3/uL (ref 150–400)
RBC: 4.35 MIL/uL (ref 4.22–5.81)
RDW: 12.3 % (ref 11.5–15.5)
WBC: 7.4 10*3/uL (ref 4.0–10.5)
nRBC: 0 % (ref 0.0–0.2)

## 2019-11-26 LAB — BASIC METABOLIC PANEL
Anion gap: 12 (ref 5–15)
BUN: 15 mg/dL (ref 8–23)
CO2: 23 mmol/L (ref 22–32)
Calcium: 9.1 mg/dL (ref 8.9–10.3)
Chloride: 101 mmol/L (ref 98–111)
Creatinine, Ser: 0.76 mg/dL (ref 0.61–1.24)
GFR calc Af Amer: >60 mL/min (ref >60)
GFR calc non Af Amer: >60 mL/min (ref >60)
Glucose, Bld: 114 mg/dL — ABNORMAL HIGH (ref 70–99)
Potassium: 4.6 mmol/L (ref 3.5–5.1)
Sodium: 136 mmol/L (ref 135–145)

## 2019-11-26 NOTE — TOC Progression Note (Signed)
Transition of Care [facility]) - Progression Note    Patient Details  Name: Marco Pittman MRN: [id number] Date of Birth: 1960-08-14  Transition of Care [facility]) CM/SW Contact  Jimmy Picket, [place] Phone Number: 11/26/2019, 9:51 AM  Clinical Narrative:     CSW spoke to [place] with [facility]. Silvio Pate explained they are unable to start auth or accept pt until Monday.   [facility] is pts only offer currently.  Expected Discharge Plan: Home w Home Health Services Barriers to Discharge: Continued Medical Work up, English as a second language teacher, Equipment Delay  Expected Discharge Plan and Services Expected Discharge Plan: Home w Home Health Services     Post Acute Care Choice: Home Health Living arrangements for the past 2 months: Single Family Home Expected Discharge Date: 11/17/19                                     Social Determinants of Health (SDOH) Interventions    Readmission Risk Interventions No flowsheet data found.  Jimmy Picket, Theresia Majors, [place] Clinical Social Worker [phone]

## 2019-11-26 NOTE — Plan of Care (Signed)

## 2019-11-26 NOTE — Progress Notes (Signed)
PROGRESS NOTE    Marco Pittman  XIP:[id number] DOB: 03-Apr-1961 DOA: 11/09/2019 PCP: Marcine Matar, MD   Brief Narrative: Marco Pittman is a 59 y.o. male with history of hypertension, chronic pancreatitis, tobacco/alcohol abuse, primary biliary cirrhosis. Patient presented secondary to shortness of breath in the setting of glottic stenosis requiring emergent tracheostomy.   Assessment & Plan:   Active Problems:   Essential hypertension, benign   History of alcohol abuse   Acute respiratory failure with hypoxia (HCC)   Glottic stenosis   Dysphagia   Acute respiratory failure with hypoxia Respiratory distress Secondary to glottic stenosis. Patient underwent emergent tracheostomy on 8/11 by ENT. Currently stable on tracheostomy collar with O2 at 5 Lpm  Glottic stenosis S/p tracheostomy as mentioned above  History of pulmonary embolism Patient is on Eliquis as an outpatient -Continue Eliquis  Severe oropharyngeal dysphagia Patient followed by SLP. PEG tube placed by general surgery on 8/11 and patient has been tolerating tube feeds. Repeat MBS performed on 8/18 and was advanced to Dysphagia 1 diet -Dietitian recommendations: -Continue via PEG: -Osmolite 1.5 cal @ 47ml/hr -14ml Prosource TF BID - free water Q6H -SLP recommendations:  Dysphagia 1 (Puree) solids;Pudding thick liquid Medication Administration: Via alternative means Compensations: Small sips/bites;Slow rate;Clear throat intermittently Postural Changes: Seated upright at 90 degrees Oral Care Recommendations: Oral care BID Other Recommendations: Order thickener from pharmacy  Essential hypertension On amlodipine and Coreg as an outpatient. Normotensive. Amlodipine and Coreg held. -Continue to hold home antihypertensives  Alcohol abuse Patient with no evidence of withdrawal. On thiamine as an outpatient -Continue thiamine  Normocytic anemia Mild and stable.  Chronic pancreatitis Appears  stable. Asymptomatic.   DVT prophylaxis: Eliquis Code Status:   Code Status: Full Code Family Communication: None at bedside Disposition Plan: Plan to discharge to SNF. Patient has a bed offer and is awaiting insurance authorization. Patient is medically stable for discharge.   Consultants:   ENT  PCCM  Procedures:   AWAKE TRACHEOSTOMY (11/09/2019)  EGD AND PEG TUBE PLACEMENT (11/16/2019)  Antimicrobials:  None    Subjective: No concerns. Patient states he is having bowel movements  Objective: Vitals:   11/26/19 0316 11/26/19 0802 11/26/19 0805 11/26/19 1040  BP:   104/79   Pulse: 92  95   Resp: 18 18 18 20   Temp:   98.4 F (36.9 C)   TempSrc:   Oral   SpO2: 98%  99% 98%  Weight:      Height:        Intake/Output Summary (Last 24 hours) at 11/26/2019 1059 Last data filed at 11/26/2019 0100 Gross per 24 hour  Intake 650 ml  Output 2000 ml  Net -1350 ml   Filed Weights   11/19/19 0300 11/20/19 0353 11/21/19 0300  Weight: 73.7 kg 73.7 kg 73.7 kg    Examination:  General exam: Appears calm and comfortable Respiratory system: Rhonchi bilaterally. Respiratory effort normal. Cardiovascular system: S1 & S2 heard, RRR. No murmurs, rubs, gallops or clicks. Gastrointestinal system: Abdomen is distended, soft and nontender. No organomegaly or masses felt. Normal bowel sounds heard. Central nervous system: Alert and oriented. No focal neurological deficits. Musculoskeletal: No edema. No calf tenderness Skin: No cyanosis. No rashes Psychiatry: Judgement and insight appear normal. Mood & affect appropriate.      Data Reviewed: I have personally reviewed following labs and imaging studies  CBC Lab Results  Component Value Date   WBC 6.5 11/21/2019   RBC 4.00 (L) 11/21/2019  RBC 3.99 (L) 11/21/2019   HGB 12.3 (L) 11/21/2019   HCT 35.5 (L) 11/21/2019   MCV 89.0 11/21/2019   MCH 30.8 11/21/2019   PLT 291 11/21/2019   MCHC 34.6 11/21/2019   RDW 12.2  11/21/2019   LYMPHSABS 1.5 11/09/2019   MONOABS 0.7 11/09/2019   EOSABS 0.1 11/09/2019   BASOSABS 0.0 11/09/2019     Last metabolic panel Lab Results  Component Value Date   NA 137 11/18/2019   K 4.0 11/18/2019   CL 103 11/18/2019   CO2 24 11/18/2019   BUN 9 11/18/2019   CREATININE 0.85 11/18/2019   GLUCOSE 141 (H) 11/18/2019   GFRNONAA >60 11/18/2019   GFRAA >60 11/18/2019   CALCIUM 8.8 (L) 11/18/2019   PHOS 3.9 11/18/2019   PROT 7.2 11/09/2019   ALBUMIN 2.4 (L) 11/18/2019   LABGLOB 3.8 10/18/2019   AGRATIO 1.2 10/18/2019   BILITOT 0.3 11/09/2019   ALKPHOS 70 11/09/2019   AST 20 11/09/2019   ALT 30 11/09/2019   ANIONGAP 10 11/18/2019    CBG (last 3)  Recent Labs    11/25/19 2305 11/26/19 0316 11/26/19 0806  GLUCAP 113* 125* 118*     GFR: Estimated Creatinine Clearance: 95.1 mL/min (by C-G formula based on SCr of 0.85 mg/dL).  Coagulation Profile: No results for input(s): INR, PROTIME in the last 168 hours.  No results found for this or any previous visit (from the past 240 hour(s)).      Radiology Studies: No results found.      Scheduled Meds: . apixaban  5 mg Per Tube BID  . feeding supplement (PROSource TF)  45 mL Per Tube BID  . free water  200 mL Per Tube Q6H  . multivitamin  15 mL Per Tube Daily  . sennosides  5 mL Per Tube QHS  . sodium chloride flush  3 mL Intravenous Q12H  . thiamine  100 mg Per Tube Daily   Continuous Infusions: . feeding supplement (OSMOLITE 1.5 CAL) 1,000 mL (11/24/19 1700)     LOS: 17 days     Jacquelin Hawking, MD [facility] 11/26/2019, 10:59 AM  If 7PM-7AM, please contact night-coverage [email or web address]

## 2019-11-27 LAB — GLUCOSE, CAPILLARY
Glucose-Capillary: 162 mg/dL — ABNORMAL HIGH (ref 70–99)
Glucose-Capillary: 107 mg/dL — ABNORMAL HIGH (ref 70–99)
Glucose-Capillary: 116 mg/dL — ABNORMAL HIGH (ref 70–99)
Glucose-Capillary: 149 mg/dL — ABNORMAL HIGH (ref 70–99)
Glucose-Capillary: 137 mg/dL — ABNORMAL HIGH (ref 70–99)
Glucose-Capillary: 120 mg/dL — ABNORMAL HIGH (ref 70–99)

## 2019-11-27 MED ORDER — GUAIFENESIN 100 MG/5ML PO SOLN: 10.0000 mL | ORAL | Status: DC | PRN

## 2019-11-27 NOTE — Progress Notes (Signed)
PROGRESS NOTE    TANIELA FELTUS  AYT:[id number] DOB: Apr 03, 1961 DOA: 11/09/2019 PCP: Marcine Matar, MD   Brief Narrative: Marco Pittman is a 59 y.o. male with history of hypertension, chronic pancreatitis, tobacco/alcohol abuse, primary biliary cirrhosis. Patient presented secondary to shortness of breath in the setting of glottic stenosis requiring emergent tracheostomy.   Assessment & Plan:   Active Problems:   Essential hypertension, benign   History of alcohol abuse   Acute respiratory failure with hypoxia (HCC)   Glottic stenosis   Dysphagia   Acute respiratory failure with hypoxia Respiratory distress Secondary to glottic stenosis. Patient underwent emergent tracheostomy on 8/11 by ENT. Currently stable on tracheostomy collar with O2 at 5 Lpm -Guaifenesin  Glottic stenosis S/p tracheostomy as mentioned above  History of pulmonary embolism Patient is on Eliquis as an outpatient -Continue Eliquis  Severe oropharyngeal dysphagia Patient followed by SLP. PEG tube placed by general surgery on 8/11 and patient has been tolerating tube feeds. Repeat MBS performed on 8/18 and was advanced to Dysphagia 1 diet -Dietitian recommendations: -Continue via PEG: -Osmolite 1.5 cal @ 47ml/hr -28ml Prosource TF BID - free water Q6H -SLP recommendations:  Dysphagia 1 (Puree) solids;Pudding thick liquid Medication Administration: Via alternative means Compensations: Small sips/bites;Slow rate;Clear throat intermittently Postural Changes: Seated upright at 90 degrees Oral Care Recommendations: Oral care BID Other Recommendations: Order thickener from pharmacy  Essential hypertension On amlodipine and Coreg as an outpatient. Normotensive. Amlodipine and Coreg held. -Continue to hold home antihypertensives  Alcohol abuse Patient with no evidence of withdrawal. On thiamine as an outpatient -Continue thiamine  Normocytic anemia Mild and stable.  Chronic  pancreatitis Appears stable. Asymptomatic.   DVT prophylaxis: Eliquis Code Status:   Code Status: Full Code Family Communication: None at bedside Disposition Plan: Plan to discharge to SNF. Patient has a bed offer and is awaiting insurance authorization. Patient is medically stable for discharge.   Consultants:   ENT  PCCM  Procedures:   AWAKE TRACHEOSTOMY (11/09/2019)  EGD AND PEG TUBE PLACEMENT (11/16/2019)  Antimicrobials:  None    Subjective: No issues.  Objective: Vitals:   11/27/19 0442 11/27/19 0712 11/27/19 0738 11/27/19 1031  BP: 101/62  117/76   Pulse: 95 92 94   Resp: 15 18 17 18   Temp: 98.3 F (36.8 C)  98.2 F (36.8 C)   TempSrc:   Oral   SpO2: 98% 98% 95%   Weight:      Height:        Intake/Output Summary (Last 24 hours) at 11/27/2019 1039 Last data filed at 11/27/2019 0900 Gross per 24 hour  Intake 1460 ml  Output 2300 ml  Net -840 ml   Filed Weights   11/19/19 0300 11/20/19 0353 11/21/19 0300  Weight: 73.7 kg 73.7 kg 73.7 kg    Examination:  General exam: Appears calm and comfortable Respiratory system: Bilateral rhonchi. Respiratory effort normal. Cardiovascular system: S1 & S2 heard, RRR. No murmurs, rubs, gallops or clicks. Gastrointestinal system: Abdomen is nondistended, soft and nontender. No organomegaly or masses felt. Normal bowel sounds heard. Central nervous system: Alert and oriented. No focal neurological deficits. Musculoskeletal: No edema. No calf tenderness Skin: No cyanosis. No rashes Psychiatry: Judgement and insight appear normal. Mood & affect appropriate.       Data Reviewed: I have personally reviewed following labs and imaging studies  CBC Lab Results  Component Value Date   WBC 7.4 11/26/2019   RBC 4.35 11/26/2019   HGB 13.4 11/26/2019  HCT 38.6 (L) 11/26/2019   MCV 88.7 11/26/2019   MCH 30.8 11/26/2019   PLT 343 11/26/2019   MCHC 34.7 11/26/2019   RDW 12.3 11/26/2019   LYMPHSABS 1.5 11/09/2019    MONOABS 0.7 11/09/2019   EOSABS 0.1 11/09/2019   BASOSABS 0.0 11/09/2019     Last metabolic panel Lab Results  Component Value Date   NA 136 11/26/2019   K 4.6 11/26/2019   CL 101 11/26/2019   CO2 23 11/26/2019   BUN 15 11/26/2019   CREATININE 0.76 11/26/2019   GLUCOSE 114 (H) 11/26/2019   GFRNONAA >60 11/26/2019   GFRAA >60 11/26/2019   CALCIUM 9.1 11/26/2019   PHOS 3.9 11/18/2019   PROT 7.2 11/09/2019   ALBUMIN 2.4 (L) 11/18/2019   LABGLOB 3.8 10/18/2019   AGRATIO 1.2 10/18/2019   BILITOT 0.3 11/09/2019   ALKPHOS 70 11/09/2019   AST 20 11/09/2019   ALT 30 11/09/2019   ANIONGAP 12 11/26/2019    CBG (last 3)  Recent Labs    11/26/19 2350 11/27/19 0441 11/27/19 0740  GLUCAP 129* 162* 107*     GFR: Estimated Creatinine Clearance: 101.1 mL/min (by C-G formula based on SCr of 0.76 mg/dL).  Coagulation Profile: No results for input(s): INR, PROTIME in the last 168 hours.  No results found for this or any previous visit (from the past 240 hour(s)).      Radiology Studies: No results found.      Scheduled Meds: . apixaban  5 mg Per Tube BID  . feeding supplement (PROSource TF)  45 mL Per Tube BID  . free water  200 mL Per Tube Q6H  . multivitamin  15 mL Per Tube Daily  . sennosides  5 mL Per Tube QHS  . sodium chloride flush  3 mL Intravenous Q12H  . thiamine  100 mg Per Tube Daily   Continuous Infusions: . feeding supplement (OSMOLITE 1.5 CAL) 1,000 mL (11/26/19 1319)     LOS: 18 days     Jacquelin Hawking, MD [facility] 11/27/2019, 10:39 AM  If 7PM-7AM, please contact night-coverage [email or web address]

## 2019-11-28 LAB — GLUCOSE, CAPILLARY
Glucose-Capillary: 106 mg/dL — ABNORMAL HIGH (ref 70–99)
Glucose-Capillary: 119 mg/dL — ABNORMAL HIGH (ref 70–99)
Glucose-Capillary: 95 mg/dL (ref 70–99)
Glucose-Capillary: 122 mg/dL — ABNORMAL HIGH (ref 70–99)
Glucose-Capillary: 110 mg/dL — ABNORMAL HIGH (ref 70–99)
Glucose-Capillary: 111 mg/dL — ABNORMAL HIGH (ref 70–99)

## 2019-11-28 NOTE — Progress Notes (Signed)
  Speech Language Pathology Treatment: Dysphagia;Passy Muir Speaking valve  Patient Details Name: Marco Pittman MRN: [id number] DOB: [birth year] Today's Date: 11/28/2019 Time: 3570-1779 SLP Time Calculation (min) (ACUTE ONLY): 25 min  Assessment / Plan / Recommendation Clinical Impression  Pt seen at bedside for skilled ST intervention targeting goals for PMSV care/use and diet tolerance/advancement. Pt now tolerates PMSV valve well, and is able to don/doff independently. Voice quality is hoarse, but speech is intelligible. No air stacking noted upon removal of valve. Pt reports tolerance of puree textures, and has been eating ice chips. Pt was observed drinking water, and passed the 3oz water challenge without overt s/s aspiration (cough response delayed, but elicited on 11/23/19 MBS following aspiration event).   Recommend advancing solids to dys 2, and advancing liquids to thin based on clinical presentation and improvement. SLP will continue to follow to assess diet tolerance. Goals updated.    HPI HPI: 59 yo male smoker presented to ER 7/26 with worsening shortness of breath and chest discomfort. Found to have severe hypertension with SBP > 200.  CT chest showed b/l PE and RLL ASD with mucus plugging concerning for aspiration pneumonia. PMH: ETOH, HTN, Pancreatitis, Aneurysm. ETT 7/26-27; reintubated 7/27-29 due to angioedema. 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 Seen 11/03/19 post extubation with regular and nectar thick recommended and upgraded to thin liquids the following day.    Pt now with severe dysphagia noted on MBS testing x2.  His trach is now changed to a number 6 cuffless and thus he is able to move air around his trach and speak for a few seconds before breathstacking noted. MBS 8/18 recommended puree and pudding thick liquids.       SLP  Plan  Continue with current plan of care       Recommendations  Diet recommendations: Dysphagia 2 (fine chop);Thin liquid Liquids provided via: Straw;Cup Medication Administration: Crushed with puree or via PEG tube Supervision: Patient able to self feed Compensations: Small sips/bites;Slow rate Postural Changes and/or Swallow Maneuvers: Seated upright 90 degrees      Patient may use Passy-Muir Speech Valve: During all waking hours (remove during sleep);During all therapies with supervision;During PO intake/meals PMSV Supervision: Intermittent         Oral Care Recommendations: Oral care BID Follow up Recommendations: Skilled Nursing facility SLP Visit Diagnosis: Dysphagia, pharyngeal phase (R13.13) Plan: Continue with current plan of care       GO               Maresa Morash B. Murvin Natal, [facility], CCC-SLP Speech Language Pathologist Office: [phone] Pager: [phone]  Leigh Aurora 11/28/2019, 11:55 AM

## 2019-11-28 NOTE — TOC Progression Note (Signed)
Transition of Care [facility]) - Progression Note    Patient Details  Name: Marco Pittman MRN: [id number] Date of Birth: [birth year]  Transition of Care [facility]) CM/SW Contact  Lorri Frederick, LCSW Phone Number: 11/28/2019, 3:00 PM  Clinical Narrative:   CSW spoke to [place] at [facility] are having call bell issues and the state will not allow new admissions, could be resolved in a few days, however, Velna [facility]chet said she cannot accept until trach has been in place 30 days anyways.    Expected Discharge Plan: Home w Home Health Services Barriers to Discharge: Continued Medical Work up, English as a second language teacher, Equipment Delay  Expected Discharge Plan and Services Expected Discharge Plan: Home w Home Health Services     Post Acute Care Choice: Home Health Living arrangements for the past 2 months: Single Family Home Expected Discharge Date: 11/17/19                                     Social Determinants of Health (SDOH) Interventions    Readmission Risk Interventions No flowsheet data found.

## 2019-11-28 NOTE — Plan of Care (Signed)

## 2019-11-28 NOTE — Progress Notes (Signed)
NAME:  Marco Pittman, MRN:  [id number], DOB:  1961-01-30, LOS: 19 ADMISSION DATE:  11/09/2019, CONSULTATION DATE:  11/09/2019 REFERRING MD:  Dr. Pilar Plate, CHIEF COMPLAINT:  Subglottic stenosis    Brief History   59yo male with a PMH significant for hypertension, pancreatitis, ETOH abuse, aneurysm, angioedema, and PE who presented 8/4 with chief complaint of shortness of breath. Per patient the shortness of breath coupled with stridor began 2 days prior to admission and has progressively worsened.   On arrival patient was seen in respiratory distress with tachypnea and stridor. Initial labwork was unremarkable. Given presentation ENT was consulted in ED and preformed bedside fiberoptic exam that revealed imobile vocal cords with only slit glottic space for breathing therefore decision was made to take patient for emergent awake tracheostomy per ENT.   Of note patient has underwent multiple intubations and extubations over the last few weeks as follows: Admitted for angioedema requiring intubation 7/15-7/18, discharged 7/21 Readmitted 7/26 with SOB 2/2 aspiration PNA and PE intubated 7/26-7/27 Developed upper airway wheeze and required reintubation 7/27-7/29   Past Medical History  ETHO abuse Pancreatitis Hypertension PE Angioedema   Significant Hospital Events   8/04 Admit, underwent emergent awake trach for sub glottic stenosis  8/05 ATC 35%, SLP eval pending  8/09 MBS > NPO, Ice chips PRN   Consults:  ENT  Procedures:  Trach (awake, per ENT) 8/4 >>   Significant Diagnostic Tests:  DG soft neck 8/4 > negative  CXR 8/4 > negative  MBS 8/9 >> NPO, Ice chips PRN  Micro Data:  COVID 8/4 >> negative   Antimicrobials:     Interim history/subjective:   No complaints since Pulmonary's last visit with patient. Tolerating PMV and able to self-care for trach. Continues to have productive cough with thick sputum.  Objective   Blood pressure (!) 122/97, pulse 92, temperature 98.6 F  (37 C), resp. rate 18, height 6\' 1"  (1.854 m), weight 73.7 kg, SpO2 92 %.    FiO2 (%):  [21 %] 21 %   Intake/Output Summary (Last 24 hours) at 11/28/2019 1030 Last data filed at 11/28/2019 0452 Gross per 24 hour  Intake 500 ml  Output 2370 ml  Net -1870 ml   Filed Weights   11/19/19 0300 11/20/19 0353 11/21/19 0300  Weight: 73.7 kg 73.7 kg 73.7 kg   Physical Exam: General: Thin, well-appearing, no acute distress HENT: [place], AT, OP clear, MMM Neck: #6 Shiley cuffless trach in place with PMV Eyes: EOMI, no scleral icterus Respiratory: Upper airway rhonchi, clear to auscultation bilaterally.  No crackles, wheezing or rales. Strong cough. Cardiovascular: RRR, -M/R/G, no JVD GI: BS+, soft, nontender Extremities:-Edema,-tenderness Neuro: AAO x4, CNII-XII grossly intact Skin: Intact, no rashes or bruising Psych: Normal mood, normal affect  Resolved Hospital Problem list     Assessment & Plan:   Subglottic stenosis  Acute Respiratory Insuffiency secondary to Subglottic Stenosis  Tracheostomy Status  In the setting of multiple intubations and extubations over the last few weeks. S/P emergent tracheostomy per ENT.  -Continue routine trach care -Continue aspiration precautions -Continue work with SLP  Home Trach / Discharge Needs: -DME ordered for discharge will need to include: 6.  Shiley cuffless trach with a backup #4 cuffless trach, suction, trach supplies to include gauze and cleaning supplies as well as an additional PMV. He will also need outpatient SLP follow-up. And patient can follow-up in the trach clinic with 11/23/19, ACNP [phone].  Recent PE -Anticoagulation per primary.  Hx  of Hypertension Home medications include Coreg, and norvasc  HX of ETOH abuse  -Per primary  At Risk Malnutrition  -Dysphagia diet  Communication sent to Dr. Caleb Popp. Patient awaiting placement. Per recent CW note, [facility] will start authorization process  today  Pulmonary will continue to follow once weekly or as needed for trach care needs.  Do not hesitate to call with any questions or concerns.  Best practice:  Diet: Dysphagia diet per primary Pain/Anxiety/Delirium protocol (if indicated): n/a VAP protocol (if indicated): n/a DVT prophylaxis: lovenox until able to take po GI prophylaxis: PPI Glucose control: SSI Mobility: as tolerated   Code Status: Full Family Communication: Patient updated 8/23 Disposition: per [facility], PCCM will continue to follow for trach care.   Labs   CBC: Recent Labs  Lab 11/26/19 1127  WBC 7.4  HGB 13.4  HCT 38.6*  MCV 88.7  PLT 343    Basic Metabolic Panel: Recent Labs  Lab 11/26/19 1127  NA 136  K 4.6  CL 101  CO2 23  GLUCOSE 114*  BUN 15  CREATININE 0.76  CALCIUM 9.1   GFR: Estimated Creatinine Clearance: 101.1 mL/min (by C-G formula based on SCr of 0.76 mg/dL). Recent Labs  Lab 11/26/19 1127  WBC 7.4    Liver Function Tests: No results for input(s): AST, ALT, ALKPHOS, BILITOT, PROT, ALBUMIN in the last 168 hours. No results for input(s): LIPASE, AMYLASE in the last 168 hours. No results for input(s): AMMONIA in the last 168 hours.  ABG    Component Value Date/Time   PHART 7.379 11/01/2019 1337   PCO2ART 50.2 (H) 11/01/2019 1337   PO2ART 309 (H) 11/01/2019 1337   HCO3 29.8 (H) 11/01/2019 1337   TCO2 30 11/09/2019 1551   O2SAT 100.0 11/01/2019 1337     Coagulation Profile: No results for input(s): INR, PROTIME in the last 168 hours.  Cardiac Enzymes: No results for input(s): CKTOTAL, CKMB, CKMBINDEX, TROPONINI in the last 168 hours.  HbA1C: Hemoglobin A1C  Date/Time Value Ref Range Status  05/05/2018 09:12 AM 5.9 (A) 4.0 - 5.6 % Final  10/10/2016 09:20 AM 6.1  Final   Hgb A1c MFr Bld  Date/Time Value Ref Range Status  10/18/2019 03:09 PM 5.9 (H) 4.8 - 5.6 % Final    Comment:             Prediabetes: 5.7 - 6.4          Diabetes: >6.4          Glycemic control  for adults with diabetes: <7.0   03/05/2016 05:43 AM 5.9 (H) 4.8 - 5.6 % Final    Comment:    (NOTE)         Pre-diabetes: 5.7 - 6.4         Diabetes: >6.4         Glycemic control for adults with diabetes: <7.0     CBG: Recent Labs  Lab 11/27/19 1619 11/27/19 1915 11/27/19 2303 11/28/19 0306 11/28/19 0727  GLUCAP 149* 137* 120* 106* 119*        Mechele Collin, M.D. [facility] Pulmonary/Critical Care Medicine 11/28/2019 10:31 AM

## 2019-11-28 NOTE — Progress Notes (Addendum)
PROGRESS NOTE    Marco Pittman  EVO:[id number] DOB: 05-25-1960 DOA: 11/09/2019 PCP: Marcine Matar, MD   Brief Narrative: Marco Pittman is a 59 y.o. male with history of hypertension, chronic pancreatitis, tobacco/alcohol abuse, primary biliary cirrhosis. Patient presented secondary to shortness of breath in the setting of glottic stenosis requiring emergent tracheostomy.   Assessment & Plan:   Active Problems:   Essential hypertension, benign   History of alcohol abuse   Acute respiratory failure with hypoxia (HCC)   Glottic stenosis   Dysphagia   Acute respiratory failure with hypoxia Respiratory distress Secondary to glottic stenosis. Patient underwent emergent tracheostomy on 8/11 by ENT. Currently stable on tracheostomy collar with O2 at 5 Lpm. Secretions improved. -Guaifenesin per tube  Glottic stenosis S/p tracheostomy as mentioned above Pulmonology recommendations: Home Trach / Discharge Needs: -DME ordered for discharge will need to include: 6.  Shiley cuffless trach with a backup #4 cuffless trach, suction, trach supplies to include gauze and cleaning supplies as well as an additional PMV. He will also need outpatient SLP follow-up. And patient can follow-up in the trach clinic with [name], ACNP [phone].  History of pulmonary embolism Patient is on Eliquis as an outpatient -Continue Eliquis  Severe oropharyngeal dysphagia Patient followed by SLP. PEG tube placed by general surgery on 8/11 and patient has been tolerating tube feeds. Repeat MBS performed on 8/18 and was advanced to Dysphagia 1 diet -Dietitian recommendations: -Continue via PEG: -Osmolite 1.5 cal @ 55ml/hr -71ml Prosource TF BID - free water Q6H -SLP recommendations:  Dysphagia 1 (Puree) solids;Pudding thick liquid Medication Administration: Via alternative means Compensations: Small sips/bites;Slow rate;Clear throat intermittently Postural Changes: Seated  upright at 90 degrees Oral Care Recommendations: Oral care BID Other Recommendations: Order thickener from pharmacy  Essential hypertension On amlodipine and Coreg as an outpatient. Normotensive. Amlodipine and Coreg held. -Continue to hold home antihypertensives  Alcohol abuse Patient with no evidence of withdrawal. On thiamine as an outpatient -Continue thiamine  Normocytic anemia Mild and stable.  Chronic pancreatitis Appears stable. Asymptomatic.   DVT prophylaxis: Eliquis Code Status:   Code Status: Full Code Family Communication: None at bedside Disposition Plan: Plan to discharge to SNF. Patient has a bed offer and is awaiting insurance authorization. Patient is medically stable for discharge.   Consultants:   ENT  PCCM  Procedures:   AWAKE TRACHEOSTOMY (11/09/2019)  EGD AND PEG TUBE PLACEMENT (11/16/2019)  Antimicrobials:  None    Subjective: No chest pain or dyspnea. Productive coughing improved  Objective: Vitals:   11/27/19 1910 11/27/19 2318 11/28/19 0339 11/28/19 0436  BP:    (!) 139/119  Pulse: 92 88 90 84  Resp: 18 18 18    Temp:    98.5 F (36.9 C)  TempSrc:    Oral  SpO2: 95% 97% 98% 99%  Weight:      Height:        Intake/Output Summary (Last 24 hours) at 11/28/2019 0718 Last data filed at 11/28/2019 0452 Gross per 24 hour  Intake 700 ml  Output 2370 ml  Net -1670 ml   Filed Weights   11/19/19 0300 11/20/19 0353 11/21/19 0300  Weight: 73.7 kg 73.7 kg 73.7 kg    Examination:  General exam: Appears calm and comfortable Respiratory system: Clear to auscultation. Respiratory effort normal. Cardiovascular system: S1 & S2 heard, RRR. No murmurs, rubs, gallops or clicks. Gastrointestinal system: Abdomen is nondistended, soft and nontender. No organomegaly or masses felt. Normal bowel sounds heard. Central  nervous system: Alert and oriented. No focal neurological deficits. Musculoskeletal: No edema. No calf tenderness Skin: No  cyanosis. No rashes Psychiatry: Judgement and insight appear normal. Mood & affect appropriate.      Data Reviewed: I have personally reviewed following labs and imaging studies  CBC Lab Results  Component Value Date   WBC 7.4 11/26/2019   RBC 4.35 11/26/2019   HGB 13.4 11/26/2019   HCT 38.6 (L) 11/26/2019   MCV 88.7 11/26/2019   MCH 30.8 11/26/2019   PLT 343 11/26/2019   MCHC 34.7 11/26/2019   RDW 12.3 11/26/2019   LYMPHSABS 1.5 11/09/2019   MONOABS 0.7 11/09/2019   EOSABS 0.1 11/09/2019   BASOSABS 0.0 11/09/2019     Last metabolic panel Lab Results  Component Value Date   NA 136 11/26/2019   K 4.6 11/26/2019   CL 101 11/26/2019   CO2 23 11/26/2019   BUN 15 11/26/2019   CREATININE 0.76 11/26/2019   GLUCOSE 114 (H) 11/26/2019   GFRNONAA >60 11/26/2019   GFRAA >60 11/26/2019   CALCIUM 9.1 11/26/2019   PHOS 3.9 11/18/2019   PROT 7.2 11/09/2019   ALBUMIN 2.4 (L) 11/18/2019   LABGLOB 3.8 10/18/2019   AGRATIO 1.2 10/18/2019   BILITOT 0.3 11/09/2019   ALKPHOS 70 11/09/2019   AST 20 11/09/2019   ALT 30 11/09/2019   ANIONGAP 12 11/26/2019    CBG (last 3)  Recent Labs    11/27/19 1915 11/27/19 2303 11/28/19 0306  GLUCAP 137* 120* 106*     GFR: Estimated Creatinine Clearance: 101.1 mL/min (by C-G formula based on SCr of 0.76 mg/dL).  Coagulation Profile: No results for input(s): INR, PROTIME in the last 168 hours.  No results found for this or any previous visit (from the past 240 hour(s)).      Radiology Studies: No results found.      Scheduled Meds: . apixaban  5 mg Per Tube BID  . feeding supplement (PROSource TF)  45 mL Per Tube BID  . free water  200 mL Per Tube Q6H  . multivitamin  15 mL Per Tube Daily  . sennosides  5 mL Per Tube QHS  . sodium chloride flush  3 mL Intravenous Q12H  . thiamine  100 mg Per Tube Daily   Continuous Infusions: . feeding supplement (OSMOLITE 1.5 CAL) 1,000 mL (11/28/19 0536)     LOS: 19 days      Jacquelin Hawking, MD [facility] 11/28/2019, 7:18 AM  If 7PM-7AM, please contact night-coverage [email or web address]

## 2019-11-29 LAB — GLUCOSE, CAPILLARY
Glucose-Capillary: 106 mg/dL — ABNORMAL HIGH (ref 70–99)
Glucose-Capillary: 110 mg/dL — ABNORMAL HIGH (ref 70–99)
Glucose-Capillary: 118 mg/dL — ABNORMAL HIGH (ref 70–99)
Glucose-Capillary: 128 mg/dL — ABNORMAL HIGH (ref 70–99)
Glucose-Capillary: 131 mg/dL — ABNORMAL HIGH (ref 70–99)
Glucose-Capillary: 98 mg/dL (ref 70–99)

## 2019-11-29 NOTE — Progress Notes (Signed)
PROGRESS NOTE    Marco Pittman  OAC:[id number] DOB: 15-Sep-1960 DOA: 11/09/2019 PCP: Marcine Matar, MD   Brief Narrative: Marco Pittman is a 59 y.o. male with history of hypertension, chronic pancreatitis, tobacco/alcohol abuse, primary biliary cirrhosis. Patient presented secondary to shortness of breath in the setting of glottic stenosis requiring emergent tracheostomy.   Assessment & Plan:   Active Problems:   Essential hypertension, benign   History of alcohol abuse   Acute respiratory failure with hypoxia (HCC)   Glottic stenosis   Dysphagia   Acute respiratory failure with hypoxia Respiratory distress Secondary to glottic stenosis. Patient underwent emergent tracheostomy on 8/11 by ENT. Currently stable on tracheostomy collar with O2 at 5 Lpm. Secretions improved. -Guaifenesin per tube  Glottic stenosis S/p tracheostomy as mentioned above Pulmonology recommendations: Home Trach / Discharge Needs: -DME ordered for discharge will need to include: 6.  Shiley cuffless trach with a backup #4 cuffless trach, suction, trach supplies to include gauze and cleaning supplies as well as an additional PMV. He will also need outpatient SLP follow-up. And patient can follow-up in the trach clinic with [name], ACNP [phone].  History of pulmonary embolism Patient is on Eliquis as an outpatient -Continue Eliquis  Severe oropharyngeal dysphagia Patient followed by SLP. PEG tube placed by general surgery on 8/11 and patient has been tolerating tube feeds. Repeat MBS performed on 8/18 and was advanced to Dysphagia 1 diet -Dietitian recommendations: -Continue via PEG: -Osmolite 1.5 cal @ 64ml/hr -72ml Prosource TF BID - free water Q6H -SLP recommendations:  Dysphagia 1 (Puree) solids;Pudding thick liquid Medication Administration: Via alternative means Compensations: Small sips/bites;Slow rate;Clear throat intermittently Postural Changes: Seated  upright at 90 degrees Oral Care Recommendations: Oral care BID Other Recommendations: Order thickener from pharmacy  Essential hypertension On amlodipine and Coreg as an outpatient. Normotensive. Amlodipine and Coreg held. -Continue to hold home antihypertensives  Alcohol abuse Patient with no evidence of withdrawal. On thiamine as an outpatient -Continue thiamine  Normocytic anemia Mild and stable.  Chronic pancreatitis Appears stable. Asymptomatic.   DVT prophylaxis: Eliquis Code Status:   Code Status: Full Code Family Communication: None at bedside Disposition Plan: Plan to discharge to SNF vs home with home health if safe per PT/OT recommendations. Patient cannot go to SNF until he is 30 days post-tracheostomy (placed on 8/4). He is otherwise medically stable for discharge.   Consultants:   ENT  PCCM  Procedures:   AWAKE TRACHEOSTOMY (11/09/2019)  EGD AND PEG TUBE PLACEMENT (11/16/2019)  Antimicrobials:  None    Subjective: No chest pain or dyspnea. Cough improved.  Objective: Vitals:   11/28/19 2332 11/29/19 0300 11/29/19 0742 11/29/19 0835  BP:   (!) 119/91   Pulse: 90 86 (!) 101 88  Resp: 16 16 19 18   Temp:   98.7 F (37.1 C)   TempSrc:   Oral   SpO2: 98% 99% 100% 98%  Weight:      Height:        Intake/Output Summary (Last 24 hours) at 11/29/2019 1128 Last data filed at 11/29/2019 0911 Gross per 24 hour  Intake 2393 ml  Output 2875 ml  Net -482 ml   Filed Weights   11/19/19 0300 11/20/19 0353 11/21/19 0300  Weight: 73.7 kg 73.7 kg 73.7 kg    Examination:  General exam: Appears calm and comfortable Respiratory system: Clear to auscultation. Respiratory effort normal. No rhonchi. Cardiovascular system: S1 & S2 heard, RRR. No murmurs, rubs, gallops or clicks. Gastrointestinal  system: Abdomen is slightly distended, soft and nontender. No organomegaly or masses felt. Normal bowel sounds heard. Central nervous system: Alert and oriented. No  focal neurological deficits. Musculoskeletal: No edema. No calf tenderness Skin: No cyanosis. No rashes Psychiatry: Judgement and insight appear normal. Mood & affect appropriate.     Data Reviewed: I have personally reviewed following labs and imaging studies  CBC Lab Results  Component Value Date   WBC 7.4 11/26/2019   RBC 4.35 11/26/2019   HGB 13.4 11/26/2019   HCT 38.6 (L) 11/26/2019   MCV 88.7 11/26/2019   MCH 30.8 11/26/2019   PLT 343 11/26/2019   MCHC 34.7 11/26/2019   RDW 12.3 11/26/2019   LYMPHSABS 1.5 11/09/2019   MONOABS 0.7 11/09/2019   EOSABS 0.1 11/09/2019   BASOSABS 0.0 11/09/2019     Last metabolic panel Lab Results  Component Value Date   NA 136 11/26/2019   K 4.6 11/26/2019   CL 101 11/26/2019   CO2 23 11/26/2019   BUN 15 11/26/2019   CREATININE 0.76 11/26/2019   GLUCOSE 114 (H) 11/26/2019   GFRNONAA >60 11/26/2019   GFRAA >60 11/26/2019   CALCIUM 9.1 11/26/2019   PHOS 3.9 11/18/2019   PROT 7.2 11/09/2019   ALBUMIN 2.4 (L) 11/18/2019   LABGLOB 3.8 10/18/2019   AGRATIO 1.2 10/18/2019   BILITOT 0.3 11/09/2019   ALKPHOS 70 11/09/2019   AST 20 11/09/2019   ALT 30 11/09/2019   ANIONGAP 12 11/26/2019    CBG (last 3)  Recent Labs    11/28/19 2300 11/29/19 0604 11/29/19 0719  GLUCAP 111* 128* 106*     GFR: Estimated Creatinine Clearance: 101.1 mL/min (by C-G formula based on SCr of 0.76 mg/dL).  Coagulation Profile: No results for input(s): INR, PROTIME in the last 168 hours.  No results found for this or any previous visit (from the past 240 hour(s)).      Radiology Studies: No results found.      Scheduled Meds: . apixaban  5 mg Per Tube BID  . feeding supplement (PROSource TF)  45 mL Per Tube BID  . free water  200 mL Per Tube Q6H  . multivitamin  15 mL Per Tube Daily  . sennosides  5 mL Per Tube QHS  . sodium chloride flush  3 mL Intravenous Q12H  . thiamine  100 mg Per Tube Daily   Continuous Infusions: . feeding  supplement (OSMOLITE 1.5 CAL) 60 mL/hr at 11/28/19 2130     LOS: 20 days     Jacquelin Hawking, MD [facility] 11/29/2019, 11:28 AM  If 7PM-7AM, please contact night-coverage [email or web address]

## 2019-11-29 NOTE — Progress Notes (Signed)
  Speech Language Pathology Treatment: Dysphagia;Passy Muir Speaking valve  Patient Details Name: Marco Pittman MRN: [id number] DOB: [birth year] Today's Date: 11/29/2019 Time: 1310-1330 SLP Time Calculation (min) (ACUTE ONLY): 20 min  Assessment / Plan / Recommendation Clinical Impression  Pt seen for follow up after diet advancement to dys 2/thin liquids. Pt reports the food is "much better". No s/s aspiration observed or reported. Pt was encouraged to wear PMSV during all PO intake to maximize ability to cough/vocalize.   Will continue current diet and advance solids as pt tolerates. Safe swallow precautions posted at [facility].    HPI HPI: 59 yo male smoker presented to ER 7/26 with worsening shortness of breath and chest discomfort. Found to have severe hypertension with SBP > 200.  CT chest showed b/l PE and RLL ASD with mucus plugging concerning for aspiration pneumonia. PMH: ETOH, HTN, Pancreatitis, Aneurysm. ETT 7/26-27; reintubated 7/27-29 due to angioedema. 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 Seen 11/03/19 post extubation with regular and nectar thick recommended and upgraded to thin liquids the following day.    Pt now with severe dysphagia noted on MBS testing x2.  His trach is now changed to a number 6 cuffless and thus he is able to move air around his trach and speak for a few seconds before breathstacking noted. MBS 8/18 recommended puree and pudding thick liquids.       SLP Plan  Continue with current plan of care       Recommendations  Diet recommendations: Dysphagia 2 (fine chop);Thin liquid Liquids provided via: Cup;Straw Medication Administration: Crushed with puree Supervision: Patient able to self feed Compensations: Small sips/bites;Slow rate Postural Changes and/or Swallow Maneuvers: Seated upright 90 degrees      Patient may  use Passy-Muir Speech Valve: During all waking hours (remove during sleep);During all therapies with supervision;During PO intake/meals PMSV Supervision: Intermittent         Oral Care Recommendations: Oral care BID Follow up Recommendations: Skilled Nursing facility SLP Visit Diagnosis: Dysphagia, pharyngeal phase (R13.13) Plan: Continue with current plan of care       GO               Marco Pittman B. Murvin Natal, [facility], CCC-SLP Speech Language Pathologist Office: [phone] Pager: [phone]  Leigh Aurora 11/29/2019, 1:34 PM

## 2019-11-29 NOTE — TOC Progression Note (Signed)
Transition of Care [facility]) - Progression Note    Patient Details  Name: Marco Pittman MRN: [id number] Date of Birth: [birth year]  Transition of Care [facility]) CM/SW Contact  Lorri Frederick, LCSW Phone Number: 11/29/2019, 3:52 PM  Clinical Narrative:  CSW spoke with pt sister, Marco Pittman, with update on SNF placement.  Sister has concerns about pt going to stay with brother as brother lives next door to her, smokes a lot, and does not have a very clean environment.  Sister does report family has secured an apartment for pt that will be available Sept 1, so if pt is able to get services in home, this may be option.      Expected Discharge Plan: Home w Home Health Services Barriers to Discharge: Continued Medical Work up, English as a second language teacher, Equipment Delay  Expected Discharge Plan and Services Expected Discharge Plan: Home w Home Health Services     Post Acute Care Choice: Home Health Living arrangements for the past 2 months: Single Family Home Expected Discharge Date: 11/17/19                                     Social Determinants of Health (SDOH) Interventions    Readmission Risk Interventions No flowsheet data found.

## 2019-11-29 NOTE — Progress Notes (Signed)
Pt does not want to wear ATC. Pt states"it makes too much noise". Educated pt on importance of wearing for humidification purposes. Vital signs are stable. Will continue to monitor.

## 2019-11-30 LAB — GLUCOSE, CAPILLARY
Glucose-Capillary: 109 mg/dL — ABNORMAL HIGH (ref 70–99)
Glucose-Capillary: 119 mg/dL — ABNORMAL HIGH (ref 70–99)
Glucose-Capillary: 129 mg/dL — ABNORMAL HIGH (ref 70–99)
Glucose-Capillary: 119 mg/dL — ABNORMAL HIGH (ref 70–99)
Glucose-Capillary: 98 mg/dL (ref 70–99)
Glucose-Capillary: 133 mg/dL — ABNORMAL HIGH (ref 70–99)

## 2019-11-30 MED ORDER — OSMOLITE 1.5 CAL PO LIQD
720.0000 mL | ORAL | Status: DC
  Administered 2019-12-02: 720 mL
  Filled 2019-11-30 (×6): qty 948

## 2019-11-30 MED ORDER — ENSURE ENLIVE PO LIQD
237.0000 mL | Freq: Two times a day (BID) | ORAL | Status: DC
  Administered 2019-11-30 – 2019-12-05 (×9): 237 mL via ORAL
  Filled 2019-11-30: qty 237

## 2019-11-30 NOTE — TOC Progression Note (Signed)
Transition of Care [facility]) - Progression Note    Patient Details  Name: Marco Pittman MRN: [id number] Date of Birth: [birth year]  Transition of Care [facility]) CM/SW Contact  Lorri Frederick, LCSW Phone Number: 11/30/2019, 3:18 PM  Clinical Narrative:   Unknown Foley at [facility] still planning to take pt once her facility is allowed to restart admissions.  She is aware that pt trach will have been in place 30 days as o 12/10/19.  CSW updated pt on this plan and also on efforts to locate [facility] that could work with him in his apartment when that is available on 12/07/19.     Expected Discharge Plan: Home w Home Health Services Barriers to Discharge: Continued Medical Work up, English as a second language teacher, Equipment Delay  Expected Discharge Plan and Services Expected Discharge Plan: Home w Home Health Services     Post Acute Care Choice: Home Health Living arrangements for the past 2 months: Single Family Home Expected Discharge Date: 11/17/19                                     Social Determinants of Health (SDOH) Interventions    Readmission Risk Interventions No flowsheet data found.

## 2019-11-30 NOTE — Progress Notes (Signed)
  Speech Language Pathology Treatment: Dysphagia;Cognitive-Linquistic  Patient Details Name: Marco Pittman MRN: [id number] DOB: 08-11-1960 Today's Date: 11/30/2019 Time: 2585-2778 SLP Time Calculation (min) (ACUTE ONLY): 17 min  Assessment / Plan / Recommendation Clinical Impression  Marco Pittman is independent with donn and doffing speaking valve which he wore throughout the duration of our session. Respiratory ability is functional for conversation with therapist, staff and vocal quality mildly hoarse and speech 100% intelligible. He consumed thin liquids, solid texture and trial of pill with thin versus tube (with RN). No s/s aspiration or suspicion of difficulty coordinating dual textures. Educated pt to consume single pills if there are multiple to be consumed, with cup sip water. Upgraded texture to Dys 3 (mech soft with chopped meats), continue thin.  Pt asking appropriate questions re: discharge and accurately recalling information heard (discharge in one month versus one week) and uncertain what plan is. Continue to wear valve with po's and will follow with texture upgraded to Dys 3.    HPI HPI: 59 yo male smoker presented to ER 7/26 with worsening shortness of breath and chest discomfort. Found to have severe hypertension with SBP > 200.  CT chest showed b/l PE and RLL ASD with mucus plugging concerning for aspiration pneumonia. PMH: ETOH, HTN, Pancreatitis, Aneurysm. ETT 7/26-27; reintubated 7/27-29 due to angioedema. 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 Seen 11/03/19 post extubation with regular and nectar thick recommended and upgraded to thin liquids the following day.    Pt now with severe dysphagia noted on MBS testing x2.  His trach is now changed to a number 6 cuffless and thus he is able to move air around his trach and speak for a few seconds  before breathstacking noted. MBS 8/18 recommended puree and pudding thick liquids.       SLP Plan  Continue with current plan of care       Recommendations  Diet recommendations: Dysphagia 3 (mechanical soft);Thin liquid Liquids provided via: Cup;Straw Medication Administration: Whole meds with liquid (cup ) Supervision: Patient able to self feed Compensations: Small sips/bites;Slow rate Postural Changes and/or Swallow Maneuvers: Seated upright 90 degrees      Patient may use Passy-Muir Speech Valve: During all waking hours (remove during sleep);During all therapies with supervision;During PO intake/meals PMSV Supervision: Intermittent MD: Please consider changing trach tube to : Smaller size         Oral Care Recommendations: Oral care BID Follow up Recommendations: Home health SLP SLP Visit Diagnosis: Dysphagia, pharyngeal phase (R13.13);Aphonia (R49.1) Plan: Continue with current plan of care                       Marco Pittman 11/30/2019, 10:30 AM  Marco Pittman.Ed Nurse, children's [phone] Office [phone]

## 2019-11-30 NOTE — Progress Notes (Signed)
PROGRESS NOTE    Marco Pittman  HQP:[id number] DOB: Jul 03, 1960 DOA: 11/09/2019 PCP: Marcine Matar, MD    Chief Complaint  Patient presents with  . Shortness of Breath    Brief Narrative:  29 y male with history of hypertension, pancreatitis, alcohol abuse, primary biliary cirrhosis presents with shortness of breath in the setting of glottic stenosis requiring emergent tracheostomy.  PCCM recommends Shiley cuffless trach with a backup#4 cuffless track with suction and trach supplies on discharge.  He will also need outpatient SLP follow-up on discharge.   Assessment & Plan:   Active Problems:   Essential hypertension, benign   History of alcohol abuse   Acute respiratory failure with hypoxia (HCC)   Glottic stenosis   Dysphagia   Acute respiratory failure with hypoxia secondary to glottic stenosis Underwent emergent tracheostomy on 11/16/2019 by ENT Currently stable on tracheostomy collar with oxygen 5 L.  Secretions have improved.  Recommend guaifenesin tube and pulmonary toilet.  Pulmonary recommendations on discharge. Home Trach / Discharge Needs: -DME ordered for discharge will need to include: 6. Shiley cuffless trach with a backup #4 cuffless trach, suction, trach supplies to include gauze and cleaning supplies as well as an additional PMV. He will also need outpatient SLP follow-up. And patient can follow-up in the trach clinic with [name], ACNP [phone].   History of pulmonary embolism Continue with Eliquis as outpatient.    Severe oropharyngeal dysphagia S/p PEG placement by general surgery on 11/16/2019.  SLP evaluation recommending dysphagia 3 diet. Repeat SLP evaluation on discharge.  Dietary consulted and recommended nocturnal feeds at this time so we can improve overall nutrition during the day.   Essential hypertension    Well-controlled blood pressure parameters at this time.   Alcohol abuse No signs of withdrawal at this  time.    Chronic pancreatitis Stable asymptomatic.  Anemia of chronic disease Globin appears to be stable at this time.  DVT prophylaxis: ELIQUIS.  Code Status: full code.  Family Communication: NONE Disposition:   Status is: Inpatient  Remains inpatient appropriate because:Unsafe d/c plan   Dispo:  Patient From: Home  Planned Disposition: Skilled Nursing Facility  Expected discharge date: 12/10/19  Medically stable for discharge: Yes        Consultants:   PCCM     Procedures:    Antimicrobials: none   Subjective: No complaints   Objective: Vitals:   11/30/19 0420 11/30/19 0727 11/30/19 0813 11/30/19 1203  BP:   117/84   Pulse: 89 93 98 97  Resp: 18 19 19 18   Temp:   97.8 F (36.6 C)   TempSrc:      SpO2: 93% 95% 100% 100%  Weight:      Height:        Intake/Output Summary (Last 24 hours) at 11/30/2019 1316 Last data filed at 11/30/2019 0554 Gross per 24 hour  Intake 1920 ml  Output 3125 ml  Net -1205 ml   Filed Weights   11/19/19 0300 11/20/19 0353 11/21/19 0300  Weight: 73.7 kg 73.7 kg 73.7 kg    Examination:  General exam: Appears calm and comfortable  Respiratory system: Clear to auscultation. Respiratory effort normal. Cardiovascular system: S1 & S2 heard, RRR.  No pedal edema. Gastrointestinal system: Abdomen is nondistended, soft and nontender. Normal bowel sounds heard. Central nervous system: Alert and oriented. No focal neurological deficits. Extremities: Symmetric 5 x 5 power. Skin: No rashes, lesions or ulcers Psychiatry:  Mood & affect appropriate.  Data Reviewed: I have personally reviewed following labs and imaging studies  CBC: Recent Labs  Lab 11/26/19 1127  WBC 7.4  HGB 13.4  HCT 38.6*  MCV 88.7  PLT 343    Basic Metabolic Panel: Recent Labs  Lab 11/26/19 1127  NA 136  K 4.6  CL 101  CO2 23  GLUCOSE 114*  BUN 15  CREATININE 0.76  CALCIUM 9.1    GFR: Estimated Creatinine Clearance:  101.1 mL/min (by C-G formula based on SCr of 0.76 mg/dL).  Liver Function Tests: No results for input(s): AST, ALT, ALKPHOS, BILITOT, PROT, ALBUMIN in the last 168 hours.  CBG: Recent Labs  Lab 11/29/19 1955 11/29/19 2341 11/30/19 0354 11/30/19 0728 11/30/19 1226  GLUCAP 118* 98 109* 119* 129*     No results found for this or any previous visit (from the past 240 hour(s)).       Radiology Studies: No results found.      Scheduled Meds: . apixaban  5 mg Per Tube BID  . feeding supplement (ENSURE ENLIVE)  237 mL Oral BID BM  . feeding supplement (OSMOLITE 1.5 CAL)  720 mL Per Tube Q24H  . feeding supplement (PROSource TF)  45 mL Per Tube BID  . free water  200 mL Per Tube Q6H  . multivitamin  15 mL Per Tube Daily  . sennosides  5 mL Per Tube QHS  . sodium chloride flush  3 mL Intravenous Q12H  . thiamine  100 mg Per Tube Daily   Continuous Infusions:   LOS: 21 days     Kathlen Mody, MD [facility]   To contact the attending provider between 7A-7P or the covering provider during after hours 7P-7A, please log into the web site [email or web address] and access using universal [place] password for that web site. If you do not have the password, please call the hospital operator.  11/30/2019, 1:16 PM

## 2019-11-30 NOTE — Progress Notes (Signed)
Nutrition Follow-up  DOCUMENTATION CODES:   Not applicable  INTERVENTION:  Ensure Enlive po BID, each supplement provides 350 kcal and 20 grams of protein  Recommend transitioning pt to nocturnal tube feeding via PEG: -Osmolite 1.5 cal @60ml /hr for 12 hours from 1800-0600 -76ml Prosource TF BID -Free water per MD, currently 48m Q6H  Tube feeding regimen will provide 1160 kcals (meets 52% minimum needs), 67grams of protein, free water   NUTRITION DIAGNOSIS:   Inadequate oral intake related to inability to eat as evidenced by NPO status.  Progressing, pt now on Dysphagia 2 diet with thin liquids   GOAL:   Patient will meet greater than or equal to 90% of their needs  Progressing  MONITOR:   Diet advancement, TF tolerance, Skin, Weight trends, Labs, I & O's  REASON FOR ASSESSMENT:   Consult Enteral/tube feeding initiation and management  ASSESSMENT:   59yo male who presents with chief complaint of shortness of breath, on evaluation was found to have subglottic stenosis. Pt underwent emergent awake tracheostomy 8/4.  8/6 Cortrak placed (gastric) 8/9 failed MBS, Cortrak removed 8/11 s/p EGD and PEG placement 8/18 MBS, dysphagia 1 with pudding thick liquids ordered  8/23 diet advanced to dysphagia 2 with thin liquids 8/25 diet advanced to dysphagia 3 with thin liquids  Per MD, pt cannot go to SNF until he is 30 days post-tracheostomy (placed 8/4). Pt is otherwise medically stable for discharge.   Discussed pt with RN who reports pt is doing well with meals. Attempted to visit pt; pt sleeping. RD observed 100% completed meal tray in room.   Since pt's PO intake has improved, recommend reducing tube feeding to nocturnal only to assess if pt can maintain adequate PO intake.   PO Intake: 80% x 1 recorded meal  Current TF: Osmolite 1.5 cal @ 65ml/hr, 28ml Prosource TF BID, 48m free water Q6H  Labs reviewed.  Medications: MVI, Senokot, Thiamine   Diet  Order:   Diet Order            DIET DYS 3 Room service appropriate? Yes with Assist; Fluid consistency: Thin  Diet effective now                 EDUCATION NEEDS:   Not appropriate for education at this time  Skin:  Skin Assessment: Skin Integrity Issues: Skin Integrity Issues:: Incisions Incisions: neck, abdomen  Last BM:  8/24  Height:   Ht Readings from Last 1 Encounters:  11/10/19 6\' 1"  (1.854 m)    Weight:   Wt Readings from Last 1 Encounters:  11/21/19 73.7 kg    BMI:  Body mass index is 21.44 kg/m.  Estimated Nutritional Needs:   Kcal:  2200-2400  Protein:  110-120 grams  Fluid:  >/= 2 L/day    , MS, RD, LDN RD pager number and weekend/on-call pager number located in Amion.

## 2019-11-30 NOTE — Plan of Care (Signed)
Patient tolerating food and some medications by mouth

## 2019-12-01 LAB — GLUCOSE, CAPILLARY
Glucose-Capillary: 97 mg/dL (ref 70–99)
Glucose-Capillary: 106 mg/dL — ABNORMAL HIGH (ref 70–99)
Glucose-Capillary: 98 mg/dL (ref 70–99)
Glucose-Capillary: 87 mg/dL (ref 70–99)
Glucose-Capillary: 142 mg/dL — ABNORMAL HIGH (ref 70–99)

## 2019-12-01 NOTE — Progress Notes (Signed)
Pt is refusing tube feeds. Pt feels that he doesn't need it since he is eating.

## 2019-12-01 NOTE — Evaluation (Signed)
Physical Therapy Evaluation Patient Details Name: Marco Pittman MRN: [id number] DOB: 07-14-1960 Today's Date: 12/01/2019   History of Present Illness  59 yo male smoker presented to ER with shortness of breath and stridor. 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 PMH of HTN, dysphagia, pancreatitis, aneurysm, angioedema, and a PE (11/05/19) on Eliquis. Trach collar on RA.    Clinical Impression  Patient was received sitting up in recliner chair with trach collar on RA at start of physical therapy and got up to make his bed prior to subjective interview. He will be moving soon to either an apartment or mobile home with his brother and was independent prior to admission to hospital. Patient was able to transfer sit to stand independently and walk approximately 400' and ascend and descend a flight of stairs independently with no use of AD. SpO2 at 100% after mobility. He was left sitting up in chair with all needs within reach. Recommend no PT f/u after d/c. He is independent with mobility and at baseline level of functioning, signing off. Thank you for the opportunity to work with this patient.     Follow Up Recommendations No PT follow up    Equipment Recommendations  None recommended by PT    Recommendations for Other Services       Precautions / Restrictions Precautions Precautions: None Restrictions Weight Bearing Restrictions: No      Mobility  Bed Mobility               General bed mobility comments: oob in chair on arrival  Transfers Overall transfer level: Independent                  Ambulation/Gait Ambulation/Gait assistance: Independent Gait Distance (Feet): 400 Feet Assistive device: None Gait Pattern/deviations: Step-through pattern Gait velocity: WFL   General Gait Details: steady and safe with age appropriate speeds in a home like environment  Stairs   Stairs assistance: Independent Stair Management: One rail Right;Alternating  pattern;Forwards Number of Stairs: 10 General stair comments: flight. supervision for safety, step-over-step with use of railing on R to steady self as needed.  Wheelchair Mobility    Modified Rankin (Stroke Patients Only)       Balance Overall balance assessment: Independent                                           Pertinent Vitals/Pain Pain Assessment: No/denies pain Pain Intervention(s): Monitored during session;Limited activity within patient's tolerance    Home Living Family/patient expects to be discharged to:: Private residence (*patient is moving very soon, but is unsure if he is moving to apartment or mobile home with brother at this time.) Living Arrangements: Non-relatives/Friends Available Help at Discharge: Friend(s) Type of Home: Apartment Home Access: Level entry   Entrance Stairs-Number of Steps: moving in a few days, unsure of steps Home Layout: One level Home Equipment: None Additional Comments: works in Occupational psychologist houses at [employer]    Prior Function Level of Independence: Independent         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     Hand Dominance   Dominant Hand: Right    Extremity/Trunk Assessment   Upper Extremity Assessment Upper Extremity Assessment: Defer to OT evaluation    Lower Extremity Assessment Lower Extremity Assessment: Overall WFL for tasks assessed    Cervical / Trunk Assessment Cervical / Trunk Assessment: Normal  Communication   Communication: Tracheostomy;Passy-Muir valve  Cognition Arousal/Alertness: Awake/alert Behavior During Therapy: WFL for tasks assessed/performed Overall Cognitive Status: Within Functional Limits for tasks assessed                                        General Comments      Exercises     Assessment/Plan    PT Assessment Patent does not need any further PT services  PT Problem List         PT Treatment Interventions      PT  Goals (Current goals can be found in the Care Plan section)  Acute Rehab PT Goals Patient Stated Goal: to go home Time For Goal Achievement: 12/15/19 Potential to Achieve Goals: Good    Frequency     Barriers to discharge        Co-evaluation               AM-PAC PT "6 Clicks" Mobility  Outcome Measure Help needed turning from your back to your side while in a flat bed without using bedrails?: None Help needed moving from lying on your back to sitting on the side of a flat bed without using bedrails?: None Help needed moving to and from a bed to a chair (including a wheelchair)?: None Help needed standing up from a chair using your arms (e.g., wheelchair or bedside chair)?: None Help needed to walk in hospital room?: None Help needed climbing 3-5 steps with a railing? : None 6 Click Score: 24    End of Session Equipment Utilized During Treatment: Gait belt Activity Tolerance: Patient tolerated treatment well Patient left: in chair;with call bell/phone within reach Nurse Communication: Mobility status PT Visit Diagnosis: Other abnormalities of gait and mobility (R26.89)    Time:  -      Charges:              Elisha Ponder, SPT, ATC

## 2019-12-01 NOTE — TOC Progression Note (Signed)
Transition of Care [facility]) - Progression Note    Patient Details  Name: Marco Pittman MRN: [id number] Date of Birth: Mar 06, 1961  Transition of Care [facility]) CM/SW Contact  Lorri Frederick, LCSW Phone Number: 12/01/2019, 9:17 AM  Clinical Narrative:   CSW spoke to [place] from [facility]. [phone], [phone]. Provided update on SNF delay due to trach being new and issues with admissions at [facility]. Also discussed barrier of HH that will accept the [facility].  Dorene Grebe will look into [facility] options for the [employer] and call back.     Expected Discharge Plan: Home w Home Health Services Barriers to Discharge: Continued Medical Work up, English as a second language teacher, Equipment Delay  Expected Discharge Plan and Services Expected Discharge Plan: Home w Home Health Services     Post Acute Care Choice: Home Health Living arrangements for the past 2 months: Single Family Home Expected Discharge Date: 11/17/19                                     Social Determinants of Health (SDOH) Interventions    Readmission Risk Interventions No flowsheet data found.

## 2019-12-01 NOTE — Progress Notes (Signed)
PROGRESS NOTE    Marco Pittman  OIZ:[id number] DOB: 1961-01-04 DOA: 11/09/2019 PCP: Marcine Matar, MD    Chief Complaint  Patient presents with  . Shortness of Breath    Brief Narrative:  36 y male with history of hypertension, pancreatitis, alcohol abuse, primary biliary cirrhosis presents with shortness of breath in the setting of glottic stenosis requiring emergent tracheostomy.  PCCM recommends Shiley cuffless trach with a backup#4 cuffless track with suction and trach supplies on discharge.  He will also need outpatient SLP follow-up on discharge. Patient seen and examined at bedside, no new complaints at this time.  Assessment & Plan:   Active Problems:   Essential hypertension, benign   History of alcohol abuse   Acute respiratory failure with hypoxia (HCC)   Glottic stenosis   Dysphagia   Acute respiratory failure with hypoxia secondary to glottic stenosis Underwent emergent tracheostomy on 11/16/2019 by ENT Currently stable on tracheostomy collar with oxygen 5 L.  Secretions have improved.  Recommend guaifenesin via G-tube and pulmonary toilet.  Pulmonary recommendations on discharge. Home Trach / Discharge Needs: -DME ordered for discharge will need to include: 6. Shiley cuffless trach with a backup #4 cuffless trach, suction, trach supplies to include gauze and cleaning supplies as well as an additional PMV. He will also need outpatient SLP follow-up. And patient can follow-up in the trach clinic with [name], ACNP [phone].   History of pulmonary embolism Continue with Eliquis     Severe oropharyngeal dysphagia S/p PEG placement by general surgery on 11/16/2019.  SLP evaluation recommending dysphagia 3 diet. Repeat SLP evaluation on discharge.  Dietary consulted and recommended nocturnal feeds at this time so we can improve overall nutrition during the day.   Essential hypertension  well controlled blood pressure  parameters   Alcohol abuse No signs of withdrawal at this time.    Chronic pancreatitis Stable and asymptomatic  Anemia of chronic disease Hemoglobin stable around 13.  DVT prophylaxis: ELIQUIS.  Code Status: full code.  Family Communication: NONE Disposition:   Status is: Inpatient  Remains inpatient appropriate because:Unsafe d/c plan   Dispo:  Patient From: Home  Planned Disposition: Skilled Nursing Facility  Expected discharge date: 12/10/19  Medically stable for discharge: Yes        Consultants:   PCCM     Procedures: AWAKE TRACHEOSTOMY (11/09/2019)  EGD AND PEG TUBE PLACEMENT (11/16/2019)   Antimicrobials: none   Subjective: No chest pain, shortness of breath, nausea vomiting or abdominal pain   Objective: Vitals:   12/01/19 0801 12/01/19 0940 12/01/19 1120 12/01/19 1603  BP:      Pulse: 88  90 89  Resp: 18  17 18   Temp:      TempSrc:      SpO2: 98% 100% 98% 100%  Weight:      Height:        Intake/Output Summary (Last 24 hours) at 12/01/2019 1608 Last data filed at 11/30/2019 2200 Gross per 24 hour  Intake 240 ml  Output 650 ml  Net -410 ml   Filed Weights   11/19/19 0300 11/20/19 0353 11/21/19 0300  Weight: 73.7 kg 73.7 kg 73.7 kg    Examination:  General exam: Alert and comfortable, not in distress.  S/p trach on oxygen  Respiratory system: Diminished air entry at bases, no wheezing or rhonchi, no tachypnea Cardiovascular system: S1-S2 heard, regular rate rhythm, no JVD. Gastrointestinal system: Abdomen is soft, nontender, bowel sounds normal Central nervous system: Alert, following  commands and answering questions appropriately Extremities: No pedal edema Skin: No rashes seen Psychiatry: Mood is appropriate     Data Reviewed: I have personally reviewed following labs and imaging studies  CBC: Recent Labs  Lab 11/26/19 1127  WBC 7.4  HGB 13.4  HCT 38.6*  MCV 88.7  PLT 343    Basic Metabolic Panel: Recent  Labs  Lab 11/26/19 1127  NA 136  K 4.6  CL 101  CO2 23  GLUCOSE 114*  BUN 15  CREATININE 0.76  CALCIUM 9.1    GFR: Estimated Creatinine Clearance: 101.1 mL/min (by C-G formula based on SCr of 0.76 mg/dL).  Liver Function Tests: No results for input(s): AST, ALT, ALKPHOS, BILITOT, PROT, ALBUMIN in the last 168 hours.  CBG: Recent Labs  Lab 11/30/19 1943 11/30/19 2323 12/01/19 0309 12/01/19 0742 12/01/19 1137  GLUCAP 98 133* 97 106* 98     No results found for this or any previous visit (from the past 240 hour(s)).       Radiology Studies: No results found.      Scheduled Meds: . apixaban  5 mg Per Tube BID  . feeding supplement (ENSURE ENLIVE)  237 mL Oral BID BM  . feeding supplement (OSMOLITE 1.5 CAL)  720 mL Per Tube Q24H  . feeding supplement (PROSource TF)  45 mL Per Tube BID  . free water  200 mL Per Tube Q6H  . multivitamin  15 mL Per Tube Daily  . sennosides  5 mL Per Tube QHS  . sodium chloride flush  3 mL Intravenous Q12H  . thiamine  100 mg Per Tube Daily   Continuous Infusions:   LOS: 22 days     Kathlen Mody, MD [facility]   To contact the attending provider between 7A-7P or the covering provider during after hours 7P-7A, please log into the web site [email or web address] and access using universal [place] password for that web site. If you do not have the password, please call the hospital operator.  12/01/2019, 4:08 PM

## 2019-12-02 LAB — GLUCOSE, CAPILLARY: Glucose-Capillary: 82 mg/dL (ref 70–99)

## 2019-12-02 NOTE — Plan of Care (Signed)

## 2019-12-02 NOTE — Progress Notes (Signed)
PROGRESS NOTE    Marco Pittman  JOI:[id number] DOB: 1960-12-18 DOA: 11/09/2019 PCP: Marcine Matar, MD    Chief Complaint  Patient presents with  . Shortness of Breath    Brief Narrative:  24 y male with history of hypertension, pancreatitis, alcohol abuse, primary biliary cirrhosis presents with shortness of breath in the setting of glottic stenosis requiring emergent tracheostomy.  PCCM recommends Shiley cuffless trach with a backup#4 cuffless track with suction and trach supplies on discharge.  He will also need outpatient SLP follow-up on discharge. Patient seen and examined at bedside, no new complaints.  Patient wants to know when the G-tube can be time out. Assessment & Plan:   Active Problems:   Essential hypertension, benign   History of alcohol abuse   Acute respiratory failure with hypoxia (HCC)   Glottic stenosis   Dysphagia   Acute respiratory failure with hypoxia secondary to glottic stenosis Underwent emergent tracheostomy on 11/16/2019 by ENT Currently stable on tracheostomy collar on room air.   Secretions have improved.  Recommend pulmonary toilet Pulmonary recommendations on discharge. Home Trach / Discharge Needs: -DME ordered for discharge will need to include: 6. Shiley cuffless trach with a backup #4 cuffless trach, suction, trach supplies to include gauze and cleaning supplies as well as an additional PMV. He will also need outpatient SLP follow-up. And patient can follow-up in the trach clinic with [name], ACNP [phone]. Currently waiting for 30 days to complete post tracheostomy so he can be changed to cuffless.  History of pulmonary embolism Continue with Eliquis patient denies any chest pain or shortness of breath or palpitations at this time.    Severe oropharyngeal dysphagia S/p PEG placement by general surgery on 11/16/2019.  SLP evaluation recommending dysphagia 3 diet. Repeat SLP evaluation on discharge.   Dietary  consulted and recommended nocturnal feeds at this time so we can improve overall nutrition during the day. SLP evaluation recommending regular diet with thin liquids.   Essential hypertension Blood pressure parameters are very well controlled.   Alcohol abuse No signs of withdrawal at this time    Chronic pancreatitis Stable and asymptomatic  Anemia of chronic disease Hemoglobin stable around 13.  No new labs  DVT prophylaxis: ELIQUIS.  Code Status: full code.  Family Communication: NONE Disposition:   Status is: Inpatient  Remains inpatient appropriate because:Unsafe d/c plan   Dispo:  Patient From: Home  Planned Disposition: Skilled Nursing Facility  Expected discharge date: 12/10/19  Medically stable for discharge: Yes        Consultants:   PCCM     Procedures: AWAKE TRACHEOSTOMY (11/09/2019)  EGD AND PEG TUBE PLACEMENT (11/16/2019)   Antimicrobials: none   Subjective: No new complaints, wants to know when the G-tube can be removed   Objective: Vitals:   12/01/19 2305 12/02/19 0323 12/02/19 0730 12/02/19 0737  BP: 95/72   115/70  Pulse: 91 88 78 87  Resp: 18 16 17 16   Temp: 97.6 F (36.4 C)   98.3 F (36.8 C)  TempSrc: Oral     SpO2: 100% 100% 94% 96%  Weight:      Height:        Intake/Output Summary (Last 24 hours) at 12/02/2019 1022 Last data filed at 12/01/2019 2033 Gross per 24 hour  Intake 240 ml  Output --  Net 240 ml   Filed Weights   11/19/19 0300 11/20/19 0353 11/21/19 0300  Weight: 73.7 kg 73.7 kg 73.7 kg    Examination:  General exam: Alert and comfortable, not in any kind of distress s/p trach on collar.  Respiratory system: Air entry fair bilateral, no wheezing or rhonchi, Cardiovascular system: S1-S2 heard, regular rate and rhythm, no JVD, no pedal edema Gastrointestinal system: Abdomen is soft, nontender, nondistended, bowel sounds normal. Central nervous system: Alert and oriented, grossly nonfocal Extremities:  No pedal edema Skin: No rashes Psychiatry: Mood is appropriate    Data Reviewed: I have personally reviewed following labs and imaging studies  CBC: Recent Labs  Lab 11/26/19 1127  WBC 7.4  HGB 13.4  HCT 38.6*  MCV 88.7  PLT 343    Basic Metabolic Panel: Recent Labs  Lab 11/26/19 1127  NA 136  K 4.6  CL 101  CO2 23  GLUCOSE 114*  BUN 15  CREATININE 0.76  CALCIUM 9.1    GFR: Estimated Creatinine Clearance: 101.1 mL/min (by C-G formula based on SCr of 0.76 mg/dL).  Liver Function Tests: No results for input(s): AST, ALT, ALKPHOS, BILITOT, PROT, ALBUMIN in the last 168 hours.  CBG: Recent Labs  Lab 12/01/19 0742 12/01/19 1137 12/01/19 1613 12/01/19 1911 12/02/19 0741  GLUCAP 106* 98 87 142* 82     No results found for this or any previous visit (from the past 240 hour(s)).       Radiology Studies: No results found.      Scheduled Meds: . apixaban  5 mg Per Tube BID  . feeding supplement (ENSURE ENLIVE)  237 mL Oral BID BM  . feeding supplement (OSMOLITE 1.5 CAL)  720 mL Per Tube Q24H  . feeding supplement (PROSource TF)  45 mL Per Tube BID  . free water  200 mL Per Tube Q6H  . multivitamin  15 mL Per Tube Daily  . sennosides  5 mL Per Tube QHS  . sodium chloride flush  3 mL Intravenous Q12H  . thiamine  100 mg Per Tube Daily   Continuous Infusions:   LOS: 23 days     Kathlen Mody, MD [facility]   To contact the attending provider between 7A-7P or the covering provider during after hours 7P-7A, please log into the web site [email or web address] and access using universal [place] password for that web site. If you do not have the password, please call the hospital operator.  12/02/2019, 10:22 AM

## 2019-12-02 NOTE — Progress Notes (Signed)
  Speech Language Pathology Treatment: Dysphagia;Passy Muir Speaking valve  Patient Details Name: Marco Pittman MRN: [id number] DOB: 1960/11/16 Today's Date: 12/02/2019 Time: 3016-0109 SLP Time Calculation (min) (ACUTE ONLY): 16 min  Assessment / Plan / Recommendation Clinical Impression  Pt observed with regular texture solids which he is independent with, not requiring cues. Therapist will upgrade from chopped meats to regular and switched to "no room service" per pt request. Donned speaking valve on arrival. He stated "I realized yesterday I can talk without it too."We discussed benefits of increase respiratory volume for longer sentences per breath and laryngeal protection during swallowing. Pt agreed and does prefer using valve. Therapist demonstrated removal of PMV leash from trach collar/velco and pt able to return demonstrate. SLP cleaned with mild soap and water and allow to air dry. Dysphagia goals met today and will follow briefly to wrap up education re: speaking valve.    HPI HPI: 59 yo male smoker presented to ER 7/26 with worsening shortness of breath and chest discomfort. Found to have severe hypertension with SBP > 200.  CT chest showed b/l PE and RLL ASD with mucus plugging concerning for aspiration pneumonia. PMH: ETOH, HTN, Pancreatitis, Aneurysm. ETT 7/26-27; reintubated 7/27-29 due to angioedema. 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 Seen 11/03/19 post extubation with regular and nectar thick recommended and upgraded to thin liquids the following day.    Pt now with severe dysphagia noted on MBS testing x2.  His trach is now changed to a number 6 cuffless and thus he is able to move air around his trach and speak for a few seconds before breathstacking noted. MBS 8/18 recommended puree and pudding thick liquids.       SLP Plan  Other  (Comment) (swallow goals met- will follow up with cleaning PMV)       Recommendations  Diet recommendations: Regular;Thin liquid Liquids provided via: Cup;Straw Medication Administration: Whole meds with liquid Supervision: Patient able to self feed Compensations: Small sips/bites;Slow rate Postural Changes and/or Swallow Maneuvers: Seated upright 90 degrees      Patient may use Passy-Muir Speech Valve: During all waking hours (remove during sleep);During all therapies with supervision;During PO intake/meals PMSV Supervision: Intermittent         Oral Care Recommendations: Oral care BID Follow up Recommendations: Home health SLP SLP Visit Diagnosis: Aphonia (R49.1);Dysphagia, pharyngeal phase (R13.13) Plan: Other (Comment) (swallow goals met- will follow up with cleaning PMV)                       Houston Siren 12/02/2019, 9:13 AM  Orbie Pyo Colvin Caroli.Ed Risk analyst [phone] Office [phone]

## 2019-12-03 NOTE — Progress Notes (Signed)
Patient stated that he does not want to wear his monitor or oxygen. Patient stated that he did not need it. RT informed patient of the need to wear both but patient still refused.

## 2019-12-03 NOTE — Progress Notes (Signed)
Pt refuses to wear ATC and pulse ox. RT explained to pt the importance of wearing ATC for humidity. Pt understands importance.

## 2019-12-03 NOTE — Plan of Care (Signed)

## 2019-12-03 NOTE — Progress Notes (Signed)
PROGRESS NOTE    Marco Pittman  EGB:[id number] DOB: [birth year] DOA: 11/09/2019 PCP: Marcine Matar, MD    Chief Complaint  Patient presents with  . Shortness of Breath    Brief Narrative:  24 y male with history of hypertension, pancreatitis, alcohol abuse, primary biliary cirrhosis presents with shortness of breath in the setting of glottic stenosis requiring emergent tracheostomy.  PCCM recommends Shiley cuffless trach with a backup#4 cuffless track with suction and trach supplies on discharge.  He will also need outpatient SLP follow-up on discharge. Patient seen and examined at bedside. No new complaints today. He is up and walking around.   Assessment & Plan:   Active Problems:   Essential hypertension, benign   History of alcohol abuse   Acute respiratory failure with hypoxia (HCC)   Glottic stenosis   Dysphagia   Acute respiratory failure with hypoxia secondary to glottic stenosis Underwent emergent tracheostomy on 11/16/2019 by ENT Currently stable on tracheostomy collar on room air.   Secretions have improved.  Recommend pulmonary toilet and using ATC and pulse OX.  Pulmonary recommendations on discharge. Home Trach / Discharge Needs: -DME ordered for discharge will need to include: 6. Shiley cuffless trach with a backup #4 cuffless trach, suction, trach supplies to include gauze and cleaning supplies as well as an additional PMV. He will also need outpatient SLP follow-up. And patient can follow-up in the trach clinic with [name], ACNP [phone]. Currently waiting for 30 days to complete post tracheostomy so he can be changed to cuffless.  History of pulmonary embolism Continue with Eliquis patient denies any chest pain or shortness of breath or palpitations at this time.    Severe oropharyngeal dysphagia S/p PEG placement by general surgery on 11/16/2019.  SLP evaluation regular diet with think liquids. Repeat SLP evaluation post discharge.    Dietary consulted and recommended nocturnal feeds at this time so we can improve overall nutrition during the day. SLP evaluation recommending regular diet with thin liquids. No new complaints at this time.    Essential hypertension BP parameters are well controlled.    Alcohol abuse No signs of withdrawal.     Chronic pancreatitis Stable and asymptomatic  Anemia of chronic disease Hemoglobin stable around 13.  No new labs  DVT prophylaxis: ELIQUIS.  Code Status: full code.  Family Communication: NONE Disposition:   Status is: Inpatient  Remains inpatient appropriate because:Unsafe d/c plan   Dispo:  Patient From: Home  Planned Disposition: Skilled Nursing Facility  Expected discharge date: 12/10/19  Medically stable for discharge: Yes        Consultants:   PCCM     Procedures: AWAKE TRACHEOSTOMY (11/09/2019)  EGD AND PEG TUBE PLACEMENT (11/16/2019)   Antimicrobials: none   Subjective: No new complaints.    Objective: Vitals:   12/03/19 0740 12/03/19 0753 12/03/19 1126 12/03/19 1151  BP: 113/85     Pulse: 87 92 (!) 107 100  Resp: 16 16 (!) 22 20  Temp: 98.4 F (36.9 C)     TempSrc:      SpO2: 97% 94% 93%   Weight:      Height:        Intake/Output Summary (Last 24 hours) at 12/03/2019 1223 Last data filed at 12/02/2019 1925 Gross per 24 hour  Intake 120 ml  Output --  Net 120 ml   Filed Weights   11/19/19 0300 11/20/19 0353 11/21/19 0300  Weight: 73.7 kg 73.7 kg 73.7 kg    Examination:  General exam: alert and comfortable, on trach collar.   Respiratory system: air entry fair, no wheezing or rhonchi.  Cardiovascular system: S1S2 RRR, no JVD.  Gastrointestinal system: abdomen Is soft non tender non distended bowel sounds good.  Central nervous system: Alert and oriented, grossly nonfocal Extremities: No pedal edema Skin: No rashes Psychiatry: Mood is appropriate    Data Reviewed: I have personally reviewed following labs  and imaging studies  CBC: No results for input(s): WBC, NEUTROABS, HGB, HCT, MCV, PLT in the last 168 hours.  Basic Metabolic Panel: No results for input(s): NA, K, CL, CO2, GLUCOSE, BUN, CREATININE, CALCIUM, MG, PHOS in the last 168 hours.  GFR: Estimated Creatinine Clearance: 101.1 mL/min (by C-G formula based on SCr of 0.76 mg/dL).  Liver Function Tests: No results for input(s): AST, ALT, ALKPHOS, BILITOT, PROT, ALBUMIN in the last 168 hours.  CBG: Recent Labs  Lab 12/01/19 0742 12/01/19 1137 12/01/19 1613 12/01/19 1911 12/02/19 0741  GLUCAP 106* 98 87 142* 82     No results found for this or any previous visit (from the past 240 hour(s)).       Radiology Studies: No results found.      Scheduled Meds: . apixaban  5 mg Per Tube BID  . feeding supplement (ENSURE ENLIVE)  237 mL Oral BID BM  . feeding supplement (OSMOLITE 1.5 CAL)  720 mL Per Tube Q24H  . feeding supplement (PROSource TF)  45 mL Per Tube BID  . free water  200 mL Per Tube Q6H  . multivitamin  15 mL Per Tube Daily  . sennosides  5 mL Per Tube QHS  . sodium chloride flush  3 mL Intravenous Q12H  . thiamine  100 mg Per Tube Daily   Continuous Infusions:   LOS: 24 days     Kathlen Mody, MD [facility]   To contact the attending provider between 7A-7P or the covering provider during after hours 7P-7A, please log into the web site [email or web address] and access using universal [place] password for that web site. If you do not have the password, please call the hospital operator.  12/03/2019, 12:23 PM

## 2019-12-04 NOTE — Plan of Care (Signed)

## 2019-12-04 NOTE — Progress Notes (Signed)
PROGRESS NOTE    Marco Pittman  SPQ:[id number] DOB: 1960/06/02 DOA: 11/09/2019 PCP: Marcine Matar, MD    Chief Complaint  Patient presents with  . Shortness of Breath    Brief Narrative:  46 y male with history of hypertension, pancreatitis, alcohol abuse, primary biliary cirrhosis presents with shortness of breath in the setting of glottic stenosis requiring emergent tracheostomy.  PCCM recommends Shiley cuffless trach with a backup#4 cuffless track with suction and trach supplies on discharge.  He will also need outpatient SLP follow-up on discharge. Patient seen and examined at bedside, no new complaints.  Assessment & Plan:   Active Problems:   Essential hypertension, benign   History of alcohol abuse   Acute respiratory failure with hypoxia (HCC)   Glottic stenosis   Dysphagia   Acute respiratory failure with hypoxia secondary to glottic stenosis Underwent emergent tracheostomy on 11/16/2019 by ENT Currently stable on tracheostomy collar on room air.   Secretions have improved.  Recommend pulmonary toilet and using ATC and pulse OX.  Pulmonary recommendations on discharge. Home Trach / Discharge Needs: -DME ordered for discharge will need to include: 6. Shiley cuffless trach with a backup #4 cuffless trach, suction, trach supplies to include gauze and cleaning supplies as well as an additional PMV. He will also need outpatient SLP follow-up. And patient can follow-up in the trach clinic with [name], ACNP [phone]. Currently waiting for 30 days to complete post tracheostomy so he can be changed to cuffless.  Patient reports he is able to talk and take regular food by mouth and is looking forward to going home.  History of pulmonary embolism Continue with Eliquis  Patient currently denies any chest pain or shortness of breath.    Severe oropharyngeal dysphagia S/p PEG placement by general surgery on 11/16/2019.  SLP evaluation regular diet with  think liquids. Repeat SLP evaluation post discharge.   Dietary consulted and recommended nocturnal feeds at this time so we can improve overall nutrition during the day. SLP evaluation recommending regular diet with thin liquids. No new complaints at this time.   Essential hypertension Well-controlled blood pressure parameters   Alcohol abuse No signs of withdrawal.     Chronic pancreatitis Stable and asymptomatic  Anemia of chronic disease Hemoglobin stable around 13.  No new labs  DVT prophylaxis: ELIQUIS.  Code Status: full code.  Family Communication: NONE Disposition:   Status is: Inpatient  Remains inpatient appropriate because:Unsafe d/c plan   Dispo:  Patient From: Home  Planned Disposition: Skilled Nursing Facility  Expected discharge date: 12/10/19  Medically stable for discharge: Yes        Consultants:   PCCM     Procedures: AWAKE TRACHEOSTOMY (11/09/2019)  EGD AND PEG TUBE PLACEMENT (11/16/2019)   Antimicrobials: none   Subjective: No new complaints   Objective: Vitals:   12/03/19 2214 12/04/19 0741 12/04/19 0752 12/04/19 1124  BP: 109/76 111/74    Pulse: 93 84 92 73  Resp: 20 19 18 16   Temp: 98.5 F (36.9 C) 98.2 F (36.8 C)    TempSrc: Oral     SpO2: 97% 99% 98% 98%  Weight:      Height:       No intake or output data in the 24 hours ending 12/04/19 1333 Filed Weights   11/19/19 0300 11/20/19 0353 11/21/19 0300  Weight: 73.7 kg 73.7 kg 73.7 kg    Examination:  General exam: Alert and comfortable on trach collar.  Respiratory system: Clear  to auscultation bilaterally, no wheezing or rhonchi no tachypnea Cardiovascular system: S1-S2 heard, regular rate rhythm, no JVD, no pedal edema Gastrointestinal system: Abdomen is soft, nontender, nondistended, bowel sounds normal Central nervous system: Alert and oriented, grossly nonfocal Extremities: No pedal edema Skin: No rashes seen Psychiatry: Mood is  appropriate    Data Reviewed: I have personally reviewed following labs and imaging studies  CBC: No results for input(s): WBC, NEUTROABS, HGB, HCT, MCV, PLT in the last 168 hours.  Basic Metabolic Panel: No results for input(s): NA, K, CL, CO2, GLUCOSE, BUN, CREATININE, CALCIUM, MG, PHOS in the last 168 hours.  GFR: Estimated Creatinine Clearance: 101.1 mL/min (by C-G formula based on SCr of 0.76 mg/dL).  Liver Function Tests: No results for input(s): AST, ALT, ALKPHOS, BILITOT, PROT, ALBUMIN in the last 168 hours.  CBG: Recent Labs  Lab 12/01/19 0742 12/01/19 1137 12/01/19 1613 12/01/19 1911 12/02/19 0741  GLUCAP 106* 98 87 142* 82     No results found for this or any previous visit (from the past 240 hour(s)).       Radiology Studies: No results found.      Scheduled Meds: . apixaban  5 mg Per Tube BID  . feeding supplement (ENSURE ENLIVE)  237 mL Oral BID BM  . feeding supplement (OSMOLITE 1.5 CAL)  720 mL Per Tube Q24H  . feeding supplement (PROSource TF)  45 mL Per Tube BID  . free water  200 mL Per Tube Q6H  . multivitamin  15 mL Per Tube Daily  . sennosides  5 mL Per Tube QHS  . sodium chloride flush  3 mL Intravenous Q12H  . thiamine  100 mg Per Tube Daily   Continuous Infusions:   LOS: 25 days     Kathlen Mody, MD [facility]   To contact the attending provider between 7A-7P or the covering provider during after hours 7P-7A, please log into the web site [email or web address] and access using universal [place] password for that web site. If you do not have the password, please call the hospital operator.  12/04/2019, 1:33 PM

## 2019-12-05 NOTE — Progress Notes (Signed)
NAME:  Marco Pittman, MRN:  [id number], DOB:  01/14/1961, LOS: 26 ADMISSION DATE:  11/09/2019, CONSULTATION DATE:  11/09/2019 REFERRING MD:  Dr. Pilar Plate, CHIEF COMPLAINT:  Subglottic stenosis    Brief History   59yo male with a PMH significant for hypertension, pancreatitis, ETOH abuse, aneurysm, angioedema, and PE who presented 8/4 with chief complaint of shortness of breath. Per patient the shortness of breath coupled with stridor began 2 days prior to admission and has progressively worsened.   On arrival patient was seen in respiratory distress with tachypnea and stridor. Initial labwork was unremarkable. Given presentation ENT was consulted in ED and preformed bedside fiberoptic exam that revealed imobile vocal cords with only slit glottic space for breathing therefore decision was made to take patient for emergent awake tracheostomy per ENT.   Of note patient has underwent multiple intubations and extubations as follows: Admitted for angioedema requiring intubation 7/15-7/18, discharged 7/21 Readmitted 7/26 with SOB 2/2 aspiration PNA and PE intubated 7/26-7/27 Developed upper airway wheeze and required reintubation 7/27-7/29   Past Medical History  ETOH abuse Pancreatitis Hypertension PE Angioedema   Significant Hospital Events   8/04 Admit, underwent emergent awake trach for sub glottic stenosis  8/05 ATC 35%, SLP eval pending  8/09 MBS > NPO, Ice chips PRN   Consults:  ENT  Procedures:  Trach (by ENT) 8/4 >>  PEG 8/11 >>  Significant Diagnostic Tests:  DG soft neck 8/4 > negative   Micro Data:  COVID 8/4 >> negative   Antimicrobials:     Interim history/subjective:  Anxious to leave hospital.  Objective   Blood pressure 125/87, pulse 92, temperature 98 F (36.7 C), resp. rate 20, height 6\' 1"  (1.854 m), weight 73.7 kg, SpO2 100 %.       No intake or output data in the 24 hours ending 12/05/19 1358 Filed Weights   11/19/19 0300 11/20/19 0353 11/21/19 0300   Weight: 73.7 kg 73.7 kg 73.7 kg   Physical Exam:  General - alert Eyes - pupils reactive ENT - trach site clean Cardiac - regular rate/rhythm, no murmur Chest - equal breath sounds b/l, no wheezing or rales Abdomen - soft, non tender, + bowel sounds Extremities - no cyanosis, clubbing, or edema Skin - no rashes Neuro - normal strength, moves extremities, follows commands Psych - normal mood and behavior   Assessment & Plan:   Acute respiratory failure with hypoxia/hypercapnic from stridor 2nd to subglottic stenosis. S/p tracheostomy by ENT on 11/07/19. - no longer needing supplemental oxygen - tolerating PM valve - DME order for discharge: #6 Shiley cuffless trach with backup #4 Shiley cuffless trach, suction, trach supplies to include gauze and cleaning supplies as well as additional PMV - will need to arrange for f/u with 01/07/20 in trach clinic [phone])  Pulmonary embolism. - anticoagulation per primary team   Labs    CMP Latest Ref Rng & Units 11/26/2019 11/18/2019 11/17/2019  Glucose 70 - 99 mg/dL 01/17/2020) 811(X) 726(O)  BUN 8 - 23 mg/dL 15 9 10   Creatinine 0.61 - 1.24 mg/dL 035(D 9.74  Sodium 135 - 145 mmol/L 136 137 137  Potassium 3.5 - 5.1 mmol/L 4.6 4.0 4.6  Chloride 98 - 111 mmol/L 101 103 107  CO2 22 - 32 mmol/L 23 24 21(L)  Calcium 8.9 - 10.3 mg/dL 9.1 1.63) 8.45)  Total Protein 6.5 - 8.1 g/dL - - -  Total Bilirubin 0.3 - 1.2 mg/dL - - -  Alkaline Phos 38 -  126 U/L - - -  AST 15 - 41 U/L - - -  ALT 0 - 44 U/L - - -    CBC Latest Ref Rng & Units 11/26/2019 11/21/2019 11/17/2019  WBC 4.0 - 10.5 K/uL 7.4 6.5 7.2  Hemoglobin 13.0 - 17.0 g/dL 15.4 12.3(L) 11.7(L)  Hematocrit 39 - 52 % 38.6(L) 35.5(L) 33.4(L)  Platelets 150 - 400 K/uL 343 291 296    Signature:  Coralyn Helling, MD [facility] Pulmonary/Critical Care Pager - [phone] 12/05/2019, 2:05 PM

## 2019-12-05 NOTE — Progress Notes (Signed)
PROGRESS NOTE    Marco Pittman  NWG:[id number] DOB: Oct 25, 1960 DOA: 11/09/2019 PCP: Marcine Matar, MD    Chief Complaint  Patient presents with  . Shortness of Breath    Brief Narrative:  74 y male with history of hypertension, pancreatitis, alcohol abuse, primary biliary cirrhosis presents with shortness of breath in the setting of glottic stenosis requiring emergent tracheostomy.  PCCM recommends Shiley cuffless trach with a backup#4 cuffless track with suction and trach supplies on discharge.  He will also need outpatient SLP follow-up on discharge. Patient seen and examined at bedside, No new complaints.   Assessment & Plan:   Active Problems:   Essential hypertension, benign   History of alcohol abuse   Acute respiratory failure with hypoxia (HCC)   Glottic stenosis   Dysphagia   Acute respiratory failure with hypoxia secondary to glottic stenosis Underwent emergent tracheostomy on 11/16/2019 by ENT Currently stable on tracheostomy collar on room air.   Secretions have improved.  Recommend pulmonary toilet and using ATC and pulse OX.  Pulmonary recommendations on discharge. Home Trach / Discharge Needs: -DME ordered for discharge will need to include: 6. Shiley cuffless trach with a backup #4 cuffless trach, suction, trach supplies to include gauze and cleaning supplies as well as an additional PMV. He will also need outpatient SLP follow-up. And patient can follow-up in the trach clinic with [name], ACNP [phone]. Currently waiting for 30 days to complete post tracheostomy so he can be changed to cuffless.  Patient reports he is able to talk and take regular food by mouth and is looking forward to going home.  History of pulmonary embolism Continue with Eliquis  No chest pain or sob.     Severe oropharyngeal dysphagia S/p PEG placement by general surgery on 11/16/2019.  SLP evaluation regular diet with think liquids. Repeat SLP evaluation  post discharge.   Dietary consulted and recommended nocturnal feeds at this time so we can improve overall nutrition during the day. SLP evaluation recommending regular diet with thin liquids. No new complaints at this time.   Essential hypertension Well controlled.    Alcohol abuse No signs of withdrawal.     Chronic pancreatitis Stable and asymptomatic  Anemia of chronic disease Hemoglobin stable around 13.  No new labs  DVT prophylaxis: ELIQUIS.  Code Status: full code.  Family Communication: NONE Disposition:   Status is: Inpatient  Remains inpatient appropriate because:Unsafe d/c plan   Dispo:  Patient From: Home  Planned Disposition: Skilled Nursing Facility  Expected discharge date: 12/10/19  Medically stable for discharge: Yes        Consultants:   PCCM     Procedures: AWAKE TRACHEOSTOMY (11/09/2019)  EGD AND PEG TUBE PLACEMENT (11/16/2019)   Antimicrobials: none   Subjective: No new complaints.    Objective: Vitals:   12/05/19 0319 12/05/19 0731 12/05/19 0831 12/05/19 1119  BP:   125/87   Pulse: 86 92 94 92  Resp: 18 20 (!) 22 20  Temp:   98 F (36.7 C)   TempSrc:      SpO2: 98% 99% 100%   Weight:      Height:       No intake or output data in the 24 hours ending 12/05/19 1400 Filed Weights   11/19/19 0300 11/20/19 0353 11/21/19 0300  Weight: 73.7 kg 73.7 kg 73.7 kg    Examination:  General exam: alert and comfortable.   Respiratory system: Clear to auscultation bilaterally, no wheezing or rhonchi  Cardiovascular system: S1-S2 heard, regular rate rhythm, no JVD, no pedal edema Gastrointestinal system: Abdomen is soft, nontender nondistended Central nervous system: Alert and oriented Extremities: No pedal edema  skin: No rashes seen Psychiatry: Mood is appropriate    Data Reviewed: I have personally reviewed following labs and imaging studies  CBC: No results for input(s): WBC, NEUTROABS, HGB, HCT, MCV, PLT in the  last 168 hours.  Basic Metabolic Panel: No results for input(s): NA, K, CL, CO2, GLUCOSE, BUN, CREATININE, CALCIUM, MG, PHOS in the last 168 hours.  GFR: Estimated Creatinine Clearance: 101.1 mL/min (by C-G formula based on SCr of 0.76 mg/dL).  Liver Function Tests: No results for input(s): AST, ALT, ALKPHOS, BILITOT, PROT, ALBUMIN in the last 168 hours.  CBG: Recent Labs  Lab 12/01/19 0742 12/01/19 1137 12/01/19 1613 12/01/19 1911 12/02/19 0741  GLUCAP 106* 98 87 142* 82     No results found for this or any previous visit (from the past 240 hour(s)).       Radiology Studies: No results found.      Scheduled Meds: . apixaban  5 mg Per Tube BID  . feeding supplement (ENSURE ENLIVE)  237 mL Oral BID BM  . feeding supplement (OSMOLITE 1.5 CAL)  720 mL Per Tube Q24H  . feeding supplement (PROSource TF)  45 mL Per Tube BID  . free water  200 mL Per Tube Q6H  . multivitamin  15 mL Per Tube Daily  . sennosides  5 mL Per Tube QHS  . sodium chloride flush  3 mL Intravenous Q12H  . thiamine  100 mg Per Tube Daily   Continuous Infusions:   LOS: 26 days     Kathlen Mody, MD [facility]   To contact the attending provider between 7A-7P or the covering provider during after hours 7P-7A, please log into the web site [email or web address] and access using universal [place] password for that web site. If you do not have the password, please call the hospital operator.  12/05/2019, 2:00 PM

## 2019-12-06 MED ORDER — ENSURE ENLIVE PO LIQD
237.0000 mL | Freq: Three times a day (TID) | ORAL | Status: DC
  Administered 2019-12-06 – 2019-12-10 (×13): 237 mL via ORAL

## 2019-12-06 NOTE — TOC Progression Note (Addendum)
Transition of Care [facility]) - Progression Note    Patient Details  Name: Marco Pittman MRN: [id number] Date of Birth: [birth year]  [facility] CM/SW Contact  Lorri Frederick, LCSW Phone Number: 12/06/2019, 12:42 PM  Clinical Narrative:   Pt has been released by PT with no follow up recommendations.  Velna Hatchet from [facility] gave update that her facility is closed to admissions indefinitely right now.  CSW spoke with sister, [name], who is waiting on verification that pt apartment will be available tomorrow.  CSW spoke with Burman Nieves of [facility]--the barrier with his getting RN [facility] for this pt is that medicaid requires a live in caregiver to be with pt during the time that the RN will not be there.  Molly Maduro spoke to both the sister and the friend, Deniece Portela, and neither are able to move in with pt.  CSW also spoke with Brett Canales, covering respiratory for 2W today and he can arrange for respiratory to meet with pt tomorrow to do some final teaching and make sure pt is capable of managing the trach at home.    Expected Discharge Plan: Home w Home Health Services Barriers to Discharge: Continued Medical Work up, English as a second language teacher, Equipment Delay  Expected Discharge Plan and Services Expected Discharge Plan: Home w Home Health Services     Post Acute Care Choice: Home Health Living arrangements for the past 2 months: Single Family Home Expected Discharge Date: 11/17/19                                     Social Determinants of Health (SDOH) Interventions    Readmission Risk Interventions No flowsheet data found.

## 2019-12-06 NOTE — Progress Notes (Signed)
Nutrition Follow-up  DOCUMENTATION CODES:   Not applicable  INTERVENTION:  Ensure Enlive po TID, each supplement provides 350 kcal and 20 grams of protein  D/c nocturnal tube feeding regimen (Osmolite 1.5, Prosource TF) due to pt refusing administration   NUTRITION DIAGNOSIS:   Inadequate oral intake related to inability to eat as evidenced by NPO status. -- Progressing, pt now on regular diet  GOAL:   Patient will meet greater than or equal to 90% of their needs -- Progressing  MONITOR:   Diet advancement, TF tolerance, Skin, Weight trends, Labs, I & O's  REASON FOR ASSESSMENT:   Consult Enteral/tube feeding initiation and management  ASSESSMENT:   59yo male who presents with chief complaint of shortness of breath, on evaluation was found to have subglottic stenosis. Pt underwent emergent awake tracheostomy 8/4.  8/6 Cortrak placed (gastric) 8/9 failed MBS, Cortrak removed 8/11 s/p EGD and PEG placement 8/18 MBS, dysphagia 1 with pudding thick liquids ordered 8/23 diet advanced to dysphagia 2 with thin liquids 8/25 diet advanced to dysphagia 3 with thin liquids 8/27 diet advanced to regular  Pt is medically stable for discharge per MD; waiting on 30 days post-trach for d/c. Pt is anxious to leave the hospital.  Per RN, pt is accepting Ensure Enlive. (receiving BID). However, pt is noted to be refusing nocturnal TF. Current orders are for Osmolite 1.5 cal @ 81ml/hr for 12 hours, 30ml Prosource TF BID, and free water Q6H. Discussed with MD. Will d/c TF orders given pt is refusing and eating well.   PO Intake: 80-100% x 3 recorded meals  Labs reviewed. Medications: MVI, Senokot, Thiamine  Diet Order:   Diet Order            Diet regular Room service appropriate? No; Fluid consistency: Thin  Diet effective now                 EDUCATION NEEDS:   Not appropriate for education at this time  Skin:  Skin Assessment: Skin Integrity Issues: Skin Integrity  Issues:: Incisions Incisions: neck, abdomen  Last BM:  8/30  Height:   Ht Readings from Last 1 Encounters:  11/10/19 6\' 1"  (1.854 m)    Weight:   Wt Readings from Last 1 Encounters:  11/21/19 73.7 kg    BMI:  Body mass index is 21.44 kg/m.  Estimated Nutritional Needs:   Kcal:  2200-2400  Protein:  110-120 grams  Fluid:  >/= 2 L/day    11/23/19, MS, RD, LDN RD pager number and weekend/on-call pager number located in Amion.

## 2019-12-06 NOTE — Progress Notes (Signed)
  Speech Language Pathology Treatment: [name] Speaking valve  Patient Details Name: Marco Pittman MRN: [id number] DOB: Jan 25, 1961 Today's Date: 12/06/2019 Time: 8502-7741 SLP Time Calculation (min) (ACUTE ONLY): 14 min  Assessment / Plan / Recommendation Clinical Impression  Skilled treatment provided with PMV. Air mobilization was obstructed today and only able to keep valve on for 5-10 seconds before popping off of pt needing to doff. He reports when he becomes excited respirations increase and becomes short of breath. Pt expectorated mild-moderate size mucous via trach contributing to decreased tolerance today however short duration even after clearing majority of phlegm. Throughout session he was doffed and donned valve independently. He stated being told the trach would come out before he leaves on Saturday but no documentation found in chart to support that. At end of session he stated he thinks he will be discharged with trach. Did educate process whenever he is decannulated in relation to stoma and phonation. He has been told about the trach clinic. Plan to see once more prior to d/c to answer questions.    HPI HPI: 59 yo male smoker presented to ER 7/26 with worsening shortness of breath and chest discomfort. Found to have severe hypertension with SBP > 200.  CT chest showed b/l PE and RLL ASD with mucus plugging concerning for aspiration pneumonia. PMH: ETOH, HTN, Pancreatitis, Aneurysm. ETT 7/26-27; reintubated 7/27-29 due to angioedema. 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 Seen 11/03/19 post extubation with regular and nectar thick recommended and upgraded to thin liquids the following day.    Pt now with severe dysphagia noted on MBS testing x2.  His trach is now changed to a number 6 cuffless and thus he is able to move air around his trach and  speak for a few seconds before breathstacking noted. MBS 8/18 recommended puree and pudding thick liquids.       SLP Plan  Continue with current plan of care       Recommendations         Patient may use Passy-Muir Speech Valve: During all waking hours (remove during sleep) PMSV Supervision: Intermittent         Oral Care Recommendations: Oral care BID Follow up Recommendations: Other (comment) (rec f/u trach clinic) SLP Visit Diagnosis: Aphonia (R49.1) Plan: Continue with current plan of care                       [name] 12/06/2019, 9:33 AM  [name].Ed Nurse, children's [phone] Office [phone]

## 2019-12-06 NOTE — Plan of Care (Signed)

## 2019-12-06 NOTE — Progress Notes (Signed)
PROGRESS NOTE    Marco Pittman  WCH:[id number] DOB: 06/16/1960 DOA: 11/09/2019 PCP: Marcine Matar, MD    Chief Complaint  Patient presents with  . Shortness of Breath    Brief Narrative:  57 y male with history of hypertension, pancreatitis, alcohol abuse, primary biliary cirrhosis presents with shortness of breath in the setting of glottic stenosis requiring emergent tracheostomy.  PCCM recommends Shiley cuffless trach with a backup#4 cuffless track with suction and trach supplies on discharge.  He will also need outpatient SLP follow-up on discharge.pt seen and examined at bedside. No new complaints over the last 3 days. Pt wants to know when the G tube can   Assessment & Plan:   Active Problems:   Essential hypertension, benign   History of alcohol abuse   Acute respiratory failure with hypoxia (HCC)   Glottic stenosis   Dysphagia   Acute respiratory failure with hypoxia secondary to glottic stenosis Underwent emergent tracheostomy on 11/16/2019 by ENT Currently stable on tracheostomy collar on room air.   Secretions have improved.  Recommend pulmonary toilet and using ATC and pulse OX.  Pulmonary recommendations on discharge. Home Trach / Discharge Needs: -DME ordered for discharge will need to include: 6. Shiley cuffless trach with a backup #4 cuffless trach, suction, trach supplies to include gauze and cleaning supplies as well as an additional PMV. He will also need outpatient SLP follow-up. And patient can follow-up in the trach clinic with [name], ACNP [phone]. Currently waiting for 30 days to complete post tracheostomy so he can be changed to cuffless.  Patient reports he is able to talk and take regular food by mouth and is looking forward to being discharged    History of pulmonary embolism Continue with Eliquis , No chest pain or sob.     Severe oropharyngeal dysphagia S/p PEG placement by general surgery on 11/16/2019.  SLP  evaluation regular diet with think liquids. Repeat SLP evaluation post discharge.   Dietary consulted and recommended nocturnal feeds at this time so we can improve overall nutrition during the day. SLP evaluation recommending regular diet with thin liquids. No new complaints at this time.   Essential hypertension Well-controlled   Alcohol abuse No signs of withdrawal.     Chronic pancreatitis Stable and asymptomatic  Anemia of chronic disease Hemoglobin stable around 13.  No new labs  DVT prophylaxis: ELIQUIS.  Code Status: full code.  Family Communication: NONE Disposition:   Status is: Inpatient  Remains inpatient appropriate because:Unsafe d/c plan   Dispo:  Patient From:  home.   Planned Disposition:  SNF.   Expected discharge date: 12/10/19  Medically stable for discharge:  YES        Consultants:   PCCM     Procedures: AWAKE TRACHEOSTOMY (11/09/2019)  EGD AND PEG TUBE PLACEMENT (11/16/2019)   Antimicrobials: none   Subjective: No new complaints   Objective: Vitals:   12/05/19 2100 12/05/19 2305 12/06/19 0310 12/06/19 0815  BP: (!) 117/94   108/82  Pulse: 91 93 89 92  Resp: 20 18 18 20   Temp: 98.9 F (37.2 C)   98.2 F (36.8 C)  TempSrc: Oral   Oral  SpO2: 99% 99% 100% 98%  Weight:      Height:        Intake/Output Summary (Last 24 hours) at 12/06/2019 1510 Last data filed at 12/06/2019 1114 Gross per 24 hour  Intake 990 ml  Output --  Net 990 ml   12/08/2019  11/19/19 0300 11/20/19 0353 11/21/19 0300  Weight: 73.7 kg 73.7 kg 73.7 kg    Examination:  General exam: Alert and comfortable, not in distress  Respiratory system: Clear to auscultation bilaterally, no wheezing or rhonchi Cardiovascular system: S1-S2 heard, regular rate rhythm, no JVD, no pedal edema. Gastrointestinal system: Abdomen is soft, nontender, nondistended, bowel sounds normal. Central nervous system: Alert and oriented, grossly nonfocal Extremities:  No pedal edema. skin: No rashes seen Psychiatry: Mood is appropriate    Data Reviewed: I have personally reviewed following labs and imaging studies  CBC: No results for input(s): WBC, NEUTROABS, HGB, HCT, MCV, PLT in the last 168 hours.  Basic Metabolic Panel: No results for input(s): NA, K, CL, CO2, GLUCOSE, BUN, CREATININE, CALCIUM, MG, PHOS in the last 168 hours.  GFR: Estimated Creatinine Clearance: 101.1 mL/min (by C-G formula based on SCr of 0.76 mg/dL).  Liver Function Tests: No results for input(s): AST, ALT, ALKPHOS, BILITOT, PROT, ALBUMIN in the last 168 hours.  CBG: Recent Labs  Lab 12/01/19 0742 12/01/19 1137 12/01/19 1613 12/01/19 1911 12/02/19 0741  GLUCAP 106* 98 87 142* 82     No results found for this or any previous visit (from the past 240 hour(s)).       Radiology Studies: No results found.      Scheduled Meds: . apixaban  5 mg Per Tube BID  . feeding supplement (ENSURE ENLIVE)  237 mL Oral TID BM  . free water  200 mL Per Tube Q6H  . multivitamin  15 mL Per Tube Daily  . sennosides  5 mL Per Tube QHS  . sodium chloride flush  3 mL Intravenous Q12H  . thiamine  100 mg Per Tube Daily   Continuous Infusions:   LOS: 27 days     Kathlen Mody, MD [facility]   To contact the attending provider between 7A-7P or the covering provider during after hours 7P-7A, please log into the web site [email or web address] and access using universal [place] password for that web site. If you do not have the password, please call the hospital operator.  12/06/2019, 3:10 PM

## 2019-12-07 DIAGNOSIS — F1011 Alcohol abuse, in remission: Secondary | ICD-10-CM

## 2019-12-07 NOTE — TOC Progression Note (Addendum)
Transition of Care [facility]) - Progression Note    Patient Details  Name: Marco Pittman MRN: [id number] Date of Birth: 1960-05-19  Transition of [facility]) CM/SW Contact  Lorri Frederick, LCSW Phone Number: 12/07/2019, 9:35 AM  Clinical Narrative:  CSW obtained pager number for [facility] clinic. Cindee Lame directed CSW to [employer] through secure chat to schedule appt 4 weeks out.  CSW also spoke with pt sister, [name], who reports the family will not be renting the apartment because pt's mother has agreed to have pt live with her.  She is currently in one bedroom apartment but they are meeting with their realtor about a 3 bedroom home that is available immediately.  [name] will update tomorrow as meeting is 6pm tonight.   1200: appt received at trach clinic, 12/29/19, 1pm with [name].  Pt required to have negative covid test prior to this appt and this will be scheduled at [facility].  SLP now recommending only follow up at trach clinic as well.     Expected Discharge Plan: Home w Home Health Services Barriers to Discharge: Continued Medical Work up, English as a second language teacher, Equipment Delay  Expected Discharge Plan and Services Expected Discharge Plan: Home w Home Health Services     Post Acute Care Choice: Home Health Living arrangements for the past 2 months: Single Family Home Expected Discharge Date: 11/17/19                                     Social Determinants of Health (SDOH) Interventions    Readmission Risk Interventions No flowsheet data found.

## 2019-12-07 NOTE — Progress Notes (Signed)
[facility]  PROGRESS NOTE  Marco Pittman YKD:[id number] DOB: 06/16/1960 DOA: 11/09/2019 PCP: Marcine Matar, MD   Brief HPI:   59 year old male with history of hypertension, pancreatitis, alcohol abuse, primary biliary cirrhosis presented with shortness of breath in setting of glottic stenosis requiring emergent tracheostomy.  PCCM recommended Shiley cuffless trach with backup #4 Shiley cuffless trach suction and trach supplies including gauze and Kling supplies as well as additional PMV on discharge.  Patient will follow up with trach clinic, [name] at the time of discharge    Subjective   Patient seen and examined, denies any complaints..  Denies shortness of breath or chest pain.   Assessment/Plan:     Acute hypoxemic respiratory failure-secondary to glottic stenosis.  Underwent emergent tracheostomy on 1121 by ENT.  Stable on tracheostomy collar on room air.  Secretions are improving. Home Trach / Discharge Needs: -DME ordered for discharge will need to include: 6. Shiley cuffless trach with a backup #4 cuffless trach, suction, trach supplies to include gauze and cleaning supplies as well as an additional PMV. He will also need outpatient SLP follow-up. And patient can follow-up in the trach clinic with [name], ACNP [phone].   Currently waiting for 30 days to complete post tracheostomy so he can be changed to cuffless.  History of bone embolism-continue Eliquis  Severe oropharyngeal dysphagia-s/p PEG tube placement by general surgery on 1121.  SLP evaluation done recommend regular diet with thin liquids.  Will need repeat swallow evaluation post discharge.  Dietary recommended nocturnal feeds to improve overall nutrition during the day.  Hypertension-blood pressure is controlled.  History of alcohol abuse-no signs of alcohol withdrawal.  Chronic pancreatitis-patient stable and asymptomatic.       COVID-19 Labs  No results for  input(s): DDIMER, FERRITIN, LDH, CRP in the last 72 hours.  Lab Results  Component Value Date   SARSCOV2NAA NEGATIVE 11/09/2019   SARSCOV2NAA NEGATIVE 10/30/2019   SARSCOV2NAA NEGATIVE 10/20/2019     Scheduled medications:   . apixaban  5 mg Per Tube BID  . feeding supplement (ENSURE ENLIVE)  237 mL Oral TID BM  . multivitamin  15 mL Per Tube Daily  . sennosides  5 mL Per Tube QHS  . sodium chloride flush  3 mL Intravenous Q12H  . thiamine  100 mg Per Tube Daily         CBG: Recent Labs  Lab 12/01/19 0742 12/01/19 1137 12/01/19 1613 12/01/19 1911 12/02/19 0741  GLUCAP 106* 98 87 142* 82    SpO2: 98 % O2 Flow Rate (L/min): 5 L/min FiO2 (%): 21 %     Antibiotics: Anti-infectives (From admission, onward)   None       DVT prophylaxis: Apixaban  Code Status: Full code  Family Communication: No family at bedside    Status is: Inpatient  Dispo: The patient is from: Home              Anticipated d/c is to: Skilled nursing facility              Anticipated d/c date is: 12/10/2019              Patient currently medically stable for discharge  Barrier to discharge-awaiting bed at skilled nursing facility      Consultants:  PCCM  Procedures:  Tracheostomy on 11/09/2019, EGD and PEG tube placement 11/16/2019   Objective   Vitals:   12/07/19 0727 12/07/19 0735 12/07/19 1135 12/07/19 1225  BP: 124/90  Pulse: 94 92 (!) 104 98  Resp: 16 18 16 18   Temp: 99 F (37.2 C)     TempSrc:      SpO2: 97% 93% 98% 98%  Weight:      Height:        Intake/Output Summary (Last 24 hours) at 12/07/2019 1347 Last data filed at 12/07/2019 0730 Gross per 24 hour  Intake 849 ml  Output 200 ml  Net 649 ml    08/30 1901 - 09/01 0700 In: 1602 [P.O.:1602] Out: 200 [Urine:200]  Filed Weights   11/19/19 0300 11/20/19 0353 11/21/19 0300  Weight: 73.7 kg 73.7 kg 73.7 kg    Physical Examination:    General: Appears in no acute distress, trach collar in  place  Cardiovascular: S1-S2, regular, no murmur auscultated  Respiratory: Clear to auscultation bilaterally  Abdomen: Abdomen is soft, nontender, no organomegaly  Extremities: No edema in the lower extremities  Neurologic: Alert, oriented x3, intact insight and judgment    Data Reviewed:   No results found for this or any previous visit (from the past 240 hour(s)).  No results for input(s): LIPASE, AMYLASE in the last 168 hours. No results for input(s): AMMONIA in the last 168 hours.  Cardiac Enzymes: No results for input(s): CKTOTAL, CKMB, CKMBINDEX, TROPONINI in the last 168 hours. BNP (last 3 results) Recent Labs    10/30/19 2126  BNP 91.7    ProBNP (last 3 results) No results for input(s): PROBNP in the last 8760 hours.  Studies:  No results found.     2127   [facility] If 7PM-7AM, please contact night-coverage at [email or web address], Office  [phone]   12/07/2019, 1:47 PM  LOS: 28 days

## 2019-12-07 NOTE — Procedures (Signed)
Tracheostomy Change Note  Patient Details:   Name: Marco Pittman DOB: 1960-06-26 MRN: [id number]    Airway Documentation:     Evaluation  O2 sats: stable throughout Complications: No apparent complications Patient did tolerate procedure well. Bilateral Breath Sounds: Diminished   Patient's trach was changed to a #6 cuffless shiley without difficulty. Positive color change on CO2 detector. Janina Mayo was secured with trach ties.     Carlynn Spry 12/07/2019, 12:25 PM

## 2019-12-07 NOTE — Progress Notes (Signed)
In with patient's good friend [place].  Reviewed how to clean trach site and clean inner cannula today.

## 2019-12-08 NOTE — TOC Progression Note (Addendum)
Transition of Care [facility]) - Progression Note    Patient Details  Name: GERSHOM BROBECK MRN: [id number] Date of Birth: February 07, 1961  [facility]) CM/SW Contact  Lorri Frederick, LCSW Phone Number: 12/08/2019, 3:39 PM  Clinical Narrative:   CSW spoke with pt sister and she confirmed that pt does not have a legal guardian.  Pt has denied this as well.  Flag deactivated.  Sister reports that they will now not be able to take possession of the new house until next Thursday, Sept 10. They do not have other housing options for pt until then.   CSW also staffed case with [facility] director York Ram, who agrees pt needs stable housing discharge with his trach.  He suggested contacted Lyndle Herrlich with [employer] as possible bridge to family getting the suitable housing.  Emailed [facility] L[facility]ick but they are not able to take pt.     Expected Discharge Plan: Home w Home Health Services Barriers to Discharge: Continued Medical Work up, English as a second language teacher, Equipment Delay  Expected Discharge Plan and Services Expected Discharge Plan: Home w Home Health Services     Post Acute Care Choice: Home Health Living arrangements for the past 2 months: Single Family Home Expected Discharge Date: 11/17/19                                     Social Determinants of Health (SDOH) Interventions    Readmission Risk Interventions No flowsheet data found.

## 2019-12-08 NOTE — Progress Notes (Signed)
[facility]  PROGRESS NOTE  Marco Pittman FYB:[id number] DOB: 1960/11/07 DOA: 11/09/2019 PCP: Marcine Matar, MD   Brief HPI:   59 year old male with history of hypertension, pancreatitis, alcohol abuse, primary biliary cirrhosis presented with shortness of breath in setting of glottic stenosis requiring emergent tracheostomy.  PCCM recommended Shiley cuffless trach with backup #4 Shiley cuffless trach suction and trach supplies including gauze and Kling supplies as well as additional PMV on discharge.  Patient will follow up with trach clinic, [name] at the time of discharge    Subjective   Patient seen and examined, no new complaints.   Assessment/Plan:     Acute hypoxemic respiratory failure-secondary to glottic stenosis.  Underwent emergent tracheostomy on 1121 by ENT.  Stable on tracheostomy collar on room air.  Secretions are improving. Home Trach / Discharge Needs: -DME ordered for discharge will need to include: 6. Shiley cuffless trach with a backup #4 cuffless trach, suction, trach supplies to include gauze and cleaning supplies as well as an additional PMV. He will also need outpatient SLP follow-up. And patient can follow-up in the trach clinic with [name], ACNP [phone].   Currently waiting for 30 days to complete post tracheostomy so he can be changed to cuffless.  History of bone embolism-continue Eliquis  Severe oropharyngeal dysphagia-s/p PEG tube placement by general surgery on 11/16/19.  SLP evaluation done recommend regular diet with thin liquids.  Will need repeat swallow evaluation post discharge.  Dietary recommended nocturnal feeds to improve overall nutrition during the day.  Hypertension-blood pressure is controlled.  History of alcohol abuse-no signs of alcohol withdrawal.  Chronic pancreatitis-patient stable and asymptomatic.       COVID-19 Labs  No results for input(s): DDIMER, FERRITIN, LDH, CRP in the last 72  hours.  Lab Results  Component Value Date   SARSCOV2NAA NEGATIVE 11/09/2019   SARSCOV2NAA NEGATIVE 10/30/2019   SARSCOV2NAA NEGATIVE 10/20/2019     Scheduled medications:   . apixaban  5 mg Per Tube BID  . feeding supplement (ENSURE ENLIVE)  237 mL Oral TID BM  . multivitamin  15 mL Per Tube Daily  . sennosides  5 mL Per Tube QHS  . sodium chloride flush  3 mL Intravenous Q12H  . thiamine  100 mg Per Tube Daily         CBG: Recent Labs  Lab 12/01/19 1613 12/01/19 1911 12/02/19 0741  GLUCAP 87 142* 82    SpO2: 98 % O2 Flow Rate (L/min): 5 L/min FiO2 (%): 21 %     Antibiotics: Anti-infectives (From admission, onward)   None       DVT prophylaxis: Apixaban  Code Status: Full code  Family Communication: No family at bedside    Status is: Inpatient  Dispo: The patient is from: Home              Anticipated d/c is to: Home              Anticipated d/c date is: 12/10/2019              Patient currently medically stable for discharge  Barrier to discharge-no safe discharge, patient family is trying to arrange for place to stay.      Consultants:  PCCM  Procedures:  Tracheostomy on 11/09/2019, EGD and PEG tube placement 11/16/2019   Objective   Vitals:   12/08/19 0352 12/08/19 0737 12/08/19 0741 12/08/19 1110  BP:  (!) 114/93    Pulse: 84 86 84 87  Resp: 18 20 18 16   Temp:  98.8 F (37.1 C)    TempSrc:      SpO2: 94% 99% 95% 98%  Weight:      Height:       No intake or output data in the 24 hours ending 12/08/19 1230  08/31 1901 - 09/02 0700 In: 474 [P.O.:474] Out: 200 [Urine:200]  Filed Weights   11/19/19 0300 11/20/19 0353 11/21/19 0300  Weight: 73.7 kg 73.7 kg 73.7 kg    Physical Examination:    General-appears in no acute distress, trach collar in place  Heart-S1-S2, regular, no murmur auscultated  Lungs-clear to auscultation bilaterally, no wheezing or crackles auscultated  Abdomen-soft, nontender, no  organomegaly  Extremities-no edema in the lower extremities  Neuro-alert, oriented x3, no focal deficit noted    Data Reviewed:   No results found for this or any previous visit (from the past 240 hour(s)).  No results for input(s): LIPASE, AMYLASE in the last 168 hours. No results for input(s): AMMONIA in the last 168 hours.  Cardiac Enzymes: No results for input(s): CKTOTAL, CKMB, CKMBINDEX, TROPONINI in the last 168 hours. BNP (last 3 results) Recent Labs    10/30/19 2126  BNP 91.7       Rogers Ditter S Nickole Adamek   [facility] If 7PM-7AM, please contact night-coverage at [email or web address], Office  [phone]   12/08/2019, 12:30 PM  LOS: 29 days

## 2019-12-09 NOTE — Plan of Care (Signed)
Discussed with patient plan of care for the evening, pain management and health behaviors with some teach back displayed

## 2019-12-09 NOTE — Progress Notes (Signed)
  Speech Language Pathology Treatment: Marco Pittman Speaking valve  Patient Details Name: Marco Pittman MRN: [id number] DOB: 06-Oct-1960 Today's Date: 12/09/2019 Time: 4098-1191 SLP Time Calculation (min) (ACUTE ONLY): 8 min  Assessment / Plan / Recommendation Clinical Impression  Pt demonstrates independence with PMSV. Pt observed placing and removing pMSV for speech (hoarse, dysphonic). Pt able to explain cleaning and verbalize removal for sleep. No further SLP interventions at this time unless f/u needed for voice as an OP in future after ENT referral as needed.   HPI HPI: 59 yo male smoker presented to ER 7/26 with worsening shortness of breath and chest discomfort. Found to have severe hypertension with SBP > 200.  CT chest showed b/l PE and RLL ASD with mucus plugging concerning for aspiration pneumonia. PMH: ETOH, HTN, Pancreatitis, Aneurysm. ETT 7/26-27; reintubated 7/27-29 due to angioedema. 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 Seen 11/03/19 post extubation with regular and nectar thick recommended and upgraded to thin liquids the following day.    Pt now with severe dysphagia noted on MBS testing x2.  His trach is now changed to a number 6 cuffless and thus he is able to move air around his trach and speak for a few seconds before breathstacking noted. MBS 8/18 recommended puree and pudding thick liquids.       SLP Plan          Recommendations         Patient may use Passy-Muir Speech Valve: During all waking hours (remove during sleep) PMSV Supervision: Intermittent         Oral Care Recommendations: Oral care BID SLP Visit Diagnosis: Aphonia (R49.1)       GO               Marco Ditty, MA CCC-SLP  Acute Rehabilitation Services Pager [phone] Office [phone]  Marco Pittman 12/09/2019, 2:02 PM

## 2019-12-09 NOTE — Plan of Care (Signed)
Discussed with patient plan of care for the evening, pain management and supplements with some teach back displayed

## 2019-12-09 NOTE — Progress Notes (Signed)
[facility]  PROGRESS NOTE  Marco Pittman SAY:[id number] DOB: [birth year] DOA: 11/09/2019 PCP: Marcine Matar, MD   Brief HPI:   59 year old male with history of hypertension, pancreatitis, alcohol abuse, primary biliary cirrhosis presented with shortness of breath in setting of glottic stenosis requiring emergent tracheostomy.  PCCM recommended Shiley cuffless trach with backup #4 Shiley cuffless trach suction and trach supplies including gauze and Kling supplies as well as additional PMV on discharge.  Patient will follow up with trach clinic, [name] at the time of discharge    Subjective   Patient seen and examined, denies any new complaints.   Assessment/Plan:     Acute hypoxemic respiratory failure-secondary to glottic stenosis.  Underwent emergent tracheostomy on 1121 by ENT.  Stable on tracheostomy collar on room air.  Secretions are improving. Home Trach / Discharge Needs: -DME ordered for discharge will need to include: 6. Shiley cuffless trach with a backup #4 cuffless trach, suction, trach supplies to include gauze and cleaning supplies as well as an additional PMV. He will also need outpatient SLP follow-up. And patient can follow-up in the trach clinic with [name], ACNP [phone].   Currently waiting for 30 days to complete post tracheostomy so he can be changed to cuffless.  History of bone embolism-continue Eliquis  Severe oropharyngeal dysphagia-s/p PEG tube placement by general surgery on 11/16/19.  SLP evaluation done recommend regular diet with thin liquids.  Will need repeat swallow evaluation post discharge.  Dietary recommended nocturnal feeds to improve overall nutrition during the day.  Hypertension-blood pressure is controlled.  History of alcohol abuse-no signs of alcohol withdrawal.  Chronic pancreatitis-patient stable and asymptomatic.       COVID-19 Labs  No results for input(s): DDIMER, FERRITIN, LDH, CRP in the  last 72 hours.  Lab Results  Component Value Date   SARSCOV2NAA NEGATIVE 11/09/2019   SARSCOV2NAA NEGATIVE 10/30/2019   SARSCOV2NAA NEGATIVE 10/20/2019     Scheduled medications:    apixaban  5 mg Per Tube BID   feeding supplement (ENSURE ENLIVE)  237 mL Oral TID BM   multivitamin  15 mL Per Tube Daily   sennosides  5 mL Per Tube QHS   sodium chloride flush  3 mL Intravenous Q12H   thiamine  100 mg Per Tube Daily         CBG: No results for input(s): GLUCAP in the last 168 hours.  SpO2: 97 % O2 Flow Rate (L/min): 5 L/min FiO2 (%): 21 %     Antibiotics: Anti-infectives (From admission, onward)   None       DVT prophylaxis: Apixaban  Code Status: Full code  Family Communication: No family at bedside    Status is: Inpatient  Dispo: The patient is from: Home              Anticipated d/c is to: Home              Anticipated d/c date is: 12/10/2019              Patient currently medically stable for discharge  Barrier to discharge-no safe discharge, patient family is trying to arrange for place to stay.      Consultants:  PCCM  Procedures:  Tracheostomy on 11/09/2019, EGD and PEG tube placement 11/16/2019   Objective   Vitals:   12/09/19 0154 12/09/19 0739 12/09/19 0831 12/09/19 1143  BP:  120/81    Pulse: 85 85    Resp: 20 18  Temp:  97.7 F (36.5 C)    TempSrc:      SpO2: 93% 95% 96% 97%  Weight:      Height:        Intake/Output Summary (Last 24 hours) at 12/09/2019 1327 Last data filed at 12/09/2019 1093 Gross per 24 hour  Intake 840 ml  Output --  Net 840 ml    09/01 1901 - 09/03 0700 In: 720 [P.O.:720] Out: -   Filed Weights   11/19/19 0300 11/20/19 0353 11/21/19 0300  Weight: 73.7 kg 73.7 kg 73.7 kg    Physical Examination:    General-appears in no acute distress, trach collar in place  Heart-S1-S2, regular, no murmur auscultated  Lungs-clear to auscultation bilaterally, no wheezing or crackles  auscultated  Abdomen-soft, nontender, no organomegaly  Extremities-no edema in the lower extremities  Neuro-alert, oriented x3, no focal deficit noted    Data Reviewed:   No results found for this or any previous visit (from the past 240 hour(s)).  No results for input(s): LIPASE, AMYLASE in the last 168 hours. No results for input(s): AMMONIA in the last 168 hours.  Cardiac Enzymes: No results for input(s): CKTOTAL, CKMB, CKMBINDEX, TROPONINI in the last 168 hours. BNP (last 3 results) Recent Labs    10/30/19 2126  BNP 91.7       Ercilia Bettinger S Shermon Bozzi   [facility] If 7PM-7AM, please contact night-coverage at [email or web address], Office  [phone]   12/09/2019, 1:27 PM  LOS: 30 days

## 2019-12-10 MED ORDER — GUAIFENESIN 100 MG/5ML PO SOLN: 10.0000 mL | ORAL | 0 refills | Status: AC | PRN

## 2019-12-10 MED ORDER — APIXABAN 5 MG PO TABS: 5.0000 mg | ORAL_TABLET | Freq: Two times a day (BID) | ORAL | 3 refills | Status: AC

## 2019-12-10 MED ORDER — GLYCOPYRROLATE 1 MG PO TABS: 1.0000 mg | ORAL_TABLET | Freq: Three times a day (TID) | ORAL | 0 refills | Status: AC | PRN

## 2019-12-10 MED ORDER — CARVEDILOL 3.125 MG PO TABS
3.1250 mg | ORAL_TABLET | Freq: Two times a day (BID) | ORAL | 3 refills | Status: AC
Start: 2019-12-10 — End: ?

## 2019-12-10 NOTE — Progress Notes (Addendum)
Came to see patient and he was not in room.  As per nurse tech,  patient had left the hospital as his transportation was here.  Discussed with RN, patient was not formally discharged, no instructions were provided.  There was no discharge order.  Patient left the building without any instructions on follow-up.  Left AMA.

## 2019-12-10 NOTE — Discharge Summary (Signed)
Physician Discharge Summary  Marco Pittman ZOX:[id number] DOB: [birth year] DOA: 11/09/2019  PCP: Marcine Matar, MD  Admit date: 11/09/2019 Discharge date: 12/10/2019  Time spent: 50 minutes  Recommendations for Outpatient Follow-up:  1. Follow-up trach clinic on 12/29/2019 2. Follow-up ENT in 2 weeks 3. Follow-up PCP in 1 week 4. Follow-up general surgery in 6 to 8-week for G-tube removal  Discharge Diagnoses:  Active Problems:   Essential hypertension, benign   History of alcohol abuse   Acute respiratory failure with hypoxia (HCC)   Glottic stenosis   Dysphagia   Discharge Condition: Stable  Diet recommendation: Regular diet  Filed Weights   11/19/19 0300 11/20/19 0353 11/21/19 0300  Weight: 73.7 kg 73.7 kg 73.7 kg    History of present illness:  59 year old male with history of hypertension, pancreatitis, alcohol abuse, primary biliary cirrhosis presented with shortness of breath in setting of glottic stenosis requiring emergent tracheostomy.  PCCM recommended Shiley cuffless trach with backup #4 Shiley cuffless trach suction and trach supplies including gauze and Kling supplies as well as additional PMV on discharge.  Patient will follow up with trach clinic, [name] at the time of discharge  Hospital Course:  Acute hypoxemic respiratory failure-secondary to glottic stenosis.  Underwent emergent tracheostomy on 1121 by ENT.  Stable on tracheostomy collar on room air.  Secretions are improving. Home Trach / Discharge Needs: -DME ordered for discharge will need to include: 6. Shiley cuffless trach with a backup #4 cuffless trach, suction, trach supplies to include gauze and cleaning supplies as well as an additional PMV. He will also need outpatient SLP follow-up. And patient can follow-up in the trach clinic with [name], ACNP [phone].   Currently waiting for 30 days to complete post tracheostomy so he can be changed to cuffless.  History of  pulmonary embolism-continue Eliquis  Severe oropharyngeal dysphagia-s/p PEG tube placement by general surgery on 11/16/19.  SLP evaluation done recommend regular diet with thin liquids.    Patient to follow-up with general surgery in 6 to 8 weeks for PEG tube removal.  Hypertension-blood pressure is controlled.  Continue Coreg  History of alcohol abuse-no signs of alcohol withdrawal.  Continue thiamine.  Chronic pancreatitis-patient stable and asymptomatic.   Procedures:    Consultations:  Pulmonology ENT General surgery  Discharge Exam: Vitals:   12/10/19 0739 12/10/19 0824  BP: 100/71   Pulse: 100   Resp: 20   Temp: 98.3 F (36.8 C)   SpO2: 94% 96%    General: Appears in no acute distress Cardiovascular: S1-S2, regular Respiratory: Clear to auscultation bilaterally  Discharge Instructions   Discharge Instructions    Call MD for:  difficulty breathing, headache or visual disturbances   Complete by: As directed    Call MD for:  extreme fatigue   Complete by: As directed    Call MD for:  persistant nausea and vomiting   Complete by: As directed    Call MD for:  severe uncontrolled pain   Complete by: As directed    Call MD for:  temperature >100.4   Complete by: As directed    Diet - low sodium heart healthy   Complete by: As directed    Discharge instructions   Complete by: As directed    It has been a pleasure taking care of you!  You were hospitalized due to wheezing and difficulty swallowing.  You were treated surgically and medically.  We are discharging you to follow-up with ENT in 2 to  3 weeks.  You may have to make a phone call as soon as possible to schedule this appointment.  The pulmonologist office will call you to schedule follow-up appointment as well.   We may have started you on other new medications or made some changes to your home medications during this hospitalization. Please review your new medication list and the directions  carefully before you take them.    Please go to your hospital follow-up appointments or call to reschedule as recommended.   Take care,   Discharge wound care:   Complete by: As directed    Trach care explained to the patient   Increase activity slowly   Complete by: As directed    Increase activity slowly   Complete by: As directed    No dressing needed   Complete by: As directed    No wound care   Complete by: As directed      Allergies as of 12/10/2019      Reactions   Lisinopril Swelling   Angioedema      Medication List    STOP taking these medications   amLODipine 10 MG tablet Commonly known as: NORVASC     TAKE these medications   apixaban 5 MG Tabs tablet Commonly known as: ELIQUIS Place 1 tablet (5 mg total) into feeding tube 2 (two) times daily. What changed:   how much to take  how to take this  when to take this  additional instructions   carvedilol 3.125 MG tablet Commonly known as: COREG Place 1 tablet (3.125 mg total) into feeding tube 2 (two) times daily with a meal. What changed: how to take this   glycopyrrolate 1 MG tablet Commonly known as: ROBINUL Take 1 tablet (1 mg total) by mouth 3 (three) times daily as needed (Heavy secretions).   guaiFENesin 100 MG/5ML Soln Commonly known as: ROBITUSSIN Place 10 mLs (200 mg total) into feeding tube every 4 (four) hours as needed for cough or to loosen phlegm.   multivitamin with minerals Tabs tablet Place 1 tablet into feeding tube daily. What changed: how to take this   thiamine 100 MG tablet Place 1 tablet (100 mg total) into feeding tube daily. What changed: how to take this   ursodiol 500 MG tablet Commonly known as: ACTIGALL Place 1 tablet (500 mg total) into feeding tube 3 (three) times daily. What changed:   how much to take  how to take this  when to take this  additional instructions            Durable Medical Equipment  (From admission, onward)         Start      Ordered   11/14/19 1509  For home use only DME Trach supplies  Once       Question Answer Comment  Trach Type Shiley   Size #6   Cuffed or Uncuffed Uncuffed   Fenestrated No   Does patient need speaking valve? Yes   Trach Humidification No   Suction Needed? Yes   Manual Resuscitator Needed? No   Emergency Backup Trach size (one size smaller than trach in throat) #4 shiley cuffless      11/14/19 1509           Discharge Care Instructions  (From admission, onward)         Start     Ordered   12/10/19 0000  Discharge wound care:       Comments: Trach care explained to  the patient   12/10/19 1355   11/17/19 0000  No dressing needed        11/17/19 0709         Allergies  Allergen Reactions  . Lisinopril Swelling    Angioedema    Follow-up Information    [name], MD. Schedule an appointment as soon as possible for a visit in 2 week(s).   Specialty: Otolaryngology Contact information: [address] [place] [address] [phone]        Marcine Matar, MD. Schedule an appointment as soon as possible for a visit in 1 week(s).   Specialty: Internal Medicine Contact information: [address] [place] [address] [phone]        [facility]. Call.   Why: Call in 6-8 weeks for G-tube removal if taking in a diet at that time.  Contact information: [address] [place] [address] [phone]       [facility]. Go on 12/29/2019.   Why: Please attend your appt with [name] at the trach clinic on Thursday, 12/29/19.  Please arrive at 1230 at the [facility] and proceed to hospital admissions to check in for this appt.  You must have been tested for covid prior to this appt. Contact information: [address] [place] [address] [phone]       [facility]. Go on 12/27/2019.   Why: Please get your covid test  completed at [facility] on Tuesday, 12/27/19, at 1030AM.  A negative test is required before your appt at the trach clinic on 12/29/19.  Come to main entrance at [facility] and they will direct you to the testing site. Contact information: [address] [place] [phone]               The results of significant diagnostics from this hospitalization (including imaging, microbiology, ancillary and laboratory) are listed below for reference.    Significant Diagnostic Studies: CT ABDOMEN WO CONTRAST  Result Date: 11/14/2019 CLINICAL DATA:  59 year old with dysphagia. Evaluate anatomy for possible percutaneous gastrostomy tube placement. EXAM: CT ABDOMEN WITHOUT CONTRAST TECHNIQUE: Multidetector CT imaging of the abdomen was performed following the standard protocol without IV contrast. COMPARISON:  Chest CT 10/31/2019 and abdominal CT 02/25/2016 FINDINGS: Lower chest: Increased consolidation at the posterior left lower lobe. Again noted is volume loss and consolidation in the right lower lobe. Hepatobiliary: No acute abnormality to the liver or gallbladder. Pancreas: Scattered pancreatic calcifications suggestive for prior inflammation. Spleen: Normal in size without focal abnormality. Adrenals/Urinary Tract: Stable appearance of the adrenal glands. No definite adrenal lesions. Mild adrenal thickening is similar to the previous examination. Normal appearance of both kidneys without stones or hydronephrosis. Urinary bladder may be distended and appears to be partially visualized in the lower abdomen. Stomach/Bowel: There is high-density contrast in the colon and small bowel loops in the anterior abdomen. Unfortunately, some of the small bowel loops are anterior to the gastric antrum region and could prohibit percutaneous gastric tube placement. No evidence for bowel obstruction. Vascular/Lymphatic: Atherosclerotic calcifications in the abdominal aorta and proximal  iliac arteries. No significant lymph node enlargement in the abdomen. Other: Negative for ascites. Musculoskeletal: No acute bone abnormality. IMPRESSION: 1. Loops of small bowel are located between the anterior abdominal wall and the stomach. The small bowel loops may prohibit placement of a percutaneous gastrostomy tube. 2. Consolidation  at the lung bases. Left base consolidation has progressed since the prior chest CT. Findings are concerning for pneumonia/aspiration and atelectasis. Electronically Signed   By: Richarda Overlie M.D.   On: 11/14/2019 12:00   MR BRAIN WO CONTRAST  Result Date: 11/14/2019 CLINICAL DATA:  Initial evaluation for neuro deficit, stroke suspected. EXAM: MRI HEAD WITHOUT CONTRAST TECHNIQUE: Multiplanar, multiecho pulse sequences of the brain and surrounding structures were obtained without intravenous contrast. COMPARISON:  Prior CT from 08/28/2007. FINDINGS: Brain: Mild age-related cerebral atrophy. Patchy T2/FLAIR hyperintensity within the periventricular white matter most consistent with chronic small vessel ischemic disease, mild for age. No abnormal foci of restricted diffusion to suggest acute or subacute ischemia. Gray-white matter differentiation maintained. No encephalomalacia to suggest chronic cortical infarction. No acute intracranial hemorrhage. Single chronic microhemorrhage noted at the posterior right frontal centrum semi ovale (series 24, image 33), of doubtful significance in isolation. No other foci of susceptibility artifact to suggest chronic intracranial hemorrhage. No mass lesion, midline shift or mass effect. No hydrocephalus or extra-axial fluid collection. Pituitary gland suprasellar region normal. Midline structures intact. Vascular: Major intracranial vascular flow voids are maintained. Skull and upper cervical spine: Craniocervical junction normal. Bone marrow signal intensity somewhat heterogeneous without focal marrow replacing lesion. No scalp soft tissue  abnormality. Sinuses/Orbits: Globes and orbital soft tissues demonstrate no acute finding. Remote posttraumatic defects at the bilateral lamina papyracea noted. Paranasal sinuses are largely clear. Small bilateral mastoid effusions noted, of doubtful significance. Visualized nasopharynx within normal limits. Inner ear structures grossly normal. Other: None. IMPRESSION: 1. No acute intracranial abnormality. 2. Mild age-related cerebral atrophy with chronic small vessel ischemic disease. Electronically Signed   By: Rise Mu M.D.   On: 11/14/2019 23:18   DG Swallowing Func-Speech Pathology  Result Date: 11/23/2019 Objective Swallowing Evaluation: Type of Study: MBS-Modified Barium Swallow Study  Patient Details Name: ISHMEL ACEVEDO MRN: [id number] Date of Birth: 10-21-1960 Today's Date: 11/23/2019 Time: SLP Start Time (ACUTE ONLY): 1448 -SLP Stop Time (ACUTE ONLY): 1506 SLP Time Calculation (min) (ACUTE ONLY): 18 min Past Medical History: Past Medical History: Diagnosis Date . Aneurysm (HCC)  . ETOH abuse  . Hypertension  . Pancreatitis 03/04/2016 . Umbilical hernia   symptomatic umbilical hernia/notes 03/04/2016 Past Surgical History: Past Surgical History: Procedure Laterality Date . GASTROSTOMY N/A 11/16/2019  Procedure: INSERTION OF GASTROSTOMY TUBE,;  Surgeon: Diamantina Monks, MD;  Location: [facility] OR;  Service: General;  Laterality: N/A; . INSERTION OF MESH N/A 04/14/2016  Procedure: INSERTION OF MESH;  Surgeon: Avel Peace, MD;  Location: [facility];  Service: General;  Laterality: N/A; . MICROLARYNGOSCOPY N/A 11/09/2019  Procedure: MICROLARYNGOSCOPY;  Surgeon: [name], MD;  Location: [facility] OR;  Service: ENT;  Laterality: N/A; . TRACHEOSTOMY TUBE PLACEMENT N/A 11/09/2019  Procedure: AWAKE TRACHEOSTOMY;  Surgeon: [name], MD;  Location: [facility] OR;  Service: ENT;  Laterality: N/A; . UMBILICAL HERNIA REPAIR N/A 04/14/2016  Procedure: UMBILICAL HERNIA REPAIR WITH MESH;  Surgeon: Avel Peace, MD;  Location: [facility];  Service: General;  Laterality: N/A; HPI: 59 yo male smoker presented to ER 7/26 with worsening shortness of breath and chest discomfort. Found to have severe hypertension with SBP > 200.  CT chest showed b/l PE and RLL ASD with mucus plugging concerning for aspiration pneumonia. PMH: ETOH, HTN, Pancreatitis, Aneurysm. ETT 7/26-27; reintubated 7/27-29 due to angioedema. ENT fiberoptic exam revealing severe subglottic stenosis with "immobile vocal folds with only a slit of glottic space for breathing" and  underwent emergent trach 8/4 (#8 cuffed). Seen 11/03/19 post extubation with regular and nectar thick recommended and upgraded to thin liquids the following day.    Pt now with severe dysphagia noted on MBS testing x2.  His trach is now changed to a number 6 cuffless and thus he is able to move air around his trach and speak for a few seconds before breathstacking noted. Repeating MBS prior to discharge tomorrow.  Subjective: pt awake in bed Assessment / Plan / Recommendation CHL IP CLINICAL IMPRESSIONS 11/23/2019 Clinical Impression Pt's swallow function has improved with decreased pharyngeal edema noted. Study performed with PMV doffed. Orally his control and propulsion was adequate and timely with minimal tongue base residue. Pt's anterior hyolaryngeal excursion and elevation appeared reduced but functional leaving minimal pyriform sinus residue. Laryngeal closure, however, was mistimed and pt aspirated honey thick from either pyriform sinus residue or interarytenoid residue without immediate sensation. Penetration present high in vestibule with puree. Compensatory strategies using chin tuck and breath hold (isolated) was not an effective measure for pt to use with any consistency liquid. Pt did have a delayed cough to aspiration with barium expelled from trach. Recommend Kayton initiate Dys 1 (puree), pudding thick liquids (nothing thinner than puree/pudding),  meds via PEG and hard throat clear intermittently. Continue ST while here and recommend continue at SNF.    SLP Visit Diagnosis Dysphagia, pharyngeal phase (R13.13) Attention and concentration deficit following -- Frontal lobe and executive function deficit following -- Impact on safety and function Moderate aspiration risk   CHL IP TREATMENT RECOMMENDATION 11/23/2019 Treatment Recommendations Therapy as outlined in treatment plan below   Prognosis 11/23/2019 Prognosis for Safe Diet Advancement Good Barriers to Reach Goals Severity of deficits Barriers/Prognosis Comment -- CHL IP DIET RECOMMENDATION 11/23/2019 SLP Diet Recommendations Dysphagia 1 (Puree) solids;Pudding thick liquid Liquid Administration via -- Medication Administration Via alternative means Compensations Small sips/bites;Slow rate;Clear throat intermittently Postural Changes Seated upright at 90 degrees   CHL IP OTHER RECOMMENDATIONS 11/23/2019 Recommended Consults -- Oral Care Recommendations Oral care BID Other Recommendations Order thickener from pharmacy   CHL IP FOLLOW UP RECOMMENDATIONS 11/23/2019 Follow up Recommendations Skilled Nursing facility   [facility] IP FREQUENCY AND DURATION 11/23/2019 Speech Therapy Frequency (ACUTE ONLY) min 2x/week Treatment Duration 2 weeks      CHL IP ORAL PHASE 11/23/2019 Oral Phase WFL Oral - Pudding Teaspoon -- Oral - Pudding Cup -- Oral - Honey Teaspoon NT Oral - Honey Cup -- Oral - Nectar Teaspoon NT Oral - Nectar Cup WFL Oral - Nectar Straw NT Oral - Thin Teaspoon NT Oral - Thin Cup NT Oral - Thin Straw -- Oral - Puree -- Oral - Mech Soft -- Oral - Regular -- Oral - Multi-Consistency -- Oral - Pill -- Oral Phase - Comment --  CHL IP PHARYNGEAL PHASE 11/23/2019 Pharyngeal Phase Impaired Pharyngeal- Pudding Teaspoon -- Pharyngeal -- Pharyngeal- Pudding Cup -- Pharyngeal -- Pharyngeal- Honey Teaspoon NT Pharyngeal -- Pharyngeal- Honey Cup Penetration/Aspiration during swallow;Pharyngeal residue - pyriform;Reduced laryngeal  elevation;Reduced anterior laryngeal mobility Pharyngeal Material enters airway, remains ABOVE vocal cords and not ejected out;Material enters airway, passes BELOW cords without attempt by patient to eject out (silent aspiration);Material enters airway, passes BELOW cords and not ejected out despite cough attempt by patient Pharyngeal- Nectar Teaspoon NT Pharyngeal -- Pharyngeal- Nectar Cup Pharyngeal residue - pyriform;Penetration/Aspiration during swallow;Reduced anterior laryngeal mobility;Reduced laryngeal elevation Pharyngeal Material enters airway, passes BELOW cords without attempt by patient to eject out (silent aspiration) Pharyngeal- Nectar Straw --  Pharyngeal -- Pharyngeal- Thin Teaspoon NT Pharyngeal -- Pharyngeal- Thin Cup NT Pharyngeal -- Pharyngeal- Thin Straw -- Pharyngeal -- Pharyngeal- Puree Penetration/Aspiration during swallow Pharyngeal Material enters airway, remains ABOVE vocal cords and not ejected out Pharyngeal- Mechanical Soft -- Pharyngeal -- Pharyngeal- Regular WFL Pharyngeal -- Pharyngeal- Multi-consistency -- Pharyngeal -- Pharyngeal- Pill -- Pharyngeal -- Pharyngeal Comment --  CHL IP CERVICAL ESOPHAGEAL PHASE 11/23/2019 Cervical Esophageal Phase WFL Pudding Teaspoon -- Pudding Cup -- Honey Teaspoon -- Honey Cup -- Nectar Teaspoon -- Nectar Cup -- Nectar Straw -- Thin Teaspoon -- Thin Cup -- Thin Straw -- Puree -- Mechanical Soft -- Regular -- Multi-consistency -- Pill -- Cervical Esophageal Comment -- [name] 11/23/2019, 4:03 PM [name] administrator Pager [phone] Office [phone]              DG Swallowing Func-Speech Pathology  Result Date: 11/14/2019 Objective Swallowing Evaluation: Type of Study: MBS-Modified Barium Swallow Study  Patient Details Name: MONICA ZAHLER MRN: [id number] Date of Birth: 10-03-1960 Today's Date: 11/14/2019 Time: SLP Start Time (ACUTE ONLY): 0825 -SLP Stop Time (ACUTE ONLY): 0857 SLP Time Calculation  (min) (ACUTE ONLY): 32 min Past Medical History: Past Medical History: Diagnosis Date . Aneurysm (HCC)  . ETOH abuse  . Hypertension  . Pancreatitis 03/04/2016 . Umbilical hernia   symptomatic umbilical hernia/notes 03/04/2016 Past Surgical History: Past Surgical History: Procedure Laterality Date . INSERTION OF MESH N/A 04/14/2016  Procedure: INSERTION OF MESH;  Surgeon: Avel Peace, MD;  Location: [facility];  Service: General;  Laterality: N/A; . MICROLARYNGOSCOPY N/A 11/09/2019  Procedure: MICROLARYNGOSCOPY;  Surgeon: [name], MD;  Location: [facility] OR;  Service: ENT;  Laterality: N/A; . TRACHEOSTOMY TUBE PLACEMENT N/A 11/09/2019  Procedure: AWAKE TRACHEOSTOMY;  Surgeon: [name], MD;  Location: [facility] OR;  Service: ENT;  Laterality: N/A; . UMBILICAL HERNIA REPAIR N/A 04/14/2016  Procedure: UMBILICAL HERNIA REPAIR WITH MESH;  Surgeon: Avel Peace, MD;  Location: [facility];  Service: General;  Laterality: N/A; HPI: 59 yo male smoker presented to ER 7/26 with worsening shortness of breath and chest discomfort. Found to have severe hypertension with SBP > 200.  CT chest showed b/l PE and RLL ASD with mucus plugging concerning for aspiration pneumonia. PMH: ETOH, HTN, Pancreatitis, Aneurysm. ETT 7/26-27; reintubated 7/27-29 due to angioedema. 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 Seen 11/03/19 post extubation with regular and nectar thick recommended and upgraded to thin liquids the following day.   Subjective: pt awake in chair Assessment / Plan / Recommendation CHL IP CLINICAL IMPRESSIONS 11/14/2019 Clinical Impression Pt presents with marginal improvement in swallowing compared to prior MBS 3 days ago.  He continues to grossly aspirate unfortunately without clearance via cough nor sensation - causing SLP to be concerned for source of dysphagia.   Pt does demonstrate improved clearance of barium into esophagus but pyriform sinus appears shallow which allows residuals to spill into open airway. Various postures including HOB lowered, head turn, chin tuck, supraglottic swallow were trialed in hopes to improve airway protection. His dysphagia continues to present as decreased laryngeal elevation *anterior laryngeal movement observed*, decreased epigllotic deflection allowing penetration/aspiration during the swallow and after as pyriform sinus spills into open larynx.  Pt continues with copious secretions that mix with barium and are aspirated.  At this time, recommend pt remain npo x tsps of  water and single ice chips after mouth care only and monitor tolerance closely.  Spoke to Dr Maryfrances Bunnell with recommendations.  Recommend follow up SLP to maximize swallowing rehabilitation. Pt still had a number 8 trach with cuff deflated during testing- no PMSV in place.  Downsizing trach would not have improved swallow ability to a functional level in this SLPs opinion.   SLP Visit Diagnosis Dysphagia, pharyngoesophageal phase (R13.14) Attention and concentration deficit following -- Frontal lobe and executive function deficit following -- Impact on safety and function Risk for inadequate nutrition/hydration;Severe aspiration risk   CHL IP TREATMENT RECOMMENDATION 11/14/2019 Treatment Recommendations Therapy as outlined in treatment plan below   Prognosis 11/14/2019 Prognosis for Safe Diet Advancement Guarded Barriers to Reach Goals Other (Comment) Barriers/Prognosis Comment -- CHL IP DIET RECOMMENDATION 11/14/2019 SLP Diet Recommendations NPO;Ice chips PRN after oral care Liquid Administration via Spoon Medication Administration Via alternative means Compensations Small sips/bites Postural Changes --   CHL IP OTHER RECOMMENDATIONS 11/14/2019 Recommended Consults -- Oral Care Recommendations Oral care QID;Other (Comment) Other Recommendations --   CHL IP FOLLOW UP RECOMMENDATIONS  11/14/2019 Follow up Recommendations Outpatient SLP   CHL IP FREQUENCY AND DURATION 11/14/2019 Speech Therapy Frequency (ACUTE ONLY) min 2x/week Treatment Duration 2 weeks      CHL IP ORAL PHASE 11/14/2019 Oral Phase Impaired Oral - Pudding Teaspoon -- Oral - Pudding Cup -- Oral - Honey Teaspoon WFL Oral - Honey Cup -- Oral - Nectar Teaspoon WFL Oral - Nectar Cup WFL Oral - Nectar Straw WFL Oral - Thin Teaspoon WFL;Premature spillage Oral - Thin Cup [facility] Oral - Thin Straw -- Oral - Puree -- Oral - Mech Soft -- Oral - Regular -- Oral - Multi-Consistency -- Oral - Pill -- Oral Phase - Comment --  CH[facility] IP PHARYNGEAL PHASE 11/14/2019 Pharyngeal Phase Impaired Pharyngeal- Pudding Teaspoon -- Pharyngeal -- Pharyngeal- Pudding Cup -- Pharyngeal -- Pharyngeal- Honey Teaspoon Reduced laryngeal elevation;Reduced epiglottic inversion;Penetration/Apiration after swallow;Moderate aspiration;Pharyngeal residue - pyriform;Pharyngeal residue - posterior pharnyx;Other (Comment) Pharyngeal Material enters airway, passes BELOW cords without attempt by patient to eject out (silent aspiration) Pharyngeal- Honey Cup -- Pharyngeal -- Pharyngeal- Nectar Teaspoon Delayed swallow initiation-pyriform sinuses;Reduced epiglottic inversion;Reduced laryngeal elevation;Reduced airway/laryngeal closure;Penetration/Aspiration during swallow;Penetration/Apiration after swallow;Pharyngeal residue - pyriform Pharyngeal Material enters airway, passes BELOW cords without attempt by patient to eject out (silent aspiration) Pharyngeal- Nectar Cup Significant aspiration (Amount);Pharyngeal residue - pyriform;Reduced airway/laryngeal closure;Reduced laryngeal elevation;Reduced epiglottic inversion Pharyngeal Material enters airway, passes BELOW cords without attempt by patient to eject out (silent aspiration) Pharyngeal- Nectar Straw -- Pharyngeal -- Pharyngeal- Thin Teaspoon Delayed swallow initiation-pyriform sinuses;Reduced epiglottic inversion;Reduced laryngeal  elevation;Reduced airway/laryngeal closure;Reduced tongue base retraction;Significant aspiration (Amount);Penetration/Aspiration during swallow;Penetration/Apiration after swallow;Pharyngeal residue - pyriform Pharyngeal Material enters airway, passes BELOW cords without attempt by patient to eject out (silent aspiration) Pharyngeal- Thin Cup Delayed swallow initiation-pyriform sinuses;Reduced epiglottic inversion;Reduced laryngeal elevation;Reduced airway/laryngeal closure;Pharyngeal residue - pyriform;Penetration/Apiration after swallow;Penetration/Aspiration during swallow Pharyngeal Material enters airway, passes BELOW cords without attempt by patient to eject out (silent aspiration) Pharyngeal- Thin Straw -- Pharyngeal -- Pharyngeal- Puree -- Pharyngeal -- Pharyngeal- Mechanical Soft -- Pharyngeal -- Pharyngeal- Regular -- Pharyngeal -- Pharyngeal- Multi-consistency -- Pharyngeal -- Pharyngeal- Pill -- Pharyngeal -- Pharyngeal Comment Pt is grossly aspirating secretions that mix with barium, HOB lowered to approx 45* did not improve transit into esophagus,  head turn right with honey via tsp inconsisently prevented aspiration-decreased retention, head turn left worsened swallow function with honey via tsp, cued supraglottic swallow did not prevent aspiration after  swallow, aspiration occurs during swallow due to decreased laryngeal elevation *albeit minimally improved compared to prior*  and after due to pyriform sinus retention spilling into open airway without ability to clear with cued cough, on most swallows approximately 75% of boluses are entering into the esophagus which is an improvement compared to 8/6  [facility] IP CERVICAL ESOPHAGEAL PHASE 11/14/2019 Cervical Esophageal Phase -- Pudding Teaspoon -- Pudding Cup -- Honey Teaspoon -- Honey Cup -- Nectar Teaspoon -- Nectar Cup -- Nectar Straw -- Thin Teaspoon -- Thin Cup -- Thin Straw -- Puree -- Mechanical Soft -- Regular -- Multi-consistency -- Pill --  Cervical Esophageal Comment appearance of severe stasis at pyriform sinus region Rolena Infanteammy K, MS [facility] S[facility] Acute Rehab Services Office [phone] Chales AbrahamsKimball, Tamara Ann 11/14/2019, 10:12 AM              DG Swallowing Func-Speech Pathology  Result Date: 11/11/2019 Objective Swallowing Evaluation: Type of Study: MBS-Modified Barium Swallow Study  Patient Details Name: Pollyann SavoyMichael W Bathgate MRN: [id number] Date of Birth: 06/13/1960 Today's Date: 11/11/2019 Time: SLP Start Time (ACUTE ONLY): 0945 -SLP Stop Time (ACUTE ONLY): 1015 SLP Time Calculation (min) (ACUTE ONLY): 30 min Past Medical History: Past Medical History: Diagnosis Date . Aneurysm (HCC)  . ETOH abuse  . Hypertension  . Pancreatitis 03/04/2016 . Umbilical hernia   symptomatic umbilical hernia/notes 03/04/2016 Past Surgical History: Past Surgical History: Procedure Laterality Date . INSERTION OF MESH N/A 04/14/2016  Procedure: INSERTION OF MESH;  Surgeon: Avel Peaceodd Rosenbower, MD;  Location: [facility];  Service: General;  Laterality: N/A; . MICROLARYNGOSCOPY N/A 11/09/2019  Procedure: MICROLARYNGOSCOPY;  Surgeon: Christia ReadingBates, Dwight, MD;  [facility];  Service: ENT;  Laterality: N/A; . TRACHEOSTOMY TUBE PLACEMENT N/A 11/09/2019  Procedure: AWAKE TRACHEOSTOMY;  Surgeon: Christia ReadingBates, Dwight, MD;  [facility];  Service: ENT;  Laterality: N/A; . UMBILICAL HERNIA REPAIR N/A 04/14/2016  Procedure: UMBILICAL HERNIA REPAIR WITH MESH;  Surgeon: Avel Peaceodd Rosenbower, MD;  Location: [facility];  Service: General;  Laterality: N/A; HPI: 59 yo male smoker presented to ER 7/26 with worsening shortness of breath and chest discomfort. Found to have severe hypertension with SBP > 200.  CT chest showed b/l PE and RLL ASD with mucus plugging concerning for aspiration pneumonia. PMH: ETOH, HTN, Pancreatitis, Aneurysm. ETT 7/26-27; reintubated 7/27-29 due to angioedema. 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 Seen 11/03/19 post extubation with regular and nectar thick recommended and upgraded to thin liquids the following day.   Subjective: pt awake in bed Assessment / Plan / Recommendation CHL IP CLINICAL IMPRESSIONS 11/11/2019 Clinical Impression Pt presents with gross pharyngeal and cervical esophageal dysphagia with question of appearance of severe edema with gross aspiration without consistent sensation nor ability to clear.  Minimal tongue base, pharyngeal contraction and laryngeal elevation results in aspiration during and after the swallow.  Minimal laryngeal closure and opening of cervical esophagus noted.  Thus barium enters minimally closed airway with swallow attempts and after as retention of barium and secretions spill into trachea.  HOB lowered to approx 45* did not improve pharyngeal clearance. Dry swallows were not effective to transit retention into pharynx as minimal movement was observed. Pt could not adequately expectorate all of the barium but when reflexively coughed, cough was strong.  Delayed cough noted - suspect due to deeper aspiration with some ability to clear with viscous secretions  via trach. Pt was only provided a few tsps of liquids during testing due to severity of dysphagia/edema/aspiration. After completion of MBS, examination of trachea indicated secretions with barium retained.  Would recommend pt be npo x ice chips after oral care.  Question if large amount of dysphagia could be secondary to edema which would improve prognosis for swallow recovery. Will follow up for treatment and education.  Using teach back, pt educated to findings/recommendation.  SLP Informed RN and MD of findings and spoke to MD re: this case. SLP Visit Diagnosis Dysphagia, pharyngoesophageal phase (R13.14) Attention and concentration deficit following -- Frontal lobe and executive function deficit following -- Impact on safety and function Risk  for inadequate nutrition/hydration;Severe aspiration risk   [facility] IP TREATMENT RECOMMENDATION 11/10/2019 Treatment Recommendations Therapy as outlined in treatment plan below   Prognosis 11/11/2019 Prognosis for Safe Diet Advancement Fair Barriers to Reach Goals -- Barriers/Prognosis Comment -- [facility] IP DIET RECOMMENDATION 11/11/2019 SLP Diet Recommendations NPO;Ice chips PRN after oral care Liquid Administration via -- Medication Administration Via alternative means Compensations -- Postural Changes --             [facility] IP FOLLOW UP RECOMMENDATIONS 11/11/2019 Follow up Recommendations Other (comment)   CHL IP FREQUENCY AND DURATION 11/11/2019 Speech Therapy Frequency (ACUTE ONLY) min 2x/week Treatment Duration 2 weeks      CHL IP ORAL PHASE 11/11/2019 Oral Phase WFL Oral - Pudding Teaspoon -- Oral - Pudding Cup -- Oral - Honey Teaspoon -- Oral - Honey Cup -- Oral - Nectar Teaspoon WFL;Premature spillage Oral - Nectar Cup -- Oral - Nectar Straw -- Oral - Thin Teaspoon WFL Oral - Thin Cup -- Oral - Thin Straw -- Oral - Puree -- Oral - Mech Soft -- Oral - Regular -- Oral - Multi-Consistency -- Oral - Pill -- Oral Phase - Comment --  CHL IP PHARYNGEAL PHASE 11/11/2019 Pharyngeal Phase Impaired Pharyngeal- Pudding Teaspoon -- Pharyngeal -- Pharyngeal- Pudding Cup -- Pharyngeal -- Pharyngeal- Honey Teaspoon -- Pharyngeal -- Pharyngeal- Honey Cup -- Pharyngeal -- Pharyngeal- Nectar Teaspoon Reduced pharyngeal peristalsis;Reduced epiglottic inversion;Reduced anterior laryngeal mobility;Reduced laryngeal elevation;Reduced airway/laryngeal closure;Reduced tongue base retraction;Penetration/Aspiration before swallow;Penetration/Aspiration during swallow;Penetration/Apiration after swallow;Significant aspiration (Amount);Pharyngeal residue - valleculae Pharyngeal Material enters airway, passes BELOW cords without attempt by patient to eject out (silent aspiration) Pharyngeal- Nectar Cup -- Pharyngeal -- Pharyngeal- Nectar Straw -- Pharyngeal --  Pharyngeal- Thin Teaspoon Reduced pharyngeal peristalsis;Reduced epiglottic inversion;Reduced anterior laryngeal mobility;Reduced laryngeal elevation;Significant aspiration (Amount);Pharyngeal residue - valleculae;Reduced airway/laryngeal closure;Penetration/Apiration after swallow;Reduced tongue base retraction;Penetration/Aspiration before swallow Pharyngeal Material enters airway, passes BELOW cords without attempt by patient to eject out (silent aspiration) Pharyngeal- Thin Cup -- Pharyngeal -- Pharyngeal- Thin Straw -- Pharyngeal -- Pharyngeal- Puree -- Pharyngeal -- Pharyngeal- Mechanical Soft -- Pharyngeal -- Pharyngeal- Regular -- Pharyngeal -- Pharyngeal- Multi-consistency -- Pharyngeal -- Pharyngeal- Pill -- Pharyngeal -- Pharyngeal Comment HOB lowered to approximately 45* did not improve transit into esophagus, cued dry swallows not helpful - minimal muscular contraction, cued cough did NOT clear aspirates, pt has no sensation to gross aspiration  CHL IP CERVICAL ESOPHAGEAL PHASE 11/11/2019 Cervical Esophageal Phase Impaired Pudding Teaspoon -- Pudding Cup -- Honey Teaspoon -- Honey Cup -- Nectar Teaspoon -- Nectar Cup -- Nectar Straw -- Thin Teaspoon -- Thin Cup -- Thin Straw -- Puree -- Mechanical Soft -- Regular -- Multi-consistency -- Pill -- Cervical Esophageal Comment ???  appearance of gross edema negatively impacting transit of boluses into esophagus, pyriform sinus retention mixed with secretions spills  into open airway after the swallow Rolena Infante, MS [facility] SLP Acute Rehab Services Office [phone] Chales Abrahams 11/11/2019, 11:23 AM                BNP (last 3 results) Recent Labs    10/30/19 2126  BNP 91.7        Signed:  Meredeth Ide MD.  [facility] 12/10/2019, 1:57 PM

## 2019-12-10 NOTE — Progress Notes (Signed)
Patient had left the hospital, he was called from his home.  He came back to the hospital.  Examined patient in the room.  Wants to be discharged home.

## 2019-12-13 ENCOUNTER — Telehealth: Payer: Self-pay

## 2019-12-13 NOTE — Telephone Encounter (Signed)
Transition Care Management Follow-up Telephone Call Date of discharge and from where: 12/10/2019, [facility]   Call placed to patient # [phone], voicemail full, unable to leave a message.    As per Mliss Fritz, CMA the patient came to [facility] today and scheduled a follow up appointment for 12/16/2019

## 2019-12-13 NOTE — Telephone Encounter (Signed)
Patient called back to speak to Erskine Squibb [phone]

## 2019-12-14 ENCOUNTER — Telehealth: Payer: Self-pay

## 2019-12-14 NOTE — Telephone Encounter (Signed)
Transition Care Management Follow-up Telephone Call Attempt #2  Date of discharge and from where: 12/10/2019, [facility]   Call returned to patient # [phone], voicemail full, unable to leave a message.    As per Mliss Fritz, CMA the patient came to [facility] today and scheduled a follow up appointment for 12/16/2019

## 2019-12-16 ENCOUNTER — Encounter: Payer: Self-pay | Admitting: Internal Medicine

## 2019-12-16 ENCOUNTER — Ambulatory Visit: Payer: Medicaid Other | Attending: Primary Care | Admitting: Internal Medicine

## 2019-12-16 ENCOUNTER — Other Ambulatory Visit: Payer: Self-pay

## 2019-12-16 VITALS — BP 138/88 | HR 93 | Resp 16 | Wt 183.8 lb

## 2019-12-16 DIAGNOSIS — Z930 Tracheostomy status: Secondary | ICD-10-CM

## 2019-12-16 DIAGNOSIS — Z2821 Immunization not carried out because of patient refusal: Secondary | ICD-10-CM

## 2019-12-16 DIAGNOSIS — Z931 Gastrostomy status: Secondary | ICD-10-CM

## 2019-12-16 DIAGNOSIS — J386 Stenosis of larynx: Secondary | ICD-10-CM | POA: Diagnosis not present

## 2019-12-16 DIAGNOSIS — I2699 Other pulmonary embolism without acute cor pulmonale: Secondary | ICD-10-CM | POA: Diagnosis not present

## 2019-12-16 DIAGNOSIS — Z09 Encounter for follow-up examination after completed treatment for conditions other than malignant neoplasm: Secondary | ICD-10-CM | POA: Diagnosis not present

## 2019-12-16 DIAGNOSIS — I10 Essential (primary) hypertension: Secondary | ICD-10-CM | POA: Diagnosis not present

## 2019-12-16 DIAGNOSIS — R4589 Other symptoms and signs involving emotional state: Secondary | ICD-10-CM

## 2019-12-16 NOTE — Progress Notes (Signed)
Patient ID: Marco SavoyMichael W Pittman, male    DOB: 12/21/1960  MRN: [id number]  CC: Hospitalization Follow-up   Subjective: Marco Pittman is a 59 y.o. male who presents for hosp f/u His concerns today include:  Hx of HTN, chronic pancreatitis due to ETOH, tob use, pre-DM, PBC, colon polyps.  Since I last saw him in mid July, patient has had 3 hospitalizations.  He was sent to the hospital on July 15 with angioedema caused by lisinopril.  He had to be admitted to the ICU for angioedema causing acute respiratory failure with hypoxia.  He was mechanically ventilated and treated with steroids.  Hospital course complicated by some acute metabolic encephalopathy and malnutrition.  He was subsequently able to be discharged home after 6 days in the hospital. Hospitalization: 7/25-30 04/2019: Admitted with shortness of breath and chest discomfort.  Covid test was negative.  CT of the chest demonstrated bilateral pulmonary embolism and right lower lobe airspace disease with mucous plugging concerning for aspiration pneumonia.  Doppler ultrasound of the legs also positive for DVT.  Started on heparin, treated with IV antibiotics.  Required intubation for respiratory failure.  Bronchoscopy with therapeutic aspiration performed.  He was eventually able to be extubated and was discharged home on Eliquis. Hospitalization: 8/4-12/10/2019.  Patient presented with shortness of breath in acute hypoxic respiratory failure secondary to glottic stenosis requiring emergent tracheostomy.  Followed by ENT.  Hospital course complicated with severe oropharyngeal dysphagia requiring placement of PEG tube.  He was evaluated by speech pathologist and was eventually found able to return to regular diet with thin liquids.  Plan was to have him follow-up with general surgeon in 6 to 8 weeks for PEG tube removal.  In terms of his blood pressure he was continued on Coreg.  Today:  Glottic stenosis/tracheostomy: Patient reports that his  life has significantly changed due to being placed on lisinopril.  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  He also lost his apartment due to loss of income and is having to stay with his brother.  He is trying to apply for disability.  He received a form from [employer] and needs help with completing the application and support of the application.  He plans to start drawing Social Security next year when he turns [age]. -He feels down about everything that has happened to him but states that he just tries to keep moving forward.  He does not feel that he needs any counseling.  He states that he has people who he can talk to. -He is coping with the tracheostomy.  He has a follow-up appointment with the trach clinic and with ENT on the 30th of this month.  DVT/PE: He is taking the Eliquis as prescribed.  Plan is to keep him on it for about 6 months.  Reports compliance with Eliquis.  He has not had any bruising or bleeding.  Oropharyngeal dysphagia: Patient states he now eats regular solid foods and regular liquids.  Coughs occasionally.  HTN: Currently on carvedilol which he takes consistently.  He states he checks his blood pressure at home a few times a week and it has been doing good.  He feels a bit nervous when he comes to the doctor's office which he thinks may elevate his blood pressure some.  He took the carvedilol already for the morning.  Patient Active Problem List   Diagnosis Date Noted  . Presence of externally removable percutaneous endoscopic gastrostomy (PEG) tube (HCC) 12/16/2019  . Tracheostomy present (HCC) 12/16/2019  . Acute pulmonary embolism (HCC) 12/16/2019  . Dysphagia   . 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 Hypertensive urgency 10/31/2019  . Acute respiratory failure with hypoxia (HCC) 10/31/2019  . Mucus plugging of bronchi   . Acute metabolic encephalopathy 10/26/2019    . Malnutrition of moderate degree 10/22/2019  . Angio-edema 10/20/2019  . Acute hypoxemic respiratory failure (HCC)   . Former smoker 06/16/2017  . 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 Sessile colonic polyp 01/29/2017  . Pre-diabetes 01/27/2017  . Chronic left shoulder pain 01/27/2017  . Weight loss, unintentional 10/10/2016  . History of alcohol abuse 03/19/2016  . Chronic pancreatitis (HCC) 03/04/2016  . Essential hypertension, benign 10/06/2012     Current Outpatient Medications on File Prior to Visit  Medication Sig Dispense Refill  . apixaban (ELIQUIS) 5 MG TABS tablet Place 1 tablet (5 mg total) into feeding tube 2 (two) times daily. 60 tablet 3  . carvedilol (COREG) 3.125 MG tablet Place 1 tablet (3.125 mg total) into feeding tube 2 (two) times daily with a meal. 60 tablet 3  . glycopyrrolate (ROBINUL) 1 MG tablet Take 1 tablet (1 mg total) by mouth 3 (three) times daily as needed (Heavy secretions). 30 tablet 0  . guaiFENesin (ROBITUSSIN) 100 MG/5ML SOLN Place 10 mLs (200 mg total) into feeding tube every 4 (four) hours as needed for cough or to loosen phlegm. 236 mL 0  . Multiple Vitamin (MULTIVITAMIN WITH MINERALS) TABS tablet Place 1 tablet into feeding tube daily.    Marland Kitchen thiamine 100 MG tablet Place 1 tablet (100 mg total) into feeding tube daily.    . ursodiol (ACTIGALL) 500 MG tablet Place 1 tablet (500 mg total) into feeding tube 3 (three) times daily.     No current facility-administered medications on file prior to visit.    Allergies  Allergen Reactions  . Lisinopril Swelling    Angioedema    Social History   Socioeconomic History  . Marital status: Single    Spouse name: Not on file  . Number of children: Not on file  . Years of education: Not on file  . Highest education level: Not on file  Occupational History  . Not on file  Tobacco Use  . Smoking status: Current Every Day Smoker    Packs/day: 0.50    Years: 45.00    Pack years: 22.50     Types: Cigars  . Smokeless tobacco: Never Used  Substance and Sexual Activity  . Alcohol use: Yes    Alcohol/week: 14.0 standard drinks    Types: 14 Cans of beer per week    Comment: last alcohol on 03/29/16  . Drug use: No  . Sexual activity: Yes  Other Topics Concern  . Not on file  Social History Narrative  . Not on file   Social Determinants of Health   Financial Resource Strain:   . Difficulty of Paying Living Expenses: Not on file  Food Insecurity:   . Worried About Programme researcher, broadcasting/film/video in the Last Year: Not on file  . Ran Out of Food in the Last Year: Not on file  Transportation Needs:   . Lack of Transportation (Medical): Not on file  . Lack of Transportation (Non-Medical): Not on file  Physical Activity:   . Days of Exercise per Week: Not on file  .  Minutes of Exercise per Session: Not on file  Stress:   . Feeling of Stress : Not on file  Social Connections:   . Frequency of Communication with Friends and Family: Not on file  . Frequency of Social Gatherings with Friends and Family: Not on file  . Attends Religious Services: Not on file  . Active Member of Clubs or Organizations: Not on file  . Attends Banker Meetings: Not on file  . Marital Status: Not on file  Intimate Partner Violence:   . Fear of Current or Ex-Partner: Not on file  . Emotionally Abused: Not on file  . Physically Abused: Not on file  . Sexually Abused: Not on file    Family History  Problem Relation Age of Onset  . Cancer Father 68       colon    Past Surgical History:  Procedure Laterality Date  . GASTROSTOMY N/A 11/16/2019   Procedure: INSERTION OF GASTROSTOMY TUBE,;  Surgeon: Diamantina Monks, MD;  Location: [facility] OR;  Service: General;  Laterality: N/A;  . INSERTION OF MESH N/A 04/14/2016   Procedure: INSERTION OF MESH;  Surgeon: Avel Peace, MD;  Location: [facility];  Service: General;  Laterality: N/A;  . MICROLARYNGOSCOPY N/A 11/09/2019   Procedure:  MICROLARYNGOSCOPY;  Surgeon: [name], MD;  Location: [facility] OR;  Service: ENT;  Laterality: N/A;  . TRACHEOSTOMY TUBE PLACEMENT N/A 11/09/2019   Procedure: AWAKE TRACHEOSTOMY;  Surgeon: [name], MD;  Location: [facility] OR;  Service: ENT;  Laterality: N/A;  . UMBILICAL HERNIA REPAIR N/A 04/14/2016   Procedure: UMBILICAL HERNIA REPAIR WITH MESH;  Surgeon: Avel Peace, MD;  Location: [facility];  Service: General;  Laterality: N/A;    ROS: Review of Systems Negative except as stated above  PHYSICAL EXAM: BP 138/88   Pulse 93   Resp 16   Wt 183 lb 12.8 oz (83.4 kg)   SpO2 96%   BMI 24.25 kg/m   Physical Exam General appearance - alert, well appearing, older African-American male and in no distress Mental status - normal mood, behavior, speech, dress, motor activity, and thought processes Eyes - pupils equal and reactive, extraocular eye movements intact Neck -trach in place.  No drainage noted from the trach. Chest - clear to auscultation, no wheezes, rales or rhonchi, symmetric air entry Heart - normal rate, regular rhythm, normal S1, S2, no murmurs, rubs, clicks or gallops Extremities -no lower extremity edema. Depression screen [facility] 2/9 10/18/2019 11/29/2018 05/05/2018  Decreased Interest 0 0 0  Down, Depressed, Hopeless 0 0 0  PHQ - 2 Score 0 0 0  Altered sleeping - - 0  Tired, decreased energy - - 0  Change in appetite - - 0  Feeling bad or failure about yourself  - - 0  Trouble concentrating - - 0  Moving slowly or fidgety/restless - - 0  Suicidal thoughts - - 0  PHQ-9 Score - - 0    CMP Latest Ref Rng & Units 11/26/2019 11/18/2019 11/17/2019  Glucose 70 - 99 mg/dL 338(S) 505(L) 976(B)  BUN 8 - 23 mg/dL 15 9 10   Creatinine 0.61 - 1.24 mg/dL 3.41 9.37  Sodium 135 - 145 mmol/L 136 137 137  Potassium 3.5 - 5.1 mmol/L 4.6 4.0 4.6  Chloride 98 - 111 mmol/L 101 103 107  CO2 22 - 32 mmol/L 23 24 21(L)  Calcium 8.9 - 10.3 mg/dL 9.1 9.02) 4.0(X)  Total  Protein 6.5 - 8.1 g/dL - - -  Total Bilirubin 0.3 - 1.2 mg/dL - - -  Alkaline Phos 38 - 126 U/L - - -  AST 15 - 41 U/L - - -  ALT 0 - 44 U/L - - -   Lipid Panel     Component Value Date/Time   CHOL 162 10/18/2019 1509   TRIG 110 11/01/2019 0247   HDL 52 10/18/2019 1509   CHOLHDL 3.1 10/18/2019 1509   CHOLHDL 4.2 03/05/2016 0543   VLDL 12 03/05/2016 0543   LDLCALC 95 10/18/2019 1509    CBC    Component Value Date/Time   WBC 7.4 11/26/2019 1127   RBC 4.35 11/26/2019 1127   HGB 13.4 11/26/2019 1127   HGB 16.2 10/18/2019 1509   HCT 38.6 (L) 11/26/2019 1127   HCT 47.2 10/18/2019 1509   PLT 343 11/26/2019 1127   PLT 174 10/18/2019 1509   MCV 88.7 11/26/2019 1127   MCV 94 10/18/2019 1509   MCH 30.8 11/26/2019 1127   MCHC 34.7 11/26/2019 1127   RDW 12.3 11/26/2019 1127   RDW 13.5 10/18/2019 1509   LYMPHSABS 1.5 11/09/2019 1500   LYMPHSABS 1.1 10/06/2012 1452   MONOABS 0.7 11/09/2019 1500   EOSABS 0.1 11/09/2019 1500   EOSABS 0.0 10/06/2012 1452   BASOSABS 0.0 11/09/2019 1500   BASOSABS 0.0 10/06/2012 1452    ASSESSMENT AND PLAN: 1. Hospital discharge follow-up 2. Glottic stenosis 3. Tracheostomy present (HCC) -I empathized with the patient for what he has been through over the past 2 months medically and with the loss of income.  There was no way to have predicted that he would have such a significant reaction to the lisinopril. -I will have our caseworker touch base with him to help with completing his disability form.  I will certainly write a letter in support of him getting disability if unable to get long-term at least short-term. -He will keep his follow-up appointment with ENT and the trach clinic.  4. Presence of externally removable percutaneous endoscopic gastrostomy (PEG) tube [facility]) Keep follow-up appointment with general surgeon for removal of the PEG in 6 to 8 weeks.  5. Acute pulmonary embolism, unspecified pulmonary embolism type, unspecified whether  acute cor pulmonale present (HCC) Plan to continue Eliquis for about 6 months.  PE was most likely induced to immobility during first hospitalization  6. Essential hypertension Not at goal today but patient reports that readings at home have been good so no changes made with the carvedilol.  7. Depressed mood Spent some time with him allowing him to discuss his feelings and how much his life has been impacted over the past 2 months.  He declines referral to a counselor.  He states that he has good support and people that he can talk to when he feels down.  He declines any medication at this time.  8. Influenza vaccination declined This was offered.  Patient declined.   Patient was given the opportunity to ask questions.  Patient verbalized understanding of the plan and was able to repeat key elements of the plan.   No orders of the defined types were placed in this encounter.    Requested Prescriptions    No prescriptions requested or ordered in this encounter    Return in about 5 weeks (around 01/20/2020).  Jonah Blue, MD, FACP

## 2019-12-19 ENCOUNTER — Telehealth: Payer: Self-pay

## 2019-12-19 NOTE — Telephone Encounter (Signed)
This CM spoke to [facility] and explained patient's need for assistance with disability application.  She stated that if the patient is applying for short term disability, that he would apply through his employer. If he is applying for SSI, they may be able to help him but there is no guarantee. He has already been approved for medicaid.  A referral can be submitted to the [facility] with the patient's approval     Attempted to contact the patient regarding the assistance he needs for his disability application.  Call placed to # [phone], voicemail full, unable to leave a message.  Call placed to his sister, Brendan Gadson # [phone], message left with call back requested to this  CM.

## 2019-12-20 ENCOUNTER — Telehealth: Payer: Self-pay

## 2019-12-20 ENCOUNTER — Inpatient Hospital Stay (INDEPENDENT_AMBULATORY_CARE_PROVIDER_SITE_OTHER): Payer: Medicaid Other | Admitting: Primary Care

## 2019-12-20 NOTE — Telephone Encounter (Signed)
Pt returning your call.  He states he has come by the office but could not speak with anyone.  Would  Like call back at [phone]

## 2019-12-20 NOTE — Telephone Encounter (Signed)
Call placed to patient at the number he requested #  [phone].  The voice mail was full, unable to leave a message.   Need to discuss the assistance he needs for his disability application.  Want to inform him that Dr Laural Benes will have a letter ready for him to pick up Thursday, 12/22/2019 and when he comes to pick up the letter, this CM can meet with him about his disability questions.

## 2019-12-20 NOTE — Telephone Encounter (Signed)
Call received from the patient. He explained that he has already submitted his disability application and just needs a letter from Dr Laural Benes about his disability. He said he will then take the letter to [facility].   Dr Laural Benes  is aware of the need for the letter and  will have it ready for him Thursday, 12/22/2019, mid morning. He said he will come to the clinic at that time to pick it up.   He stated that he never received SSI. Marland Kitchen  Explained to him that if he is denied disability, a referral can be made to [employer] [place] who may be able to assist with an appeal.

## 2019-12-21 ENCOUNTER — Encounter: Payer: Self-pay | Admitting: Internal Medicine

## 2019-12-26 ENCOUNTER — Other Ambulatory Visit (HOSPITAL_COMMUNITY)
Admission: RE | Admit: 2019-12-26 | Discharge: 2019-12-26 | Disposition: A | Discharge status: Home / Self Care | Payer: Medicaid Other | Source: Ambulatory Visit | Attending: Internal Medicine | Admitting: Internal Medicine

## 2019-12-27 ENCOUNTER — Other Ambulatory Visit (HOSPITAL_COMMUNITY): Admit: 2019-12-27 | Payer: Medicaid Other

## 2019-12-27 ENCOUNTER — Other Ambulatory Visit (HOSPITAL_COMMUNITY): Payer: Medicaid Other

## 2019-12-29 ENCOUNTER — Ambulatory Visit (HOSPITAL_COMMUNITY)
Admission: RE | Admit: 2019-12-29 | Discharge: 2019-12-29 | Disposition: A | Discharge status: Home / Self Care | Payer: Medicaid Other | Source: Ambulatory Visit | Attending: Acute Care | Admitting: Acute Care

## 2019-12-29 ENCOUNTER — Other Ambulatory Visit: Payer: Self-pay | Admitting: Acute Care

## 2019-12-29 ENCOUNTER — Ambulatory Visit (HOSPITAL_COMMUNITY): Payer: Medicaid Other

## 2019-12-29 ENCOUNTER — Inpatient Hospital Stay (HOSPITAL_COMMUNITY): Admit: 2019-12-29 | Payer: Medicaid Other

## 2019-12-29 ENCOUNTER — Other Ambulatory Visit: Payer: Self-pay

## 2019-12-29 ENCOUNTER — Telehealth: Payer: Self-pay | Admitting: Acute Care

## 2019-12-29 DIAGNOSIS — Z930 Tracheostomy status: Secondary | ICD-10-CM | POA: Diagnosis not present

## 2019-12-29 DIAGNOSIS — I10 Essential (primary) hypertension: Secondary | ICD-10-CM | POA: Diagnosis not present

## 2019-12-29 DIAGNOSIS — J398 Other specified diseases of upper respiratory tract: Secondary | ICD-10-CM | POA: Insufficient documentation

## 2019-12-29 DIAGNOSIS — Z86711 Personal history of pulmonary embolism: Secondary | ICD-10-CM | POA: Diagnosis not present

## 2019-12-29 DIAGNOSIS — Z430 Encounter for attention to tracheostomy: Secondary | ICD-10-CM | POA: Diagnosis present

## 2019-12-29 NOTE — Telephone Encounter (Signed)
Called and spoke to [facility] with [employer] and she did confirm he is a pt of [employer]. Order placed. Nothing further needed at this time.  Will forward to [facility] as FYI as this is an urgent order.    Simonne Martinet, NP  P Lbpu Triage Pool Hey guys   I need to order the following from his DME   -Please order [phone]  (size 4 reusable inner canula shiley); # 2 (refill as needed)  -# 60 trach care kits (to allow for trach care twice a day) refill as needed  -# 60 trach care drain sponges.   He is out of supplies so needs them ASAP  Apparently someone came to house (think [employer]) took inventory but never got stuff delivered   [place]

## 2019-12-29 NOTE — Progress Notes (Signed)
Tracheostomy clinic note:  Reason for visit: Plan tracheostomy change and initial tracheostomy clinic visit  HPI This is a pleasant 59 year old black male who presents to the tracheostomy clinic status post hospital discharge.  He has a significant history of hypertension, pancreatitis, EtOH abuse, aneurysms, angioedema, and remote pulmonary emboli.  He was recently admitted on August 07/2019 with acute respiratory failure in the setting of subglottic stenosis, with initial ENT evaluation noting "immobile vocal cords with only slit of glottic space noted".  He underwent emergent tracheostomy.  Additional history: Had been admitted for angioedema requiring intubation from 7/15-7/18, readmitted 7/26 with shortness of breath and aspiration pneumonia requiring intubation from 7/27-7/29.  He was discharged from the hospital on 9/4 he presents to the tracheostomy clinic today for routine follow-up following inpatient stay  Review of systems: General no fever, chills, sick exposures. HEENT: No headache, no sinus pressure, no sinus drainage, no neck discomfort, no increase in tracheostomy output, no foul tracheostomy output, no postnasal drip, no cough. Pulmonary: Does have exertional shortness of breath, no significant cough, no wheezing.  In regards to his shortness of breath he does have shortness of breath if he is tracheostomy Passy-Muir valve is in place, he gets by this by keeping PMV off.  He does notice rather strong rush of air if he keeps tracheostomy talking valve in place for extended period of time, the force is strong enough to blow his PMV off from trach. Cardiac: No chest pain, palpitations, dizziness, or lightheadedness. Abdomen: No nausea vomiting diarrhea tolerating diet says he is "eating everything" GU: No issues Endocrine: No issues Neuromuscular: No issues   Physical exam: Vital signs reviewed and stable, pulse oximetry mid 90s on room air  General: This is a very  pleasant 59 year old black male he is sitting upright in the chair, he ambulated into the tracheostomy clinic without exertional shortness of breath HEENT normocephalic atraumatic.  He initially had size 6 uncuffed tracheostomy in place.  He is able to phonate with PMV in place, however does have a significant back pressure if keeps in place for extended period of time.  He also gets worsening shortness of breath with PMV in place for extended period of time.  There is no significant tracheal discharge and no cough.  No tracheal drainage.  Tracheostomy stoma site does exhibit some circumferential tissue around the tracheostomy stoma Pulmonary: Clear to auscultation without accessory use Cardiac: Regular rate and rhythm Abdomen: Soft nontender no organomegaly Extremities: Warm dry equal strength Neuro: Awake oriented no focal deficits  Procedure: Tracheostomy change  The patient's existing tracheostomy was removed at bedside without difficulty.  The tracheostomy stoma was prepped with sterile lidocaine jelly, a new size 4 tracheostomy cuffless was placed without difficulty.  End-tidal CO2 was checked, also check PMV tolerance.  He did show some moderate improvement with smaller PMV, however still has significant backflow  Impression/plan  1.  Tracheostomy dependence 2.  Subglottic/tracheal stenosis  Discussion Mr. Meech has follow-up planned in ENT towards the end of the month.  At least from my examination I would not recommend decannulation without the direction from ENT.  I think he would benefit from repeat direct visualization of the upper airway, and trachea.  I suspect he has significant narrowing as evidenced by his physical exam and might be a cannulation for dilation or even laser procedure to help with excess granulation tissue and tracheal obstruction.  I think this must be addressed and deemed stable post procedure if offered before  we can even consider proceeding with  decannulation  Plan Mr. Jupin was encouraged to keep his ENT follow-up I will not decannulate him without direct collaboration with ENT Continue size 4 cuffless tracheostomy and encourage PMV as much as can tolerate I will see him again in 8 weeks for planned routine tracheostomy care  My time 32 minutes  Simonne Martinet ACNP-BC [facility] Pulmonary/Critical Care Pager # [phone] OR # [phone] if no answer

## 2019-12-29 NOTE — Telephone Encounter (Signed)
I am sending the order high priority to [employer].  Nothing further needed.

## 2019-12-29 NOTE — Progress Notes (Signed)
Tracheostomy Procedure Note  Marco Pittman [id number] April 11, 1960  Pre Procedure Tracheostomy Information  Trach Brand: Shiley Size: 6.0 Style: Uncuffed Secured by: Velcro   Procedure: Trach change and trach cleaning    Post Procedure Tracheostomy Information  Trach Brand: Shiley Size: 4.0 Style: Uncuffed Secured by: Velcro   Post Procedure Evaluation:  ETCO2 positive color change from yellow to purple : Yes.   Vital signs:VVS Patients current condition: stable Complications: No apparent complications Trach site exam: dry and clean Wound care done: 4 x 4 gauze Patient did tolerate procedure well.   Education: none  Prescription needs: Ordered by Amado Nash and sent to [facility]    Additional needs: None Pt given an additional new style Shiley 4 Cuffless trach  [id number] Pt also given a new PMV

## 2019-12-30 DIAGNOSIS — J9601 Acute respiratory failure with hypoxia: Secondary | ICD-10-CM | POA: Diagnosis not present

## 2019-12-30 DIAGNOSIS — Z930 Tracheostomy status: Secondary | ICD-10-CM | POA: Diagnosis not present

## 2019-12-30 DIAGNOSIS — J9809 Other diseases of bronchus, not elsewhere classified: Secondary | ICD-10-CM | POA: Diagnosis not present

## 2019-12-30 DIAGNOSIS — I160 Hypertensive urgency: Secondary | ICD-10-CM | POA: Diagnosis not present

## 2019-12-30 DIAGNOSIS — J386 Stenosis of larynx: Secondary | ICD-10-CM | POA: Diagnosis not present

## 2020-01-17 MED FILL — URSODIOL 500 MG TABLET: 500 | 30 days supply | Qty: 90 | Fill #0

## 2020-01-19 DIAGNOSIS — Z931 Gastrostomy status: Secondary | ICD-10-CM | POA: Diagnosis not present

## 2020-02-06 ENCOUNTER — Other Ambulatory Visit: Payer: Self-pay | Admitting: *Deleted

## 2020-02-06 NOTE — Patient Outreach (Signed)
Care Coordination  02/06/2020  Marco Pittman Jul 25, 1960 [id number]   An unsuccessful telephone outreach was attempted today. The patient was referred to the case management team for assistance with care management and care coordination.   Follow Up Plan: The Managed Medicaid care management team will reach out to the patient again over the next 7-14 days.   Estanislado Emms RN, BSN [place]  [employer]

## 2020-02-06 NOTE — Patient Instructions (Signed)
Visit Information  Mr. FALCON MCCASKEY  - as a part of your Medicaid benefit, you are eligible for care management and care coordination services at no cost or copay. I was unable to reach you by phone today but would be happy to help you with your health related needs. Please feel free to call me @ [phone].   A member of the Managed Medicaid care management team will reach out to you again over the next 7-14 days.   Estanislado Emms RN, BSN [place]   [employer]

## 2020-02-13 ENCOUNTER — Other Ambulatory Visit (HOSPITAL_COMMUNITY)
Admission: RE | Admit: 2020-02-13 | Discharge: 2020-02-13 | Disposition: A | Discharge status: Home / Self Care | Payer: Medicaid Other | Source: Ambulatory Visit | Attending: Acute Care | Admitting: Acute Care

## 2020-02-13 ENCOUNTER — Other Ambulatory Visit: Payer: Self-pay

## 2020-02-13 ENCOUNTER — Inpatient Hospital Stay (HOSPITAL_COMMUNITY): Payer: Medicaid Other

## 2020-02-13 ENCOUNTER — Emergency Department (HOSPITAL_COMMUNITY): Payer: Medicaid Other

## 2020-02-13 ENCOUNTER — Encounter (HOSPITAL_COMMUNITY): Payer: Self-pay | Admitting: *Deleted

## 2020-02-13 ENCOUNTER — Inpatient Hospital Stay (HOSPITAL_COMMUNITY)
Admission: EM | Admit: 2020-02-13 | Discharge: 2020-03-07 | DRG: 296 | Disposition: E | Payer: Medicaid Other | Attending: Critical Care Medicine | Admitting: Critical Care Medicine

## 2020-02-13 DIAGNOSIS — R778 Other specified abnormalities of plasma proteins: Secondary | ICD-10-CM | POA: Diagnosis present

## 2020-02-13 DIAGNOSIS — I10 Essential (primary) hypertension: Secondary | ICD-10-CM | POA: Diagnosis present

## 2020-02-13 DIAGNOSIS — J439 Emphysema, unspecified: Secondary | ICD-10-CM | POA: Diagnosis present

## 2020-02-13 DIAGNOSIS — N179 Acute kidney failure, unspecified: Secondary | ICD-10-CM | POA: Diagnosis present

## 2020-02-13 DIAGNOSIS — E872 Acidosis: Secondary | ICD-10-CM | POA: Diagnosis present

## 2020-02-13 DIAGNOSIS — Z20822 Contact with and (suspected) exposure to covid-19: Secondary | ICD-10-CM | POA: Diagnosis present

## 2020-02-13 DIAGNOSIS — Z86711 Personal history of pulmonary embolism: Secondary | ICD-10-CM

## 2020-02-13 DIAGNOSIS — K7200 Acute and subacute hepatic failure without coma: Secondary | ICD-10-CM | POA: Diagnosis not present

## 2020-02-13 DIAGNOSIS — Z66 Do not resuscitate: Secondary | ICD-10-CM | POA: Diagnosis present

## 2020-02-13 DIAGNOSIS — R000 Tachycardia, unspecified: Secondary | ICD-10-CM | POA: Diagnosis not present

## 2020-02-13 DIAGNOSIS — Z9911 Dependence on respirator [ventilator] status: Secondary | ICD-10-CM | POA: Diagnosis not present

## 2020-02-13 DIAGNOSIS — R739 Hyperglycemia, unspecified: Secondary | ICD-10-CM | POA: Diagnosis present

## 2020-02-13 DIAGNOSIS — J9602 Acute respiratory failure with hypercapnia: Secondary | ICD-10-CM | POA: Diagnosis present

## 2020-02-13 DIAGNOSIS — I469 Cardiac arrest, cause unspecified: Principal | ICD-10-CM | POA: Diagnosis present

## 2020-02-13 DIAGNOSIS — Z01812 Encounter for preprocedural laboratory examination: Secondary | ICD-10-CM | POA: Insufficient documentation

## 2020-02-13 DIAGNOSIS — F17290 Nicotine dependence, other tobacco product, uncomplicated: Secondary | ICD-10-CM | POA: Diagnosis present

## 2020-02-13 DIAGNOSIS — J9601 Acute respiratory failure with hypoxia: Secondary | ICD-10-CM | POA: Diagnosis present

## 2020-02-13 DIAGNOSIS — F1010 Alcohol abuse, uncomplicated: Secondary | ICD-10-CM | POA: Diagnosis present

## 2020-02-13 DIAGNOSIS — R7401 Elevation of levels of liver transaminase levels: Secondary | ICD-10-CM | POA: Diagnosis present

## 2020-02-13 DIAGNOSIS — J386 Stenosis of larynx: Secondary | ICD-10-CM | POA: Diagnosis present

## 2020-02-13 DIAGNOSIS — K861 Other chronic pancreatitis: Secondary | ICD-10-CM | POA: Diagnosis present

## 2020-02-13 DIAGNOSIS — Z930 Tracheostomy status: Secondary | ICD-10-CM

## 2020-02-13 DIAGNOSIS — R253 Fasciculation: Secondary | ICD-10-CM | POA: Diagnosis present

## 2020-02-13 DIAGNOSIS — G931 Anoxic brain damage, not elsewhere classified: Secondary | ICD-10-CM | POA: Diagnosis present

## 2020-02-13 DIAGNOSIS — R578 Other shock: Secondary | ICD-10-CM | POA: Diagnosis not present

## 2020-02-13 DIAGNOSIS — Z79899 Other long term (current) drug therapy: Secondary | ICD-10-CM

## 2020-02-13 DIAGNOSIS — Z7901 Long term (current) use of anticoagulants: Secondary | ICD-10-CM

## 2020-02-13 DIAGNOSIS — Z888 Allergy status to other drugs, medicaments and biological substances status: Secondary | ICD-10-CM

## 2020-02-13 DIAGNOSIS — E875 Hyperkalemia: Secondary | ICD-10-CM | POA: Diagnosis present

## 2020-02-13 DIAGNOSIS — K921 Melena: Secondary | ICD-10-CM | POA: Diagnosis not present

## 2020-02-13 DIAGNOSIS — Z8679 Personal history of other diseases of the circulatory system: Secondary | ICD-10-CM

## 2020-02-13 DIAGNOSIS — Z4659 Encounter for fitting and adjustment of other gastrointestinal appliance and device: Secondary | ICD-10-CM

## 2020-02-13 LAB — COMPREHENSIVE METABOLIC PANEL
ALT: 227 U/L — ABNORMAL HIGH (ref 0–44)
AST: 311 U/L — ABNORMAL HIGH (ref 15–41)
Albumin: 3.3 g/dL — ABNORMAL LOW (ref 3.5–5.0)
Alkaline Phosphatase: 86 U/L (ref 38–126)
Anion gap: 25 — ABNORMAL HIGH (ref 5–15)
BUN: 9 mg/dL (ref 8–23)
CO2: 12 mmol/L — ABNORMAL LOW (ref 22–32)
Calcium: 9.3 mg/dL (ref 8.9–10.3)
Chloride: 107 mmol/L (ref 98–111)
Creatinine, Ser: 1.74 mg/dL — ABNORMAL HIGH (ref 0.61–1.24)
GFR, Estimated: 44 mL/min — ABNORMAL LOW (ref >60)
Glucose, Bld: 90 mg/dL (ref 70–99)
Potassium: 6.1 mmol/L — ABNORMAL HIGH (ref 3.5–5.1)
Sodium: 144 mmol/L (ref 135–145)
Total Bilirubin: 0.9 mg/dL (ref 0.3–1.2)
Total Protein: 7.1 g/dL (ref 6.5–8.1)

## 2020-02-13 LAB — BASIC METABOLIC PANEL
Anion gap: 24 — ABNORMAL HIGH (ref 5–15)
Anion gap: 23 — ABNORMAL HIGH (ref 5–15)
BUN: 13 mg/dL (ref 8–23)
BUN: 18 mg/dL (ref 8–23)
CO2: 15 mmol/L — ABNORMAL LOW (ref 22–32)
CO2: 15 mmol/L — ABNORMAL LOW (ref 22–32)
Calcium: 8.3 mg/dL — ABNORMAL LOW (ref 8.9–10.3)
Calcium: 7.9 mg/dL — ABNORMAL LOW (ref 8.9–10.3)
Chloride: 102 mmol/L (ref 98–111)
Chloride: 102 mmol/L (ref 98–111)
Creatinine, Ser: 1.88 mg/dL — ABNORMAL HIGH (ref 0.61–1.24)
Creatinine, Ser: 2.38 mg/dL — ABNORMAL HIGH (ref 0.61–1.24)
GFR, Estimated: 40 mL/min — ABNORMAL LOW (ref >60)
GFR, Estimated: 30 mL/min — ABNORMAL LOW (ref >60)
Glucose, Bld: 151 mg/dL — ABNORMAL HIGH (ref 70–99)
Glucose, Bld: 234 mg/dL — ABNORMAL HIGH (ref 70–99)
Potassium: 6.5 mmol/L (ref 3.5–5.1)
Potassium: 4.6 mmol/L (ref 3.5–5.1)
Sodium: 141 mmol/L (ref 135–145)
Sodium: 140 mmol/L (ref 135–145)

## 2020-02-13 LAB — I-STAT ARTERIAL BLOOD GAS, ED
Acid-base deficit: 15 mmol/L — ABNORMAL HIGH (ref 0.0–2.0)
Bicarbonate: 13.9 mmol/L — ABNORMAL LOW (ref 20.0–28.0)
Calcium, Ion: 1.16 mmol/L (ref 1.15–1.40)
HCT: 30 % — ABNORMAL LOW (ref 39.0–52.0)
Hemoglobin: 10.2 g/dL — ABNORMAL LOW (ref 13.0–17.0)
O2 Saturation: 100 %
Patient temperature: 96.1
Potassium: 7.2 mmol/L (ref 3.5–5.1)
Sodium: 139 mmol/L (ref 135–145)
TCO2: 15 mmol/L — ABNORMAL LOW (ref 22–32)
pCO2 arterial: 44.2 mmHg (ref 32.0–48.0)
pH, Arterial: 7.097 — CL (ref 7.350–7.450)
pO2, Arterial: 400 mmHg — ABNORMAL HIGH (ref 83.0–108.0)

## 2020-02-13 LAB — I-STAT CHEM 8, ED
BUN: 11 mg/dL (ref 8–23)
Calcium, Ion: 1.09 mmol/L — ABNORMAL LOW (ref 1.15–1.40)
Chloride: 110 mmol/L (ref 98–111)
Creatinine, Ser: 1.5 mg/dL — ABNORMAL HIGH (ref 0.61–1.24)
Glucose, Bld: 82 mg/dL (ref 70–99)
HCT: 31 % — ABNORMAL LOW (ref 39.0–52.0)
Hemoglobin: 10.5 g/dL — ABNORMAL LOW (ref 13.0–17.0)
Potassium: 5.7 mmol/L — ABNORMAL HIGH (ref 3.5–5.1)
Sodium: 143 mmol/L (ref 135–145)
TCO2: 12 mmol/L — ABNORMAL LOW (ref 22–32)

## 2020-02-13 LAB — HEMOGLOBIN A1C
Hgb A1c MFr Bld: 5.5 % (ref 4.8–5.6)
Mean Plasma Glucose: 111.15 mg/dL

## 2020-02-13 LAB — POCT I-STAT 7, (LYTES, BLD GAS, ICA,H+H)
Acid-base deficit: 8 mmol/L — ABNORMAL HIGH (ref 0.0–2.0)
Bicarbonate: 16.7 mmol/L — ABNORMAL LOW (ref 20.0–28.0)
Calcium, Ion: 0.9 mmol/L — ABNORMAL LOW (ref 1.15–1.40)
HCT: 44 % (ref 39.0–52.0)
Hemoglobin: 15 g/dL (ref 13.0–17.0)
O2 Saturation: 99 %
Patient temperature: 96.1
Potassium: 5.7 mmol/L — ABNORMAL HIGH (ref 3.5–5.1)
Sodium: 140 mmol/L (ref 135–145)
TCO2: 18 mmol/L — ABNORMAL LOW (ref 22–32)
pCO2 arterial: 29.4 mmHg — ABNORMAL LOW (ref 32.0–48.0)
pH, Arterial: 7.355 (ref 7.350–7.450)
pO2, Arterial: 114 mmHg — ABNORMAL HIGH (ref 83.0–108.0)

## 2020-02-13 LAB — GLUCOSE, CAPILLARY
Glucose-Capillary: 14 mg/dL — CL (ref 70–99)
Glucose-Capillary: 162 mg/dL — ABNORMAL HIGH (ref 70–99)
Glucose-Capillary: 185 mg/dL — ABNORMAL HIGH (ref 70–99)
Glucose-Capillary: 88 mg/dL (ref 70–99)

## 2020-02-13 LAB — CBC
HCT: 34.6 % — ABNORMAL LOW (ref 39.0–52.0)
Hemoglobin: 10.6 g/dL — ABNORMAL LOW (ref 13.0–17.0)
MCH: 29.4 pg (ref 26.0–34.0)
MCHC: 30.6 g/dL (ref 30.0–36.0)
MCV: 96.1 fL (ref 80.0–100.0)
Platelets: 177 10*3/uL (ref 150–400)
RBC: 3.6 MIL/uL — ABNORMAL LOW (ref 4.22–5.81)
RDW: 12.8 % (ref 11.5–15.5)
WBC: 6.9 10*3/uL (ref 4.0–10.5)
nRBC: 0 % (ref 0.0–0.2)

## 2020-02-13 LAB — MRSA PCR SCREENING: MRSA by PCR: NEGATIVE

## 2020-02-13 LAB — TROPONIN I (HIGH SENSITIVITY)
Troponin I (High Sensitivity): 215 ng/L (ref <18)
Troponin I (High Sensitivity): 3120 ng/L (ref <18)

## 2020-02-13 LAB — SARS CORONAVIRUS 2 (TAT 6-24 HRS): SARS Coronavirus 2: NEGATIVE

## 2020-02-13 LAB — RESPIRATORY PANEL BY RT PCR (FLU A&B, COVID)
Influenza A by PCR: NEGATIVE
Influenza B by PCR: NEGATIVE
SARS Coronavirus 2 by RT PCR: NEGATIVE

## 2020-02-13 LAB — BRAIN NATRIURETIC PEPTIDE: B Natriuretic Peptide: 239.4 pg/mL — ABNORMAL HIGH (ref 0.0–100.0)

## 2020-02-13 LAB — LACTIC ACID, PLASMA
Lactic Acid, Venous: 10.5 mmol/L (ref 0.5–1.9)
Lactic Acid, Venous: 7.4 mmol/L (ref 0.5–1.9)

## 2020-02-13 MED ORDER — LACTATED RINGERS IV BOLUS
1000.0000 mL | Freq: Once | INTRAVENOUS | Status: AC
  Administered 2020-02-13: 1000 mL via INTRAVENOUS

## 2020-02-13 MED ORDER — APIXABAN 5 MG PO TABS: 5.0000 mg | ORAL_TABLET | Freq: Two times a day (BID) | ORAL | Status: DC

## 2020-02-13 MED ORDER — DOCUSATE SODIUM 50 MG/5ML PO LIQD: 100.0000 mg | Freq: Two times a day (BID) | ORAL | Status: DC | PRN

## 2020-02-13 MED ORDER — IOHEXOL 350 MG/ML SOLN
60.0000 mL | Freq: Once | INTRAVENOUS | Status: AC | PRN
  Administered 2020-02-13: 60 mL via INTRAVENOUS

## 2020-02-13 MED ORDER — POLYETHYLENE GLYCOL 3350 17 G PO PACK: 17.0000 g | PACK | Freq: Every day | ORAL | Status: DC | PRN

## 2020-02-13 MED ORDER — ACETAMINOPHEN 650 MG RE SUPP: 650.0000 mg | RECTAL | Status: DC

## 2020-02-13 MED ORDER — FENTANYL CITRATE (PF) 100 MCG/2ML IJ SOLN
25.0000 ug | INTRAMUSCULAR | Status: DC | PRN
  Filled 2020-02-13: qty 2

## 2020-02-13 MED ORDER — DEXTROSE 50 % IV SOLN
INTRAVENOUS | Status: AC
  Filled 2020-02-13: qty 50

## 2020-02-13 MED ORDER — SODIUM BICARBONATE 8.4 % IV SOLN
Freq: Once | INTRAVENOUS | Status: AC
  Filled 2020-02-13: qty 850

## 2020-02-13 MED ORDER — SODIUM BICARBONATE 8.4 % IV SOLN
50.0000 meq | Freq: Once | INTRAVENOUS | Status: AC
  Administered 2020-02-13: 50 meq via INTRAVENOUS
  Filled 2020-02-13: qty 50

## 2020-02-13 MED ORDER — ACETAMINOPHEN 160 MG/5ML PO SOLN: 650.0000 mg | ORAL | Status: DC

## 2020-02-13 MED ORDER — DOCUSATE SODIUM 100 MG PO CAPS: 100.0000 mg | ORAL_CAPSULE | Freq: Two times a day (BID) | ORAL | Status: DC | PRN

## 2020-02-13 MED ORDER — MIDAZOLAM HCL 2 MG/2ML IJ SOLN: 2.0000 mg | INTRAMUSCULAR | Status: DC | PRN

## 2020-02-13 MED ORDER — PANTOPRAZOLE SODIUM 40 MG IV SOLR
40.0000 mg | Freq: Every day | INTRAVENOUS | Status: DC
  Administered 2020-02-13: 40 mg via INTRAVENOUS
  Filled 2020-02-13: qty 40

## 2020-02-13 MED ORDER — SODIUM CHLORIDE 0.9 % IV SOLN
3.0000 g | Freq: Four times a day (QID) | INTRAVENOUS | Status: DC
  Administered 2020-02-13: 3 g via INTRAVENOUS
  Filled 2020-02-13 (×4): qty 8

## 2020-02-13 MED ORDER — CALCIUM GLUCONATE-NACL 1-0.675 GM/50ML-% IV SOLN
1.0000 g | Freq: Once | INTRAVENOUS | Status: AC
  Administered 2020-02-13: 1000 mg via INTRAVENOUS
  Filled 2020-02-13: qty 50

## 2020-02-13 MED ORDER — ACETAMINOPHEN 325 MG PO TABS
650.0000 mg | ORAL_TABLET | ORAL | Status: DC
  Administered 2020-02-13: 650 mg via ORAL
  Filled 2020-02-13: qty 2

## 2020-02-13 MED ORDER — DEXTROSE 50 % IV SOLN
1.0000 | Freq: Once | INTRAVENOUS | Status: AC
  Administered 2020-02-13: 50 mL via INTRAVENOUS

## 2020-02-13 MED ORDER — FENTANYL CITRATE (PF) 100 MCG/2ML IJ SOLN
50.0000 ug | Freq: Once | INTRAMUSCULAR | Status: AC
  Administered 2020-02-13: 50 ug via INTRAVENOUS
  Filled 2020-02-13: qty 2

## 2020-02-13 MED ORDER — INSULIN ASPART 100 UNIT/ML IV SOLN
5.0000 [IU] | Freq: Once | INTRAVENOUS | Status: AC
  Administered 2020-02-13: 5 [IU] via INTRAVENOUS

## 2020-02-13 MED ORDER — MIDAZOLAM HCL 2 MG/2ML IJ SOLN
2.0000 mg | INTRAMUSCULAR | Status: DC | PRN
  Administered 2020-02-13: 2 mg via INTRAVENOUS
  Filled 2020-02-13: qty 2

## 2020-02-13 MED ORDER — INSULIN ASPART 100 UNIT/ML ~~LOC~~ SOLN: 0.0000 [IU] | SUBCUTANEOUS | Status: DC

## 2020-02-13 MED ORDER — ASPIRIN 300 MG RE SUPP: 300.0000 mg | Freq: Every day | RECTAL | Status: DC

## 2020-02-13 MED ORDER — INSULIN ASPART 100 UNIT/ML IV SOLN: 10.0000 [IU] | Freq: Once | INTRAVENOUS | Status: DC

## 2020-02-13 MED ORDER — DEXTROSE 50 % IV SOLN
1.0000 | Freq: Once | INTRAVENOUS | Status: DC
  Filled 2020-02-13: qty 50

## 2020-02-13 MED ORDER — FENTANYL CITRATE (PF) 100 MCG/2ML IJ SOLN
25.0000 ug | INTRAMUSCULAR | Status: DC | PRN
  Administered 2020-02-13: 25 ug via INTRAVENOUS

## 2020-02-13 MED ORDER — CALCIUM GLUCONATE 10 % IV SOLN: 1.0000 g | Freq: Once | INTRAVENOUS | Status: DC

## 2020-02-13 NOTE — Progress Notes (Signed)
Notified E-link potassium critical of 6.5.

## 2020-02-13 NOTE — Progress Notes (Signed)
Notified E-link of new tachycardia 128BPM, as well as vent dysynchrony not responding to PRN Versed. Also notified of poor UOP.

## 2020-02-13 NOTE — ED Triage Notes (Signed)
Patient presents to ED via [facility] states patient went to the [employer] [facility] this am with c/o not breathing well and wanted his trach changed, however per ems  They were unabl to change his trach because he needed a covid test first, so patient went back home was sitting on the front porch talking with friends and collapsed. ema started CPR at 1304 patient has 5 epip was defib. X 2 and reuturn of rosc at 1337. Upon arrival to ed patient is unresp.

## 2020-02-13 NOTE — Procedures (Signed)
Patient Name: EVER GUSTAFSON  MRN: [id number]  Epilepsy Attending: Charlsie Quest  Referring Physician/Provider: Dr Karie Fetch Date: 02/27/2020  Duration: 23.68mins  Patient history: 59yo M s/p cardiac arrest. EEG to evaluate for seizure  Level of alertness:  comatose  AEDs during EEG study: None  Technical aspects: This EEG study was done with scalp electrodes positioned according to the 10-20 International system of electrode placement. Electrical activity was acquired at a sampling rate of 500Hz  and reviewed with a high frequency filter of 70Hz  and a low frequency filter of 1Hz . EEG data were recorded continuously and digitally stored.   Description: EEG showed continuous generalized background attenuation. Patient was noted to have left cheek/face rhythmic twitching. Concomitant eeg before, during and after the event didn't show any eeg change.  Hyperventilation and photic stimulation were not performed.     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 Episodes of left facial/cheek twitching were noted without concomitant eeg change. However, focal motor seizures may not be seen on scalp eeg. Therefore, clinical correlation is recommended. No epileptiform discharges were seen throughout the recording.  Jaana Brodt 

## 2020-02-13 NOTE — Progress Notes (Signed)
EKG completed: Sinus Tachycardia with prolong QT. Placed on chart.

## 2020-02-13 NOTE — Progress Notes (Signed)
eLink Physician-Brief Progress Note Patient Name: Marco Pittman DOB: [birth year] MRN: [id number]   Date of Service  March 01, 2020  HPI/Events of Note  Notified of K 6.5 No rhythm nor any EKG change  eICU Interventions  Ordered D50/ insulin 5 units and bicarb push Already on bicarb drip at 250 cc/hr     Intervention Category Major Interventions: Electrolyte abnormality - evaluation and management  Darl Pikes 03/01/20, 8:29 PM

## 2020-02-13 NOTE — Progress Notes (Addendum)
ANTICOAGULATION CONSULT NOTE - Initial Consult  Pharmacy Consult for Apixaban Indication: Hx of PE  Allergies  Allergen Reactions  . Lisinopril Swelling    Angioedema    Patient Measurements: Height: 6\' 1"  (185.4 cm) Weight: 83.4 kg (183 lb 13.8 oz) IBW/kg (Calculated) : 79.9  Vital Signs: Temp: 96.1 F (35.6 C) (11/08 1422) Temp Source: Temporal (11/08 1422) BP: 99/80 (11/08 1715) Pulse Rate: 59 (11/08 1616)  Labs: Recent Labs    03-08-20 1446 03/08/20 1446 2020-03-08 1457 03-08-20 1513  HGB 10.6*   < > 10.5* 10.2*  HCT 34.6*  --  31.0* 30.0*  PLT 177  --   --   --   CREATININE 1.74*  --  1.50*  --   TROPONINIHS 215*  --   --   --    < > = values in this interval not displayed.    Estimated Creatinine Clearance: 58.4 mL/min (A) (by C-G formula based on SCr of 1.5 mg/dL (H)).   Medical History: Past Medical History:  Diagnosis Date  . Aneurysm (HCC)   . ETOH abuse   . Hypertension   . Pancreatitis 03/04/2016  . Umbilical hernia    symptomatic umbilical hernia/notes 03/04/2016    Assessment: 59 yr old man with hx of PE (on apixaban 5 mg per tube BID PTA; family states pt was compliant with apixaban; per med rec, last dose unknown) was admitted 11/8 post cardiac arrest. Pharmacy was consulted to continue home apixaban for hx of PE (head CT this afternoon showed no acute intracranial abnormality); chest CT showed no acute or residual pulmonary embolus.  H/H 10.2/30.0, platelets 177; Scr 1.50 (up from 0.76 in 8/21). Spoke with RN this evening; he reported that pt is having bleeding from [facility] this evening; he has reached out to e-link provider re: whether the apixaban should be held (evening apixaban dose has not been given at this point).  Goal of Therapy:  Prevention of PE recurrence Monitor platelets by anticoagulation protocol: Yes   Plan:  Continue home regimen of apixaban 5 mg per tube BID if e-link provider approves, given bleeding from  Flexiseal Monitor CBC Monitor for signs/symptoms of bleeding  [facility], PharmD, BCPS, [facility] Clinical Pharmacist 2020-03-08,6:47 PM

## 2020-02-13 NOTE — Procedures (Signed)
Tracheostomy Exchange Procedure Note  Marco Pittman  [id number]  [birth year]  Date:02/16/2020  Time:5:27 PM   Provider Performing:Gurley Climer Audrie Lia   Procedure: Tracheostomy Exchange Through Immature Stoma (25427)  Indication(s) Mechanical ventilation  Consent Unable to obtain consent due to emergent nature of procedure.  Anesthesia None   Time Out Verified patient identification, verified procedure, site/side was marked, verified correct patient position, special equipment/implants available, medications/allergies/relevant history reviewed, required imaging and test results available.   Sterile Technique Hand hygiene, gloves   Procedure Description Size #6 uncuffed existing Shiley removed and size #6 cuffed Shiley placed through stoma.   Complications/Tolerance None; patient tolerated the procedure well..   EBL Minimal   Steffanie Dunn, DO 03/03/2020 5:28 PM [facility]

## 2020-02-13 NOTE — ED Notes (Signed)
Pt's mother and sister at bedside talking to Dr. Tristan Schroeder colored ring given to pt's sister

## 2020-02-13 NOTE — Progress Notes (Signed)
Existing trach patient arrived via EMS post arrest.  The patient's trach is a #6 Shiley Cuffless.  RN and MD notified that the trach is cuffless and therefore the patient is not getting full return VTs.  A cuffed #6 Shiley trach is at bedside for a trach change.  MD notified.

## 2020-02-13 NOTE — Progress Notes (Signed)
Pt transported from the ER to CT and then 38M 06 on the ventilator without incident.

## 2020-02-13 NOTE — Progress Notes (Signed)
Notified E-link red drainage from flexiseal, asked if Eliquis should be held. Also asked for NGT confirmation order.

## 2020-02-13 NOTE — ED Provider Notes (Signed)
[facility] EMERGENCY DEPARTMENT Provider Note   CSN: [id number] Arrival date & time: 02/20/2020  1424     History Chief Complaint  Patient presents with  . Cardiac Arrest    Marco Pittman is a 59 y.o. male.  The history is provided by the EMS personnel.  Illness Location:  Witnessed collapse- suspect cardiac arrest Quality:  Pulseless on arrival of EMS, CPR, epi5, defib x2 with rosc  Severity:  Severe Onset quality:  Sudden Timing:  Constant Progression:  Unchanged Chronicity:  New Context:  Pt reporting SOB today, per friends, pt reportedly went to trach clinic to get exchange due to SOB and told could not without COVID testing, pt collapsed in front of friends      Past Medical History:  Diagnosis Date  . Aneurysm (HCC)   . ETOH abuse   . Hypertension   . Pancreatitis 03/04/2016  . Umbilical hernia    symptomatic umbilical hernia/notes 03/04/2016    Patient Active Problem List   Diagnosis Date Noted  . Tracheal stenosis   . Presence of externally removable percutaneous endoscopic gastrostomy (PEG) tube (HCC) 12/16/2019  . Tracheostomy status (HCC) 12/16/2019  . Acute pulmonary embolism (HCC) 12/16/2019  . Dysphagia   . 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 Hypertensive urgency 10/31/2019  . Acute respiratory failure with hypoxia (HCC) 10/31/2019  . Mucus plugging of bronchi   . Acute metabolic encephalopathy 10/26/2019  . Malnutrition of moderate degree 10/22/2019  . Angio-edema 10/20/2019  . Acute hypoxemic respiratory failure (HCC)   . Former smoker 06/16/2017  . 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 Sessile colonic polyp 01/29/2017  . Pre-diabetes 01/27/2017  . Chronic left shoulder pain 01/27/2017  . Weight loss, unintentional 10/10/2016  . History of alcohol abuse 03/19/2016  . Chronic pancreatitis (HCC) 03/04/2016  . Essential hypertension, benign 10/06/2012    Past Surgical History:  Procedure Laterality Date  .  GASTROSTOMY N/A 11/16/2019   Procedure: INSERTION OF GASTROSTOMY TUBE,;  Surgeon: Diamantina Monks, MD;  Location: [facility] OR;  Service: General;  Laterality: N/A;  . INSERTION OF MESH N/A 04/14/2016   Procedure: INSERTION OF MESH;  Surgeon: Avel Peace, MD;  Location: [facility];  Service: General;  Laterality: N/A;  . MICROLARYNGOSCOPY N/A 11/09/2019   Procedure: MICROLARYNGOSCOPY;  Surgeon: [name], MD;  Location: [facility] OR;  Service: ENT;  Laterality: N/A;  . TRACHEOSTOMY TUBE PLACEMENT N/A 11/09/2019   Procedure: AWAKE TRACHEOSTOMY;  Surgeon: [name], MD;  Location: [facility] OR;  Service: ENT;  Laterality: N/A;  . UMBILICAL HERNIA REPAIR N/A 04/14/2016   Procedure: UMBILICAL HERNIA REPAIR WITH MESH;  Surgeon: Avel Peace, MD;  Location: [facility];  Service: General;  Laterality: N/A;       Family History  Problem Relation Age of Onset  . Cancer Father 72       colon    Social History   Tobacco Use  . Smoking status: Current Every Day Smoker    Packs/day: 0.50    Years: 45.00    Pack years: 22.50    Types: Cigars  . Smokeless tobacco: Never Used  Substance Use Topics  . Alcohol use: Yes    Alcohol/week: 14.0 standard drinks    Types: 14 Cans of beer per week    Comment: last alcohol on 03/29/16  . Drug use: No    Home Medications Prior to Admission medications   Medication Sig Start Date End Date Taking? Authorizing Provider  amLODipine (NORVASC) 10 MG tablet Take 10 mg by mouth daily.   Yes [provider]  apixaban (ELIQUIS) 5 MG TABS tablet Place 1 tablet (5 mg total) into feeding tube 2 (two) times daily. 12/10/19  Yes Meredeth Ide, MD  carvedilol (COREG) 3.125 MG tablet Place 1 tablet (3.125 mg total) into feeding tube 2 (two) times daily with a meal. 12/10/19  Yes Sharl Ma, Sarina Ill, MD  glycopyrrolate (ROBINUL) 1 MG tablet Take 1 tablet (1 mg total) by mouth 3 (three) times daily as needed (Heavy secretions). 12/10/19  Yes Sharl Ma, Sarina Ill,  MD  guaiFENesin (ROBITUSSIN) 100 MG/5ML SOLN Place 10 mLs (200 mg total) into feeding tube every 4 (four) hours as needed for cough or to loosen phlegm. 12/10/19  Yes Meredeth Ide, MD  Multiple Vitamin (MULTIVITAMIN WITH MINERALS) TABS tablet Place 1 tablet into feeding tube daily. 11/17/19  Yes Almon Hercules, MD  ursodiol (ACTIGALL) 500 MG tablet Place 1 tablet (500 mg total) into feeding tube 3 (three) times daily. 11/17/19  Yes Almon Hercules, MD  thiamine 100 MG tablet Place 1 tablet (100 mg total) into feeding tube daily. Patient not taking: Reported on February 14, 2020 11/17/19   Almon Hercules, MD    Allergies    Lisinopril  Review of Systems   Review of Systems  Unable to perform ROS: Patient unresponsive    Physical Exam Updated Vital Signs BP 97/85   Pulse (!) 48   Temp (!) 96.1 F (35.6 C) (Temporal)   Resp (!) 7   Ht 6\' 1"  (1.854 m)   Wt 83.4 kg   SpO2 97%   BMI 24.26 kg/m   Physical Exam Vitals reviewed.  Constitutional:      Appearance: He is ill-appearing.  HENT:     Head: Normocephalic and atraumatic.     Mouth/Throat:     Pharynx: Oropharynx is clear.     Comments: Blood at base of lower front incisor  Eyes:     Comments: Eyes fixed, pupils equal 5 mm, non reactive  Neck:     Comments: Trach in place Cardiovascular:     Comments: Diminished thready pulses Pulmonary:     Breath sounds: No wheezing or rhonchi.     Comments: Decreased breath sounds at apex Abdominal:     General: Abdomen is flat.     Palpations: Abdomen is soft.  Musculoskeletal:     Right lower leg: No edema.     Left lower leg: No edema.  Skin:    General: Skin is dry.     Comments: Cool distal extremities  Neurological:     GCS: GCS eye subscore is 1. GCS verbal subscore is 1. GCS motor subscore is 1.     ED Results / Procedures / Treatments   Labs (all labs ordered are listed, but only abnormal results are displayed) Labs Reviewed  COMPREHENSIVE METABOLIC PANEL - Abnormal; Notable  for the following components:      Result Value   Potassium 6.1 (*)    CO2 12 (*)    Creatinine, Ser 1.74 (*)    Albumin 3.3 (*)    GFR, Estimated 44 (*)    Anion gap 25 (*)    All other components within normal limits  CBC - Abnormal; Notable for the following components:   RBC 3.60 (*)    Hemoglobin 10.6 (*)    HCT 34.6 (*)    All other components within normal limits  BRAIN NATRIURETIC  PEPTIDE - Abnormal; Notable for the following components:   B Natriuretic Peptide 239.4 (*)    All other components within normal limits  I-STAT CHEM 8, ED - Abnormal; Notable for the following components:   Potassium 5.7 (*)    Creatinine, Ser 1.50 (*)    Calcium, Ion 1.09 (*)    TCO2 12 (*)    Hemoglobin 10.5 (*)    HCT 31.0 (*)    All other components within normal limits  I-STAT ARTERIAL BLOOD GAS, ED - Abnormal; Notable for the following components:   pH, Arterial 7.097 (*)    pO2, Arterial 400 (*)    Bicarbonate 13.9 (*)    TCO2 15 (*)    Acid-base deficit 15.0 (*)    Potassium 7.2 (*)    HCT 30.0 (*)    Hemoglobin 10.2 (*)    All other components within normal limits  TROPONIN I (HIGH SENSITIVITY) - Abnormal; Notable for the following components:   Troponin I (High Sensitivity) 215 (*)    All other components within normal limits  RESPIRATORY PANEL BY RT PCR (FLU A&B, COVID)  BLOOD GAS, ARTERIAL  URINALYSIS, ROUTINE W REFLEX MICROSCOPIC  RAPID URINE DRUG SCREEN, HOSP PERFORMED  TROPONIN I (HIGH SENSITIVITY)    EKG EKG Interpretation  Date/Time:  Monday February 13 2020 14:25:41 EST Ventricular Rate:  64 PR Interval:    QRS Duration: 123 QT Interval:  501 QTC Calculation: 517 R Axis:   48 Text Interpretation: Sinus rhythm Non-specific intra-ventricular conduction delay `marked st depression inferiorly and v3-v5 Confirmed by Cathren LaineSteinl, Kevin ([id number]) on 02/06/2020 2:44:46 PM   Radiology DG Chest Port 1 View  Result Date: 02/20/2020 CLINICAL DATA:  Status post cardiac  arrest, tracheostomy tube placement EXAM: PORTABLE CHEST 1 VIEW COMPARISON:  11/09/2019 FINDINGS: Tracheostomy tube is now seen in satisfactory position. Cardiac shadow is within normal limits. The lungs are well aerated. Patchy airspace opacity is noted in the right upper lobe along the minor fissure likely related to early infiltrate. No other focal abnormality is noted. IMPRESSION: Mild right upper lobe early infiltrate. Tracheostomy tube in satisfactory position. Electronically Signed   By: Alcide CleverMark  Lukens M.D.   On: 04/14/2019 14:59    Procedures Procedures (including critical care time)  Medications Ordered in ED Medications  sodium bicarbonate 150 mEq in dextrose 5 % 1,000 mL infusion (has no administration in time range)  sodium bicarbonate injection 50 mEq (has no administration in time range)  calcium gluconate 1 g/ 50 mL sodium chloride IVPB (has no administration in time range)  fentaNYL (SUBLIMAZE) injection 50 mcg (50 mcg Intravenous Given 02/13/20 1606)    ED Course  I have reviewed the triage vital signs and the nursing notes.  Pertinent labs & imaging results that were available during my care of the patient were reviewed by me and considered in my medical decision making (see chart for details).    MDM Rules/Calculators/A&P                          Medical Decision Making: Marco Pittman is a 59 y.o. male who presented to the ED today after witnessed cardiac arrest. Per EMS pt was with friends, complaining of SOB and slumped over, unresponsive, pulseless on EMS arrival, CPR started, 5 epi,  Defib x 2, with ROSC at 1337. Past medical history significant for hypertension, pancreatitis, EtOH abuse, aneurysms, angioedema, and remote pulmonary emboli, tracheostomy dependence (s/p subglottic/tracheal stenosis)  Reviewed  and confirmed nursing documentation for past medical history, family history, social history.  On my initial exam, the pt was unresponsive, pupils fixed, no  pain response (not under sedation), trach on vent, cool extremities, thready pulses, BL breath sounds, soft abdomen   Exam concerning for anoxic brain injury.  Consults: Critical care  Temp 96.31F PH 7.097, pCO2 44.2, bicarbonate 13.9 CBC K 5.7, Hgb 10.2   CXR without pneumothorax, Mild right upper lobe early infiltrate. Tracheostomy tube in satisfactory position.   All radiology and laboratory studies reviewed independently and with my attending physician, agree with reading provided by radiologist unless otherwise noted.   Based on the above findings, I believe patient requires admission to ICU.  The above care was discussed with and agreed upon by my attending physician.   Emergency Department Medication Summary:  Medications  sodium bicarbonate 150 mEq in dextrose 5 % 1,000 mL infusion (has no administration in time range)  sodium bicarbonate injection 50 mEq (has no administration in time range)  calcium gluconate 1 g/ 50 mL sodium chloride IVPB (has no administration in time range)  fentaNYL (SUBLIMAZE) injection 50 mcg (50 mcg Intravenous Given 02/17/2020 1606)       Final Clinical Impression(s) / ED Diagnoses Final diagnoses:  Acute respiratory failure with hypoxia and hypercapnia (HCC)  Anoxic brain injury [facility])    Rx / DC Orders ED Discharge Orders    None       Brantley Fling, MD 02/08/2020 1617    Cathren Laine, MD 03/05/2020 1007

## 2020-02-13 NOTE — Progress Notes (Addendum)
CRITICAL VALUE ALERT  Critical Value:  Lactic Acid 10.5   Date & Time Notied:  1813   Provider Notified: L. Clark  Orders Received/Actions taken: No new orders at this time.

## 2020-02-13 NOTE — Progress Notes (Signed)
Called regarding poor IV access. Patient currently has a working IV and IO line from earlier today. He is on no drips. Given the critical nature of his illness and poor venous access I called his sister Marco Pittman to consent for central line. She is not in favor of central line at this time and would only consent if there are no other options for giving him medications/drawing labs, which at this time is not true considering we can temporarily use IO. She is aware this is only a temporary solution, but feels as though she may favor withdrawing life support in the morning if he has not had meaningful improvements.   IV team consult, one additional IV would be great.  OK to use IO for lab draws   Joneen Roach, AGACNP-BC [facility]  See Amion for personal pager PCCM on call pager [phone]  02/17/2020 7:43 PM

## 2020-02-13 NOTE — H&P (Signed)
NAME:  Marco Pittman, MRN:  [id number], DOB:  1960-09-23, LOS: 0 ADMISSION DATE:  03/14/20, CONSULTATION DATE:  March 14, 2020 REFERRING MD:  EDP, CHIEF COMPLAINT:  Cardiac/respiratory arrest   Brief History   59 y.o. 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  In PEA arrest on EMS arrival, ROSC after approximately 1 hr.   History of present illness   Marco Pittman is a 59 y.o. 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  Pt reportedly had shortness of breath today and presented to primary care requesting a trach change.  This was not done as patient needed a Covid test first. Per notes, pt was sitting on his front porch talking with friends when he collapsed.  Pt was found in PEA arrest and given epix5 and defibrillated x2 before achieving ROSC. Unresponsive on arrival to the ED.  Initial work-up with pH 7.0/44/400/15, elevated LFT's, creatinine 1.7, trop 215, CXR with RUL infiltrate. Janina Mayo was changed from sz 6 cuffless to sz 6 cuffed in the ED and PCCM consulted for admission  Past Medical History   has a past medical history of Aneurysm (HCC), ETOH abuse, Hypertension, Pancreatitis (03/04/2016), and Umbilical hernia. PE   Significant Hospital Events   11/8 Admit  Consults:    Procedures:  11/8 Trach exchange  Significant Diagnostic Tests:  11/8 CXR>>Mild right upper lobe early infiltrate. 11/9 CT head>> Echo>> EEG>>    Sars-CovMicro Data:  11/8 Covid-19 and flu>>negative  Antimicrobials:  Unasyn 11/8-   Interim history/subjective:  As above  Objective   Blood pressure (!) 101/54, pulse (!) 59, temperature (!) 96.1 F (35.6 C), temperature source Temporal, resp. rate 19, height 6\' 1"  (1.854 m), weight 83.4 kg, SpO2 100 %.      Vent Mode: PRVC FiO2 (%):  [60 %-100 %] 60 % Set Rate:  [15 bmp] 15 bmp Vt Set:  [500 mL-630 mL] 630 mL PEEP:  [5 cmH20] 5 cmH20 Plateau Pressure:  [13 cmH20] 13 cmH20  No intake or output data in the 24 hours ending 2020-03-14 1633 Filed Weights   March 14, 2020 1444  Weight: 83.4 kg    General:  Intubated and unresponsive M HEENT: MM pink/moist, trach partially out initially Neuro: unresponsive to pain, no corneal reflexes, pupils 2mm and fixed, is triggering breaths on the vent, muscle fasciculations of the chest wall noted CV: s1s2 rrr, no m/r/g PULM:  Trach changed to cuffed with improved ventilation, decreased air entry bilateral bases without wheezing or rhonchu GI: soft, bsx4 active  Extremities: warm/dry, no edema  Skin: no rashes or lesions   Resolved Hospital Problem list     Assessment & Plan:   Acute, witnessed out of hospital respiratory and cardiac arrest Initially PEA and then defibrillated at some point during resuscitation, prolonged CPR with poor neuro exam on no sedation is worrisome for significant hypoxic injury.  Unclear if etiology was primarily from trach or other underlying cardiac or pulmonary cause P: -Family at bedside and Code status changed to DNR -CT head pending -EEG -Echo -Normothermia protocol -Maintain full vent support with SAT/SBT as tolerated -titrate Vent setting to maintain SpO2 greater than or equal to 90%. -HOB elevated 30 degrees. -Plateau pressures less than 30 cm H20.  -Follow chest x-ray, ABG prn.   -Bronchial hygiene and RT/bronchodilator  protocol. 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 and angioedema, s/p trach  P: -seen by ENT 9/30 Fiberoptic exam of the larynx demonstrates normally mobile vocal folds with good  glottic opening -Trach changed to Shiley s z6 cuffed   AGMA Likely secondary to renal failure and suspect lactic acidosis P: -check lactic acid -1L LR -trend BMP  Elevated Troponin Trop 215 on arrival , EKG with prolonged qtc, minimal ST depressions in inferior leads, however maybe demand as done immediately post-CPR P: -trend trop, repeat EKG in the AM -Echo, may need heparin gtt once head CT done  Elevated Transaminases Baseline normal, does have a history of ETOH abuse, though likely secondary to shock liver P: -Trend LFT's  Acute Kidney Injury, Hyperkalemia Suspect ATN secondary to hypoperfusion, K 6.1. Creatinine 1.7 P: -1 L LR post-CTA, no need for pressors -10 units regular insulin with D5 -repeat BMP this evening -trend UOP, renal indices and avoid nephrotoxins as able     Best practice:  Diet: NPO Pain/Anxiety/Delirium protocol (if indicated): prn Fentanyl VAP protocol (if indicated): HOB 30 degrees, suction prn DVT prophylaxis: SCD's until head CT GI prophylaxis: protonix Glucose control: SSI Mobility: bed rest Code Status: DNR Family Communication: mother and sister updated at the bedside Disposition: ICU  Labs   CBC: Recent Labs  Lab 03/06/2020 1446 03/05/2020 1457 02/08/2020 1513  WBC 6.9  --   --   HGB 10.6* 10.5* 10.2*  HCT 34.6* 31.0* 30.0*  MCV 96.1  --   --   PLT 177  --   --     Basic Metabolic Panel: Recent Labs  Lab 02/17/2020 1446 03/02/2020 1457 02/29/2020 1513  NA 144 143 139  K 6.1* 5.7* 7.2*  CL 107 110  --   CO2 12*  --   --   GLUCOSE 90 82  --   BUN 9 11  --   CREATININE 1.74* 1.50*  --   CALCIUM 9.3  --   --    GFR: Estimated Creatinine Clearance: 58.4 mL/min (A) (by C-G formula based on SCr of 1.5 mg/dL (H)). Recent Labs  Lab 02/10/2020 1446  WBC 6.9    Liver Function Tests: Recent Labs  Lab 03/03/2020 1446  AST PENDING  ALT PENDING  ALKPHOS 86  BILITOT 0.9  PROT 7.1  ALBUMIN 3.3*   No results for input(s):  LIPASE, AMYLASE in the last 168 hours. No results for input(s): AMMONIA in the last 168 hours.  ABG    Component Value Date/Time   PHART 7.097 (LL) 03/05/2020 1513   PCO2ART 44.2 02/23/2020 1513   PO2ART 400 (H) 03/04/2020 1513   HCO3 13.9 (L) 02/12/2020 1513   TCO2 15 (L) 02/12/2020 1513   ACIDBASEDEF 15.0 (H) 02/13/2020 1513   O2SAT 100.0 02/13/2020 1513     Coagulation Profile: No results for input(s): INR, PROTIME in the last 168 hours.  Cardiac Enzymes: No results for input(s): CKTOTAL, CKMB, CKMBINDEX, TROPONINI in the last 168 hours.  HbA1C: Hemoglobin A1C  Date/Time Value Ref Range Status  05/05/2018 09:12 AM 5.9 (A) 4.0 - 5.6 % Final  10/10/2016 09:20 AM 6.1  Final   Hgb A1c MFr Bld  Date/Time Value Ref Range Status  10/18/2019 03:09 PM 5.9 (H) 4.8 - 5.6 % Final    Comment:  Prediabetes: 5.7 - 6.4          Diabetes: >6.4          Glycemic control for adults with diabetes: <7.0   03/05/2016 05:43 AM 5.9 (H) 4.8 - 5.6 % Final    Comment:    (NOTE)         Pre-diabetes: 5.7 - 6.4         Diabetes: >6.4         Glycemic control for adults with diabetes: <7.0     CBG: No results for input(s): GLUCAP in the last 168 hours.  Review of Systems:   Unable to obtain  Past Medical History  He,  has a past medical history of Aneurysm (HCC), ETOH abuse, Hypertension, Pancreatitis (03/04/2016), and Umbilical hernia.   Surgical History    Past Surgical History:  Procedure Laterality Date  . GASTROSTOMY N/A 11/16/2019   Procedure: INSERTION OF GASTROSTOMY TUBE,;  Surgeon: Diamantina Monks, MD;  Location: [facility] OR;  Service: General;  Laterality: N/A;  . INSERTION OF MESH N/A 04/14/2016   Procedure: INSERTION OF MESH;  Surgeon: Avel Peace, MD;  Location: [facility];  Service: General;  Laterality: N/A;  . MICROLARYNGOSCOPY N/A 11/09/2019   Procedure: MICROLARYNGOSCOPY;  Surgeon: [name], MD;  Location: [facility] OR;  Service: ENT;   Laterality: N/A;  . TRACHEOSTOMY TUBE PLACEMENT N/A 11/09/2019   Procedure: AWAKE TRACHEOSTOMY;  Surgeon: [name], MD;  Location: [facility] OR;  Service: ENT;  Laterality: N/A;  . UMBILICAL HERNIA REPAIR N/A 04/14/2016   Procedure: UMBILICAL HERNIA REPAIR WITH MESH;  Surgeon: Avel Peace, MD;  Location: [facility];  Service: General;  Laterality: N/A;     Social History   reports that he has been smoking cigars. He has a 22.50 pack-year smoking history. He has never used smokeless tobacco. He reports current alcohol use of about 14.0 standard drinks of alcohol per week. He reports that he does not use drugs.   Family History   His family history includes Cancer (age of onset: 62) in his father.   Allergies Allergies  Allergen Reactions  . Lisinopril Swelling    Angioedema     Home Medications  Prior to Admission medications   Medication Sig Start Date End Date Taking? Authorizing Provider  amLODipine (NORVASC) 10 MG tablet Take 10 mg by mouth daily.   Yes [provider]  apixaban (ELIQUIS) 5 MG TABS tablet Place 1 tablet (5 mg total) into feeding tube 2 (two) times daily. 12/10/19  Yes Meredeth Ide, MD  carvedilol (COREG) 3.125 MG tablet Place 1 tablet (3.125 mg total) into feeding tube 2 (two) times daily with a meal. 12/10/19  Yes Sharl Ma, Sarina Ill, MD  glycopyrrolate (ROBINUL) 1 MG tablet Take 1 tablet (1 mg total) by mouth 3 (three) times daily as needed (Heavy secretions). 12/10/19  Yes Sharl Ma, Sarina Ill, MD  guaiFENesin (ROBITUSSIN) 100 MG/5ML SOLN Place 10 mLs (200 mg total) into feeding tube every 4 (four) hours as needed for cough or to loosen phlegm. 12/10/19  Yes Meredeth Ide, MD  Multiple Vitamin (MULTIVITAMIN WITH MINERALS) TABS tablet Place 1 tablet into feeding tube daily. 11/17/19  Yes Almon Hercules, MD  ursodiol (ACTIGALL) 500 MG tablet Place 1 tablet (500 mg total) into feeding tube 3 (three) times daily. 11/17/19  Yes Almon Hercules, MD  thiamine 100 MG tablet  Place 1 tablet (100 mg total) into feeding tube daily. Patient not taking:  Reported on 02/17/2020 11/17/19   Almon Hercules, MD     Critical care time: 50 minutes     CRITICAL CARE Performed by: Darcella Gasman Lakeyshia Tuckerman   Total critical care time: 50 minutes  Critical care time was exclusive of separately billable procedures and treating other patients.  Critical care was necessary to treat or prevent imminent or life-threatening deterioration.  Critical care was time spent personally by me on the following activities: development of treatment plan with patient and/or surrogate as well as nursing, discussions with consultants, evaluation of patient's response to treatment, examination of patient, obtaining history from patient or surrogate, ordering and performing treatments and interventions, ordering and review of laboratory studies, ordering and review of radiographic studies, pulse oximetry and re-evaluation of patient's condition.  Darcella Gasman Keliyah Spillman, PA-C [place] [facility]  Pager# [phone], if no answer [phone]

## 2020-02-13 NOTE — Progress Notes (Signed)
Hypoglycemic Event  CBG: 14  Treatment: 1 Amp D50  Symptoms: None  Follow-up CBG: Time: 1909 CBG Result:88  Possible Reasons for Event: NPO   Comments/MD notified: L. Chestine Spore, MD notified.    Darryl Nestle

## 2020-02-13 NOTE — Progress Notes (Signed)
Stat  EEG complete - results pending.  

## 2020-02-13 NOTE — ED Notes (Signed)
Brother [phone] Marco Pittman

## 2020-02-13 NOTE — Progress Notes (Signed)
Pharmacy Antibiotic Note  Marco Pittman is a 59 y.o. male admitted on 03/01/2020 with aspiration pneumonia.  Pharmacy has been consulted for Unasyn dosing.  Plan: Unasyn 3g IV q6h Monitor clinical progress   Height: 6\' 1"  (185.4 cm) Weight: 83.4 kg (183 lb 13.8 oz) IBW/kg (Calculated) : 79.9  Temp (24hrs), Avg:96.1 F (35.6 C), Min:96.1 F (35.6 C), Max:96.1 F (35.6 C)  Recent Labs  Lab 02/29/2020 1446 03/04/2020 1457 02/22/2020 1715  WBC 6.9  --   --   CREATININE 1.74* 1.50*  --   LATICACIDVEN  --   --  10.5*    Estimated Creatinine Clearance: 58.4 mL/min (A) (by C-G formula based on SCr of 1.5 mg/dL (H)).    Allergies  Allergen Reactions  . Lisinopril Swelling    Angioedema    Antimicrobials this admission: Unasyn 11/8 >>   Dose adjustments this admission:   Microbiology results:   Thank you for allowing pharmacy to be a part of this patient's care.  13/8, PharmD PGY1 Pharmacy Resident 02/20/2020 5:58 PM  Please check [email or web address] for unit-specific pharmacy phone numbers.

## 2020-02-13 NOTE — Progress Notes (Signed)
eLink Physician-Brief Progress Note Patient Name: Marco Pittman DOB: Oct 23, 1960 MRN: [id number]   Date of Service  2020-02-21  HPI/Events of Note  Bloody watery stool NGT in place without bloody output CT without PE  eICU Interventions  Ok OG use Hold Eliquis Continue to monitor H/H, so far its been stable     Intervention Category Major Interventions: Hemorrhage - evaluation and management  Darl Pikes 02-21-2020, 11:13 PM

## 2020-02-13 NOTE — ED Notes (Signed)
[phone] [name] sister would like an update

## 2020-02-13 NOTE — Progress Notes (Signed)
Notified Dr. Violet Baldy of bloody stool, low UOP, and confirmed hold Eliquis.

## 2020-02-13 NOTE — Plan of Care (Signed)
Discussed patient's care with his sister and mother at bedside in the ED. They understand he had a prolonged downtime of about 1 hour and see that he has constant fasciculations that likely indicates severe brain damage. His sister indicated that if he could not recover he would not want prolonged MV and if he codes again we should not attempt additional resuscitation. Bedside RN present throughout discussion. Code status updated in the chart.  Steffanie Dunn, DO 02/06/2020 4:34 PM [facility]

## 2020-02-13 NOTE — Progress Notes (Signed)
Patient transported from 3M06 to CT and back with no complications.

## 2020-02-14 ENCOUNTER — Inpatient Hospital Stay (HOSPITAL_COMMUNITY): Payer: Medicaid Other

## 2020-02-14 LAB — POCT I-STAT 7, (LYTES, BLD GAS, ICA,H+H)
Acid-base deficit: 15 mmol/L — ABNORMAL HIGH (ref 0.0–2.0)
Bicarbonate: 13.9 mmol/L — ABNORMAL LOW (ref 20.0–28.0)
Calcium, Ion: 1.16 mmol/L (ref 1.15–1.40)
HCT: 30 % — ABNORMAL LOW (ref 39.0–52.0)
Hemoglobin: 10.2 g/dL — ABNORMAL LOW (ref 13.0–17.0)
O2 Saturation: 100 %
Patient temperature: 96.1
Potassium: 7.2 mmol/L (ref 3.5–5.1)
Sodium: 139 mmol/L (ref 135–145)
TCO2: 15 mmol/L — ABNORMAL LOW (ref 22–32)
pCO2 arterial: 44.2 mmHg (ref 32.0–48.0)
pH, Arterial: 7.097 — CL (ref 7.350–7.450)
pO2, Arterial: 400 mmHg — ABNORMAL HIGH (ref 83.0–108.0)

## 2020-02-14 LAB — URINALYSIS, ROUTINE W REFLEX MICROSCOPIC
Bilirubin Urine: NEGATIVE
Glucose, UA: 150 mg/dL — AB
Ketones, ur: NEGATIVE mg/dL
Leukocytes,Ua: NEGATIVE
Nitrite: NEGATIVE
Protein, ur: 100 mg/dL — AB
Specific Gravity, Urine: 1.012 (ref 1.005–1.030)
pH: 7 (ref 5.0–8.0)

## 2020-02-14 LAB — RAPID URINE DRUG SCREEN, HOSP PERFORMED
Amphetamines: NOT DETECTED
Barbiturates: NOT DETECTED
Benzodiazepines: NOT DETECTED
Cocaine: NOT DETECTED
Opiates: NOT DETECTED
Tetrahydrocannabinol: NOT DETECTED

## 2020-02-15 ENCOUNTER — Ambulatory Visit (HOSPITAL_COMMUNITY): Payer: Medicaid Other

## 2020-02-15 ENCOUNTER — Telehealth: Payer: Self-pay | Admitting: Internal Medicine

## 2020-02-15 NOTE — Telephone Encounter (Signed)
Phone call placed to pt's sister Jaymere Alen this a.m to give my condolences to her and her family.  She was currently at the funeral home with his friend Clarks.  I asked about events leading up to his death.  Doristine Church stated that pt had started feeling SOB on 02/12/2020.  He took him to the trach clinic the next day to get trach looked at and changed.  They did not have appt that day but he was scheduled an appt for later this mth.  He was advised by them to go to ER if he felt his breathing was not good but pt did not want to deal with long wait in ER so he did not go.  He returned home with [place] but breathing became worse so [place] called EMS for him.  I empathize with Deniece Portela as events were traumatic for both pt and himself.  I extended my condolences to his friends and family.

## 2020-02-20 ENCOUNTER — Ambulatory Visit: Payer: Medicaid Other

## 2020-02-20 ENCOUNTER — Other Ambulatory Visit (HOSPITAL_COMMUNITY): Payer: Medicaid Other

## 2020-02-21 ENCOUNTER — Telehealth: Payer: Medicaid Other | Admitting: Internal Medicine

## 2020-02-22 ENCOUNTER — Ambulatory Visit (HOSPITAL_COMMUNITY): Payer: Medicaid Other

## 2020-03-07 NOTE — Progress Notes (Signed)
eLink Physician-Brief Progress Note Patient Name: Marco Pittman DOB: [birth year] MRN: [id number]   Date of Service  February 19, 2020  HPI/Events of Note  Notified of vent desynchrony Tachycardic 120s, temp 100.8 given Tylenol  eICU Interventions  Trial of switch to pressure AC Already receiving prn Fentanyl and Versed     Intervention Category Major Interventions: Respiratory failure - evaluation and management  Darl Pikes 2020/02/19, 12:19 AM

## 2020-03-07 NOTE — Progress Notes (Signed)
Patient body prepped for morgue. Confirmed with hospital [facility] okay to remove lines and drains from patient during prep as patient is private autopsy not medical examiner case.

## 2020-03-07 NOTE — Progress Notes (Signed)
eLink Physician-Brief Progress Note Patient Name: Marco Pittman DOB: [birth year] MRN: [id number]   Date of Service  02/24/2020  HPI/Events of Note  Notified that patient went into asystole  eICU Interventions  I was able to speak with patient's sister [name] over the phone and informed her of event     Intervention Category Minor Interventions: Other:;Communication with other healthcare providers and/or family  Darl Pikes 02/24/2020, 1:15 AM

## 2020-03-07 NOTE — Progress Notes (Signed)
Notified Dr. Violet Baldy of narrow pulse pressure on BP cuff.

## 2020-03-07 NOTE — Progress Notes (Signed)
RT changed ventilator mode to Pressure Control per Violet Baldy, MD.  Patient is on pressure control of 12cmH2O / 5cmH2O PEEP. VT and Mve match previous measurements on PRVC. Patient appears to be tolerating settings well at this time. RT will continue to monitor as needed.

## 2020-03-07 NOTE — Death Summary Note (Signed)
Physician Discharge Summary  Patient ID: Marco Pittman MRN: [id number] DOB/AGE: [birth year] 59 y.o.  Admit date: 02/15/2020 Discharge date: 03-04-20  Admission Diagnoses: Cardiac arrest Acute respiratory failure with hypoxia Anoxic encephalopathy Acute metabolic acidosis Lactic acidosis Hyperkalemia Elevated transaminase level Elevated troponin  Discharge Diagnoses:  Active Problems:   Cardiac arrest [facility]) Cardiac arrest Acute respiratory failure with hypoxia Anoxic encephalopathy Acute metabolic acidosis Lactic acidosis Hyperkalemia Elevated transaminase level Elevated troponin Hyperglycemia AKI   Discharged Condition: deceased  Hospital Course: Marco Pittman is a 59 y/o gentleman who was admitted for acute respiratory failure and cardiac arrest. No definitive cause of cardiac arrest was found but was presumed to be respiratory in origin given complaint of SOB. He had his trach changed to a cuffed trach in the ED to facilitate adequate ventilation. Hypercapnia resolved. His fasciculations were evaluated by EEG which did not demonstrate obvious seizures. He developed progressive multiorgan failure and developed shock and quickly had a cardiac arrest. His family was notified that he had expired. Time of death 01:08 on March 04, 2020.  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 Episodes of left facial/cheek twitching were noted without concomitant eeg change. However, focal motor seizures may not be seen on scalp eeg. Therefore, clinical correlation is recommended. No epileptiform discharges were seen throughout the recording.  CTA PE: Scattered pulmonary nodules, emphysema, tracheostomy tube in place. No PE. Scar from previous PE in lower lobes.   CT head: no abnormalities  LA 2>10.5>  7.4   Treatments: Mechanical ventilation Antibiotics anticoagulation EEG monitoring bicarbonate   Disposition: ME case     Signed: Steffanie Dunn March 04, 2020, 3:37 PM

## 2020-03-07 NOTE — Progress Notes (Signed)
Notified E-link patient had low BP 76/30 as well as heart rhythm change at 0102. At 0108 Patient went into asystole, notified E-link, patient is DNR.

## 2020-03-07 DEATH — deceased

## 2020-07-07 ENCOUNTER — Other Ambulatory Visit: Payer: Self-pay

## 2022-05-23 IMAGING — DX DG CHEST 1V PORT
1 series · 1 of 1 positions shown · non-contrast
Comparison: None.

CLINICAL DATA: Tube placement.

EXAM:
PORTABLE CHEST 1 VIEW

[chest]
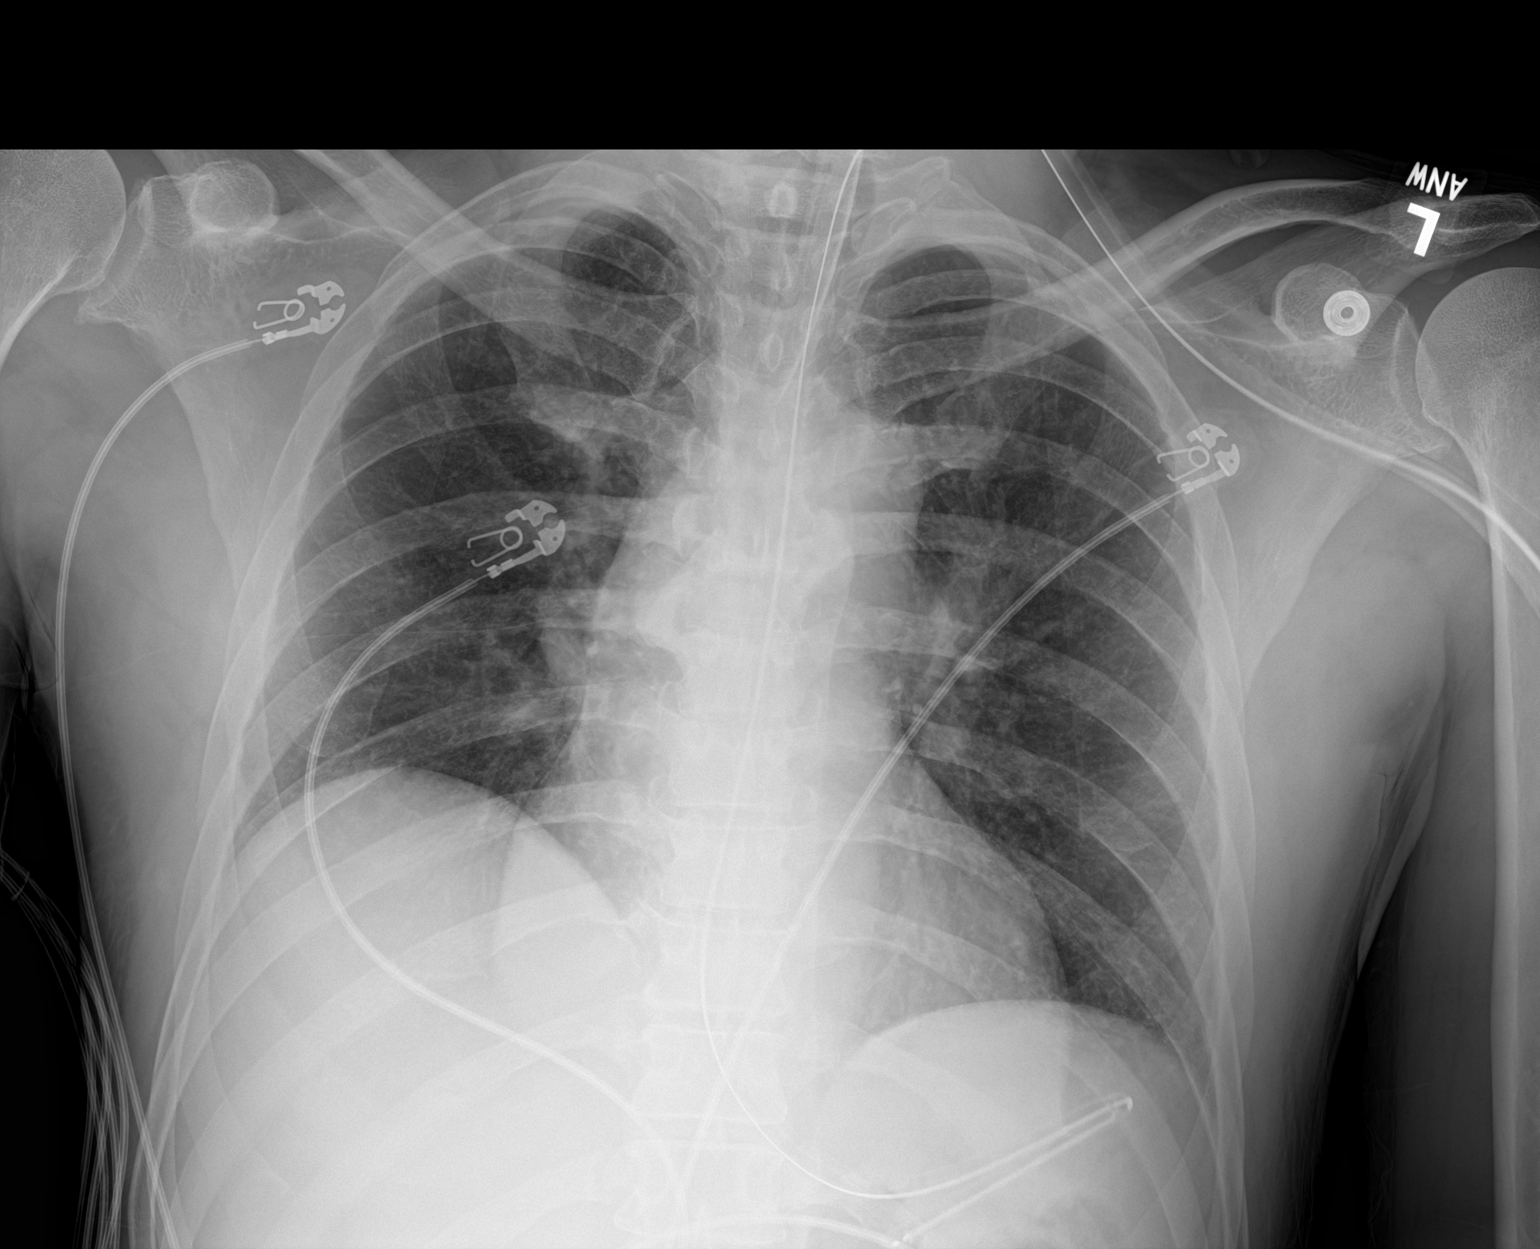

[1 of 1 positions shown; findings below may reference images not displayed]

FINDINGS: 6036 hours. Endotracheal tube tip is approximately 3.4 cm above the
base of the carina. The NG tube passes into the stomach although the
distal tip position is not included on the film. Asymmetric
elevation right hemidiaphragm. The lungs are clear without focal
pneumonia, edema, pneumothorax or pleural effusion.
Cardiopericardial silhouette is at upper limits of normal for size.
The visualized bony structures of the thorax show now acute
abnormality. Telemetry leads overlie the chest.
IMPRESSION: Endotracheal tube tip is 3.4 cm above the base of the carina.

## 2022-06-03 IMAGING — DX DG CHEST 1V PORT
1 series · 1 of 1 positions shown · non-contrast
Comparison: 10/30/2019

CLINICAL DATA: Post intubation

EXAM:
PORTABLE CHEST 1 VIEW

[chest ap]
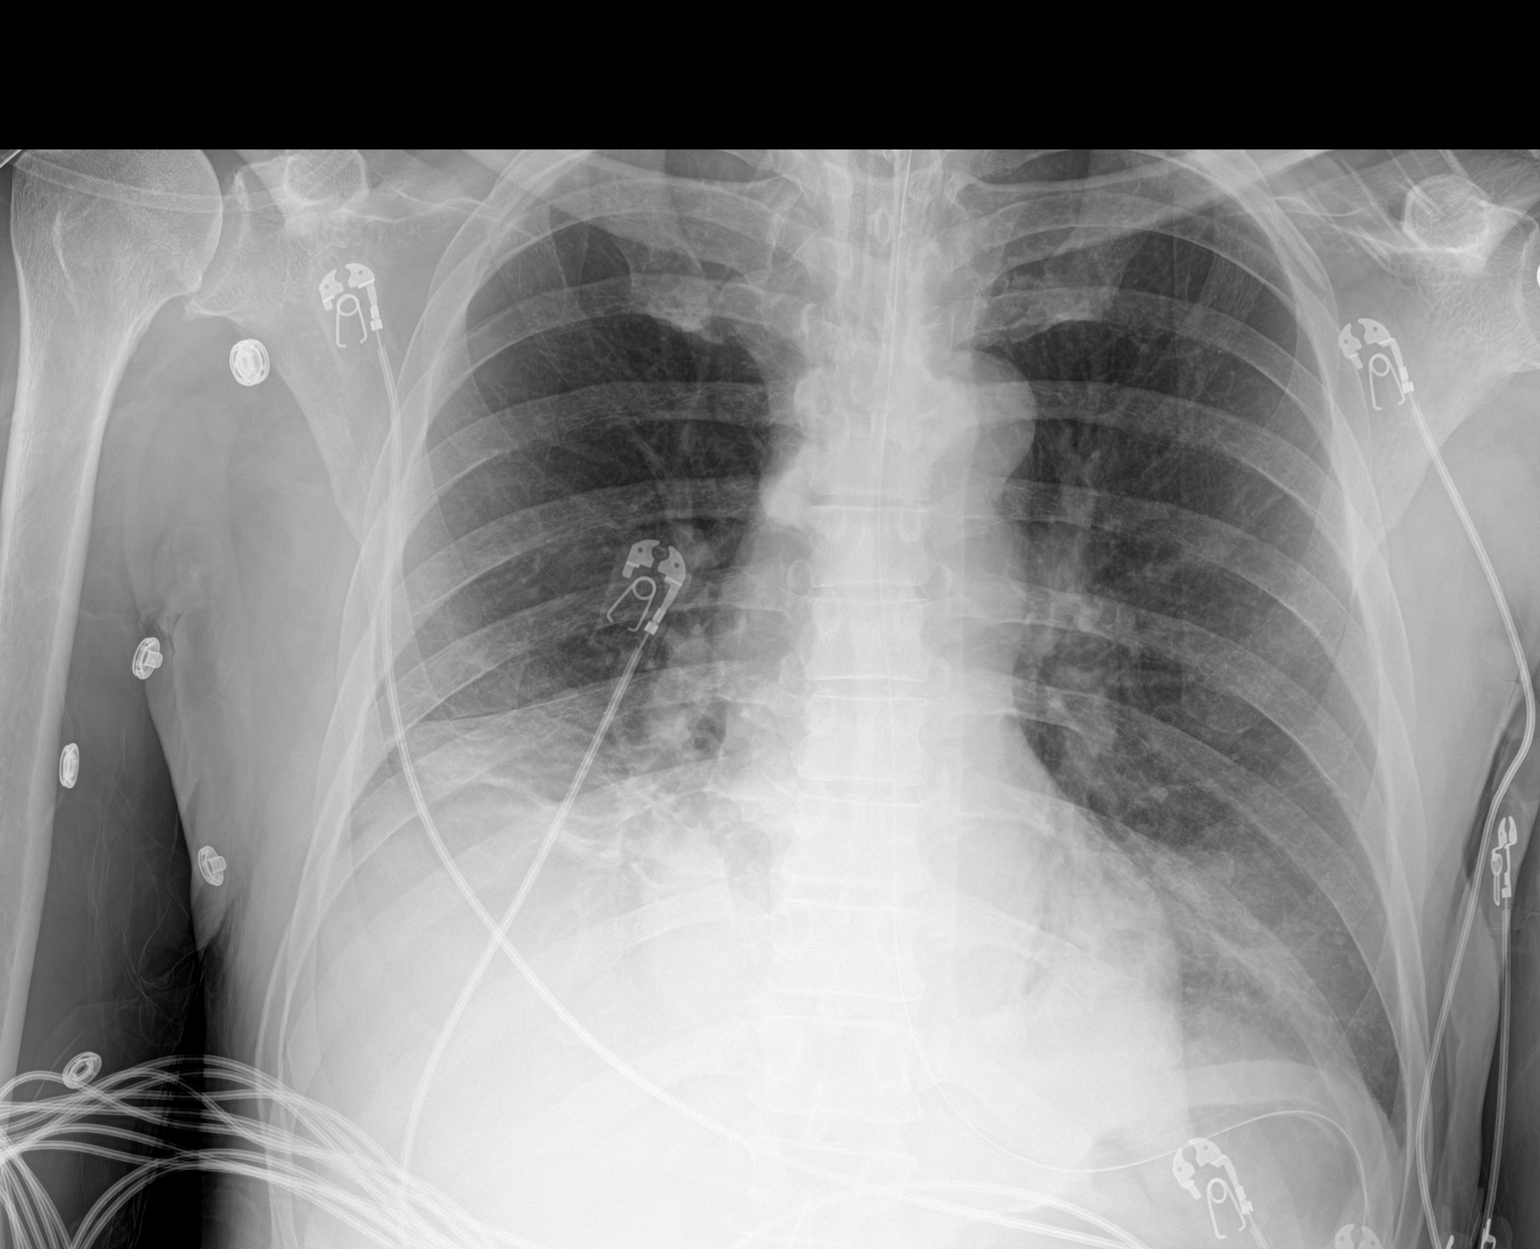

[1 of 1 positions shown; findings below may reference images not displayed]

FINDINGS: Interval placement of endotracheal tube, tip projecting over the mid
trachea. Interval placement of esophagogastric tube, tip and side
port below the diaphragm. Heterogeneous airspace opacity of the
bilateral lung bases, possibly with small associated layering
pleural effusions. Heart and mediastinum are normal.
IMPRESSION: 1. Interval placement of endotracheal tube, tip projecting over the
mid trachea.
2. Interval placement of esophagogastric tube, tip and side port
below the diaphragm.
3. Heterogeneous airspace opacity of the bilateral lung bases,
possibly with small associated layering pleural effusions, in
keeping with infection or aspiration.

## 2022-06-04 IMAGING — DX DG CHEST 1V PORT
1 series · 1 of 1 positions shown · non-contrast
Comparison: 10/31/2019.

CLINICAL DATA: Intubation.

EXAM:
PORTABLE CHEST 1 VIEW

[chest ap]
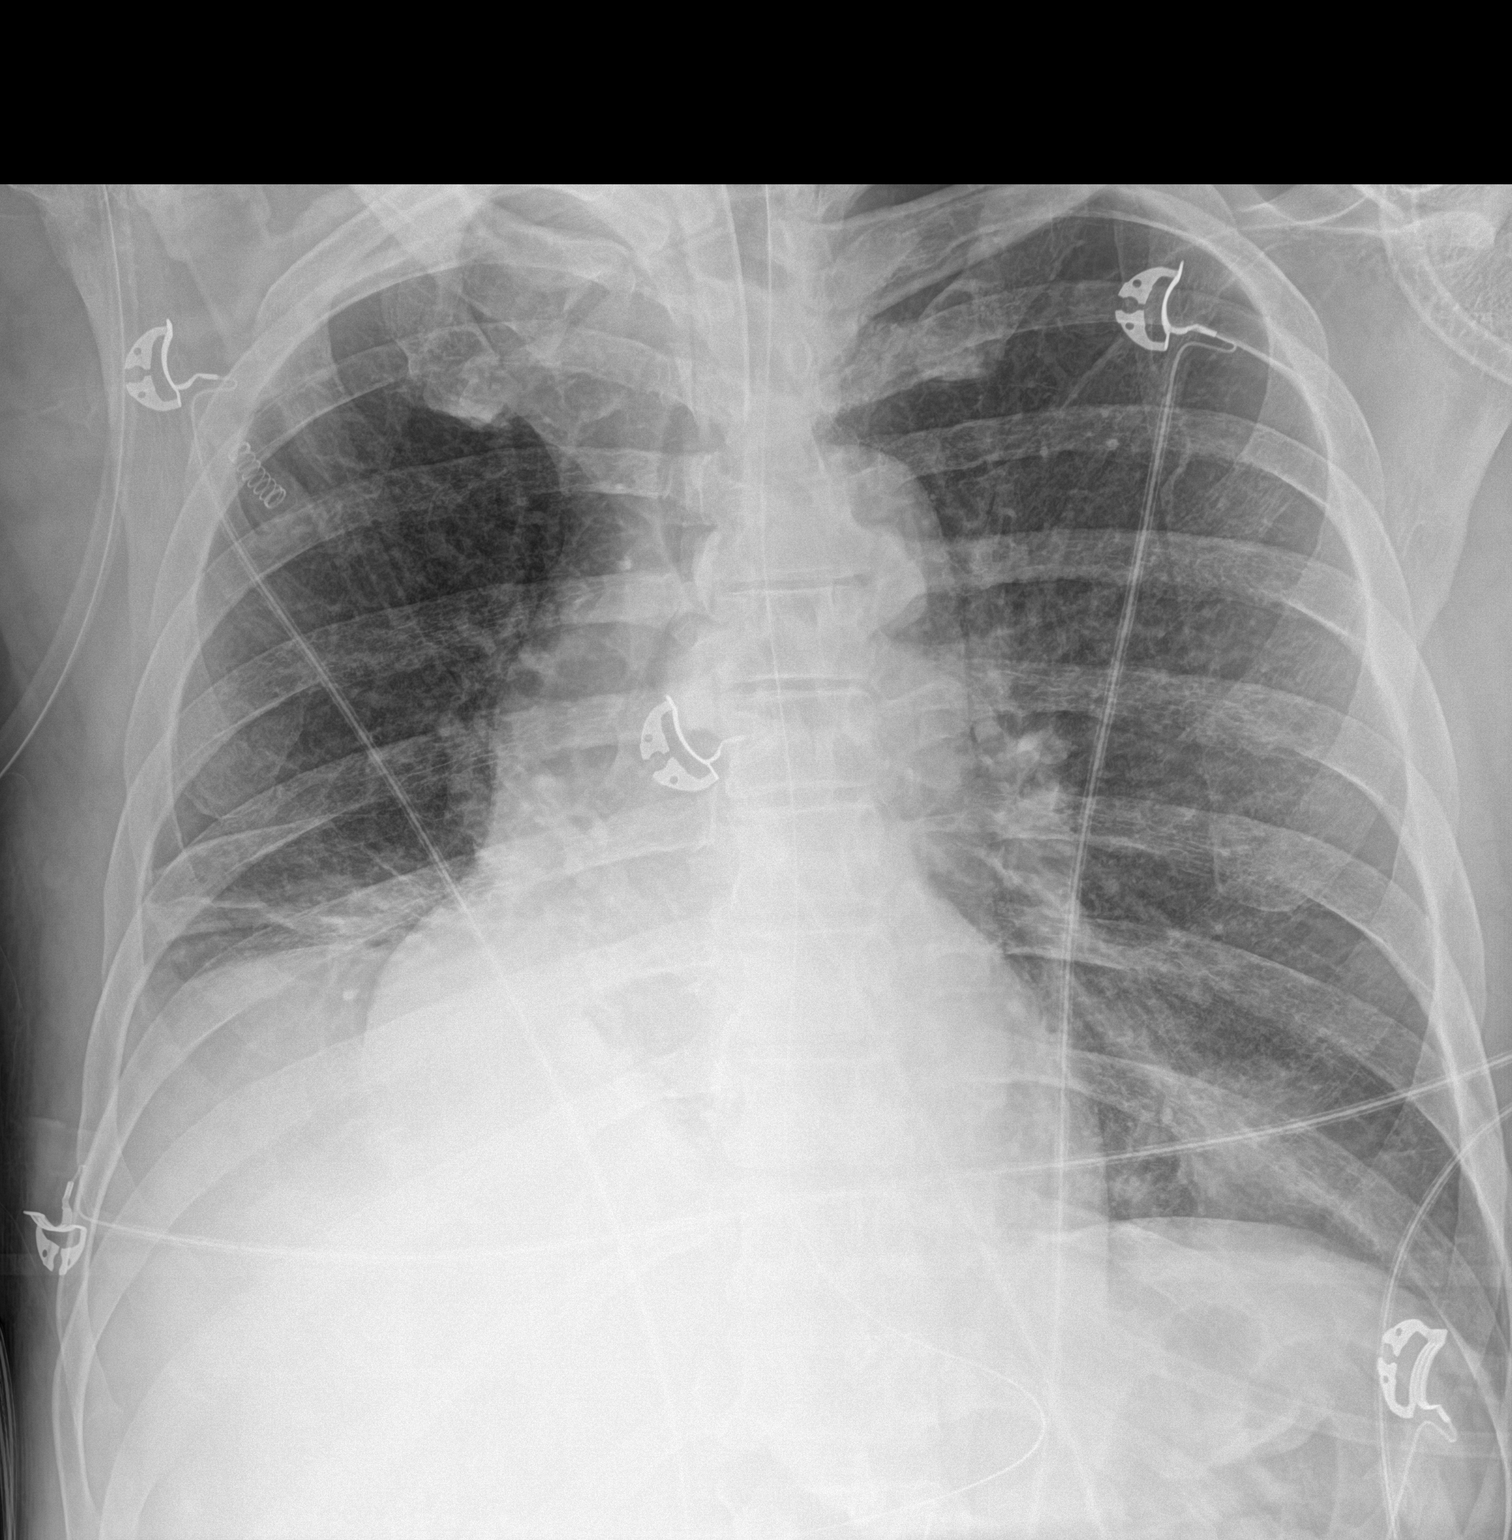

[1 of 1 positions shown; findings below may reference images not displayed]

FINDINGS: Endotracheal tube and NG tube in stable position. Cardiomegaly with
mild pulmonary venous congestion. Mild bilateral interstitial
prominence. Mild CHF cannot be excluded. Persistent bibasilar
atelectasis. Mild left base infiltrate cannot be excluded. No
pleural effusion or pneumothorax. Stable elevation right
hemidiaphragm.
IMPRESSION: 1.  Endotracheal tube and NG tube in stable position.

2. Cardiomegaly with mild pulmonary venous congestion and mild
bilateral interstitial prominence suggesting mild CHF.

3. Persistent bibasilar atelectasis. Left base infiltrate cannot be
excluded.
